# Patient Record
Sex: Male | Born: 1960 | Race: White | Hispanic: No | Marital: Single | Smoking: Former smoker
Health system: Southern US, Community
[De-identification: ages and names within clinical notes are randomized; demographics above are authoritative.]

## PROBLEM LIST (undated history)

## (undated) DIAGNOSIS — N320 Bladder-neck obstruction: Secondary | ICD-10-CM

## (undated) DIAGNOSIS — T7840XA Allergy, unspecified, initial encounter: Secondary | ICD-10-CM

## (undated) DIAGNOSIS — H269 Unspecified cataract: Secondary | ICD-10-CM

## (undated) DIAGNOSIS — I2510 Atherosclerotic heart disease of native coronary artery without angina pectoris: Secondary | ICD-10-CM

## (undated) DIAGNOSIS — H3552 Pigmentary retinal dystrophy: Secondary | ICD-10-CM

## (undated) DIAGNOSIS — I509 Heart failure, unspecified: Secondary | ICD-10-CM

## (undated) DIAGNOSIS — C801 Malignant (primary) neoplasm, unspecified: Secondary | ICD-10-CM

## (undated) DIAGNOSIS — E785 Hyperlipidemia, unspecified: Secondary | ICD-10-CM

## (undated) DIAGNOSIS — I219 Acute myocardial infarction, unspecified: Secondary | ICD-10-CM

## (undated) DIAGNOSIS — J439 Emphysema, unspecified: Secondary | ICD-10-CM

## (undated) DIAGNOSIS — Z5189 Encounter for other specified aftercare: Secondary | ICD-10-CM

## (undated) DIAGNOSIS — I10 Essential (primary) hypertension: Secondary | ICD-10-CM

## (undated) HISTORY — DX: Malignant (primary) neoplasm, unspecified: C80.1

## (undated) HISTORY — DX: Pigmentary retinal dystrophy: H35.52

## (undated) HISTORY — DX: Unspecified cataract: H26.9

## (undated) HISTORY — DX: Emphysema, unspecified: J43.9

## (undated) HISTORY — DX: Allergy, unspecified, initial encounter: T78.40XA

## (undated) HISTORY — DX: Bladder-neck obstruction: N32.0

## (undated) HISTORY — DX: Acute myocardial infarction, unspecified: I21.9

## (undated) HISTORY — PX: CORONARY ANGIOPLASTY: SHX604

## (undated) HISTORY — PX: EYE SURGERY: SHX253

## (undated) HISTORY — DX: Encounter for other specified aftercare: Z51.89

## (undated) HISTORY — DX: Hyperlipidemia, unspecified: E78.5

## (undated) HISTORY — DX: Essential (primary) hypertension: I10

## (undated) HISTORY — PX: SPINE SURGERY: SHX786

## (undated) HISTORY — PX: CARDIAC CATHETERIZATION: SHX172

---
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
## 1999-09-08 ENCOUNTER — Encounter: Admission: RE | Admit: 1999-09-08 | Discharge: 1999-09-08 | Payer: Self-pay | Admitting: Gastroenterology

## 1999-09-08 ENCOUNTER — Encounter: Payer: Self-pay | Admitting: Gastroenterology

## 2000-01-20 ENCOUNTER — Observation Stay (HOSPITAL_COMMUNITY): Admission: EM | Admit: 2000-01-20 | Discharge: 2000-01-20 | Payer: Self-pay | Admitting: Emergency Medicine

## 2000-04-22 ENCOUNTER — Ambulatory Visit (HOSPITAL_COMMUNITY): Admission: RE | Admit: 2000-04-22 | Discharge: 2000-04-22 | Payer: Self-pay | Admitting: Gastroenterology

## 2002-03-23 HISTORY — PX: CATARACT EXTRACTION: SUR2

## 2010-06-30 ENCOUNTER — Emergency Department (HOSPITAL_COMMUNITY): Payer: BC Managed Care – HMO

## 2010-06-30 ENCOUNTER — Emergency Department (HOSPITAL_COMMUNITY)
Admission: EM | Admit: 2010-06-30 | Discharge: 2010-06-30 | Disposition: A | Discharge status: Home / Self Care | Payer: BC Managed Care – HMO | Attending: Emergency Medicine | Admitting: Emergency Medicine

## 2010-06-30 DIAGNOSIS — E669 Obesity, unspecified: Secondary | ICD-10-CM | POA: Insufficient documentation

## 2010-06-30 DIAGNOSIS — R079 Chest pain, unspecified: Secondary | ICD-10-CM

## 2010-06-30 DIAGNOSIS — R0602 Shortness of breath: Secondary | ICD-10-CM | POA: Insufficient documentation

## 2010-06-30 DIAGNOSIS — J3489 Other specified disorders of nose and nasal sinuses: Secondary | ICD-10-CM | POA: Insufficient documentation

## 2010-06-30 DIAGNOSIS — R05 Cough: Secondary | ICD-10-CM | POA: Insufficient documentation

## 2010-06-30 DIAGNOSIS — R209 Unspecified disturbances of skin sensation: Secondary | ICD-10-CM | POA: Insufficient documentation

## 2010-06-30 DIAGNOSIS — R059 Cough, unspecified: Secondary | ICD-10-CM | POA: Insufficient documentation

## 2010-06-30 LAB — PROTIME-INR
INR: 0.92 (ref 0.00–1.49)
Prothrombin Time: 12.6 seconds (ref 11.6–15.2)

## 2010-06-30 LAB — CK TOTAL AND CKMB (NOT AT ARMC)
CK, MB: 1.5 ng/mL (ref 0.3–4.0)
Relative Index: 0.5 (ref 0.0–2.5)
Total CK: 329 U/L — ABNORMAL HIGH (ref 7–232)

## 2010-06-30 LAB — COMPREHENSIVE METABOLIC PANEL
ALT: 40 U/L (ref 0–53)
Albumin: 3.7 g/dL (ref 3.5–5.2)
Alkaline Phosphatase: 102 U/L (ref 39–117)
GFR calc Af Amer: >60 mL/min (ref >60)
Potassium: 4.2 mEq/L (ref 3.5–5.1)
Sodium: 137 mEq/L (ref 135–145)
Total Protein: 6.9 g/dL (ref 6.0–8.3)

## 2010-06-30 LAB — POCT CARDIAC MARKERS
CKMB, poc: <1 ng/mL — ABNORMAL LOW (ref 1.0–8.0)
Myoglobin, poc: 77.6 ng/mL (ref 12–200)
Troponin i, poc: <0.05 ng/mL (ref 0.00–0.09)

## 2010-06-30 LAB — CBC
Platelets: 180 10*3/uL (ref 150–400)
RDW: 14.5 % (ref 11.5–15.5)
WBC: 12.2 10*3/uL — ABNORMAL HIGH (ref 4.0–10.5)

## 2010-06-30 LAB — TROPONIN I: Troponin I: 0.02 ng/mL (ref 0.00–0.06)

## 2010-06-30 LAB — APTT: aPTT: 26 seconds (ref 24–37)

## 2010-07-01 NOTE — Consult Note (Addendum)
NAMEMELQUIADES, KOVAR                  ACCOUNT NO.:  [id number]  MEDICAL RECORD NO.:  [id number]           PATIENT TYPE:  E  LOCATION:  [facility]                         FACILITY:  [facility]  PHYSICIAN:  Veverly Fells. Excell Seltzer, MD  DATE OF BIRTH:  1960-11-02  DATE OF CONSULTATION: DATE OF DISCHARGE:  06/30/2010                                CONSULTATION   PRIMARY CARDIOLOGIST:  [facility].  PRIMARY MEDICAL DOCTOR:  [facility].  CHIEF COMPLAINT:  Chest pain.  HISTORY OF PRESENT ILLNESS:  Mr. Novosel is a 50 year old gentleman with no prior known coronary artery disease as well as obesity and high cholesterol who reportedly had a stress test recently in 2008 in [place], [place].  He was told he needed a cath, but never went.  He had a nuclear study in 2001 here at [facility] that was negative with the normal EF.  He presents to [facility] with complaints of somewhat atypical chest pain.  He has had a tightness in the chest for several days, which he initially attributed to his usual bronchitis. Feels the same as his usual allergy symptom, associated with sneezing and coughing, but today he had some numbness and tingling in his left arm.  He has a chest tightness 24 hours a day.  It does not come on with exertion and is not made worst by exertion.  It is not particularly associated with any shortness of breath.  Now, he has a right-sided sharp tight chest discomfort.  EKG is without acute changes.  Cardiac enzymes are negative x1.  PAST MEDICAL HISTORY: 1. 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 2. Obesity. 3. Seasonal allergies. 4. Hyperlipidemia. 5. Normal EGD with hiatal hernia in 2002.  OUTPATIENT MEDICATIONS:  Only p.r.n. Tylenol.  ALLERGIES:  No known drug allergies.  SOCIAL HISTORY:  Mr. Cahoon lives in [place].  He is not married and does not have children.  He works as a Building control surveyor.  He has smoked for approximately 25 years in total and  smoked consistently last 10 years.  He drinks rare alcohol.  He is leaving to go on a trip in approximately 10 days and just want to make sure that this chest pain would not keep him from going.  FAMILY HISTORY:  His father had some sort of heart trouble in his late 36s who died of suicide at age 49.  He has one brother in good health. He has grandparents who did have some sort of heart problems.  REVIEW OF SYSTEMS:  No fevers.  Positive for occasional chills, occasional cough, nausea, vomiting, diarrhea, bright red blood per rectum, melena, or hematemesis.  He worked on losing weight over last year having lost 60 pounds, but recently gained about 20 of it back.  RADIOLOGY:  WBC 12.2, hemoglobin 15.7, hematocrit 43.8, platelet count 180.  Sodium 137, potassium 4.8, chloride 105, CO2 25, glucose 86, BUN 8, creatinine 0.82.  LFTs were within normal limits.  PT/INR normal. Cardiac enzymes negative x1.  STUDIES:  Chest x-ray showed no acute disease.  PHYSICAL EXAMINATION:  VITAL SIGNS:  Temperature  98.3, pulse 47-52, respirations 15, blood pressure 93/60, pulse ox 95% on room air. GENERAL:  This is a pleasant white male, in no acute distress. HEENT:  Normocephalic, atraumatic with extraocular movements intact and clear sclera.  Nares are without discharge. NECK:  Supple without carotid bruit. HEART:  Auscultation of heart reveals regular rate and rhythm without murmurs, rubs, or gallops. LUNGS:  Clear to auscultation bilaterally without wheezes, rales, or rhonchi. ABDOMEN:  Soft, nontender, nondistended with positive bowel sounds. EXTREMITIES:  Warm and dry without edema. NEUROLOGIC:  He is alert and oriented x3, responds to questions appropriately with a normal affect.  ASSESSMENT/PLAN:  The patient was seen and examined by Dr. Excell Seltzer.  This is a 50 year old gentleman with a history of hyperlipidemia, tobacco abuse, and obesity who presents with atypical chest pain, which  she relates as a constant left-sided chest pressure initially, which is unchanged with exertion.  It is now right-sided sharp tight discomfort, which he states that the pain he typically feels with the seasonal allergies.  Despite 24 hours of constant pain, his initial enzymes were negative.  EKG is unremarkable.  Cardiac risk factors include tobacco use, obesity, family history of coronary artery disease, and hyperlipidemia.  At this time, we recommended aspirin 81 mg daily as well as repeat his enzymes at 5 p.m. and given the atypical nature of the chest pain, his enzymes were negative, we feel that he can be discharged home with outpatient followup with the stress test in 1 week. This will be prior to he has trip that he is leaving for.  It is tentatively scheduled for July 07, 2010, at 8 a.m. and then, he will have his follow up on Jul 29, 2010, at 9:15 a.m.  If his enzymes should return positive, he will need to be admitted for further evaluation.     Ronie Spies, P.A.C.   ______________________________ Veverly Fells. Excell Seltzer, MD    DD/MEDQ  D:  06/30/2010  T:  07/01/2010  Job:  [id number]  Electronically Signed by Ronie Spies  on 07/01/2010 12:27:45 PM Electronically Signed by Tonny Bollman MD on 07/24/2010 10:36:35 AM

## 2010-07-07 ENCOUNTER — Encounter (HOSPITAL_COMMUNITY): Payer: BC Managed Care – HMO | Admitting: Radiology

## 2010-07-21 ENCOUNTER — Ambulatory Visit (HOSPITAL_COMMUNITY): Payer: BC Managed Care – HMO | Attending: Cardiovascular Disease | Admitting: Radiology

## 2010-07-21 DIAGNOSIS — R0602 Shortness of breath: Secondary | ICD-10-CM

## 2010-07-21 DIAGNOSIS — R079 Chest pain, unspecified: Secondary | ICD-10-CM | POA: Insufficient documentation

## 2010-07-21 MED ORDER — TECHNETIUM TC 99M TETROFOSMIN IV KIT
33.0000 | PACK | Freq: Once | INTRAVENOUS | Status: AC | PRN
  Administered 2010-07-21: 33 via INTRAVENOUS

## 2010-07-21 MED ORDER — REGADENOSON 0.4 MG/5ML IV SOLN
0.4000 mg | Freq: Once | INTRAVENOUS | Status: AC
  Administered 2010-07-21: 0.4 mg via INTRAVENOUS

## 2010-07-21 MED ORDER — TECHNETIUM TC 99M TETROFOSMIN IV KIT
11.0000 | PACK | Freq: Once | INTRAVENOUS | Status: AC | PRN
  Administered 2010-07-21: 11 via INTRAVENOUS

## 2010-07-21 NOTE — Progress Notes (Signed)
[facility] SITE 3 NUCLEAR MED [address] [place] [address] [phone]  Cardiology Nuclear Med Study  Jonathon Cain is a 50 y.o. male [id number] 1960-08-09   Nuclear Med Background Indication for Stress Test:  Evaluation for Ischemia and 06/30/10 Post Hospital: CP, (-) enzymes History:  Myocardial Perfusion Study Cardiac Risk Factors: Family History - CAD, Lipids and Obesity  Symptoms:  Chest Pain, Chest Tightness, DOE, and Palpitations   Nuclear Pre-Procedure Caffeine/Decaff Intake:  None NPO After: 7:00pm   Lungs:  clear IV 0.9% NS with Angio Cath:  20g  IV Site: L Hand  IV Started by:  Stanton Kidney, EMT-P  Chest Size (in):  56 Cup Size: n/a  Height: 6' (1.829 m)  Weight:  294 lb (133.358 kg)  BMI:  Body mass index is 39.87 kg/(m^2). Tech Comments:  NA    Nuclear Med Study 1 or 2 day study: 1 day  Stress Test Type:  Treadmill/Lexiscan  Reading MD: Kristeen Miss, MD  Order Authorizing Provider:  M.Cooper  Resting Radionuclide: Technetium 27m Tetrofosmin  Resting Radionuclide Dose: 11.0 mCi   Stress Radionuclide:  Technetium 21m Tetrofosmin  Stress Radionuclide Dose: 33.0 mCi           Stress Protocol Rest HR:49 Stress HR: 110  Rest BP: 105/54 Stress BP: 153/63  Exercise Time (min): 6:20 METS: 6.10   Predicted Max HR: 169 bpm % Max HR: 65.09 bpm Rate Pressure Product: 46962   Dose of Adenosine (mg):  n/a Dose of Lexiscan: 0.4 mg  Dose of Atropine (mg): n/a Dose of Dobutamine: n/a mcg/kg/min (at max HR)  Stress Test Technologist: Milana Na, EMT-P  Nuclear Technologist:  Domenic Polite, CNMT     Rest Procedure:  Myocardial perfusion imaging was performed at rest 45 minutes following the intravenous administration of Technetium 16m Tetrofosmin. Rest ECG: Sinus Bradycardia  Stress Procedure:  The patient received IV Lexiscan 0.4 mg over 15-seconds with concurrent low level exercise and then Technetium 22m Tetrofosmin was injected  at 30-seconds while the patient continued walking one more minute.  There were non specific changes with Lexiscan.  Quantitative spect images were obtained after a 45-minute delay. Stress ECG: No significant change from baseline ECG  QPS Raw Data Images:  There is a chest  shadow that accounts for the anterior attenuation. Stress Images:  There is a small apical defect with normal uptake in the other regions. Rest Images:  There is a small apical defect with normal uptake in the other regions. Subtraction (SDS):  No evidence of ischemia. Transient Ischemic Dilatation (Normal <1.22):  1.02 Lung/Heart Ratio (Normal <0.45):  0.29  Quantitative Gated Spect Images QGS EDV:  NA   QGS ESV:  NA QGS cine images:  Study not gated QGS EF: Study not gated  Impression Exercise Capacity:  Fair exercise capacity.  Lexiscan was started in stage 2. BP Response:  Normal blood pressure response. Clinical Symptoms:  Mild chest pain/dyspnea. ECG Impression:  No significant ST segment change suggestive of ischemia. Comparison with Prior Nuclear Study: This study is similar to his previous study Jan. 18, 2005.  Overall Impression:  Low risk stress nuclear study.  There is apical thinning that is likely due to chest wall attenuation.  The LV function is normal.  The apex contracts especially well.   Elyn Aquas.

## 2010-07-22 NOTE — Progress Notes (Signed)
ROUTED TO DR. COOPER.Falecha Clark ° °

## 2010-07-23 NOTE — Progress Notes (Signed)
The patient's chest pain has resolved. I reviewed the fact that his Myoview was low risk. He will followup at [facility] family practice as he has no ongoing cardiac issues. He was advised to call at any time if problems arise.

## 2010-07-29 ENCOUNTER — Encounter: Payer: BC Managed Care – HMO | Admitting: Cardiovascular Disease

## 2015-08-08 DIAGNOSIS — Z961 Presence of intraocular lens: Secondary | ICD-10-CM | POA: Insufficient documentation

## 2015-08-08 DIAGNOSIS — H35373 Puckering of macula, bilateral: Secondary | ICD-10-CM | POA: Insufficient documentation

## 2015-08-08 DIAGNOSIS — H469 Unspecified optic neuritis: Secondary | ICD-10-CM | POA: Insufficient documentation

## 2015-08-08 DIAGNOSIS — H53453 Other localized visual field defect, bilateral: Secondary | ICD-10-CM | POA: Insufficient documentation

## 2015-08-08 DIAGNOSIS — H30123 Disseminated chorioretinal inflammation, peripheral, bilateral: Secondary | ICD-10-CM | POA: Insufficient documentation

## 2015-08-08 DIAGNOSIS — H5362 Acquired night blindness: Secondary | ICD-10-CM | POA: Insufficient documentation

## 2015-08-08 DIAGNOSIS — H3552 Pigmentary retinal dystrophy: Secondary | ICD-10-CM | POA: Insufficient documentation

## 2015-08-08 DIAGNOSIS — H43813 Vitreous degeneration, bilateral: Secondary | ICD-10-CM | POA: Insufficient documentation

## 2015-12-11 DIAGNOSIS — H3552 Pigmentary retinal dystrophy: Secondary | ICD-10-CM | POA: Insufficient documentation

## 2016-03-05 DIAGNOSIS — H53453 Other localized visual field defect, bilateral: Secondary | ICD-10-CM | POA: Insufficient documentation

## 2016-10-15 ENCOUNTER — Encounter: Payer: Self-pay | Admitting: Hematology and Oncology

## 2016-10-15 ENCOUNTER — Telehealth: Payer: Self-pay | Admitting: Hematology and Oncology

## 2016-10-15 NOTE — Telephone Encounter (Signed)
Appt has been scheduled for the pt to see Dr. Lebron Conners on 8/7 at 11am. Address and insurance verified. Letter mailed to the pt and faxed to the referring.

## 2016-10-27 ENCOUNTER — Ambulatory Visit (HOSPITAL_BASED_OUTPATIENT_CLINIC_OR_DEPARTMENT_OTHER): Payer: BLUE CROSS/BLUE SHIELD

## 2016-10-27 ENCOUNTER — Telehealth: Payer: Self-pay | Admitting: Hematology and Oncology

## 2016-10-27 ENCOUNTER — Encounter: Payer: Self-pay | Admitting: Hematology and Oncology

## 2016-10-27 ENCOUNTER — Ambulatory Visit (HOSPITAL_BASED_OUTPATIENT_CLINIC_OR_DEPARTMENT_OTHER): Payer: BLUE CROSS/BLUE SHIELD | Admitting: Hematology and Oncology

## 2016-10-27 ENCOUNTER — Other Ambulatory Visit (HOSPITAL_COMMUNITY)
Admission: RE | Admit: 2016-10-27 | Discharge: 2016-10-27 | Disposition: A | Discharge status: Home / Self Care | Payer: BLUE CROSS/BLUE SHIELD | Source: Ambulatory Visit | Attending: Hematology and Oncology | Admitting: Hematology and Oncology

## 2016-10-27 VITALS — BP 145/80 | HR 76 | Temp 99.3°F | Resp 18 | Ht 72.0 in | Wt 299.2 lb

## 2016-10-27 DIAGNOSIS — D72820 Lymphocytosis (symptomatic): Secondary | ICD-10-CM

## 2016-10-27 LAB — CBC & DIFF AND RETIC
BASO%: 0.2 % (ref 0.0–2.0)
Basophils Absolute: 0 10*3/uL (ref 0.0–0.1)
EOS ABS: 0.3 10*3/uL (ref 0.0–0.5)
EOS%: 2.3 % (ref 0.0–7.0)
HCT: 49.6 % (ref 38.4–49.9)
HEMOGLOBIN: 16.6 g/dL (ref 13.0–17.1)
IMMATURE RETIC FRACT: 5.8 % (ref 3.00–10.60)
LYMPH%: 22.8 % (ref 14.0–49.0)
MCH: 30.3 pg (ref 27.2–33.4)
MCHC: 33.5 g/dL (ref 32.0–36.0)
MCV: 90.7 fL (ref 79.3–98.0)
MONO#: 1.5 10*3/uL — AB (ref 0.1–0.9)
MONO%: 10.3 % (ref 0.0–14.0)
NEUT%: 64.4 % (ref 39.0–75.0)
NEUTROS ABS: 9.6 10*3/uL — AB (ref 1.5–6.5)
Platelets: 220 10*3/uL (ref 140–400)
RBC: 5.47 10*6/uL (ref 4.20–5.82)
RDW: 14.5 % (ref 11.0–14.6)
Retic %: 2.22 % — ABNORMAL HIGH (ref 0.80–1.80)
Retic Ct Abs: 121.43 10*3/uL — ABNORMAL HIGH (ref 34.80–93.90)
WBC: 14.9 10*3/uL — AB (ref 4.0–10.3)
lymph#: 3.4 10*3/uL — ABNORMAL HIGH (ref 0.9–3.3)

## 2016-10-27 LAB — COMPREHENSIVE METABOLIC PANEL
ALK PHOS: 149 U/L (ref 40–150)
ALT: 39 U/L (ref 0–55)
ANION GAP: 9 meq/L (ref 3–11)
AST: 30 U/L (ref 5–34)
Albumin: 3.1 g/dL — ABNORMAL LOW (ref 3.5–5.0)
BUN: 6.7 mg/dL — ABNORMAL LOW (ref 7.0–26.0)
CALCIUM: 9.5 mg/dL (ref 8.4–10.4)
CO2: 24 meq/L (ref 22–29)
Chloride: 104 mEq/L (ref 98–109)
Creatinine: 0.8 mg/dL (ref 0.7–1.3)
Glucose: 81 mg/dl (ref 70–140)
Potassium: 3.8 mEq/L (ref 3.5–5.1)
Sodium: 137 mEq/L (ref 136–145)
Total Bilirubin: 0.45 mg/dL (ref 0.20–1.20)
Total Protein: 7.6 g/dL (ref 6.4–8.3)

## 2016-10-27 LAB — LACTATE DEHYDROGENASE: LDH: 161 U/L (ref 125–245)

## 2016-10-27 LAB — URIC ACID: URIC ACID, SERUM: 5.5 mg/dL (ref 2.6–7.4)

## 2016-10-27 NOTE — Telephone Encounter (Signed)
Gave patient avs and calendar for August.   Scheduled lab add-on.

## 2016-10-27 NOTE — Patient Instructions (Signed)
Thank you for choosing [facility] to provide your oncology and hematology care.  To afford each patient quality time with our providers, please arrive 30 minutes before your scheduled appointment time.  If you arrive late for your appointment, you may be asked to reschedule.  We strive to give you quality time with our providers, and arriving late affects you and other patients whose appointments are after yours.   If you are a no show for multiple scheduled visits, you may be dismissed from the clinic at the providers discretion.    Again, thank you for choosing [facility], our hope is that these requests will decrease the amount of time that you wait before being seen by our physicians.  ______________________________________________________________________  Should you have questions after your visit to the [facility], please contact our office at [phone] between the hours of 8:30 and 4:30 p.m.    Voicemails left after 4:30p.m will not be returned until the following business day.    For prescription refill requests, please have your pharmacy contact us directly.  Please also try to allow 48 hours for prescription requests.    Please contact the scheduling department for questions regarding scheduling.  For scheduling of procedures such as PET scans, CT scans, MRI, Ultrasound, etc please contact central scheduling at [phone].    Resources For Cancer Patients and Caregivers:   Oncolink.org:  A wonderful resource for patients and healthcare providers for information regarding your disease, ways to tract your treatment, what to expect, etc.     American Cancer Society:  800-227-2345  Can help patients locate various types of support and financial assistance  Cancer Care: 1-800-813-HOPE (4673) Provides financial assistance, online support groups, medication/co-pay assistance.    [place] DSS:  336-641-3447 Where to apply for food  stamps, Medicaid, and utility assistance  Medicare Rights Center: 800-333-4114 Helps people with Medicare understand their rights and benefits, navigate the Medicare system, and secure the quality healthcare they deserve  SCAT: 336-333-6589  [employer]'s shared-ride transportation service for eligible riders who have a disability that prevents them from riding the fixed route bus.    For additional information on assistance programs please contact our social worker:   Grier Hock/Abigail Elmore:  [phone]            

## 2016-10-30 ENCOUNTER — Encounter: Payer: Self-pay | Admitting: Hematology and Oncology

## 2016-10-30 DIAGNOSIS — D72829 Elevated white blood cell count, unspecified: Secondary | ICD-10-CM | POA: Insufficient documentation

## 2016-10-30 LAB — FLOW CYTOMETRY

## 2016-10-30 NOTE — Progress Notes (Signed)
[facility] New Visit:  Assessment: Lymphocytosis 56 year old male presenting with mild lymphocytic leukocytosis detected in May 2018. Based on limited available lab work, there has been slight increase in the total white blood cell count over the preceding month. Differential is broad, we'll start evaluation by repeating lab work to update the white blood cell count. If lymphocytosis still present, peripheral blood flow cytometry will be helpful in evaluating whether this represents a monoclonal or a polyclonal process.  Plan: --Labs today as below --RTC 1 wk to review  Voice recognition software was used and creation of this note. Despite my best effort at editing the text, some misspelling/errors may have occurred.   Orders Placed This Encounter  Procedures  . CBC & Diff and Retic    Standing Status:   Future    Number of Occurrences:   1    Standing Expiration Date:   10/27/2017  . Lactate dehydrogenase (LDH)    Standing Status:   Future    Number of Occurrences:   1    Standing Expiration Date:   10/27/2017  . Uric acid    Standing Status:   Future    Number of Occurrences:   1    Standing Expiration Date:   10/27/2017  . Comprehensive metabolic panel    Standing Status:   Future    Number of Occurrences:   1    Standing Expiration Date:   10/27/2017  . Flow Cytometry    Standing Status:   Future    Number of Occurrences:   1    Standing Expiration Date:   10/27/2017    All questions were answered.  . The patient knows to call the clinic with any problems, questions or concerns.  This note was electronically signed.    History of Presenting Illness Jonathon Cain 56 y.o. presenting to the [place] for Evaluation of abnormal white blood cell count. Patient was at his baseline of health when he was undergoing evaluation by his primary care provider and normal CBC was discovered as outlined below. Patient acknowledges having some night sweats and early  satiety, but otherwise has been essentially asymptomatic without any change in the tolerance of her activities, appetite, respiratory function, he denies diarrhea, constipation, abdominal pain, nausea, or vomiting.  Oncological/hematological History: --Labs, 06/29/16: WBC 12.2, Hgb 15.7, Plt 180;  --Labs, 08/18/16: WBC 19.7, ALC 3.55, Hgb 16.4, Plt 228; tProt 6.8, Alb 3.7, Ca 9.5, Cr 1.0, AP 139, AST 26, ALT 36; TSH 3.15;    Medical History: Past Medical History:  Diagnosis Date  . Bladder outlet obstruction   . Retinitis pigmentosa     Surgical History: History reviewed. No pertinent surgical history.  Family History: History reviewed. No pertinent family history.  Social History: Social History   Social History  . Marital status: Unknown    Spouse name: N/A  . Number of children: N/A  . Years of education: N/A   Occupational History  . Not on file.   Social History Main Topics  . Smoking status: Current Every Day Smoker    Packs/day: 1.00    Years: 10.00    Types: Cigarettes  . Smokeless tobacco: Never Used  . Alcohol use Yes     Comment: occasional  . Drug use: No  . Sexual activity: Not Currently   Other Topics Concern  . Not on file   Social History Narrative  . No narrative on file    Allergies: No Known  Allergies  Medications:  Current Outpatient Prescriptions  Medication Sig Dispense Refill  . tamsulosin (FLOMAX) 0.4 MG CAPS capsule      No current facility-administered medications for this visit.     Review of Systems: Review of Systems  Constitutional: Positive for appetite change, diaphoresis and fatigue. Negative for fever and unexpected weight change.  HENT:  Negative.   Eyes: Negative.   Respiratory: Negative.   Cardiovascular: Negative.   Gastrointestinal: Negative.   Endocrine: Negative.   Genitourinary: Negative.    Musculoskeletal: Negative.   Skin: Negative.   Neurological: Negative.   Hematological: Negative.    Psychiatric/Behavioral: Negative.      PHYSICAL EXAMINATION Blood pressure (!) 145/80, pulse 76, temperature 99.3 F (37.4 C), temperature source Oral, resp. rate 18, height 6' (1.829 m), weight 299 lb 3.2 oz (135.7 kg), SpO2 99 %.  ECOG PERFORMANCE STATUS: 1 - Symptomatic but completely ambulatory  Physical Exam  Constitutional: He is oriented to person, place, and time and well-developed, well-nourished, and in no distress. No distress.  HENT:  Head: Normocephalic and atraumatic.  Mouth/Throat: Oropharynx is clear and moist. No oropharyngeal exudate.  Eyes: Pupils are equal, round, and reactive to light. EOM are normal. Right eye exhibits no discharge. Left eye exhibits no discharge. No scleral icterus.  Neck: Normal range of motion. Neck supple. No thyromegaly present.  Cardiovascular: Normal rate, regular rhythm and normal heart sounds.   No murmur heard. Pulmonary/Chest: Effort normal and breath sounds normal.  Abdominal: Soft. Bowel sounds are normal. He exhibits no distension and no mass. There is no tenderness. There is no rebound and no guarding.  Musculoskeletal: Normal range of motion. He exhibits no edema.  Lymphadenopathy:    He has no cervical adenopathy.  Neurological: He is alert and oriented to person, place, and time. He has normal reflexes.  Skin: Skin is warm and dry. No rash noted. He is not diaphoretic. No erythema. No pallor.     LABORATORY DATA: I have personally reviewed the data as listed: Appointment on 10/27/2016  Component Date Value Ref Range Status  . WBC 10/27/2016 14.9* 4.0 - 10.3 10e3/uL Final  . NEUT# 10/27/2016 9.6* 1.5 - 6.5 10e3/uL Final  . HGB 10/27/2016 16.6  13.0 - 17.1 g/dL Final  . HCT 10/27/2016 49.6  38.4 - 49.9 % Final  . Platelets 10/27/2016 220  140 - 400 10e3/uL Final  . MCV 10/27/2016 90.7  79.3 - 98.0 fL Final  . MCH 10/27/2016 30.3  27.2 - 33.4 pg Final  . MCHC 10/27/2016 33.5  32.0 - 36.0 g/dL Final  . RBC 10/27/2016 5.47   4.20 - 5.82 10e6/uL Final  . RDW 10/27/2016 14.5  11.0 - 14.6 % Final  . lymph# 10/27/2016 3.4* 0.9 - 3.3 10e3/uL Final  . MONO# 10/27/2016 1.5* 0.1 - 0.9 10e3/uL Final  . Eosinophils Absolute 10/27/2016 0.3  0.0 - 0.5 10e3/uL Final  . Basophils Absolute 10/27/2016 0.0  0.0 - 0.1 10e3/uL Final  . NEUT% 10/27/2016 64.4  39.0 - 75.0 % Final  . LYMPH% 10/27/2016 22.8  14.0 - 49.0 % Final  . MONO% 10/27/2016 10.3  0.0 - 14.0 % Final  . EOS% 10/27/2016 2.3  0.0 - 7.0 % Final  . BASO% 10/27/2016 0.2  0.0 - 2.0 % Final  . Retic % 10/27/2016 2.22* 0.80 - 1.80 % Final  . Retic Ct Abs 10/27/2016 121.43* 34.80 - 93.90 10e3/uL Final  . Immature Retic Fract 10/27/2016 5.80  3.00 - 10.60 %  Final  . LDH 10/27/2016 161  125 - 245 U/L Final  . Uric Acid, Serum 10/27/2016 5.5  2.6 - 7.4 mg/dl Final  . Sodium 10/27/2016 137  136 - 145 mEq/L Final  . Potassium 10/27/2016 3.8  3.5 - 5.1 mEq/L Final  . Chloride 10/27/2016 104  98 - 109 mEq/L Final  . CO2 10/27/2016 24  22 - 29 mEq/L Final  . Glucose 10/27/2016 81  70 - 140 mg/dl Final   Glucose reference range is for nonfasting patients. Fasting glucose reference range is 70- 100.  [name] Kitchen BUN 10/27/2016 6.7* 7.0 - 26.0 mg/dL Final  . Creatinine 10/27/2016 0.8  0.7 - 1.3 mg/dL Final  . Total Bilirubin 10/27/2016 0.45  0.20 - 1.20 mg/dL Final  . Alkaline Phosphatase 10/27/2016 149  40 - 150 U/L Final  . AST 10/27/2016 30  5 - 34 U/L Final  . ALT 10/27/2016 39  0 - 55 U/L Final  . Total Protein 10/27/2016 7.6  6.4 - 8.3 g/dL Final  . Albumin 10/27/2016 3.1* 3.5 - 5.0 g/dL Final  . Calcium 10/27/2016 9.5  8.4 - 10.4 mg/dL Final  . Anion Gap 10/27/2016 9  3 - 11 mEq/L Final  . EGFR 10/27/2016 >90  >90 ml/min/1.73 m2 Final   eGFR is calculated using the CKD-EPI Creatinine Equation (2009)        Ardath Sax, MD

## 2016-10-30 NOTE — Assessment & Plan Note (Signed)
56 year old male presenting with mild lymphocytic leukocytosis detected in May 2018. Based on limited available lab work, there has been slight increase in the total white blood cell count over the preceding month. Differential is broad, we'll start evaluation by repeating lab work to update the white blood cell count. If lymphocytosis still present, peripheral blood flow cytometry will be helpful in evaluating whether this represents a monoclonal or a polyclonal process.  Plan: --Labs today as below --RTC 1 wk to review  Voice recognition software was used and creation of this note. Despite my best effort at editing the text, some misspelling/errors may have occurred.

## 2016-11-05 ENCOUNTER — Ambulatory Visit (HOSPITAL_BASED_OUTPATIENT_CLINIC_OR_DEPARTMENT_OTHER): Payer: BLUE CROSS/BLUE SHIELD

## 2016-11-05 ENCOUNTER — Telehealth: Payer: Self-pay | Admitting: Hematology and Oncology

## 2016-11-05 ENCOUNTER — Ambulatory Visit (HOSPITAL_BASED_OUTPATIENT_CLINIC_OR_DEPARTMENT_OTHER): Payer: BLUE CROSS/BLUE SHIELD | Admitting: Hematology and Oncology

## 2016-11-05 ENCOUNTER — Other Ambulatory Visit: Payer: Self-pay | Admitting: Hematology and Oncology

## 2016-11-05 ENCOUNTER — Encounter: Payer: Self-pay | Admitting: Hematology and Oncology

## 2016-11-05 VITALS — BP 138/65 | HR 64 | Temp 98.6°F | Resp 19 | Ht 72.0 in | Wt 299.1 lb

## 2016-11-05 DIAGNOSIS — D72829 Elevated white blood cell count, unspecified: Secondary | ICD-10-CM

## 2016-11-05 NOTE — Telephone Encounter (Signed)
Scheduled appt per 8/16 los - Gave patient AVS and calender per los.  

## 2016-11-06 NOTE — Assessment & Plan Note (Signed)
56 year old male presenting initially with mild lymphocytic leukocytosis detected in May 2018. Repeat blood work obtained last visit to the clinic demonstrated persistent leukocytosis with neutrophilia, lymphocytosis, and monocytosis. No cytometry revealed no evidence of a monoclonal lymphoid process at this time.  It appears that leukocytosis persistent and now involves several lines of white blood cells. In this setting, there is a stronger possibility of a primary bone marrow process which needs to be further evaluated.  Plan: --BCR/ABL FISH, JAK2 + reflex testing for additional mutations if negative --Consult interventional radiology for a bone marrow biopsy --RTC 4 wk to review

## 2016-11-06 NOTE — Progress Notes (Signed)
[facility] Follow-up Visit:  Assessment: Leukocytosis 56 year old male presenting initially with mild lymphocytic leukocytosis detected in May 2018. Repeat blood work obtained last visit to the clinic demonstrated persistent leukocytosis with neutrophilia, lymphocytosis, and monocytosis. No cytometry revealed no evidence of a monoclonal lymphoid process at this time.  It appears that leukocytosis persistent and now involves several lines of white blood cells. In this setting, there is a stronger possibility of a primary bone marrow process which needs to be further evaluated.  Plan: --BCR/ABL FISH, JAK2 + reflex testing for additional mutations if negative --Consult interventional radiology for a bone marrow biopsy --RTC 4 wk to review   Voice recognition software was used and creation of this note. Despite my best effort at editing the text, some misspelling/errors may have occurred.   Orders Placed This Encounter  Procedures  . CT Biopsy    Standing Status:   Future    Standing Expiration Date:   11/05/2017    Order Specific Question:   Lab orders requested (DO NOT place separate lab orders, these will be automatically ordered during procedure specimen collection):    Answer:   Cytology - Non Pap    Comments:   Cytogenetics and molecular studies as indicated    Order Specific Question:   Lab orders requested (DO NOT place separate lab orders, these will be automatically ordered during procedure specimen collection):    Answer:   Surgical Pathology    Order Specific Question:   Lab orders requested (DO NOT place separate lab orders, these will be automatically ordered during procedure specimen collection):    Answer:   Other    Order Specific Question:   Reason for Exam (SYMPTOM  OR DIAGNOSIS REQUIRED)    Answer:   Leukocytosis, suspecting MDS/MPN    Order Specific Question:   Preferred imaging location?    Answer:   [facility]    Order Specific  Question:   Radiology Contrast Protocol - do NOT remove file path    Answer:   \\[email or web address].pdf  . CT BONE MARROW BIOPSY & ASPIRATION    Standing Status:   Future    Standing Expiration Date:   02/05/2018    Order Specific Question:   Reason for Exam (SYMPTOM  OR DIAGNOSIS REQUIRED)    Answer:   Evaluation for possible myeloproliferative neoplasm    Order Specific Question:   Preferred imaging location?    Answer:   [facility]    Order Specific Question:   Radiology Contrast Protocol - do NOT remove file path    Answer:   \\[email or web address]  . JAK2 (INCLUDING V617F AND EXON 12), MPL,& CALR W/RFL MPN PANEL (NGS)    Standing Status:   Future    Number of Occurrences:   1    Standing Expiration Date:   11/05/2017  . BCR-ABL1 FISH    Standing Status:   Future    Number of Occurrences:   1    Standing Expiration Date:   11/05/2017  . CBC with Differential    Standing Status:   Future    Standing Expiration Date:   11/05/2017  . Comprehensive metabolic panel    Standing Status:   Future    Standing Expiration Date:   11/05/2017  . Lactate dehydrogenase (LDH)    Standing Status:   Future    Standing Expiration Date:   11/05/2017    Cancer Staging No matching staging information was found for the patient.  All  questions were answered.  . The patient knows to call the clinic with any problems, questions or concerns.  This note was electronically signed.    History of Presenting Illness Jonathon Cain is a 56 y.o. male followed in the [facility] for abnormal white blood cell count. Patient was at his baseline of health when he was undergoing evaluation by his primary care provider when an abnormal CBC was discovered as outlined below. Patient acknowledges having some night sweats and early satiety, but otherwise has been essentially asymptomatic without any change in the tolerance of her activities, appetite, respiratory  function, he denies diarrhea, constipation, abdominal pain, nausea, or vomiting.  Patient returns for review of labwork obtained following the last visit to the clinic. Please see the labs are outlined below.  Oncological/hematological History: --Labs, 06/29/16: WBC 12.2, Hgb 15.7, Plt 180;  --Labs, 08/18/16: WBC 19.7, ALC 3.55, Hgb 16.4, Plt 228; tProt 6.8, Alb 3.7, Ca 9.5, Cr 1.0, AP 139, AST 26, ALT 36; TSH 3.15;   --Labs, 10/27/16: WBC 14.9, ANC 9.6, ALC 3.4, Mono 1.5, Hgb 16.6, Plt 220; FlowCyto -- negative for a monoclonal B-cell process   Oncological/hematological History:  No history exists.    Medical History: Past Medical History:  Diagnosis Date  . Bladder outlet obstruction   . Retinitis pigmentosa     Surgical History: History reviewed. No pertinent surgical history.  Family History: History reviewed. No pertinent family history.  Social History: Social History   Social History  . Marital status: Unknown    Spouse name: N/A  . Number of children: N/A  . Years of education: N/A   Occupational History  . Not on file.   Social History Main Topics  . Smoking status: Current Every Day Smoker    Packs/day: 1.00    Years: 10.00    Types: Cigarettes  . Smokeless tobacco: Never Used  . Alcohol use Yes     Comment: occasional  . Drug use: No  . Sexual activity: Not Currently   Other Topics Concern  . Not on file   Social History Narrative  . No narrative on file    Allergies: No Known Allergies  Medications:  No current outpatient prescriptions on file.   No current facility-administered medications for this visit.     Review of Systems: Review of Systems  Constitutional: Positive for appetite change, diaphoresis and fatigue. Negative for fever and unexpected weight change.  HENT:  Negative.   Eyes: Negative.   Respiratory: Negative.   Cardiovascular: Negative.   Gastrointestinal: Negative.   Endocrine: Negative.   Genitourinary: Negative.     Musculoskeletal: Negative.   Skin: Negative.   Neurological: Negative.   Hematological: Negative.   Psychiatric/Behavioral: Negative.      PHYSICAL EXAMINATION Blood pressure 138/65, pulse 64, temperature 98.6 F (37 C), temperature source Oral, resp. rate 19, height 6' (1.829 m), weight 299 lb 1.6 oz (135.7 kg), SpO2 99 %.  ECOG PERFORMANCE STATUS: 1 - Symptomatic but completely ambulatory  Physical Exam  Constitutional: He is oriented to person, place, and time and well-developed, well-nourished, and in no distress. No distress.  HENT:  Head: Normocephalic and atraumatic.  Mouth/Throat: Oropharynx is clear and moist. No oropharyngeal exudate.  Eyes: Pupils are equal, round, and reactive to light. EOM are normal. Right eye exhibits no discharge. Left eye exhibits no discharge. No scleral icterus.  Neck: Normal range of motion. Neck supple. No thyromegaly present.  Cardiovascular: Normal rate, regular rhythm and normal heart sounds.  No murmur heard. Pulmonary/Chest: Effort normal and breath sounds normal.  Abdominal: Soft. Bowel sounds are normal. He exhibits no distension and no mass. There is no tenderness. There is no rebound and no guarding.  Musculoskeletal: Normal range of motion. He exhibits no edema.  Lymphadenopathy:    He has no cervical adenopathy.  Neurological: He is alert and oriented to person, place, and time. He has normal reflexes.  Skin: Skin is warm and dry. No rash noted. He is not diaphoretic. No erythema. No pallor.     LABORATORY DATA: I have personally reviewed the data as listed: No visits with results within 1 Week(s) from this visit.  Latest known visit with results is:  Appointment on 10/27/2016  Component Date Value Ref Range Status  . WBC 10/27/2016 14.9* 4.0 - 10.3 10e3/uL Final  . NEUT# 10/27/2016 9.6* 1.5 - 6.5 10e3/uL Final  . HGB 10/27/2016 16.6  13.0 - 17.1 g/dL Final  . HCT 10/27/2016 49.6  38.4 - 49.9 % Final  . Platelets  10/27/2016 220  140 - 400 10e3/uL Final  . MCV 10/27/2016 90.7  79.3 - 98.0 fL Final  . MCH 10/27/2016 30.3  27.2 - 33.4 pg Final  . MCHC 10/27/2016 33.5  32.0 - 36.0 g/dL Final  . RBC 10/27/2016 5.47  4.20 - 5.82 10e6/uL Final  . RDW 10/27/2016 14.5  11.0 - 14.6 % Final  . lymph# 10/27/2016 3.4* 0.9 - 3.3 10e3/uL Final  . MONO# 10/27/2016 1.5* 0.1 - 0.9 10e3/uL Final  . Eosinophils Absolute 10/27/2016 0.3  0.0 - 0.5 10e3/uL Final  . Basophils Absolute 10/27/2016 0.0  0.0 - 0.1 10e3/uL Final  . NEUT% 10/27/2016 64.4  39.0 - 75.0 % Final  . LYMPH% 10/27/2016 22.8  14.0 - 49.0 % Final  . MONO% 10/27/2016 10.3  0.0 - 14.0 % Final  . EOS% 10/27/2016 2.3  0.0 - 7.0 % Final  . BASO% 10/27/2016 0.2  0.0 - 2.0 % Final  . Retic % 10/27/2016 2.22* 0.80 - 1.80 % Final  . Retic Ct Abs 10/27/2016 121.43* 34.80 - 93.90 10e3/uL Final  . Immature Retic Fract 10/27/2016 5.80  3.00 - 10.60 % Final  . LDH 10/27/2016 161  125 - 245 U/L Final  . Uric Acid, Serum 10/27/2016 5.5  2.6 - 7.4 mg/dl Final  . Sodium 10/27/2016 137  136 - 145 mEq/L Final  . Potassium 10/27/2016 3.8  3.5 - 5.1 mEq/L Final  . Chloride 10/27/2016 104  98 - 109 mEq/L Final  . CO2 10/27/2016 24  22 - 29 mEq/L Final  . Glucose 10/27/2016 81  70 - 140 mg/dl Final   Glucose reference range is for nonfasting patients. Fasting glucose reference range is 70- 100.  [name] Kitchen BUN 10/27/2016 6.7* 7.0 - 26.0 mg/dL Final  . Creatinine 10/27/2016 0.8  0.7 - 1.3 mg/dL Final  . Total Bilirubin 10/27/2016 0.45  0.20 - 1.20 mg/dL Final  . Alkaline Phosphatase 10/27/2016 149  40 - 150 U/L Final  . AST 10/27/2016 30  5 - 34 U/L Final  . ALT 10/27/2016 39  0 - 55 U/L Final  . Total Protein 10/27/2016 7.6  6.4 - 8.3 g/dL Final  . Albumin 10/27/2016 3.1* 3.5 - 5.0 g/dL Final  . Calcium 10/27/2016 9.5  8.4 - 10.4 mg/dL Final  . Anion Gap 10/27/2016 9  3 - 11 mEq/L Final  . EGFR 10/27/2016 >90  >90 ml/min/1.73 m2 Final   eGFR is calculated using the CKD-EPI  Creatinine Equation (2009)  . Flow Cytometry 10/27/2016 See Separate Report   Final       Ardath Sax, MD

## 2016-11-18 ENCOUNTER — Other Ambulatory Visit: Payer: Self-pay | Admitting: Radiology

## 2016-11-19 ENCOUNTER — Other Ambulatory Visit: Payer: Self-pay | Admitting: Radiology

## 2016-11-20 ENCOUNTER — Ambulatory Visit (HOSPITAL_COMMUNITY)
Admission: RE | Admit: 2016-11-20 | Discharge: 2016-11-20 | Disposition: A | Discharge status: Home / Self Care | Payer: BLUE CROSS/BLUE SHIELD | Source: Ambulatory Visit | Attending: Hematology and Oncology | Admitting: Hematology and Oncology

## 2016-11-20 ENCOUNTER — Encounter (HOSPITAL_COMMUNITY): Payer: Self-pay

## 2016-11-20 DIAGNOSIS — H3552 Pigmentary retinal dystrophy: Secondary | ICD-10-CM | POA: Insufficient documentation

## 2016-11-20 DIAGNOSIS — D72829 Elevated white blood cell count, unspecified: Secondary | ICD-10-CM | POA: Insufficient documentation

## 2016-11-20 DIAGNOSIS — D72820 Lymphocytosis (symptomatic): Secondary | ICD-10-CM | POA: Insufficient documentation

## 2016-11-20 DIAGNOSIS — D72821 Monocytosis (symptomatic): Secondary | ICD-10-CM | POA: Diagnosis not present

## 2016-11-20 DIAGNOSIS — F17210 Nicotine dependence, cigarettes, uncomplicated: Secondary | ICD-10-CM | POA: Diagnosis not present

## 2016-11-20 LAB — CBC WITH DIFFERENTIAL/PLATELET
Basophils Absolute: 0 10*3/uL (ref 0.0–0.1)
Basophils Relative: 0 %
EOS ABS: 0.3 10*3/uL (ref 0.0–0.7)
EOS PCT: 2 %
HCT: 46.5 % (ref 39.0–52.0)
Hemoglobin: 16 g/dL (ref 13.0–17.0)
LYMPHS PCT: 21 %
Lymphs Abs: 3.9 10*3/uL (ref 0.7–4.0)
MCH: 30.4 pg (ref 26.0–34.0)
MCHC: 34.4 g/dL (ref 30.0–36.0)
MCV: 88.2 fL (ref 78.0–100.0)
MONO ABS: 1.3 10*3/uL — AB (ref 0.1–1.0)
Monocytes Relative: 7 %
Neutro Abs: 12.8 10*3/uL — ABNORMAL HIGH (ref 1.7–7.7)
Neutrophils Relative %: 70 %
PLATELETS: 248 10*3/uL (ref 150–400)
RBC: 5.27 MIL/uL (ref 4.22–5.81)
RDW: 14.6 % (ref 11.5–15.5)
WBC: 18.4 10*3/uL — AB (ref 4.0–10.5)

## 2016-11-20 LAB — PROTIME-INR
INR: 1
Prothrombin Time: 13.1 seconds (ref 11.4–15.2)

## 2016-11-20 LAB — APTT: aPTT: 29 seconds (ref 24–36)

## 2016-11-20 MED ORDER — MIDAZOLAM HCL 2 MG/2ML IJ SOLN
INTRAMUSCULAR | Status: AC | PRN
  Administered 2016-11-20 (×2): 1 mg via INTRAVENOUS

## 2016-11-20 MED ORDER — MIDAZOLAM HCL 2 MG/2ML IJ SOLN
INTRAMUSCULAR | Status: AC
  Filled 2016-11-20: qty 4

## 2016-11-20 MED ORDER — FENTANYL CITRATE (PF) 100 MCG/2ML IJ SOLN
INTRAMUSCULAR | Status: AC
  Filled 2016-11-20: qty 4

## 2016-11-20 MED ORDER — FENTANYL CITRATE (PF) 100 MCG/2ML IJ SOLN
INTRAMUSCULAR | Status: AC | PRN
  Administered 2016-11-20 (×2): 50 ug via INTRAVENOUS

## 2016-11-20 MED ORDER — SODIUM CHLORIDE 0.9 % IV SOLN
INTRAVENOUS | Status: DC
  Administered 2016-11-20: 10:00:00 via INTRAVENOUS

## 2016-11-20 NOTE — Consult Note (Signed)
Chief Complaint: Patient was seen in consultation today for CT-guided bone marrow biopsy  Referring Physician(s): [place] G  Supervising Physician: Aletta Edouard  Patient Status: [facility] - Out-pt  History of Present Illness: Key Cen is a 56 y.o. male smoker with history of persistent leukocytosis of unknown etiology since May of this year along with neutrophilia, lymphocytosis, and monocytosis .He presents today for CT-guided bone marrow biopsy rule out MDS/MPN.  Past Medical History:  Diagnosis Date  . Bladder outlet obstruction   . Retinitis pigmentosa     No past surgical history on file.  Allergies: Patient has no known allergies.  Medications: Prior to Admission medications   Not on File     No family history on file.  Social History   Social History  . Marital status: Unknown    Spouse name: N/A  . Number of children: N/A  . Years of education: N/A   Social History Main Topics  . Smoking status: Current Every Day Smoker    Packs/day: 1.00    Years: 10.00    Types: Cigarettes  . Smokeless tobacco: Never Used  . Alcohol use Yes     Comment: occasional  . Drug use: No  . Sexual activity: Not Currently   Other Topics Concern  . Not on file   Social History Narrative  . No narrative on file     Review of Systems :currently denies fever, headache, abdominal pain, back pain, nausea, vomiting or bleeding. He does have occasional chest tightness, dyspnea, cough, and night sweats. He is anxious.  Vital Signs: Vitals:   11/20/16 0906  BP: 137/74  Pulse: 63  Resp: 18  Temp: 98 F (36.7 C)  SpO2: 99%      Physical Exam awake, alert. Chest clear to auscultation bilaterally. Heart with regular rate and rhythm. Abdomen soft, obese, positive bowel sounds, nontender. No lower extremity edema.  Mallampati Score:     Imaging: No results found.  Labs:  CBC:  Recent Labs  10/27/16 1158  WBC 14.9*  HGB 16.6  HCT 49.6  PLT  220    COAGS: No results for input(s): INR, APTT in the last 8760 hours.  BMP:  Recent Labs  10/27/16 1158  NA 137  K 3.8  CO2 24  GLUCOSE 81  BUN 6.7*  CALCIUM 9.5  CREATININE 0.8    LIVER FUNCTION TESTS:  Recent Labs  10/27/16 1158  BILITOT 0.45  AST 30  ALT 39  ALKPHOS 149  PROT 7.6  ALBUMIN 3.1*    TUMOR MARKERS: No results for input(s): AFPTM, CEA, CA199, CHROMGRNA in the last 8760 hours.  Assessment and Plan: 56 y.o. male smoker with history of persistent leukocytosis of unknown etiology since May of this year along with neutrophilia, lymphocytosis, and monocytosis .He presents today for CT-guided bone marrow biopsy rule out MDS/MPN.Risks and benefits discussed with the patient including, but not limited to bleeding, infection, damage to adjacent structures or low yield requiring additional tests.All of the patient's questions were answered, patient is agreeable to proceed.Consent signed and in chart. Labs pending.      Thank you for this interesting consult.  I greatly enjoyed meeting Keyvin Rison and look forward to participating in their care.  A copy of this report was sent to the requesting provider on this date.  Electronically Signed: D. Rowe Robert, PA-C 11/20/2016, 9:02 AM   I spent a total of  20 minutes   in face to face in  clinical consultation, greater than 50% of which was counseling/coordinating care for CT-guided bone marrow biopsy

## 2016-11-20 NOTE — Discharge Instructions (Signed)

## 2016-11-20 NOTE — Procedures (Signed)
Interventional Radiology Procedure Note  Procedure: CT guided aspirate and core biopsy of right iliac bone Complications: None Recommendations: - Bedrest supine x 1 hrs - Follow biopsy results  Jenia Klepper T. Andrea Ferrer, M.D Pager:  [phone]   

## 2016-12-03 ENCOUNTER — Encounter: Payer: Self-pay | Admitting: Hematology and Oncology

## 2016-12-03 ENCOUNTER — Telehealth: Payer: Self-pay | Admitting: Hematology and Oncology

## 2016-12-03 ENCOUNTER — Ambulatory Visit (HOSPITAL_BASED_OUTPATIENT_CLINIC_OR_DEPARTMENT_OTHER): Payer: BLUE CROSS/BLUE SHIELD | Admitting: Hematology and Oncology

## 2016-12-03 ENCOUNTER — Other Ambulatory Visit (HOSPITAL_BASED_OUTPATIENT_CLINIC_OR_DEPARTMENT_OTHER): Payer: BLUE CROSS/BLUE SHIELD

## 2016-12-03 VITALS — BP 129/74 | HR 64 | Temp 99.1°F | Resp 19 | Ht 72.0 in | Wt 299.1 lb

## 2016-12-03 DIAGNOSIS — D72829 Elevated white blood cell count, unspecified: Secondary | ICD-10-CM

## 2016-12-03 LAB — CBC WITH DIFFERENTIAL/PLATELET
BASO%: 0.1 % (ref 0.0–2.0)
Basophils Absolute: 0 10*3/uL (ref 0.0–0.1)
EOS ABS: 0.3 10*3/uL (ref 0.0–0.5)
EOS%: 1.9 % (ref 0.0–7.0)
HEMATOCRIT: 47.2 % (ref 38.4–49.9)
HEMOGLOBIN: 15.8 g/dL (ref 13.0–17.1)
LYMPH#: 3 10*3/uL (ref 0.9–3.3)
LYMPH%: 17.6 % (ref 14.0–49.0)
MCH: 30.3 pg (ref 27.2–33.4)
MCHC: 33.5 g/dL (ref 32.0–36.0)
MCV: 90.6 fL (ref 79.3–98.0)
MONO#: 1 10*3/uL — AB (ref 0.1–0.9)
MONO%: 6.1 % (ref 0.0–14.0)
NEUT#: 12.7 10*3/uL — ABNORMAL HIGH (ref 1.5–6.5)
NEUT%: 74.3 % (ref 39.0–75.0)
PLATELETS: 231 10*3/uL (ref 140–400)
RBC: 5.21 10*6/uL (ref 4.20–5.82)
RDW: 15 % — ABNORMAL HIGH (ref 11.0–14.6)
WBC: 17.1 10*3/uL — AB (ref 4.0–10.3)
nRBC: 0 % (ref 0–0)

## 2016-12-03 LAB — COMPREHENSIVE METABOLIC PANEL
ALBUMIN: 2.9 g/dL — AB (ref 3.5–5.0)
ALK PHOS: 149 U/L (ref 40–150)
ALT: 32 U/L (ref 0–55)
ANION GAP: 8 meq/L (ref 3–11)
AST: 24 U/L (ref 5–34)
BUN: 5.8 mg/dL — AB (ref 7.0–26.0)
CALCIUM: 9.2 mg/dL (ref 8.4–10.4)
CHLORIDE: 103 meq/L (ref 98–109)
CO2: 24 mEq/L (ref 22–29)
CREATININE: 0.8 mg/dL (ref 0.7–1.3)
EGFR: >90 mL/min/{1.73_m2} (ref >90)
Glucose: 170 mg/dl — ABNORMAL HIGH (ref 70–140)
POTASSIUM: 3.9 meq/L (ref 3.5–5.1)
Sodium: 136 mEq/L (ref 136–145)
Total Bilirubin: 0.44 mg/dL (ref 0.20–1.20)
Total Protein: 7.1 g/dL (ref 6.4–8.3)

## 2016-12-03 LAB — LACTATE DEHYDROGENASE: LDH: 199 U/L (ref 125–245)

## 2016-12-03 NOTE — Telephone Encounter (Signed)
Gave avs and calendar for September 2019 °

## 2016-12-08 NOTE — Progress Notes (Signed)
Jonathon Cain Follow-up Visit:  Assessment: Leukocytosis 56 year old male followed in the clinic for leukocytosis detected in May 2018. He initially was referred for lymphocytic leukocytosis, but repeat blood work obtained during our initial visit to the clinic demonstrated persistent leukocytosis with neutrophilia, lymphocytosis, and monocytosis. Flow cytometry revealed no evidence of a monoclonal lymphoid process at that time. Due to concern for possible myelodysplasia or myeloproliferative process, she'll testing was obtained. Patient tested negative for presence of BCR/ABL, JAK2, CALR or MPL mutations. Bone marrow biopsy demonstrated mild hypercellularity with neutrophilic and monocytic predominance and no significant left shift. No morphological evidence for myelodysplasia or leukemia presence.  At the present time, my assessment is that we're dealing with reactive neutrophilic/monocytic leukocytosis, most likely due to ongoing tobacco abuse. Due to presence of night sweats and symptoms, I would like to continue follow-up of the patient infrequently.   Plan: --Recommend PCP to obtain CBC/diff at least twice/year and refer patient back to [place] if significant hematological changes are observed --RTC with our clinic in 12 months with labs  Voice recognition software was used and creation of this note. Despite my best effort at editing the text, some misspelling/errors may have occurred.   Orders Placed This Encounter  Procedures  . CBC with Differential    Standing Status:   Future    Standing Expiration Date:   12/03/2017    All questions were answered.  . The patient knows to call the clinic with any problems, questions or concerns.  This note was electronically signed.    History of Presenting Illness Jonathon Cain is a 56 y.o. male followed in the [place] for abnormal white blood cell count. Patient was at his baseline of health when he was undergoing  evaluation by his primary care provider when an abnormal CBC was discovered as outlined below. Patient acknowledges having some night sweats and early satiety, but otherwise has been essentially asymptomatic without any change in the tolerance of her activities, appetite, respiratory function, he denies diarrhea, constipation, abdominal pain, nausea, or vomiting.  Patient returns for review of labwork obtained following the last visit to the clinic. Please see the labs are outlined below.  Oncological/hematological History: --Labs, 06/29/16: WBC 12.2, Hgb 15.7, Plt 180;  --Labs, 08/18/16: WBC 19.7, ALC 3.55, Hgb 16.4, Plt 228; tProt 6.8, Alb 3.7, Ca 9.5, Cr 1.0, AP 139, AST 26, ALT 36; TSH 3.15;   --Labs, 10/27/16: WBC 14.9, ANC 9.6, ALC 3.4, Mono 1.5, Hgb 16.6, Plt 220; FlowCyto -- negative for a monoclonal B-cell process  --Labs, 11/20/16: WBC 18.4, ANC 12.8, ALC 3.9, Mono 1.3, Eos 0.3, Baso 0.0, Hgb 16.0, Plt 248; BCR/ABL FISH -- negative, JAK2, CALR, MPL etc -- negative --BM Bx, 11/20/16: Hypercellular bone marrow with no definitive evidence of myelodysplasia, lymphoproliferative, or myeloproliferative process. Neutrophilia with toxic granulations, occasional myelocyte, and increased monocytes. --Labs, 12/03/16: WBC 17.1, ANC 12.7, ALC 3.0, Mono 1.0, Eos 0.3, Baso 0.0, Hgb 15.8, Plt 231;    Medical History: Past Medical History:  Diagnosis Date  . Bladder outlet obstruction   . Retinitis pigmentosa     Surgical History: Past Surgical History:  Procedure Laterality Date  . CATARACT EXTRACTION Bilateral 2004  . EYE SURGERY    . 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 No pertinent family history.  Social History: Social History   Social History  . Marital status: Unknown    Spouse name: N/A  .  Number of children: N/A  . Years of education: N/A   Occupational History  . Not on file.   Social History Main Topics  .  Smoking status: Current Every Day Smoker    Packs/day: 1.00    Years: 10.00    Types: Cigarettes  . Smokeless tobacco: Never Used  . Alcohol use Yes     Comment: occasional  . Drug use: No  . Sexual activity: Not Currently   Other Topics Concern  . Not on file   Social History Narrative  . No narrative on file    Allergies: No Known Allergies  Medications:  Current Outpatient Prescriptions  Medication Sig Dispense Refill  . cetirizine (ZYRTEC) 5 MG tablet Take 5 mg by mouth daily.     No current facility-administered medications for this visit.     Review of Systems: Review of Systems  Constitutional: Positive for appetite change, diaphoresis and fatigue. Negative for fever and unexpected weight change.  HENT:  Negative.   Eyes: Negative.   Respiratory: Negative.   Cardiovascular: Negative.   Gastrointestinal: Negative.   Endocrine: Negative.   Genitourinary: Negative.    Musculoskeletal: Negative.   Skin: Negative.   Neurological: Negative.   Hematological: Negative.   Psychiatric/Behavioral: Negative.      PHYSICAL EXAMINATION Blood pressure 129/74, pulse 64, temperature 99.1 F (37.3 C), temperature source Oral, resp. rate 19, height 6' (1.829 m), weight 299 lb 1.6 oz (135.7 kg), SpO2 100 %.  ECOG PERFORMANCE STATUS: 1 - Symptomatic but completely ambulatory  Physical Exam  Constitutional: He is oriented to person, place, and time and well-developed, well-nourished, and in no distress. No distress.  HENT:  Head: Normocephalic and atraumatic.  Mouth/Throat: Oropharynx is clear and moist. No oropharyngeal exudate.  Eyes: Pupils are equal, round, and reactive to light. EOM are normal. Right eye exhibits no discharge. Left eye exhibits no discharge. No scleral icterus.  Neck: Normal range of motion. Neck supple. No thyromegaly present.  Cardiovascular: Normal rate, regular rhythm and normal heart sounds.   No murmur heard. Pulmonary/Chest: Effort normal and  breath sounds normal.  Abdominal: Soft. Bowel sounds are normal. He exhibits no distension and no mass. There is no tenderness. There is no rebound and no guarding.  Musculoskeletal: Normal range of motion. He exhibits no edema.  Lymphadenopathy:    He has no cervical adenopathy.  Neurological: He is alert and oriented to person, place, and time. He has normal reflexes.  Skin: Skin is warm and dry. No rash noted. He is not diaphoretic. No erythema. No pallor.     LABORATORY DATA: I have personally reviewed the data as listed: Appointment on 12/03/2016  Component Date Value Ref Range Status  . WBC 12/03/2016 17.1* 4.0 - 10.3 10e3/uL Final  . NEUT# 12/03/2016 12.7* 1.5 - 6.5 10e3/uL Final  . HGB 12/03/2016 15.8  13.0 - 17.1 g/dL Final  . HCT 12/03/2016 47.2  38.4 - 49.9 % Final  . Platelets 12/03/2016 231  140 - 400 10e3/uL Final  . MCV 12/03/2016 90.6  79.3 - 98.0 fL Final  . MCH 12/03/2016 30.3  27.2 - 33.4 pg Final  . MCHC 12/03/2016 33.5  32.0 - 36.0 g/dL Final  . RBC 12/03/2016 5.21  4.20 - 5.82 10e6/uL Final  . RDW 12/03/2016 15.0* 11.0 - 14.6 % Final  . lymph# 12/03/2016 3.0  0.9 - 3.3 10e3/uL Final  . MONO# 12/03/2016 1.0* 0.1 - 0.9 10e3/uL Final  . Eosinophils Absolute 12/03/2016 0.3  0.0 -  0.5 10e3/uL Final  . Basophils Absolute 12/03/2016 0.0  0.0 - 0.1 10e3/uL Final  . NEUT% 12/03/2016 74.3  39.0 - 75.0 % Final  . LYMPH% 12/03/2016 17.6  14.0 - 49.0 % Final  . MONO% 12/03/2016 6.1  0.0 - 14.0 % Final  . EOS% 12/03/2016 1.9  0.0 - 7.0 % Final  . BASO% 12/03/2016 0.1  0.0 - 2.0 % Final  . nRBC 12/03/2016 0  0 - 0 % Final  . Sodium 12/03/2016 136  136 - 145 mEq/L Final  . Potassium 12/03/2016 3.9  3.5 - 5.1 mEq/L Final  . Chloride 12/03/2016 103  98 - 109 mEq/L Final  . CO2 12/03/2016 24  22 - 29 mEq/L Final  . Glucose 12/03/2016 170* 70 - 140 mg/dl Final   Glucose reference range is for nonfasting patients. Fasting glucose reference range is 70- 100.  [name] Kitchen BUN 12/03/2016  5.8* 7.0 - 26.0 mg/dL Final  . Creatinine 12/03/2016 0.8  0.7 - 1.3 mg/dL Final  . Total Bilirubin 12/03/2016 0.44  0.20 - 1.20 mg/dL Final  . Alkaline Phosphatase 12/03/2016 149  40 - 150 U/L Final  . AST 12/03/2016 24  5 - 34 U/L Final  . ALT 12/03/2016 32  0 - 55 U/L Final  . Total Protein 12/03/2016 7.1  6.4 - 8.3 g/dL Final  . Albumin 12/03/2016 2.9* 3.5 - 5.0 g/dL Final  . Calcium 12/03/2016 9.2  8.4 - 10.4 mg/dL Final  . Anion Gap 12/03/2016 8  3 - 11 mEq/L Final  . EGFR 12/03/2016 >90  >90 ml/min/1.73 m2 Final   eGFR is calculated using the CKD-EPI Creatinine Equation (2009)  . LDH 12/03/2016 199  125 - 245 U/L Final       Ardath Sax, MD

## 2016-12-08 NOTE — Assessment & Plan Note (Signed)
56 year old male followed in the clinic for leukocytosis detected in May 2018. He initially was referred for lymphocytic leukocytosis, but repeat blood work obtained during our initial visit to the clinic demonstrated persistent leukocytosis with neutrophilia, lymphocytosis, and monocytosis. Flow cytometry revealed no evidence of a monoclonal lymphoid process at that time. Due to concern for possible myelodysplasia or myeloproliferative process, she'll testing was obtained. Patient tested negative for presence of BCR/ABL, JAK2, CALR or MPL mutations. Bone marrow biopsy demonstrated mild hypercellularity with neutrophilic and monocytic predominance and no significant left shift. No morphological evidence for myelodysplasia or leukemia presence.  At the present time, my assessment is that we're dealing with reactive neutrophilic/monocytic leukocytosis, most likely due to ongoing tobacco abuse. Due to presence of night sweats and symptoms, I would like to continue follow-up of the patient infrequently.   Plan: --Recommend PCP to obtain CBC/diff at least twice/year and refer patient back to [place] if significant hematological changes are observed --RTC with our clinic in 12 months with labs

## 2016-12-15 ENCOUNTER — Encounter (HOSPITAL_COMMUNITY): Payer: Self-pay

## 2016-12-18 LAB — CHROMOSOME ANALYSIS, BONE MARROW

## 2017-08-27 ENCOUNTER — Telehealth: Payer: Self-pay | Admitting: Hematology and Oncology

## 2017-08-27 NOTE — Telephone Encounter (Signed)
Called pt re appts being moved to [facility] - spoke w/ pt - pt did not want appt

## 2017-10-20 DIAGNOSIS — H40043 Steroid responder, bilateral: Secondary | ICD-10-CM | POA: Insufficient documentation

## 2017-11-03 ENCOUNTER — Other Ambulatory Visit: Payer: Self-pay | Admitting: Surgery

## 2017-11-30 ENCOUNTER — Other Ambulatory Visit (HOSPITAL_COMMUNITY): Payer: BLUE CROSS/BLUE SHIELD

## 2017-12-02 ENCOUNTER — Ambulatory Visit: Payer: BLUE CROSS/BLUE SHIELD | Admitting: Adult Health

## 2017-12-02 ENCOUNTER — Other Ambulatory Visit: Payer: BLUE CROSS/BLUE SHIELD

## 2017-12-03 ENCOUNTER — Ambulatory Visit: Payer: BLUE CROSS/BLUE SHIELD | Admitting: Hematology and Oncology

## 2017-12-03 ENCOUNTER — Other Ambulatory Visit: Payer: BLUE CROSS/BLUE SHIELD

## 2017-12-08 ENCOUNTER — Ambulatory Visit: Admit: 2017-12-08 | Payer: BLUE CROSS/BLUE SHIELD | Admitting: Surgery

## 2017-12-08 SURGERY — EXCISION, KELOID
Anesthesia: Choice

## 2018-11-21 LAB — HM COLONOSCOPY

## 2019-07-26 ENCOUNTER — Inpatient Hospital Stay (HOSPITAL_COMMUNITY)
Admission: AD | Admit: 2019-07-26 | Discharge: 2019-07-28 | DRG: 247 | Disposition: A | Discharge status: Home / Self Care | Payer: Medicare HMO | Attending: Cardiology | Admitting: Cardiology

## 2019-07-26 ENCOUNTER — Inpatient Hospital Stay (HOSPITAL_COMMUNITY)
Admission: AD | Disposition: A | Discharge status: Home / Self Care | Payer: Self-pay | Source: Home / Self Care | Attending: Cardiology

## 2019-07-26 DIAGNOSIS — Z955 Presence of coronary angioplasty implant and graft: Secondary | ICD-10-CM

## 2019-07-26 DIAGNOSIS — E7800 Pure hypercholesterolemia, unspecified: Secondary | ICD-10-CM | POA: Diagnosis present

## 2019-07-26 DIAGNOSIS — H548 Legal blindness, as defined in USA: Secondary | ICD-10-CM | POA: Diagnosis present

## 2019-07-26 DIAGNOSIS — I2109 ST elevation (STEMI) myocardial infarction involving other coronary artery of anterior wall: Secondary | ICD-10-CM | POA: Diagnosis present

## 2019-07-26 DIAGNOSIS — H3552 Pigmentary retinal dystrophy: Secondary | ICD-10-CM

## 2019-07-26 DIAGNOSIS — F17210 Nicotine dependence, cigarettes, uncomplicated: Secondary | ICD-10-CM | POA: Diagnosis present

## 2019-07-26 DIAGNOSIS — Z6839 Body mass index (BMI) 39.0-39.9, adult: Secondary | ICD-10-CM | POA: Diagnosis not present

## 2019-07-26 DIAGNOSIS — I255 Ischemic cardiomyopathy: Secondary | ICD-10-CM | POA: Diagnosis present

## 2019-07-26 DIAGNOSIS — Z20822 Contact with and (suspected) exposure to covid-19: Secondary | ICD-10-CM | POA: Diagnosis present

## 2019-07-26 DIAGNOSIS — E785 Hyperlipidemia, unspecified: Secondary | ICD-10-CM | POA: Diagnosis present

## 2019-07-26 HISTORY — PX: CORONARY STENT INTERVENTION: CATH118234

## 2019-07-26 HISTORY — PX: CORONARY/GRAFT ACUTE MI REVASCULARIZATION: CATH118305

## 2019-07-26 HISTORY — PX: LEFT HEART CATH AND CORONARY ANGIOGRAPHY: CATH118249

## 2019-07-26 LAB — COMPREHENSIVE METABOLIC PANEL
ALT: 53 U/L — ABNORMAL HIGH (ref 0–44)
AST: 52 U/L — ABNORMAL HIGH (ref 15–41)
Albumin: 3 g/dL — ABNORMAL LOW (ref 3.5–5.0)
Alkaline Phosphatase: 150 U/L — ABNORMAL HIGH (ref 38–126)
Anion gap: 11 (ref 5–15)
BUN: 8 mg/dL (ref 6–20)
CO2: 22 mmol/L (ref 22–32)
Calcium: 8.7 mg/dL — ABNORMAL LOW (ref 8.9–10.3)
Chloride: 104 mmol/L (ref 98–111)
Creatinine, Ser: 0.9 mg/dL (ref 0.61–1.24)
GFR calc Af Amer: >60 mL/min (ref >60)
GFR calc non Af Amer: >60 mL/min (ref >60)
Glucose, Bld: 122 mg/dL — ABNORMAL HIGH (ref 70–99)
Potassium: 4.3 mmol/L (ref 3.5–5.1)
Sodium: 137 mmol/L (ref 135–145)
Total Bilirubin: 0.8 mg/dL (ref 0.3–1.2)
Total Protein: 6.9 g/dL (ref 6.5–8.1)

## 2019-07-26 LAB — CBC
HCT: 48.1 % (ref 39.0–52.0)
Hemoglobin: 16.3 g/dL (ref 13.0–17.0)
MCH: 30.4 pg (ref 26.0–34.0)
MCHC: 33.9 g/dL (ref 30.0–36.0)
MCV: 89.7 fL (ref 80.0–100.0)
Platelets: 244 10*3/uL (ref 150–400)
RBC: 5.36 MIL/uL (ref 4.22–5.81)
RDW: 14.5 % (ref 11.5–15.5)
WBC: 16.1 10*3/uL — ABNORMAL HIGH (ref 4.0–10.5)
nRBC: 0 % (ref 0.0–0.2)

## 2019-07-26 LAB — RESPIRATORY PANEL BY RT PCR (FLU A&B, COVID)
Influenza A by PCR: NEGATIVE
Influenza B by PCR: NEGATIVE
SARS Coronavirus 2 by RT PCR: NEGATIVE

## 2019-07-26 LAB — LIPID PANEL
Cholesterol: 230 mg/dL — ABNORMAL HIGH (ref 0–200)
HDL: 36 mg/dL — ABNORMAL LOW (ref >40)
LDL Cholesterol: 175 mg/dL — ABNORMAL HIGH (ref 0–99)
Total CHOL/HDL Ratio: 6.4 RATIO
Triglycerides: 95 mg/dL (ref <150)
VLDL: 19 mg/dL (ref 0–40)

## 2019-07-26 LAB — TROPONIN I (HIGH SENSITIVITY): Troponin I (High Sensitivity): 623 ng/L (ref <18)

## 2019-07-26 LAB — PROTIME-INR
INR: 1 (ref 0.8–1.2)
Prothrombin Time: 12.7 seconds (ref 11.4–15.2)

## 2019-07-26 LAB — APTT: aPTT: 28 seconds (ref 24–36)

## 2019-07-26 LAB — HEMOGLOBIN A1C
Hgb A1c MFr Bld: 5.5 % (ref 4.8–5.6)
Mean Plasma Glucose: 111.15 mg/dL

## 2019-07-26 SURGERY — CORONARY/GRAFT ACUTE MI REVASCULARIZATION
Anesthesia: LOCAL

## 2019-07-26 MED ORDER — CHLORHEXIDINE GLUCONATE CLOTH 2 % EX PADS
6.0000 | MEDICATED_PAD | Freq: Every day | CUTANEOUS | Status: DC
  Administered 2019-07-26 – 2019-07-27 (×2): 6 via TOPICAL

## 2019-07-26 MED ORDER — FENTANYL CITRATE (PF) 100 MCG/2ML IJ SOLN
INTRAMUSCULAR | Status: DC | PRN
  Administered 2019-07-26 (×2): 50 ug via INTRAVENOUS
  Administered 2019-07-26: 25 ug via INTRAVENOUS

## 2019-07-26 MED ORDER — TIROFIBAN HCL IN NACL 5-0.9 MG/100ML-% IV SOLN
INTRAVENOUS | Status: AC
  Filled 2019-07-26: qty 100

## 2019-07-26 MED ORDER — MIDAZOLAM HCL 2 MG/2ML IJ SOLN
INTRAMUSCULAR | Status: DC | PRN
  Administered 2019-07-26: 2 mg via INTRAVENOUS

## 2019-07-26 MED ORDER — MIDAZOLAM HCL 2 MG/2ML IJ SOLN
INTRAMUSCULAR | Status: AC
  Filled 2019-07-26: qty 2

## 2019-07-26 MED ORDER — HEPARIN (PORCINE) IN NACL 1000-0.9 UT/500ML-% IV SOLN
INTRAVENOUS | Status: DC | PRN
  Administered 2019-07-26: 500 mL

## 2019-07-26 MED ORDER — IOHEXOL 350 MG/ML SOLN
INTRAVENOUS | Status: DC | PRN
  Administered 2019-07-26: 320 mL

## 2019-07-26 MED ORDER — SODIUM CHLORIDE 0.9% FLUSH
3.0000 mL | Freq: Two times a day (BID) | INTRAVENOUS | Status: DC
  Administered 2019-07-27: 3 mL via INTRAVENOUS

## 2019-07-26 MED ORDER — HEPARIN SODIUM (PORCINE) 1000 UNIT/ML IJ SOLN
INTRAMUSCULAR | Status: AC
  Filled 2019-07-26: qty 1

## 2019-07-26 MED ORDER — TICAGRELOR 90 MG PO TABS
90.0000 mg | ORAL_TABLET | Freq: Two times a day (BID) | ORAL | Status: DC
  Administered 2019-07-27 – 2019-07-28 (×3): 90 mg via ORAL
  Filled 2019-07-26 (×4): qty 1

## 2019-07-26 MED ORDER — TIROFIBAN HCL IN NACL 5-0.9 MG/100ML-% IV SOLN
INTRAVENOUS | Status: AC | PRN
  Administered 2019-07-26 (×2): 0.15 ug/kg/min via INTRAVENOUS

## 2019-07-26 MED ORDER — VERAPAMIL HCL 2.5 MG/ML IV SOLN
INTRAVENOUS | Status: AC
  Filled 2019-07-26: qty 2

## 2019-07-26 MED ORDER — NITROGLYCERIN 0.4 MG SL SUBL
0.4000 mg | SUBLINGUAL_TABLET | SUBLINGUAL | Status: DC | PRN
  Administered 2019-07-26 (×3): 0.4 mg via SUBLINGUAL
  Filled 2019-07-26: qty 1

## 2019-07-26 MED ORDER — IOHEXOL 350 MG/ML SOLN
INTRAVENOUS | Status: AC
  Filled 2019-07-26: qty 1

## 2019-07-26 MED ORDER — ONDANSETRON HCL 4 MG/2ML IJ SOLN: 4.0000 mg | Freq: Four times a day (QID) | INTRAMUSCULAR | Status: DC | PRN

## 2019-07-26 MED ORDER — HYDRALAZINE HCL 20 MG/ML IJ SOLN: 5.0000 mg | INTRAMUSCULAR | Status: AC | PRN

## 2019-07-26 MED ORDER — AMLODIPINE BESYLATE 5 MG PO TABS
2.5000 mg | ORAL_TABLET | Freq: Every day | ORAL | Status: DC
  Administered 2019-07-26 – 2019-07-28 (×3): 2.5 mg via ORAL
  Filled 2019-07-26 (×3): qty 1

## 2019-07-26 MED ORDER — SODIUM CHLORIDE 0.9% FLUSH: 3.0000 mL | INTRAVENOUS | Status: DC | PRN

## 2019-07-26 MED ORDER — ASPIRIN 81 MG PO CHEW
81.0000 mg | CHEWABLE_TABLET | Freq: Every day | ORAL | Status: DC
  Administered 2019-07-26 – 2019-07-28 (×3): 81 mg via ORAL
  Filled 2019-07-26 (×3): qty 1

## 2019-07-26 MED ORDER — HEPARIN SODIUM (PORCINE) 1000 UNIT/ML IJ SOLN
INTRAMUSCULAR | Status: DC | PRN
  Administered 2019-07-26 (×2): 3000 [IU] via INTRAVENOUS
  Administered 2019-07-26: 10000 [IU] via INTRAVENOUS

## 2019-07-26 MED ORDER — ACETAMINOPHEN 325 MG PO TABS
650.0000 mg | ORAL_TABLET | ORAL | Status: DC | PRN
  Administered 2019-07-26 – 2019-07-27 (×2): 650 mg via ORAL
  Filled 2019-07-26 (×3): qty 2

## 2019-07-26 MED ORDER — TICAGRELOR 90 MG PO TABS
ORAL_TABLET | ORAL | Status: DC | PRN
  Administered 2019-07-26: 180 mg via ORAL

## 2019-07-26 MED ORDER — TIROFIBAN (AGGRASTAT) BOLUS VIA INFUSION
INTRAVENOUS | Status: DC | PRN
  Administered 2019-07-26: 3250 ug via INTRAVENOUS

## 2019-07-26 MED ORDER — SODIUM CHLORIDE 0.9 % IV SOLN: 250.0000 mL | INTRAVENOUS | Status: DC | PRN

## 2019-07-26 MED ORDER — LIDOCAINE HCL (PF) 1 % IJ SOLN
INTRAMUSCULAR | Status: AC
  Filled 2019-07-26: qty 30

## 2019-07-26 MED ORDER — SODIUM CHLORIDE 0.9 % WEIGHT BASED INFUSION
1.0000 mL/kg/h | INTRAVENOUS | Status: AC
  Administered 2019-07-26: 1 mL/kg/h via INTRAVENOUS

## 2019-07-26 MED ORDER — FENTANYL CITRATE (PF) 100 MCG/2ML IJ SOLN
INTRAMUSCULAR | Status: AC
  Filled 2019-07-26: qty 2

## 2019-07-26 MED ORDER — HEPARIN (PORCINE) IN NACL 1000-0.9 UT/500ML-% IV SOLN
INTRAVENOUS | Status: AC
  Filled 2019-07-26: qty 1000

## 2019-07-26 MED ORDER — ATORVASTATIN CALCIUM 80 MG PO TABS
80.0000 mg | ORAL_TABLET | Freq: Every day | ORAL | Status: DC
  Administered 2019-07-26 – 2019-07-28 (×3): 80 mg via ORAL
  Filled 2019-07-26 (×4): qty 1

## 2019-07-26 MED ORDER — VERAPAMIL HCL 2.5 MG/ML IV SOLN
INTRAVENOUS | Status: DC | PRN
  Administered 2019-07-26: 10 mL via INTRA_ARTERIAL

## 2019-07-26 MED ORDER — NITROGLYCERIN 1 MG/10 ML FOR IR/CATH LAB
INTRA_ARTERIAL | Status: AC
  Filled 2019-07-26: qty 10

## 2019-07-26 MED ORDER — METOPROLOL SUCCINATE ER 25 MG PO TB24
25.0000 mg | ORAL_TABLET | Freq: Every day | ORAL | Status: DC
  Administered 2019-07-26: 25 mg via ORAL
  Filled 2019-07-26 (×2): qty 1

## 2019-07-26 MED ORDER — NITROGLYCERIN 1 MG/10 ML FOR IR/CATH LAB
INTRA_ARTERIAL | Status: DC | PRN
  Administered 2019-07-26 (×3): 200 ug via INTRACORONARY

## 2019-07-26 SURGICAL SUPPLY — 21 items
BALLN SAPPHIRE 1.5X15 (BALLOONS) ×2
BALLN SAPPHIRE 3.0X15 (BALLOONS) ×4
BALLOON SAPPHIRE 1.5X15 (BALLOONS) IMPLANT
BALLOON SAPPHIRE 3.0X15 (BALLOONS) IMPLANT
CATH OPTITORQUE TIG 4.0 5F (CATHETERS) ×1 IMPLANT
CATH VISTA GUIDE 6FR XB3.5 (CATHETERS) ×1 IMPLANT
DEVICE RAD COMP TR BAND LRG (VASCULAR PRODUCTS) ×1 IMPLANT
ELECT DEFIB PAD ADLT CADENCE (PAD) ×1 IMPLANT
GLIDESHEATH SLEND A-KIT 6F 22G (SHEATH) ×1 IMPLANT
GUIDELINER 6F (CATHETERS) ×1 IMPLANT
GUIDEWIRE INQWIRE 1.5J.035X260 (WIRE) IMPLANT
INQWIRE 1.5J .035X260CM (WIRE) ×2
KIT ENCORE 26 ADVANTAGE (KITS) ×1 IMPLANT
KIT HEART LEFT (KITS) ×2 IMPLANT
PACK CARDIAC CATHETERIZATION (CUSTOM PROCEDURE TRAY) ×2 IMPLANT
STENT SYNERGY XD 3.50X24 (Permanent Stent) IMPLANT
SYNERGY XD 3.50X24 (Permanent Stent) ×2 IMPLANT
TRANSDUCER W/STOPCOCK (MISCELLANEOUS) ×2 IMPLANT
TUBING CIL FLEX 10 FLL-RA (TUBING) ×2 IMPLANT
WIRE ASAHI PROWATER 180CM (WIRE) ×1 IMPLANT
WIRE COUGAR XT STRL 190CM (WIRE) ×1 IMPLANT

## 2019-07-26 NOTE — H&P (Signed)
CARDIOLOGY ADMIT NOTE   Patient ID: Jonathon Cain MRN: [id number] DOB/AGE: January 12, 1961 59 y.o.  Admit date: 07/26/2019 Primary Physician:  Lindie Spruce, MD  Patient ID: Jonathon Cain, male    DOB: February 11, 1961, 59 y.o.   MRN: [id number]  Chest pain  HPI:    Jonathon Cain  is a 59 y.o. 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  In the field he was found to have ST elevations in the anterolateral leads, STEMI was activated and he was emergently brought to [facility] for further evaluation.  Patient still continues to have chest discomfort.   Past Medical History:  Diagnosis Date  . Bladder outlet obstruction   . Retinitis pigmentosa    Past Surgical History:  Procedure Laterality Date  . CATARACT EXTRACTION Bilateral 2004  . CORONARY STENT INTERVENTION N/A 07/26/2019   Procedure: CORONARY STENT INTERVENTION;  Surgeon: Adrian Prows, MD;  Location: [place] [facility];  Service: Cardiovascular;  Laterality: N/A;  . CORONARY/GRAFT ACUTE MI REVASCULARIZATION N/A 07/26/2019   Procedure: CORONARY/GRAFT ACUTE MI REVASCULARIZATION;  Surgeon: Adrian Prows, MD;  Location: [place] [facility];  Service: Cardiovascular;  Laterality: N/A;  . EYE SURGERY    . LEFT HEART CATH AND CORONARY ANGIOGRAPHY N/A 07/26/2019   Procedure: LEFT HEART CATH AND CORONARY ANGIOGRAPHY;  Surgeon: Adrian Prows, MD;  Location: [place] [facility];  Service: Cardiovascular;  Laterality: N/A;  . TUMOR REMOVAL  1961   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   Social History   Socioeconomic History  . Marital status: Unknown    Spouse name: Not on file  . Number of children: Not on file  . Years of education: Not on file  . Highest  education level: Not on file  Occupational History  . Not on file  Tobacco Use  . Smoking status: Current Every Day Smoker    Packs/day: 1.00    Years: 10.00    Pack years: 10.00    Types: Cigarettes  . Smokeless tobacco: Never Used  Substance and Sexual Activity  . Alcohol use: Yes    Comment: occasional  . Drug use: No  . Sexual activity: Not Currently  Other Topics Concern  . Not on file  Social History Narrative  . Not on file   Social Determinants of Health   Financial Resource Strain:   . Difficulty of Paying Living Expenses:   Food Insecurity:   . Worried About Charity fundraiser in the Last Year:   . Arboriculturist in the Last Year:   Transportation Needs:   . Film/video editor (Medical):   [name] Kitchen Lack of Transportation (Non-Medical):   Physical Activity:   . Days of Exercise per Week:   . Minutes of Exercise per Session:   Stress:   . Feeling of Stress :   Social Connections:   . Frequency of Communication with Friends and Family:   . Frequency of Social Gatherings with Friends and Family:   . Attends Religious Services:   . Active Member of Clubs or Organizations:   . Attends Archivist Meetings:   [name] Kitchen Marital Status:   Intimate Partner Violence:   . Fear of Current or Ex-Partner:   .  Emotionally Abused:   [name] Kitchen Physically Abused:   . Sexually Abused:    ROS  Review of Systems  Eyes: Positive for vision loss in left eye and vision loss in right eye.  Cardiovascular: Positive for chest pain and dyspnea on exertion. Negative for leg swelling.  Gastrointestinal: Negative for melena.  All other systems reviewed and are negative.  Objective   Vitals with BMI 07/27/2019 07/27/2019 07/27/2019  Height - - -  Weight - - -  BMI - - -  Systolic [id number] 97  Diastolic 66 72 70  Pulse 54 53 54      Physical Exam  Constitutional: He is oriented to person, place, and time.  Well-built and morbidly obese, no acute distress but appears to be in pain.   Eyes: Conjunctivae are normal.  Cardiovascular: Normal rate, regular rhythm and intact distal pulses. Exam reveals distant heart sounds. Exam reveals no gallop.  No murmur heard. No leg edema, no JVD.  Pulmonary/Chest: Effort normal and breath sounds normal.  Abdominal: Soft. Bowel sounds are normal.  Musculoskeletal:     Cervical back: Neck supple.  Neurological: He is alert and oriented to person, place, and time.  Skin: Skin is warm and dry.  Psychiatric: He has a normal mood and affect.   Laboratory examination:   Recent Labs    07/26/19 1854 07/27/19 0037  NA 137 135  K 4.3 4.5  CL 104 103  CO2 22 23  GLUCOSE 122* 129*  BUN 8 10  CREATININE 0.90 0.84  CALCIUM 8.7* 8.4*  GFRNONAA >60 >60  GFRAA >60 >60   estimated creatinine clearance is 131 mL/min (by C-G formula based on SCr of 0.84 mg/dL).  CMP Latest Ref Rng & Units 07/27/2019 07/26/2019 12/03/2016  Glucose 70 - 99 mg/dL 129(H) 122(H) 170(H)  BUN 6 - 20 mg/dL 10 8 5.8(L)  Creatinine 0.61 - 1.24 mg/dL 0.84 0.90 0.8  Sodium 135 - 145 mmol/L 135 137 136  Potassium 3.5 - 5.1 mmol/L 4.5 4.3 3.9  Chloride 98 - 111 mmol/L 103 104 -  CO2 22 - 32 mmol/L 23 22 24   Calcium 8.9 - 10.3 mg/dL 8.4(L) 8.7(L) 9.2  Total Protein 6.5 - 8.1 g/dL - 6.9 7.1  Total Bilirubin 0.3 - 1.2 mg/dL - 0.8 0.44  Alkaline Phos 38 - 126 U/L - 150(H) 149  AST 15 - 41 U/L - 52(H) 24  ALT 0 - 44 U/L - 53(H) 32   CBC Latest Ref Rng & Units 07/27/2019 07/26/2019 12/03/2016  WBC 4.0 - 10.5 K/uL 23.4(H) 16.1(H) 17.1(H)  Hemoglobin 13.0 - 17.0 g/dL 16.1 16.3 15.8  Hematocrit 39.0 - 52.0 % 49.0 48.1 47.2  Platelets 150 - 400 K/uL 240 244 231   Lipid Panel     Component Value Date/Time   CHOL 230 (H) 07/26/2019 1854   TRIG 95 07/26/2019 1854   HDL 36 (L) 07/26/2019 1854   CHOLHDL 6.4 07/26/2019 1854   VLDL 19 07/26/2019 1854   LDLCALC 175 (H) 07/26/2019 1854   HEMOGLOBIN A1C Lab Results  Component Value Date   HGBA1C 5.5 07/26/2019   MPG 111.15  07/26/2019   TSH No results for input(s): TSH in the last 8760 hours. BNP (last 3 results) No results for input(s): BNP in the last 8760 hours.  Medications and allergies  No Known Allergies   I/O last 3 completed shifts: In: -  Out: 200 [Urine:200] No intake/output data recorded.    Radiology:  CARDIAC CATHETERIZATION  Result Date: 07/26/2019 Left Heart Catheterization 07/26/19: RCA: Mild diffuse disease, distal RCA 15 to 20% stenosis, PDA 50% stenosis. Left main: Mild diffuse disease, 10 to 15%. Circumflex: Large vessel, giving origin to a very large OM1 which is secondary branches.  Ostium of OM1 has 80% stenosis. LAD: Very large caliber vessel, occluded at the proximal end.  Gives origin to high D1 which is large.  Successful PTCA and stenting of the proximal LAD overlapping D1, 3.5 x 24 mm Synergy DES deployed at 12 atmospheric pressure for 60 S. 100% to 0% with TIMI 0 to TIMI-3 flow. Recommendation: Patient will need aggressive risk factor modification, dual antiplatelet therapy with aspirin and Brilinta for a year, will be watched carefully in the intensive care unit for preshock, suspect his EDP is extremely high at the end of the procedure with excessive contrast use.  Will monitor for any contrast nephropathy as well. Fluoro time: 35.7 (min) DAP: [id number] (mGycm2) Cumulative Air Kerma: 2834 (mGy) 320 mL contrast. Extremely difficult procedure with difficult anatomy and large body habitus.   Cardiac Studies:   EKG 07/26/2019 in the field, normal sinus rhythm, ST elevation in V1 to V6 suggestive of acute injury pattern.  Assessment   1.  Acute anterolateral STEMI 2.  Tobacco use disorder 3.  Hyperlipidemia 4.  Morbid obesity  Recommendations:  Patient with ongoing chest pain, abnormal EKG, will proceed with emergent cardiac catheterization.  Will discuss regarding risk factor modification including tobacco use and hyperlipidemia and weight loss.  Further recommendations to  follow following cardiac catheterization. Adrian Prows, MD, [facility] 07/27/2019, 9:22 AM [facility] [place] Pager: [phone] Office: [phone]

## 2019-07-26 NOTE — Progress Notes (Signed)
Patient complaining of aching chest pain which has increased to a 6/10 from 3/10. Patient states pain is located in the mid chest but not radiating and feels the same as what brought him in to the hospital.   Dr. Terri Skains (Cardiology) paged and notified. Orders for STAT EKG and PRN nitroglycerin sublingual tab ordered. Will continue to monitor.

## 2019-07-27 ENCOUNTER — Other Ambulatory Visit: Payer: Self-pay

## 2019-07-27 ENCOUNTER — Encounter (HOSPITAL_COMMUNITY): Payer: Self-pay | Admitting: Cardiology

## 2019-07-27 ENCOUNTER — Inpatient Hospital Stay (HOSPITAL_COMMUNITY): Payer: Medicare HMO

## 2019-07-27 LAB — POCT I-STAT, CHEM 8
BUN: 9 mg/dL (ref 6–20)
Calcium, Ion: 1.19 mmol/L (ref 1.15–1.40)
Chloride: 104 mmol/L (ref 98–111)
Creatinine, Ser: 0.7 mg/dL (ref 0.61–1.24)
Glucose, Bld: 120 mg/dL — ABNORMAL HIGH (ref 70–99)
HCT: 50 % (ref 39.0–52.0)
Hemoglobin: 17 g/dL (ref 13.0–17.0)
Potassium: 4.2 mmol/L (ref 3.5–5.1)
Sodium: 138 mmol/L (ref 135–145)
TCO2: 28 mmol/L (ref 22–32)

## 2019-07-27 LAB — BASIC METABOLIC PANEL
Anion gap: 9 (ref 5–15)
BUN: 10 mg/dL (ref 6–20)
CO2: 23 mmol/L (ref 22–32)
Calcium: 8.4 mg/dL — ABNORMAL LOW (ref 8.9–10.3)
Chloride: 103 mmol/L (ref 98–111)
Creatinine, Ser: 0.84 mg/dL (ref 0.61–1.24)
GFR calc Af Amer: >60 mL/min (ref >60)
GFR calc non Af Amer: >60 mL/min (ref >60)
Glucose, Bld: 129 mg/dL — ABNORMAL HIGH (ref 70–99)
Potassium: 4.5 mmol/L (ref 3.5–5.1)
Sodium: 135 mmol/L (ref 135–145)

## 2019-07-27 LAB — POCT I-STAT 7, (LYTES, BLD GAS, ICA,H+H)
Acid-Base Excess: 1 mmol/L (ref 0.0–2.0)
Bicarbonate: 25.6 mmol/L (ref 20.0–28.0)
Calcium, Ion: 1.17 mmol/L (ref 1.15–1.40)
HCT: 49 % (ref 39.0–52.0)
Hemoglobin: 16.7 g/dL (ref 13.0–17.0)
O2 Saturation: 99 %
Potassium: 4.2 mmol/L (ref 3.5–5.1)
Sodium: 139 mmol/L (ref 135–145)
TCO2: 27 mmol/L (ref 22–32)
pCO2 arterial: 38 mmHg (ref 32.0–48.0)
pH, Arterial: 7.436 (ref 7.350–7.450)
pO2, Arterial: 136 mmHg — ABNORMAL HIGH (ref 83.0–108.0)

## 2019-07-27 LAB — CBC
HCT: 49 % (ref 39.0–52.0)
Hemoglobin: 16.1 g/dL (ref 13.0–17.0)
MCH: 29.7 pg (ref 26.0–34.0)
MCHC: 32.9 g/dL (ref 30.0–36.0)
MCV: 90.2 fL (ref 80.0–100.0)
Platelets: 240 10*3/uL (ref 150–400)
RBC: 5.43 MIL/uL (ref 4.22–5.81)
RDW: 14.6 % (ref 11.5–15.5)
WBC: 23.4 10*3/uL — ABNORMAL HIGH (ref 4.0–10.5)
nRBC: 0 % (ref 0.0–0.2)

## 2019-07-27 LAB — TROPONIN I (HIGH SENSITIVITY)
Troponin I (High Sensitivity): >27000 ng/L (ref <18)
Troponin I (High Sensitivity): >27000 ng/L (ref <18)

## 2019-07-27 LAB — ECHOCARDIOGRAM COMPLETE
Height: 72 in
Weight: 4627.9 oz

## 2019-07-27 LAB — POCT ACTIVATED CLOTTING TIME
Activated Clotting Time: 290 seconds
Activated Clotting Time: 252 seconds

## 2019-07-27 LAB — MRSA PCR SCREENING: MRSA by PCR: NEGATIVE

## 2019-07-27 MED ORDER — PERFLUTREN LIPID MICROSPHERE
INTRAVENOUS | Status: AC
  Filled 2019-07-27: qty 10

## 2019-07-27 MED ORDER — PERFLUTREN LIPID MICROSPHERE
1.0000 mL | INTRAVENOUS | Status: AC | PRN
  Administered 2019-07-27: 2 mL via INTRAVENOUS
  Filled 2019-07-27: qty 10

## 2019-07-27 MED ORDER — IRBESARTAN 150 MG PO TABS
75.0000 mg | ORAL_TABLET | Freq: Every day | ORAL | Status: DC
  Administered 2019-07-27 – 2019-07-28 (×2): 75 mg via ORAL
  Filled 2019-07-27 (×2): qty 1

## 2019-07-27 MED ORDER — FUROSEMIDE 20 MG PO TABS
20.0000 mg | ORAL_TABLET | Freq: Every day | ORAL | Status: DC
  Administered 2019-07-27 – 2019-07-28 (×2): 20 mg via ORAL
  Filled 2019-07-27 (×2): qty 1

## 2019-07-27 MED ORDER — METOPROLOL SUCCINATE ER 50 MG PO TB24: 50.0000 mg | ORAL_TABLET | Freq: Every day | ORAL | Status: DC

## 2019-07-27 MED ORDER — POTASSIUM CHLORIDE CRYS ER 20 MEQ PO TBCR
20.0000 meq | EXTENDED_RELEASE_TABLET | Freq: Every day | ORAL | Status: DC
  Administered 2019-07-27 – 2019-07-28 (×2): 20 meq via ORAL
  Filled 2019-07-27 (×2): qty 2
  Filled 2019-07-27: qty 1
  Filled 2019-07-27: qty 2

## 2019-07-27 NOTE — Plan of Care (Signed)
  Problem: Cardiovascular: Goal: Ability to achieve and maintain adequate cardiovascular perfusion will improve Outcome: Progressing Goal: Vascular access site(s) Level 0-1 will be maintained Outcome: Progressing   Problem: Clinical Measurements: Goal: Will remain free from infection Outcome: Progressing

## 2019-07-27 NOTE — Progress Notes (Signed)
Subjective:  Fells well.  Hot flashes with Brilinta  Intake/Output from previous day:  I/O last 3 completed shifts: In: -  Out: 200 [Urine:200] No intake/output data recorded.  Blood pressure 103/66, pulse (!) 54, temperature 98.9 F (37.2 C), temperature source Oral, resp. rate 19, height 6' (1.829 m), weight 131.2 kg, SpO2 97 %. Physical Exam  Constitutional: He is oriented to person, place, and time.  Well-built and morbidly obese, no acute distress  Eyes: Conjunctivae are normal.  Cardiovascular: Normal rate, regular rhythm and intact distal pulses. Exam reveals distant heart sounds. Exam reveals no gallop.  No murmur heard. No leg edema, no JVD.  Pulmonary/Chest: Effort normal and breath sounds normal.  Abdominal: Soft. Bowel sounds are normal.  Musculoskeletal:     Cervical back: Neck supple.  Neurological: He is alert and oriented to person, place, and time.  Skin: Skin is warm and dry.  Psychiatric: He has a normal mood and affect.    Lab Results: BMP BNP (last 3 results) No results for input(s): BNP in the last 8760 hours.  ProBNP (last 3 results) No results for input(s): PROBNP in the last 8760 hours. BMP Latest Ref Rng & Units 07/27/2019 07/26/2019 12/03/2016  Glucose 70 - 99 mg/dL 129(H) 122(H) 170(H)  BUN 6 - 20 mg/dL 10 8 5.8(L)  Creatinine 0.61 - 1.24 mg/dL 0.84 0.90 0.8  Sodium 135 - 145 mmol/L 135 137 136  Potassium 3.5 - 5.1 mmol/L 4.5 4.3 3.9  Chloride 98 - 111 mmol/L 103 104 -  CO2 22 - 32 mmol/L _0 Calcium 8.9 - 10.3 mg/dL 8.4(L) 8.7(L) 9.2   Hepatic Function Latest Ref Rng & Units 07/26/2019 12/03/2016 10/27/2016  Total Protein 6.5 - 8.1 g/dL 6.9 7.1 7.6  Albumin 3.5 - 5.0 g/dL 3.0(L) 2.9(L) 3.1(L)  AST 15 - 41 U/L 52(H) 24 30  ALT 0 - 44 U/L 53(H) 32 39  Alk Phosphatase 38 - 126 U/L 150(H) 149 149  Total Bilirubin 0.3 - 1.2 mg/dL 0.8 0.44 0.45   CBC Latest Ref Rng & Units 07/27/2019 07/26/2019 12/03/2016  WBC 4.0 - 10.5 K/uL 23.4(H) 16.1(H) 17.1(H)   Hemoglobin 13.0 - 17.0 g/dL 16.1 16.3 15.8  Hematocrit 39.0 - 52.0 % 49.0 48.1 47.2  Platelets 150 - 400 K/uL 240 244 231   Lipid Panel     Component Value Date/Time   CHOL 230 (H) 07/26/2019 1854   TRIG 95 07/26/2019 1854   HDL 36 (L) 07/26/2019 1854   CHOLHDL 6.4 07/26/2019 1854   VLDL 19 07/26/2019 1854   LDLCALC 175 (H) 07/26/2019 1854   Cardiac Panel (last 3 results) No results for input(s): CKTOTAL, CKMB, TROPONINI, RELINDX in the last 72 hours.  HEMOGLOBIN A1C Lab Results  Component Value Date   HGBA1C 5.5 07/26/2019   MPG 111.15 07/26/2019   TSH No results for input(s): TSH in the last 8760 hours. Imaging: CARDIAC CATHETERIZATION  Result Date: 07/26/2019 Left Heart Catheterization 07/26/19: RCA: Mild diffuse disease, distal RCA 15 to 20% stenosis, PDA 50% stenosis. Left main: Mild diffuse disease, 10 to 15%. Circumflex: Large vessel, giving origin to a very large OM1 which is secondary branches.  Ostium of OM1 has 80% stenosis. LAD: Very large caliber vessel, occluded at the proximal end.  Gives origin to high D1 which is large.  Successful PTCA and stenting of the proximal LAD overlapping D1, 3.5 x 24 mm Synergy DES deployed at 12 atmospheric pressure for 60 S. 100% to 0% with  TIMI 0 to TIMI-3 flow. Recommendation: Patient will need aggressive risk factor modification, dual antiplatelet therapy with aspirin and Brilinta for a year, will be watched carefully in the intensive care unit for preshock, suspect his EDP is extremely high at the end of the procedure with excessive contrast use.  Will monitor for any contrast nephropathy as well. Fluoro time: 35.7 (min) DAP: [id number] (mGycm2) Cumulative Air Kerma: 2834 (mGy) 320 mL contrast. Extremely difficult procedure with difficult anatomy and large body habitus.   Cardiac Studies:  EKG 07/27/2019: Sinus bradycardia at rate of 58 bpm, left axis deviation, left anterior fascicular block.  Poor R wave progression, cannot exclude  anterolateral infarct old.  PVCs (couplet)..  No results found for this or any previous visit (from the past 43800 hour(s)).  Scheduled Meds: . amLODipine  2.5 mg Oral Daily  . aspirin  81 mg Oral Daily  . atorvastatin  80 mg Oral Daily  . Chlorhexidine Gluconate Cloth  6 each Topical Daily  . metoprolol succinate  25 mg Oral Daily  . sodium chloride flush  3 mL Intravenous Q12H  . ticagrelor  90 mg Oral BID   Continuous Infusions: . sodium chloride     PRN Meds:.sodium chloride, acetaminophen, nitroGLYCERIN, ondansetron (ZOFRAN) IV, sodium chloride flush  Assessment/Plan:  1.  Acute anterolateral STEMI SP successful but complex PCI to the proximal LAD 2.  Hypercholesterolemia 3.  Tobacco use disorder 4.  Morbid obesity  Recommendation: Will increase metoprolol succinate to 50 mg daily although he has bradycardia, suspect severe LV systolic dysfunction.  Echocardiogram pending.  Serum creatinine has remained stable, I will add valsartan 40 mg daily.  Continue amlodipine today that was started for possible microvascular spasm.  Transfer to telemetry, potentially discharge home tomorrow.   Adrian Prows, M.D. 07/27/2019, 9:25 AM [facility] Pager: [phone] Office: [phone] If no answer: [phone]

## 2019-07-27 NOTE — Progress Notes (Signed)
CARDIAC REHAB PHASE I   PRE:  Rate/Rhythm: 54 SB  BP:  Supine: 89/53  Sitting: 98/71  Standing:    SaO2: 97%RA  MODE:  Ambulation: 150 ft   POST:  Rate/Rhythm: 72 SR  BP:  Supine:   Sitting: 109/69  Standing:    SaO2: 98%RA 1305-1427 Pt walked 150 ft with fairly steady gait. I tried to stand in front of pt walking so he could see me. No CP. To sitting on side of bed after walk. Tolerated well. Sats good on RA. No dizziness. MI education completed with pt who voiced understanding. Also discussed low sodium and daily weights with low EF. Discussed importance of brilinta with stent, NTG use, MI restrictions, heart healthy and low sodium food choices, smoking cessation and CRP 2. Pt stated he quit smoking in past with Wellbutrin and keeping busy. With limited vision pt stated he has good days and bad days. Some days he can do more than others depending on vision. Referred to [place] CRP 2. Pt would need transportation assistance as he cannot drive. He will think about program. He stated he used to love to go to gym but increased HR affected vision.    Graylon Good, RN BSN  07/27/2019 2:14 PM

## 2019-07-27 NOTE — Plan of Care (Signed)

## 2019-07-27 NOTE — Progress Notes (Signed)
   07/27/19 1000  Clinical Encounter Type  Visited With Patient  Visit Type Initial  Referral From [facility] responded to consult for AD. Chaplain delivered paperwork. Notary not available until Monday May 10th 2021. Chaplains remain available for support as needs arise.   Chaplain Resident, Evelene Croon, M Div [phone] on-call pager

## 2019-07-27 NOTE — Progress Notes (Signed)
  Echocardiogram 2D Echocardiogram has been performed.  Jonathon Cain 07/27/2019, 12:54 PM

## 2019-07-27 NOTE — TOC Benefit Eligibility Note (Signed)
Transition of Care [facility]) Benefit Eligibility Note    Patient Details  Name: Jonathon Cain MRN: [id number] Date of Birth: 01-26-1961   Medication/Dose: [name]  90 MFG BID  Covered?: Yes  Tier: 3 Drug  Prescription Coverage Preferred Pharmacy: [facility] with Person/Company/Phone Number:: [id number]  @ [employer] # [phone]  Co-Pay: $ 45.00  Prior Approval: No  Deductible: Met  Additional Notes: Q/L TWO PILL PER DAY    Memory Argue Phone Number: 07/27/2019, 10:49 AM

## 2019-07-27 NOTE — Progress Notes (Signed)
Pt arrived from [facility] after dx of STEMI, cardiac cath w/stent placement to proximal LAD.  Telemetry monitor applied and CCMD notified.  CHG bath and skin assessment completed.  Pt reports being pain free currently.  Pt oriented to unit and room to include call light and phone.  Will continue to monitor.

## 2019-07-28 LAB — BASIC METABOLIC PANEL
Anion gap: 8 (ref 5–15)
BUN: 9 mg/dL (ref 6–20)
CO2: 24 mmol/L (ref 22–32)
Calcium: 8.3 mg/dL — ABNORMAL LOW (ref 8.9–10.3)
Chloride: 101 mmol/L (ref 98–111)
Creatinine, Ser: 0.93 mg/dL (ref 0.61–1.24)
GFR calc Af Amer: >60 mL/min (ref >60)
GFR calc non Af Amer: >60 mL/min (ref >60)
Glucose, Bld: 122 mg/dL — ABNORMAL HIGH (ref 70–99)
Potassium: 4.7 mmol/L (ref 3.5–5.1)
Sodium: 133 mmol/L — ABNORMAL LOW (ref 135–145)

## 2019-07-28 MED ORDER — ASPIRIN 81 MG PO CHEW: 81.0000 mg | CHEWABLE_TABLET | Freq: Every day | ORAL | 11 refills | Status: DC

## 2019-07-28 MED ORDER — FUROSEMIDE 20 MG PO TABS: 20.0000 mg | ORAL_TABLET | Freq: Every day | ORAL | 1 refills | Status: DC

## 2019-07-28 MED ORDER — NITROGLYCERIN 0.4 MG SL SUBL: 0.4000 mg | SUBLINGUAL_TABLET | SUBLINGUAL | 2 refills | Status: DC | PRN

## 2019-07-28 MED ORDER — TICAGRELOR 90 MG PO TABS: 90.0000 mg | ORAL_TABLET | Freq: Two times a day (BID) | ORAL | 11 refills | Status: DC

## 2019-07-28 MED ORDER — ACETAMINOPHEN 325 MG PO TABS: 650.0000 mg | ORAL_TABLET | ORAL | Status: DC | PRN

## 2019-07-28 MED ORDER — ATORVASTATIN CALCIUM 80 MG PO TABS: 80.0000 mg | ORAL_TABLET | Freq: Every day | ORAL | 3 refills | Status: DC

## 2019-07-28 MED ORDER — ENTRESTO 24-26 MG PO TABS: 1.0000 | ORAL_TABLET | Freq: Two times a day (BID) | ORAL | 1 refills | Status: DC

## 2019-07-28 MED ORDER — METOPROLOL SUCCINATE ER 50 MG PO TB24: 50.0000 mg | ORAL_TABLET | Freq: Every day | ORAL | 1 refills | Status: DC

## 2019-07-28 MED FILL — METOPROLOL SUCCINATE ER 50: 50 | 30 days supply | Qty: 30 | Fill #0

## 2019-07-28 MED FILL — ENTRESTO 24 MG-26 MG TABLET: 24-26 | 30 days supply | Qty: 60 | Fill #0

## 2019-07-28 MED FILL — FUROSEMIDE 20 MG TAB: 20 | 30 days supply | Qty: 30 | Fill #0

## 2019-07-28 MED FILL — ATORVASTATIN CALCIUM 80 MG: 80 | 30 days supply | Qty: 30 | Fill #0

## 2019-07-28 MED FILL — ASPIRIN LOW DOSE 81 MG CHEW: 81 | 30 days supply | Qty: 30 | Fill #0

## 2019-07-28 MED FILL — NITROGLYCERIN 0.4 MG TAB SL: 0.4 | 8 days supply | Qty: 25 | Fill #0

## 2019-07-28 MED FILL — BRILINTA 90 MG TABLET: 90 | 30 days supply | Qty: 60 | Fill #0

## 2019-07-28 NOTE — Discharge Summary (Signed)
Physician Discharge Summary  Patient ID: Jonathon Cain MRN: [id number] DOB/AGE: 08-11-1960 59 y.o.  Admit date: 07/26/2019 Discharge date: 07/29/2019  Primary Discharge Diagnosis Acute extensive anterior STEMI Ischemic cardiomyopathy with severe LV systolic dysfunction Hypercholesterolemia Tobacco use disorder Morbid obesity Legally blind  Significant Diagnostic Studies: EKG 07/27/2019: Sinus bradycardia at rate of 58 bpm, left axis deviation, left anterior fascicular block.  Poor R wave progression, cannot exclude anterolateral infarct old.  PVCs (couplet).  Left Heart Catheterization 07/26/19:  RCA: Mild diffuse disease, distal RCA 15 to 20% stenosis, PDA 50% stenosis. Left main: Mild diffuse disease, 10 to 15%. Circumflex: Large vessel, giving origin to a very large OM1 which is secondary branches.  Ostium of OM1 has 80% stenosis. LAD: Very large caliber vessel, occluded at the proximal end.  Gives origin to high D1 which is large.  Successful PTCA and stenting of the proximal LAD overlapping D1, 3.5 x 24 mm Synergy DES deployed at 12 atmospheric pressure for 60 S. 100% to 0% with TIMI 0 to TIMI-3 flow.  Recommendation: Patient will need aggressive risk factor modification, dual antiplatelet therapy with aspirin and Brilinta for a year, will be watched carefully in the intensive care unit for preshock, suspect his EDP is extremely high at the end of the procedure with excessive contrast use.  Will monitor for any contrast nephropathy as well.  Echocardiogram 07/27/2019: 1. Left ventricular ejection fraction, by estimation, is 20 to 25%. The  left ventricle has severely decreased function. The left ventricle demonstrates regional wall motion abnormalities (see scoring  diagram/findings for description). Left ventricular  diastolic parameters are consistent with Grade II diastolic dysfunction  (pseudonormalization). Elevated left ventricular end-diastolic pressure.  There is akinesis of  the left ventricular, entire anteroseptal wall,  inferoseptal wall, anterior wall and  apical segment.  2. Right ventricular systolic function is normal. The right ventricular  size is normal.  3. The mitral valve is normal in structure. Mild mitral valve regurgitation.  4. The aortic valve is normal in structure. Aortic valve regurgitation is not visualized. No aortic stenosis is present.  5. The inferior vena cava is dilated in size with <50% respiratory  variability, suggesting right atrial pressure of 15 mmHg.  Hospital Course: Cillian Vallee  is a 59 y.o. 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  In the field he was found to have ST elevations in the anterolateral leads, STEMI was activated and he was emergently brought to [facility] for further evaluation.  He underwent emergent cardiac catheterization and difficult but successful angioplasty to the proximal LAD with implantation of DES.  He was observed for 2 more days in the hospital, echocardiogram revealing extensive anterolateral akinesis and severe LV systolic dysfunction.  Patient was not on any medications at home, he was started on a beta-blocker, low-dose Entresto, furosemide 20 mg daily for cardiomyopathy and CAD along with continued aspirin and Brilinta and high intensity high-dose atorvastatin.  He was felt stable for discharge after cardiac rehab and walked him without any significant limitations, there was no clinical evidence of heart failure, telemetry did not reveal any significant arrhythmias.  Recommendations on discharge: He will need up titration of Entresto in the outpatient basis, optimization of his  medications,  including beta-blocker and will be continued on aspirin 81 mg daily along with Brilinta 90 mg p.o. twice daily.  He will need repeat echocardiogram in 3 months.  Discharge Exam: Blood pressure (!) 100/56, pulse 75, temperature 98.6 F (37 C), temperature source Oral, resp. rate 18, height 6' (1.829 m), weight 131.2 kg, SpO2 95 %. Body mass index is 39.23 kg/m.  Physical Exam  Constitutional: He is oriented to person, place, and time.  Well-built and morbidly obese, no acute distress  Eyes: Conjunctivae are normal.  Cardiovascular: Normal rate, regular rhythm and intact distal pulses. Exam reveals distant heart sounds. Exam reveals no gallop.  No murmur heard. No leg edema, no JVD.  Pulmonary/Chest: Effort normal and breath sounds normal.  Abdominal: Soft. Bowel sounds are normal.  Musculoskeletal:     Cervical back: Neck supple.  Neurological: He is alert and oriented to person, place, and time.  Skin: Skin is warm and dry.  Psychiatric: He has a normal mood and affect.    Labs:   Lab Results  Component Value Date   WBC 23.4 (H) 07/27/2019   HGB 16.1 07/27/2019   HCT 49.0 07/27/2019   MCV 90.2 07/27/2019   PLT 240 07/27/2019    Recent Labs  Lab 07/26/19 1854 07/26/19 1858 07/28/19 0332  NA 137   < > 133*  K 4.3   < > 4.7  CL 104   < > 101  CO2 22   < > 24  BUN 8   < > 9  CREATININE 0.90   < > 0.93  CALCIUM 8.7*   < > 8.3*  PROT 6.9  --   --   BILITOT 0.8  --   --   ALKPHOS 150*  --   --   ALT 53*  --   --   AST 52*  --   --   GLUCOSE 122*   < > 122*   < > = values in this interval not displayed.   Lipid Panel     Component Value Date/Time   CHOL 230 (H) 07/26/2019 1854   TRIG 95 07/26/2019 1854   HDL 36 (L) 07/26/2019 1854   CHOLHDL 6.4 07/26/2019 1854   VLDL 19 07/26/2019 1854   LDLCALC 175 (H) 07/26/2019 1854   BNP (last 3 results) No results for input(s): BNP in the last 8760 hours.  HEMOGLOBIN A1C Lab Results  Component Value Date   HGBA1C 5.5  07/26/2019   MPG 111.15 07/26/2019    Cardiac Panel (last 3 results)  Ref Range & Units 2 d ago  (07/27/19) 3 d ago  (07/26/19) 3 d ago  (07/26/19)  Troponin I (High Sensitivity) <18 ng/L >27,000High Panic   >27,000High Panic  CM  623High Panic  CM        TSH No results for input(s): TSH in the last 8760 hours.   FOLLOW UP PLANS AND APPOINTMENTS Discharge Instructions    Amb Referral to Cardiac Rehabilitation   Complete by: As directed    Diagnosis:  STEMI Coronary Stents     After initial evaluation and assessments completed: Virtual Based Care may be provided alone or in conjunction with Phase 2 Cardiac Rehab based on patient barriers.: Yes     Allergies as of 07/28/2019   No Known Allergies     Medication List    TAKE these medications   acetaminophen 325 MG tablet Commonly known as: TYLENOL Take 2 tablets (650 mg total) by mouth  every 4 (four) hours as needed for headache or mild pain. Notes to patient: **NEW** For pain. Do not take more than 3000 mg per day    aspirin 81 MG chewable tablet Chew 1 tablet (81 mg total) by mouth daily. Notes to patient: **NEW** To prevent clot and for heart health   atorvastatin 80 MG tablet Commonly known as: LIPITOR Take 1 tablet (80 mg total) by mouth daily. Notes to patient: **NEW** To lower cholesterol   cetirizine 10 MG tablet Commonly known as: ZYRTEC Take 10 mg by mouth daily as needed for allergies. Notes to patient: For allergies    Entresto 24-26 MG Generic drug: sacubitril-valsartan Take 1 tablet by mouth 2 (two) times daily. Notes to patient: **NEW** To treat heart failure   fluticasone 50 MCG/ACT nasal spray Commonly known as: FLONASE Place 2 sprays into both nostrils daily as needed for allergies or rhinitis. Notes to patient: For allergies   furosemide 20 MG tablet Commonly known as: LASIX Take 1 tablet (20 mg total) by mouth daily. Notes to patient: **NEW** To prevent fluid accumulation   latanoprost  0.005 % ophthalmic solution Commonly known as: XALATAN Place 1 drop into both eyes See admin instructions. Place 1 drop into each eye at bedtime for 2 months on/2 months off- alternate with Timolol Notes to patient: To decrease eye pressure   metoprolol succinate 50 MG 24 hr tablet Commonly known as: TOPROL-XL Take 1 tablet (50 mg total) by mouth at bedtime. Take with or immediately following a meal. Notes to patient: **NEW** To lower heart rate   nitroGLYCERIN 0.4 MG SL tablet Commonly known as: NITROSTAT Place 1 tablet (0.4 mg total) under the tongue every 5 (five) minutes as needed for chest pain. Notes to patient: **NEW** For chest pain. Call 911 if not relieved after one dose   ticagrelor 90 MG Tabs tablet Commonly known as: BRILINTA Take 1 tablet (90 mg total) by mouth 2 (two) times daily. Notes to patient: **NEW** To prevent clot and keep heart stents open. VERY IMPORTANT to take twice daily every day    timolol 0.5 % ophthalmic solution Commonly known as: TIMOPTIC Place 1 drop into both eyes See admin instructions. Place 1 drop into both eyes two times a day for 2 months on/2 months off- alternate with Latanoprost Notes to patient: To decrease eye pressure      Follow-up Information    Nigel Mormon, MD Follow up on 08/02/2019.   Specialties: Cardiology, Radiology Why: 11 AM appointment and bring all medications Contact information: [place] [address] [phone]         Adrian Prows, MD 07/29/2019, 8:32 AM  Pager: [phone] Office: [phone] If no answer: [phone]

## 2019-07-28 NOTE — Progress Notes (Signed)
[id number] Went to see pt to see if any questions re ed done yesterday. Gave stent card. Reinforced that pt needs to take brilinta twice daily, smoking cessation, and watching sodium and daily weights. Set up breakfast for pt. Offered to walk after breakfast if pt interested since for discharge. Will follow up if time permits. Graylon Good RN BSN 07/28/2019 9:22 AM

## 2019-07-31 ENCOUNTER — Telehealth (HOSPITAL_COMMUNITY): Payer: Self-pay

## 2019-07-31 NOTE — Telephone Encounter (Signed)
Pt insurance is active and benefits verified through [facility]. Co-pay $10.00, DED $0.00/$0.00 met, out of pocket $3,900.00/$395.00 met, co-insurance 0%. No pre-authorization required. Passport, 07/31/19 @ 2:08PM, VWA#[id number]  Will contact patient to see if he is interested in the Cardiac Rehab Program. If interested, patient will need to complete follow up appt. Once completed, patient will be contacted for scheduling upon review by the RN Navigator.

## 2019-07-31 NOTE — Telephone Encounter (Signed)
Called patient to see if he is interested in the Cardiac Rehab Program. Patient expressed interest. Explained scheduling process and went over insurance, patient verbalized understanding. Will contact patient for scheduling once f/u has been completed.  °

## 2019-08-01 MED FILL — Lidocaine HCl Local Preservative Free (PF) Inj 1%: INTRAMUSCULAR | Qty: 30 | Status: AC

## 2019-08-01 NOTE — Progress Notes (Signed)
Patient referred by Lindie Spruce, MD for CAD  Subjective:   Jonathon Cain, male    DOB: 12/01/1960, 59 y.o.   MRN: [id number]   Chief Complaint  Patient presents with  . Coronary Artery Disease  . Hospitalization Follow-up  . New Patient (Initial Visit)     HPI  59 y.o. 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  He was observed for 2 more days in the hospital, echocardiogram revealing extensive anterolateral akinesis and severe LV systolic dysfunction.  Patient was not on any medications at home, he was started on a beta-blocker, low-dose Entresto, furosemide 20 mg daily for cardiomyopathy and CAD along with continued aspirin and Brilinta and high intensity high-dose atorvastatin.  He was felt stable for discharge after cardiac rehab and walked him without any significant limitations, there was no clinical evidence of heart failure, telemetry did not reveal any significant arrhythmias.  Patient is here for hospital follow up.  Since his hospital discharge, he has not had any episodes of chest pain.  He continues to have exertional dyspnea and orthopnea symptoms.  He is compliant with his medical therapy.  On Sunday, he had several episodes of black-colored loose stools, which have since ceased with the use of Imodium.  He denies any abdominal pain, vomiting.  Family History  Problem Relation Age of Onset  . Heart disease Mother   . Hyperlipidemia Mother   . Asthma Mother   . Heart disease Father   . Hypertension Father   . Heart disease Maternal Grandmother   . Heart disease Maternal Grandfather   . Heart disease Paternal Grandmother   . Heart disease Paternal Grandfather      Current Outpatient Medications on File Prior to Visit  Medication Sig Dispense Refill  . acetaminophen (TYLENOL) 325  MG tablet Take 2 tablets (650 mg total) by mouth every 4 (four) hours as needed for headache or mild pain.    [name] Kitchen aspirin 81 MG chewable tablet Chew 1 tablet (81 mg total) by mouth daily. 30 tablet 11  . atorvastatin (LIPITOR) 80 MG tablet Take 1 tablet (80 mg total) by mouth daily. 30 tablet 3  . cetirizine (ZYRTEC) 10 MG tablet Take 10 mg by mouth daily as needed for allergies.    . fluticasone (FLONASE) 50 MCG/ACT nasal spray Place 2 sprays into both nostrils daily as needed for allergies or rhinitis.     . furosemide (LASIX) 20 MG tablet Take 1 tablet (20 mg total) by mouth daily. 30 tablet 1  . latanoprost (XALATAN) 0.005 % ophthalmic solution Place 1 drop into both eyes See admin instructions. Place 1 drop into each eye at bedtime for 2 months on/2 months off- alternate with Timolol    . metoprolol succinate (TOPROL-XL) 50 MG 24 hr tablet Take 1 tablet (50 mg total) by mouth at bedtime. Take with or immediately following a meal. 30 tablet 1  . nitroGLYCERIN (NITROSTAT) 0.4 MG SL tablet Place 1 tablet (0.4 mg total) under the tongue every 5 (five) minutes as needed for chest pain. 25 tablet 2  . sacubitril-valsartan (ENTRESTO) 24-26 MG Take 1 tablet by mouth 2 (two) times daily. 60 tablet 1  . ticagrelor (BRILINTA) 90 MG TABS tablet Take 1 tablet (90 mg total) by mouth 2 (two) times daily. 60 tablet 11  . timolol (TIMOPTIC) 0.5 % ophthalmic solution  Place 1 drop into both eyes See admin instructions. Place 1 drop into both eyes two times a day for 2 months on/2 months off- alternate with Latanoprost     No current facility-administered medications on file prior to visit.    Cardiovascular and other pertinent studies:  EKG 08/02/2019: Sinus rhythm 52 bpm. Extensive anterolateral infarct, IVCD.   Echocardiogram 07/27/2019: 1. Left ventricular ejection fraction, by estimation, is 20 to 25%. The  left ventricle has severely decreased function. The left ventricle  demonstrates regional wall  motion abnormalities (see scoring  diagram/findings for description). Left ventricular  diastolic parameters are consistent with Grade II diastolic dysfunction  (pseudonormalization). Elevated left ventricular end-diastolic pressure.  There is akinesis of the left ventricular, entire anteroseptal wall,  inferoseptal wall, anterior wall and  apical segment.  2. Right ventricular systolic function is normal. The right ventricular  size is normal.  3. The mitral valve is normal in structure. Mild mitral valve  regurgitation.  4. The aortic valve is normal in structure. Aortic valve regurgitation is  not visualized. No aortic stenosis is present.  5. The inferior vena cava is dilated in size with <50% respiratory  variability, suggesting right atrial pressure of 15 mmHg.   Left Heart Catheterization 07/26/19 (Dr. Einar Gip):  LM: Mild diffuse disease LAD: Very large caliber vessel, occluded at the proximal end.  Gives origin to high D1 which is large.  Successful PTCA and stenting of the proximal LAD overlapping D1, 3.5 x 24 mm Synergy DES deployed at 12 atmospheric pressure for 60 S. 100% to 0% with TIMI 0 to TIMI-3 flow. LCx: Large vessel, giving origin to a very large OM1 which is secondary branches.  Ostium of OM1 has 80% stenosis. RCA: Mild diffuse disease, distal RCA 15 to 20% stenosis, PDA 50% stenosis.  Recommendation: Patient will need aggressive risk factor modification, dual antiplatelet therapy with aspirin and Brilinta for a year, will be watched carefully in the intensive care unit for preshock, suspect his EDP is extremely high at the end of the procedure with excessive contrast use.  Will monitor for any contrast nephropathy as well.  Recent labs: 07/26/2019-07/28/2019: Glucose 122, BUN/Cr 9/0.93. EGFR >60. Na/K 133/4.7. Rest of the CMP normal H/H 16.1/49.0. MCV 90.2. Platelets 240 HbA1C 5.5 % Chol 330, TG 95, HDL 36, LDL 175   Review of Systems  Cardiovascular: Positive  for dyspnea on exertion and orthopnea. Negative for chest pain, leg swelling, palpitations and syncope.  Gastrointestinal: Positive for diarrhea and melena.         Vitals:   08/02/19 1114  BP: (!) 94/50  Pulse: (!) 51  Resp: 17  Temp: (!) 97.2 F (36.2 C)  SpO2: 97%     Body mass index is 39.28 kg/m. Filed Weights   08/02/19 1114  Weight: 289 lb 9.6 oz (131.4 kg)     Objective:   Physical Exam  Constitutional: No distress.  Neck: No JVD present.  Cardiovascular: Normal rate, regular rhythm, normal heart sounds and intact distal pulses.  No murmur heard. Pulmonary/Chest: Effort normal and breath sounds normal. He has no wheezes. He has no rales.  Musculoskeletal:        General: Edema (Trace B/L) present.  Nursing note and vitals reviewed.        Assessment & Recommendations:   59 y.o. Caucasian male with hyperlipidemia, tobacco dependence, morbid obesity, legal blindness (due to retinitis pigmentosa), with CAD, STEMI treated with primary PCI to prox LAD (07/27/2019), ischemic cardiomyopathy with  EF 20-25%.  CAD: S/p primary PCI to prox LAD. 80% stenosis in large OM1. Continue Aspirin/Brilinta till 07/2020. Continue atorvastatin 80 mg daily. Given concern for melena, I do not recommend nonculprit artery revascularization to large OM1 at this time.  Will check CBC.  Added Protonix for gastric protection.  If possible, would like to avoid interruption of dual antiplatelet therapy in the setting of primary PCI to the proximal LAD.  Ischemic cardiomyopathy: EF 20-25%. Currently on metoprolol succinate 50 mg daily, Entesto 24-26 mg bid.  Due to borderline low blood pressure, I have reduced his metoprolol succinate to 25 mg daily.  Hopefully, this will allow of addition of spironolactone 12.5 mg daily. Recommend wearable defibrillator with plans to repeat echocardiogram in 3 months prior to consideration for ICD. With new onset of post MI heart failure, screened for  EMPACT-MI trial EMPACT-MI: A Study to Test Whether Empagliflozin Can Lower the Risk of Heart Failure and Death in People Who Had a Heart Attack (Myocardial Infarction) Recommend daily weight check.  Repeat blood work in 1 week, including BNP, BMP.  Follow-up in 10-14 days.    Nigel Mormon, MD [facility] Pager: [phone] Office: [phone]

## 2019-08-02 ENCOUNTER — Ambulatory Visit: Payer: Medicare HMO | Admitting: Cardiology

## 2019-08-02 ENCOUNTER — Encounter: Payer: Self-pay | Admitting: Cardiology

## 2019-08-02 ENCOUNTER — Other Ambulatory Visit: Payer: Self-pay

## 2019-08-02 VITALS — BP 94/50 | HR 51 | Temp 97.2°F | Resp 17 | Ht 72.0 in | Wt 289.6 lb

## 2019-08-02 DIAGNOSIS — K921 Melena: Secondary | ICD-10-CM

## 2019-08-02 DIAGNOSIS — I25118 Atherosclerotic heart disease of native coronary artery with other forms of angina pectoris: Secondary | ICD-10-CM | POA: Insufficient documentation

## 2019-08-02 DIAGNOSIS — I5020 Unspecified systolic (congestive) heart failure: Secondary | ICD-10-CM

## 2019-08-02 DIAGNOSIS — I2510 Atherosclerotic heart disease of native coronary artery without angina pectoris: Secondary | ICD-10-CM | POA: Insufficient documentation

## 2019-08-02 MED ORDER — PANTOPRAZOLE SODIUM 40 MG PO TBEC: 40.0000 mg | DELAYED_RELEASE_TABLET | Freq: Every day | ORAL | 2 refills | Status: DC

## 2019-08-02 MED ORDER — SPIRONOLACTONE 25 MG PO TABS: 12.5000 mg | ORAL_TABLET | Freq: Every day | ORAL | 2 refills | Status: DC

## 2019-08-02 MED ORDER — METOPROLOL SUCCINATE ER 25 MG PO TB24: 12.5000 mg | ORAL_TABLET | Freq: Every day | ORAL | 2 refills | Status: DC

## 2019-08-09 NOTE — Progress Notes (Signed)
Patient referred by Lindie Spruce, MD for CAD  Subjective:   Jonathon Cain, male    DOB: 1960/05/05, 59 y.o.   MRN: [id number]   Chief Complaint  Patient presents with  . Coronary Artery Disease  . Follow-up    10-14 DAYS     HPI  59 y.o. 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   He has not had any further black stools. Energy levels remain low, but improving. He has had some episodes of orthopnea.   He is being enrolled in EMPACT-MI clinical trial.Here on 2 week follow up.     Current Outpatient Medications on File Prior to Visit  Medication Sig Dispense Refill  . acetaminophen (TYLENOL) 325 MG tablet Take 2 tablets (650 mg total) by mouth every 4 (four) hours as needed for headache or mild pain.    [name] Kitchen aspirin 81 MG chewable tablet Chew 1 tablet (81 mg total) by mouth daily. 30 tablet 11  . atorvastatin (LIPITOR) 80 MG tablet Take 1 tablet (80 mg total) by mouth daily. 30 tablet 3  . cetirizine (ZYRTEC) 10 MG tablet Take 10 mg by mouth daily as needed for allergies.    . fluticasone (FLONASE) 50 MCG/ACT nasal spray Place 2 sprays into both nostrils daily as needed for allergies or rhinitis.     . furosemide (LASIX) 20 MG tablet Take 1 tablet (20 mg total) by mouth daily. 30 tablet 1  . latanoprost (XALATAN) 0.005 % ophthalmic solution Place 1 drop into both eyes See admin instructions. Place 1 drop into each eye at bedtime for 2 months on/2 months off- alternate with Timolol    . metoprolol succinate (TOPROL XL) 25 MG 24 hr tablet Take 0.5 tablets (12.5 mg total) by mouth daily. 30 tablet 2  . nitroGLYCERIN (NITROSTAT) 0.4 MG SL tablet Place 1 tablet (0.4 mg total) under the tongue every 5 (five) minutes as needed for chest pain. 25 tablet 2  . pantoprazole (PROTONIX) 40 MG tablet Take 1 tablet  (40 mg total) by mouth daily. 30 tablet 2  . sacubitril-valsartan (ENTRESTO) 24-26 MG Take 1 tablet by mouth 2 (two) times daily. 60 tablet 1  . spironolactone (ALDACTONE) 25 MG tablet Take 0.5 tablets (12.5 mg total) by mouth daily. 30 tablet 2  . ticagrelor (BRILINTA) 90 MG TABS tablet Take 1 tablet (90 mg total) by mouth 2 (two) times daily. 60 tablet 11  . timolol (TIMOPTIC) 0.5 % ophthalmic solution Place 1 drop into both eyes See admin instructions. Place 1 drop into both eyes two times a day for 2 months on/2 months off- alternate with Latanoprost     No current facility-administered medications on file prior to visit.    Cardiovascular and other pertinent studies:  EKG 08/02/2019: Sinus rhythm 52 bpm. Extensive anterolateral infarct, IVCD.  Echocardiogram 07/27/2019: 1. Left ventricular ejection fraction, by estimation, is 20 to 25%. The left ventricle  has severely decreased function. The left ventricle demonstrates regional wall  motion abnormalities (see scoring diagram/findings for description).  Left ventricular diastolic parameters are consistent with Grade II diastolic dysfunction  (pseudonormalization). Elevated left ventricular end-diastolic pressure.  There is akinesis of the left ventricular, entire anteroseptal wall,  inferoseptal wall, anterior wall and apical segment.  2. Right ventricular systolic function is normal. The right ventricular size is normal.  3. The mitral valve  is normal in structure. Mild mitral valve regurgitation.  4. The aortic valve is normal in structure. Aortic valve regurgitation is not visualized.  No aortic stenosis is present.  5. The inferior vena cava is dilated in size with <50% respiratory variability,  suggesting right atrial pressure of 15 mmHg.   Left Heart Catheterization 07/26/19 (Dr. Einar Gip):  LM: Mild diffuse disease LAD: Very large caliber vessel, occluded at the proximal end.  Gives origin to high D1 which is large.   Successful PTCA and stenting of the proximal LAD overlapping D1, 3.5 x 24 mm Synergy DES deployed at 12 atmospheric pressure for 60 S. 100% to 0% with TIMI 0 to TIMI-3 flow. LCx: Large vessel, giving origin to a very large OM1 which is secondary branches.  Ostium of OM1 has 80% stenosis. RCA: Mild diffuse disease, distal RCA 15 to 20% stenosis, PDA 50% stenosis.  Recommendation: Patient will need aggressive risk factor modification, dual antiplatelet therapy with aspirin and Brilinta for a year, will be watched carefully in the intensive care unit for preshock, suspect his EDP is extremely high at the end of the procedure with excessive contrast use.  Will monitor for any contrast nephropathy as well.  Recent labs: 07/26/2019: Glucose 122, BUN/Cr 9/0.9. EGFR 60. H/H 16.1/49.0. MCV 90.2. Platelets 240 HbA1C 5.5% Chol 230, TG 95, HDL 36, LDL 175   Review of Systems  Cardiovascular: Positive for dyspnea on exertion and orthopnea. Negative for chest pain, leg swelling, palpitations and syncope.  Gastrointestinal: Positive for diarrhea and melena.         Vitals:   08/11/19 1408  BP: 124/67  Pulse: (!) 50  Resp: 17  SpO2: 97%     Body mass index is 31.1 kg/m. Filed Weights   08/11/19 1408  Weight: 223 lb (101.2 kg)     Objective:   Physical Exam  Constitutional: No distress.  Neck: No JVD present.  Cardiovascular: Normal rate, regular rhythm, normal heart sounds and intact distal pulses.  No murmur heard. Pulmonary/Chest: Effort normal and breath sounds normal. He has no wheezes. He has no rales.  Musculoskeletal:        General: Edema (Trace B/L) present.  Nursing note and vitals reviewed.        Assessment & Recommendations:   59 y.o. 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  CAD: S/p primary PCI to prox LAD. 80% stenosis in large OM1. Continue Aspirin/Brilinta till 07/2020. No melena. Continue atorvastatin 80 mg daily.  Ischemic cardiomyopathy: EF 20-25%. Currently on metoprolol succinate 12.5 mg daily, Entesto 24-26 mg bid. Increased spironolactone to 25 mg daily. Lasix as needed. Conitnue wearable defibrillator with plans to repeat echocardiogram in 3 months prior to consideration for ICD. Enrolled in EMPACT-MI trial EMPACT-MI: A Study to Test Whether Empagliflozin Can Lower the Risk of Heart Failure and Death in People Who Had a Heart Attack (Myocardial Infarction) Needs BMP in 1-2 days.  F?u in 4 weeks   Mac Dowdell Esther Hardy, MD [facility]. [place] Pager: [phone] Office: [phone]

## 2019-08-11 ENCOUNTER — Ambulatory Visit: Payer: Medicare HMO | Admitting: Cardiology

## 2019-08-11 ENCOUNTER — Other Ambulatory Visit: Payer: Self-pay

## 2019-08-11 ENCOUNTER — Encounter: Payer: Self-pay | Admitting: Cardiology

## 2019-08-11 VITALS — BP 124/67 | HR 50 | Resp 17 | Ht 71.0 in | Wt 286.0 lb

## 2019-08-11 DIAGNOSIS — I5020 Unspecified systolic (congestive) heart failure: Secondary | ICD-10-CM

## 2019-08-11 DIAGNOSIS — I25118 Atherosclerotic heart disease of native coronary artery with other forms of angina pectoris: Secondary | ICD-10-CM

## 2019-08-11 LAB — CBC
Hematocrit: 44.4 % (ref 37.5–51.0)
Hemoglobin: 14.8 g/dL (ref 13.0–17.7)
MCH: 30 pg (ref 26.6–33.0)
MCHC: 33.3 g/dL (ref 31.5–35.7)
MCV: 90 fL (ref 79–97)
Platelets: 289 10*3/uL (ref 150–450)
RBC: 4.93 x10E6/uL (ref 4.14–5.80)
RDW: 12.9 % (ref 11.6–15.4)
WBC: 15 10*3/uL — ABNORMAL HIGH (ref 3.4–10.8)

## 2019-08-11 MED ORDER — SPIRONOLACTONE 25 MG PO TABS: 25.0000 mg | ORAL_TABLET | Freq: Every day | ORAL | 2 refills | Status: DC

## 2019-08-12 ENCOUNTER — Encounter: Payer: Self-pay | Admitting: Cardiology

## 2019-08-14 ENCOUNTER — Other Ambulatory Visit: Payer: Self-pay | Admitting: Cardiology

## 2019-08-14 DIAGNOSIS — I5020 Unspecified systolic (congestive) heart failure: Secondary | ICD-10-CM

## 2019-08-16 ENCOUNTER — Telehealth (HOSPITAL_COMMUNITY): Payer: Self-pay

## 2019-08-16 ENCOUNTER — Encounter (HOSPITAL_COMMUNITY): Payer: Self-pay

## 2019-08-16 NOTE — Telephone Encounter (Signed)
Pt returned CR, patient will come in for orientation on 09/19/19 @ 130PM and will attend the 130PM exercise class.  Mailed letter

## 2019-08-16 NOTE — Telephone Encounter (Signed)
Attempted to call patient in regards to Cardiac Rehab - LM on VM Mailed letter 

## 2019-08-17 ENCOUNTER — Other Ambulatory Visit (HOSPITAL_COMMUNITY): Payer: Self-pay | Admitting: Cardiology

## 2019-08-18 LAB — BASIC METABOLIC PANEL
BUN/Creatinine Ratio: 14 (ref 10–24)
BUN: 17 mg/dL (ref 8–27)
CO2: 17 mmol/L — ABNORMAL LOW (ref 20–29)
Calcium: 8.8 mg/dL (ref 8.6–10.2)
Chloride: 105 mmol/L (ref 96–106)
Creatinine, Ser: 1.21 mg/dL (ref 0.76–1.27)
GFR calc Af Amer: 75 mL/min/{1.73_m2} (ref >59)
GFR calc non Af Amer: 65 mL/min/{1.73_m2} (ref >59)
Glucose: 90 mg/dL (ref 65–99)
Potassium: 4.9 mmol/L (ref 3.5–5.2)
Sodium: 136 mmol/L (ref 134–144)

## 2019-08-22 ENCOUNTER — Telehealth: Payer: Self-pay

## 2019-08-23 ENCOUNTER — Ambulatory Visit: Payer: Medicare HMO | Admitting: Cardiology

## 2019-08-23 ENCOUNTER — Encounter: Payer: Self-pay | Admitting: Cardiology

## 2019-08-23 ENCOUNTER — Other Ambulatory Visit: Payer: Self-pay

## 2019-08-23 VITALS — BP 102/50 | HR 46 | Ht 71.0 in | Wt 281.0 lb

## 2019-08-23 DIAGNOSIS — I25118 Atherosclerotic heart disease of native coronary artery with other forms of angina pectoris: Secondary | ICD-10-CM

## 2019-08-23 DIAGNOSIS — I5020 Unspecified systolic (congestive) heart failure: Secondary | ICD-10-CM

## 2019-08-23 NOTE — Progress Notes (Signed)
Patient referred by Lindie Spruce, MD for CAD  Subjective:   Jonathon Cain, male    DOB: September 04, 1960, 59 y.o.   MRN: [id number]   Chief Complaint  Patient presents with  . Coronary Artery Disease    labs results      HPI  59 y.o. 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   He has not had any further black stools. Energy levels are improving. Leg edema has resolved. Denies chest pain, shortness of breath.   He is enrolled in EMPACT-MI clinical trial. He realized today that he missed study drug for 3 days.    Current Outpatient Medications on File Prior to Visit  Medication Sig Dispense Refill  . acetaminophen (TYLENOL) 325 MG tablet Take 2 tablets (650 mg total) by mouth every 4 (four) hours as needed for headache or mild pain.    [name] Kitchen aspirin 81 MG chewable tablet Chew 1 tablet (81 mg total) by mouth daily. 30 tablet 11  . atorvastatin (LIPITOR) 80 MG tablet Take 1 tablet (80 mg total) by mouth daily. 30 tablet 3  . cetirizine (ZYRTEC) 10 MG tablet Take 10 mg by mouth daily as needed for allergies.    . fluticasone (FLONASE) 50 MCG/ACT nasal spray Place 2 sprays into both nostrils daily as needed for allergies or rhinitis.     . furosemide (LASIX) 20 MG tablet Take 1 tablet (20 mg total) by mouth daily. 30 tablet 1  . latanoprost (XALATAN) 0.005 % ophthalmic solution Place 1 drop into both eyes See admin instructions. Place 1 drop into each eye at bedtime for 2 months on/2 months off- alternate with Timolol    . metoprolol succinate (TOPROL XL) 25 MG 24 hr tablet Take 0.5 tablets (12.5 mg total) by mouth daily. 30 tablet 2  . nitroGLYCERIN (NITROSTAT) 0.4 MG SL tablet Place 1 tablet (0.4 mg total) under the tongue every 5 (five) minutes as needed for chest pain. 25 tablet 2  . pantoprazole (PROTONIX)  40 MG tablet Take 1 tablet (40 mg total) by mouth daily. 30 tablet 2  . sacubitril-valsartan (ENTRESTO) 24-26 MG Take 1 tablet by mouth 2 (two) times daily. 60 tablet 1  . spironolactone (ALDACTONE) 25 MG tablet Take 1 tablet (25 mg total) by mouth daily. 30 tablet 2  . ticagrelor (BRILINTA) 90 MG TABS tablet Take 1 tablet (90 mg total) by mouth 2 (two) times daily. 60 tablet 11  . timolol (TIMOPTIC) 0.5 % ophthalmic solution Place 1 drop into both eyes See admin instructions. Place 1 drop into both eyes two times a day for 2 months on/2 months off- alternate with Latanoprost     No current facility-administered medications on file prior to visit.    Cardiovascular and other pertinent studies:  EKG 08/02/2019: Sinus rhythm 52 bpm. Extensive anterolateral infarct, IVCD.  Echocardiogram 07/27/2019: 1. Left ventricular ejection fraction, by estimation, is 20 to 25%. The left ventricle  has severely decreased function. The left ventricle demonstrates regional wall  motion abnormalities (see scoring diagram/findings for description).  Left ventricular diastolic parameters are consistent with Grade II diastolic dysfunction  (pseudonormalization). Elevated left ventricular end-diastolic pressure.  There is akinesis of the left ventricular, entire anteroseptal wall,  inferoseptal wall, anterior wall and apical segment.  2. Right ventricular systolic function is normal. The right ventricular size is normal.  3.  The mitral valve is normal in structure. Mild mitral valve regurgitation.  4. The aortic valve is normal in structure. Aortic valve regurgitation is not visualized.  No aortic stenosis is present.  5. The inferior vena cava is dilated in size with <50% respiratory variability,  suggesting right atrial pressure of 15 mmHg.   Left Heart Catheterization 07/26/19 (Dr. Einar Gip):  LM: Mild diffuse disease LAD: Very large caliber vessel, occluded at the proximal end.  Gives origin to high D1  which is large.  Successful PTCA and stenting of the proximal LAD overlapping D1, 3.5 x 24 mm Synergy DES deployed at 12 atmospheric pressure for 60 S. 100% to 0% with TIMI 0 to TIMI-3 flow. LCx: Large vessel, giving origin to a very large OM1 which is secondary branches.  Ostium of OM1 has 80% stenosis. RCA: Mild diffuse disease, distal RCA 15 to 20% stenosis, PDA 50% stenosis.  Recommendation: Patient will need aggressive risk factor modification, dual antiplatelet therapy with aspirin and Brilinta for a year, will be watched carefully in the intensive care unit for preshock, suspect his EDP is extremely high at the end of the procedure with excessive contrast use.  Will monitor for any contrast nephropathy as well.  Recent labs: 08/17/2019: Glucose 90, BUN/Cr 17/1.21. EGFR 65. Na/K 136/4.9.  H/H 14/44. MCV 90. Platelets 289  07/26/2019: Glucose 122, BUN/Cr 9/0.9. EGFR 60. H/H 16.1/49.0. MCV 90.2. Platelets 240 HbA1C 5.5% Chol 230, TG 95, HDL 36, LDL 175   Review of Systems  Cardiovascular: Positive for dyspnea on exertion and orthopnea. Negative for chest pain, leg swelling, palpitations and syncope.  Gastrointestinal: Positive for diarrhea and melena.         Vitals:   08/23/19 1348  BP: (!) 102/50  Pulse: (!) 46  SpO2: 97%     Body mass index is 39.19 kg/m. Filed Weights   08/23/19 1348  Weight: 281 lb (127.5 kg)     Objective:   Physical Exam  Constitutional: No distress.  Neck: No JVD present.  Cardiovascular: Normal rate, regular rhythm, normal heart sounds and intact distal pulses.  No murmur heard. Pulmonary/Chest: Effort normal and breath sounds normal. He has no wheezes. He has no rales.  Musculoskeletal:        General: No edema.  Nursing note and vitals reviewed.        Assessment & Recommendations:   59 y.o. 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  CAD: S/p primary PCI to prox LAD. 80% stenosis in large OM1. Continue Aspirin/Brilinta till 07/2020. No melena. Continue atorvastatin 80 mg daily.  Ischemic cardiomyopathy: EF 20-25%. Currently on metoprolol succinate 12.5 mg daily, Entesto 24-26 mg bid, spironolactone 25 mg daily. Lasix as needed. Conitnue wearable defibrillator, Repeat echocardiogram in 10/2019. Enrolled in EMPACT-MI trial EMPACT-MI: A Study to Test Whether Empagliflozin Can Lower the Risk of Heart Failure and Death in People Who Had a Heart Attack (Myocardial Infarction)  Fu in 4 weeks   Aileana Hodder Esther Hardy, MD [facility]. [place] Pager: [phone] Office: [phone]

## 2019-09-01 ENCOUNTER — Telehealth: Payer: Self-pay

## 2019-09-01 ENCOUNTER — Other Ambulatory Visit: Payer: Self-pay

## 2019-09-01 DIAGNOSIS — I5020 Unspecified systolic (congestive) heart failure: Secondary | ICD-10-CM

## 2019-09-01 MED ORDER — SPIRONOLACTONE 25 MG PO TABS: 25.0000 mg | ORAL_TABLET | Freq: Every day | ORAL | 3 refills | Status: DC

## 2019-09-01 NOTE — Telephone Encounter (Signed)
Take 1 tab daily. Please refill 90 pillsX3 refills  Thanks MJP

## 2019-09-01 NOTE — Telephone Encounter (Signed)
Patient called and stated his rx for Spironolactone bottle states to take .5 of 25 mg tablet once daily. However, his chart states to take one 25mg  tablet daily. Patient has been taking 1 tablet daily and is now running out of current rx. Is the correct dosage .5mg  or 1mg ? Please advise. Thanks!

## 2019-09-09 ENCOUNTER — Telehealth (HOSPITAL_COMMUNITY): Payer: Self-pay

## 2019-09-12 ENCOUNTER — Telehealth: Payer: Self-pay

## 2019-09-12 NOTE — Telephone Encounter (Signed)
Received call from from Dentistry office in regards to an initial visit and cleaning. Patient has hx of heart attack and stent placed in May 2021. Per Dr. Einar Gip, patient can proceed with treatment without any additional medications or treatment from Cardiology.

## 2019-09-12 NOTE — Telephone Encounter (Signed)
Cardiac Rehab Medication Review by a Pharmacist  Does the patient  feel that his/her medications are working for him/her?  yes  Has the patient been experiencing any side effects to the medications prescribed?  no  Does the patient measure his/her own blood pressure or blood glucose at home?  no Pt has a wrist blood pressure cuff but doesn't use it because he doesn't trust the readings.  Does the patient have any problems obtaining medications due to transportation or finances?   no  Understanding of regimen: excellent Understanding of indications: good Potential of compliance: excellent    Pharmacist comments: none   Berenice Bouton, PharmD PGY1 Pharmacy Resident  Please check AMION for all [place] phone numbers After 10:00 PM, call [place] [phone]  09/12/2019 3:02 PM

## 2019-09-15 ENCOUNTER — Telehealth (HOSPITAL_COMMUNITY): Payer: Self-pay

## 2019-09-15 NOTE — Telephone Encounter (Signed)
Called pt to make sure he still needed transportation for his cardiac rehab orientation and sessions. Pt replied yes he still needs transportation. Emailed an intake form to the transportation department about pt.

## 2019-09-18 ENCOUNTER — Other Ambulatory Visit: Payer: Self-pay

## 2019-09-18 ENCOUNTER — Encounter (HOSPITAL_COMMUNITY)
Admission: RE | Admit: 2019-09-18 | Discharge: 2019-09-18 | Disposition: A | Discharge status: Home / Self Care | Payer: Medicare HMO | Source: Ambulatory Visit | Attending: Cardiology | Admitting: Cardiology

## 2019-09-18 DIAGNOSIS — Z955 Presence of coronary angioplasty implant and graft: Secondary | ICD-10-CM | POA: Insufficient documentation

## 2019-09-18 DIAGNOSIS — I2109 ST elevation (STEMI) myocardial infarction involving other coronary artery of anterior wall: Secondary | ICD-10-CM | POA: Insufficient documentation

## 2019-09-18 NOTE — Progress Notes (Signed)
Cardiac Rehab Telephone Note:  Successful telephone encounter to Jonathon Cain to confirm Cardiac Rehab orientation appointment for 09/19/19 at 1:30. Nursing assessment completed. Patient questions answered. Instructions for appointment provided. Patient screening for Covid-19 negative.  Lakeisa Heninger E. Rollene Rotunda RN, BSN [place]. [facility]  Cardiac and Pulmonary Rehabilitation Phone: [phone] Fax: [phone]

## 2019-09-19 ENCOUNTER — Encounter (HOSPITAL_COMMUNITY)
Admission: RE | Admit: 2019-09-19 | Discharge: 2019-09-19 | Disposition: A | Discharge status: Home / Self Care | Payer: Medicare HMO | Source: Ambulatory Visit | Attending: Cardiology | Admitting: Cardiology

## 2019-09-19 ENCOUNTER — Encounter (HOSPITAL_COMMUNITY): Payer: Self-pay

## 2019-09-19 VITALS — BP 94/60 | Ht 71.0 in | Wt 277.3 lb

## 2019-09-19 DIAGNOSIS — Z955 Presence of coronary angioplasty implant and graft: Secondary | ICD-10-CM

## 2019-09-19 DIAGNOSIS — I2109 ST elevation (STEMI) myocardial infarction involving other coronary artery of anterior wall: Secondary | ICD-10-CM

## 2019-09-19 NOTE — Progress Notes (Signed)
Cardiac Individual Treatment Plan  Patient Details  Name: Jonathon Cain MRN: [id number] Date of Birth: 07/16/1960 Referring Provider:     [facility] from 09/19/2019 in [place]  Referring Provider [place], [place]      Initial Encounter Date:    CARDIAC REHAB PHASE II ORIENTATION from 09/19/2019 in [place]  Date 09/19/19      Visit Diagnosis: Acute ST elevation myocardial infarction (STEMI) of anterolateral wall (HCC)  Status post coronary artery stent placement  Patient's Home Medications on Admission:  Current Outpatient Medications:    acetaminophen (TYLENOL) 325 MG tablet, Take 2 tablets (650 mg total) by mouth every 4 (four) hours as needed for headache or mild pain., Disp:  , Rfl:    aspirin 81 MG chewable tablet, Chew 1 tablet (81 mg total) by mouth daily., Disp: 30 tablet, Rfl: 11   atorvastatin (LIPITOR) 80 MG tablet, Take 1 tablet (80 mg total) by mouth daily., Disp: 30 tablet, Rfl: 3   cetirizine (ZYRTEC) 10 MG tablet, Take 10 mg by mouth daily as needed for allergies., Disp: , Rfl:    fluticasone (FLONASE) 50 MCG/ACT nasal spray, Place 2 sprays into both nostrils daily as needed for allergies or rhinitis. , Disp: , Rfl:    furosemide (LASIX) 20 MG tablet, Take 1 tablet (20 mg total) by mouth daily., Disp: 30 tablet, Rfl: 1   latanoprost (XALATAN) 0.005 % ophthalmic solution, Place 1 drop into both eyes See admin instructions. Place 1 drop into each eye at bedtime for 2 months on/2 months off- alternate with Timolol, Disp: , Rfl:    metoprolol succinate (TOPROL XL) 25 MG 24 hr tablet, Take 0.5 tablets (12.5 mg total) by mouth daily., Disp: 30 tablet, Rfl: 2   nitroGLYCERIN (NITROSTAT) 0.4 MG SL tablet, Place 1 tablet (0.4 mg total) under the tongue every 5 (five) minutes as needed for chest pain., Disp: 25 tablet, Rfl: 2   pantoprazole (PROTONIX) 40 MG tablet, Take 1  tablet (40 mg total) by mouth daily., Disp: 30 tablet, Rfl: 2   sacubitril-valsartan (ENTRESTO) 24-26 MG, Take 1 tablet by mouth 2 (two) times daily., Disp: 60 tablet, Rfl: 1   spironolactone (ALDACTONE) 25 MG tablet, Take 1 tablet (25 mg total) by mouth daily., Disp: 90 tablet, Rfl: 3   ticagrelor (BRILINTA) 90 MG TABS tablet, Take 1 tablet (90 mg total) by mouth 2 (two) times daily., Disp: 60 tablet, Rfl: 11   timolol (TIMOPTIC) 0.5 % ophthalmic solution, Place 1 drop into both eyes See admin instructions. Place 1 drop into both eyes two times a day for 2 months on/2 months off- alternate with Latanoprost, Disp: , Rfl:   Past Medical History: Past Medical History:  Diagnosis Date   Bladder outlet obstruction    Hyperlipidemia    Hypertension    Retinitis pigmentosa     Tobacco Use: Social History   Tobacco Use  Smoking Status Former Smoker   Packs/day: 1.00   Years: 10.00   Pack years: 10.00   Types: Cigarettes   Quit date: 07/26/2019   Years since quitting: 0.1  Smokeless Tobacco Never Used    Labs: Recent Review Scientist, physiological    Labs for ITP Cardiac and Pulmonary Rehab Latest Ref Rng & Units 07/26/2019 07/26/2019 07/26/2019   Cholestrol 0 - 200 mg/dL 230(H) - -   LDLCALC 0 - 99 mg/dL 175(H) - -   HDL >40 mg/dL 36(L) - -  Trlycerides <150 mg/dL 95 - -   Hemoglobin A1c 4.8 - 5.6 % 5.5 - -   PHART 7.35 - 7.45 - - 7.436   PCO2ART 32 - 48 mmHg - - 38.0   HCO3 20.0 - 28.0 mmol/L - - 25.6   TCO2 22 - 32 mmol/L - 28 27   O2SAT % - - 99.0      Capillary Blood Glucose: No results found for: GLUCAP   Exercise Target Goals: Exercise Program Goal: Individual exercise prescription set using results from initial 6 min walk test and THRR while considering  patients activity barriers and safety.   Exercise Prescription Goal: Initial exercise prescription builds to 30-45 minutes a day of aerobic activity, 2-3 days per week.  Home exercise guidelines will be given to  patient during program as part of exercise prescription that the participant will acknowledge.  Activity Barriers & Risk Stratification:  Activity Barriers & Cardiac Risk Stratification - 09/19/19 1532      Activity Barriers & Cardiac Risk Stratification   Activity Barriers Deconditioning;Decreased Ventricular Function;Balance Concerns;Assistive Device;Other (comment)   Legally blind   Comments Legally blind, has loss of perpherial vision. Uses cane    Cardiac Risk Stratification High           6 Minute Walk:  6 Minute Walk    Row Name 09/19/19 1438         6 Minute Walk   Phase Initial     Distance 900 feet     Walk Time 6 minutes     # of Rest Breaks 1     MPH 1.7     METS 1.79     RPE 9     Perceived Dyspnea  1     VO2 Peak 6.29     Symptoms Yes (comment)     Comments Bilateral leg pain (4 on 0-10 scale). SOB, RPD = 1     Resting HR 56 bpm     Resting BP 94/60     Resting Oxygen Saturation  97 %     Exercise Oxygen Saturation  during 6 min walk 97 %     Max Ex. HR 92 bpm     Max Ex. BP 108/63     2 Minute Post BP 115/73            Oxygen Initial Assessment:   Oxygen Re-Evaluation:   Oxygen Discharge (Final Oxygen Re-Evaluation):   Initial Exercise Prescription:  Initial Exercise Prescription - 09/19/19 1500      Date of Initial Exercise RX and Referring Provider   Date 09/19/19    Referring Provider Vernell Leep    Expected Discharge Date 11/17/19      NuStep   Level 1    SPM 60    Minutes 30    METs 1.7      Prescription Details   Frequency (times per week) 3    Duration Progress to 30 minutes of continuous aerobic without signs/symptoms of physical distress      Intensity   THRR 40-80% of Max Heartrate 57-114    Ratings of Perceived Exertion 11-13    Perceived Dyspnea 0-4      Progression   Progression Continue progressive overload as per policy without signs/symptoms or physical distress.      Resistance Training   Training  Prescription Yes    Weight 3    Reps 10-15           Perform Capillary Blood  Glucose checks as needed.  Exercise Prescription Changes:   Exercise Comments:   Exercise Goals and Review:   Exercise Goals    Row Name 09/19/19 1537             Exercise Goals   Increase Physical Activity Yes       Expected Outcomes Short Term: Attend rehab on a regular basis to increase amount of physical activity.;Long Term: Add in home exercise to make exercise part of routine and to increase amount of physical activity.;Long Term: Exercising regularly at least 3-5 days a week.       Increase Strength and Stamina Yes       Intervention Provide advice, education, support and counseling about physical activity/exercise needs.;Develop an individualized exercise prescription for aerobic and resistive training based on initial evaluation findings, risk stratification, comorbidities and participant's personal goals.       Expected Outcomes Short Term: Increase workloads from initial exercise prescription for resistance, speed, and METs.;Short Term: Perform resistance training exercises routinely during rehab and add in resistance training at home;Long Term: Improve cardiorespiratory fitness, muscular endurance and strength as measured by increased METs and functional capacity (6MWT)       Able to understand and use rate of perceived exertion (RPE) scale Yes       Intervention Provide education and explanation on how to use RPE scale       Expected Outcomes Short Term: Able to use RPE daily in rehab to express subjective intensity level;Long Term:  Able to use RPE to guide intensity level when exercising independently       Knowledge and understanding of Target Heart Rate Range (THRR) Yes       Expected Outcomes Short Term: Able to use daily as guideline for intensity in rehab;Long Term: Able to use THRR to govern intensity when exercising independently;Short Term: Able to state/look up THRR       Able to  check pulse independently Yes       Intervention Provide education and demonstration on how to check pulse in carotid and radial arteries.;Review the importance of being able to check your own pulse for safety during independent exercise       Expected Outcomes Short Term: Able to explain why pulse checking is important during independent exercise;Long Term: Able to check pulse independently and accurately       Understanding of Exercise Prescription Yes       Intervention Provide education, explanation, and written materials on patient's individual exercise prescription       Expected Outcomes Short Term: Able to explain program exercise prescription;Long Term: Able to explain home exercise prescription to exercise independently              Exercise Goals Re-Evaluation :   Discharge Exercise Prescription (Final Exercise Prescription Changes):   Nutrition:  Target Goals: Understanding of nutrition guidelines, daily intake of sodium 1500mg , cholesterol 200mg , calories 30% from fat and 7% or less from saturated fats, daily to have 5 or more servings of fruits and vegetables.  Biometrics:  Pre Biometrics - 09/19/19 1415      Pre Biometrics   Waist Circumference 52.5 inches    Hip Circumference 47.5 inches    Waist to Hip Ratio 1.11 %    Triceps Skinfold 33 mm    % Body Fat 40.2 %    Grip Strength 47 kg    Flexibility --   Not done to h/o low back surgery for Spinabif[facility]   Single Leg  Stand --   Due to pt's status of being legally blind, limited perephial vision. Test not done           Nutrition Therapy Plan and Nutrition Goals:   Nutrition Assessments:   Nutrition Goals Re-Evaluation:   Nutrition Goals Re-Evaluation:   Nutrition Goals Discharge (Final Nutrition Goals Re-Evaluation):   Psychosocial: Target Goals: Acknowledge presence or absence of significant depression and/or stress, maximize coping skills, provide positive support system. Participant is able to  verbalize types and ability to use techniques and skills needed for reducing stress and depression.  Initial Review & Psychosocial Screening:  Initial Psych Review & Screening - 09/19/19 1523      Initial Review   Current issues with Current Depression;History of Depression      Family Dynamics   Good Support System? Yes    Comments Mr. 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 States Covid has not helped his feeling of isolation. He has a very supportive mother who lives in [place]. He has a brother, nephews, aunts and uncles as support. He does not drive and relies on public/private transportation. Now that he has CAD, he feels he is limited in his ability to travel out of state. He is financially sound and hopes that once his cardiac function improves he will be able to take his nephew to [place]. He has no hobbies. He declines referral to [employer] [facility] [employer]. He declines referral for counseling. States he has declined antidepressants from PCP. PHQ9 score 9      Barriers   Psychosocial barriers to participate in program Psychosocial barriers identified (see note)      Screening Interventions   Interventions Encouraged to exercise;To provide support and resources with identified psychosocial needs;Provide feedback about the scores to participant    Expected Outcomes Short Term goal: Utilizing psychosocial counselor, staff and physician to assist with identification of specific Stressors or current issues interfering with healing process. Setting desired goal for each stressor or current issue identified.;Long Term Goal: Stressors or current issues are controlled or eliminated.;Short Term goal: Identification and review with participant of any Quality of Life or Depression concerns found by scoring the questionnaire.;Long Term goal: The participant improves quality of Life and PHQ9  Scores as seen by post scores and/or verbalization of changes           Quality of Life Scores:  Quality of Life - 09/19/19 1558      Quality of Life   Select Quality of Life      Quality of Life Scores   Health/Function Pre 14.23 %    Socioeconomic Pre 16.94 %    Psych/Spiritual Pre 16.5 %    Family Pre 15.1 %    GLOBAL Pre 15.4 %          Scores of 19 and below usually indicate a poorer quality of life in these areas.  A difference of  2-3 points is a clinically meaningful difference.  A difference of 2-3 points in the total score of the Quality of Life Index has been associated with significant improvement in overall quality of life, self-image, physical symptoms, and general health in studies assessing change in quality of life.  PHQ-9: Recent Review Flowsheet Data    Depression screen [facility] 2/9 09/19/2019   Decreased Interest 3   Down, Depressed, Hopeless 0   PHQ - 2 Score 3  Altered sleeping 0   Tired, decreased energy 3   Change in appetite 3   Feeling bad or failure about yourself  0   Trouble concentrating 0   Moving slowly or fidgety/restless 0   Suicidal thoughts 0   PHQ-9 Score 9   Difficult doing work/chores Not difficult at all     Interpretation of Total Score  Total Score Depression Severity:  1-4 = Minimal depression, 5-9 = Mild depression, 10-14 = Moderate depression, 15-19 = Moderately severe depression, 20-27 = Severe depression   Psychosocial Evaluation and Intervention:   Psychosocial Re-Evaluation:   Psychosocial Discharge (Final Psychosocial Re-Evaluation):   Vocational Rehabilitation: Provide vocational rehab assistance to qualifying candidates.   Vocational Rehab Evaluation & Intervention:   Education: Education Goals: Education classes will be provided on a weekly basis, covering required topics. Participant will state understanding/return demonstration of topics presented.  Learning Barriers/Preferences:  Learning  Barriers/Preferences - 09/19/19 1545      Learning Barriers/Preferences   Learning Barriers Sight           Education Topics: Count Your Pulse:  -Group instruction provided by verbal instruction, demonstration, patient participation and written materials to support subject.  Instructors address importance of being able to find your pulse and how to count your pulse when at home without a heart monitor.  Patients get hands on experience counting their pulse with staff help and individually.   Heart Attack, Angina, and Risk Factor Modification:  -Group instruction provided by verbal instruction, video, and written materials to support subject.  Instructors address signs and symptoms of angina and heart attacks.    Also discuss risk factors for heart disease and how to make changes to improve heart health risk factors.   Functional Fitness:  -Group instruction provided by verbal instruction, demonstration, patient participation, and written materials to support subject.  Instructors address safety measures for doing things around the house.  Discuss how to get up and down off the floor, how to pick things up properly, how to safely get out of a chair without assistance, and balance training.   Meditation and Mindfulness:  -Group instruction provided by verbal instruction, patient participation, and written materials to support subject.  Instructor addresses importance of mindfulness and meditation practice to help reduce stress and improve awareness.  Instructor also leads participants through a meditation exercise.    Stretching for Flexibility and Mobility:  -Group instruction provided by verbal instruction, patient participation, and written materials to support subject.  Instructors lead participants through series of stretches that are designed to increase flexibility thus improving mobility.  These stretches are additional exercise for major muscle groups that are typically performed  during regular warm up and cool down.   Hands Only CPR:  -Group verbal, video, and participation provides a basic overview of AHA guidelines for community CPR. Role-play of emergencies allow participants the opportunity to practice calling for help and chest compression technique with discussion of AED use.   Hypertension: -Group verbal and written instruction that provides a basic overview of hypertension including the most recent diagnostic guidelines, risk factor reduction with self-care instructions and medication management.    Nutrition I class: Heart Healthy Eating:  -Group instruction provided by PowerPoint slides, verbal discussion, and written materials to support subject matter. The instructor gives an explanation and review of the Therapeutic Lifestyle Changes diet recommendations, which includes a discussion on lipid goals, dietary fat, sodium, fiber, plant stanol/sterol esters, sugar, and the components of a well-balanced, healthy diet.  Nutrition II class: Lifestyle Skills:  -Group instruction provided by PowerPoint slides, verbal discussion, and written materials to support subject matter. The instructor gives an explanation and review of label reading, grocery shopping for heart health, heart healthy recipe modifications, and ways to make healthier choices when eating out.   Diabetes Question & Answer:  -Group instruction provided by PowerPoint slides, verbal discussion, and written materials to support subject matter. The instructor gives an explanation and review of diabetes co-morbidities, pre- and post-prandial blood glucose goals, pre-exercise blood glucose goals, signs, symptoms, and treatment of hypoglycemia and hyperglycemia, and foot care basics.   Diabetes Blitz:  -Group instruction provided by PowerPoint slides, verbal discussion, and written materials to support subject matter. The instructor gives an explanation and review of the physiology behind type 1 and  type 2 diabetes, diabetes medications and rational behind using different medications, pre- and post-prandial blood glucose recommendations and Hemoglobin A1c goals, diabetes diet, and exercise including blood glucose guidelines for exercising safely.    Portion Distortion:  -Group instruction provided by PowerPoint slides, verbal discussion, written materials, and food models to support subject matter. The instructor gives an explanation of serving size versus portion size, changes in portions sizes over the last 20 years, and what consists of a serving from each food group.   Stress Management:  -Group instruction provided by verbal instruction, video, and written materials to support subject matter.  Instructors review role of stress in heart disease and how to cope with stress positively.     Exercising on Your Own:  -Group instruction provided by verbal instruction, power point, and written materials to support subject.  Instructors discuss benefits of exercise, components of exercise, frequency and intensity of exercise, and end points for exercise.  Also discuss use of nitroglycerin and activating EMS.  Review options of places to exercise outside of rehab.  Review guidelines for sex with heart disease.   Cardiac Drugs I:  -Group instruction provided by verbal instruction and written materials to support subject.  Instructor reviews cardiac drug classes: antiplatelets, anticoagulants, beta blockers, and statins.  Instructor discusses reasons, side effects, and lifestyle considerations for each drug class.   Cardiac Drugs II:  -Group instruction provided by verbal instruction and written materials to support subject.  Instructor reviews cardiac drug classes: angiotensin converting enzyme inhibitors (ACE-I), angiotensin II receptor blockers (ARBs), nitrates, and calcium channel blockers.  Instructor discusses reasons, side effects, and lifestyle considerations for each drug  class.   Anatomy and Physiology of the Circulatory System:  Group verbal and written instruction and models provide basic cardiac anatomy and physiology, with the coronary electrical and arterial systems. Review of: AMI, Angina, Valve disease, Heart Failure, Peripheral Artery Disease, Cardiac Arrhythmia, Pacemakers, and the ICD.   Other Education:  -Group or individual verbal, written, or video instructions that support the educational goals of the cardiac rehab program.   Holiday Eating Survival Tips:  -Group instruction provided by PowerPoint slides, verbal discussion, and written materials to support subject matter. The instructor gives patients tips, tricks, and techniques to help them not only survive but enjoy the holidays despite the onslaught of food that accompanies the holidays.   Knowledge Questionnaire Score:  Knowledge Questionnaire Score - 09/19/19 1558      Knowledge Questionnaire Score   Pre Score 22/24           Core Components/Risk Factors/Patient Goals at Admission:  Personal Goals and Risk Factors at Admission - 09/19/19 1545      Core  Components/Risk Factors/Patient Goals on Admission    Weight Management Yes;Obesity;Weight Loss    Intervention Weight Management: Develop a combined nutrition and exercise program designed to reach desired caloric intake, while maintaining appropriate intake of nutrient and fiber, sodium and fats, and appropriate energy expenditure required for the weight goal.;Weight Management: Provide education and appropriate resources to help participant work on and attain dietary goals.;Weight Management/Obesity: Establish reasonable short term and long term weight goals.;Obesity: Provide education and appropriate resources to help participant work on and attain dietary goals.    Admit Weight 277 lb 5.4 oz (125.8 kg)    Expected Outcomes Short Term: Continue to assess and modify interventions until short term weight is achieved;Long Term:  Adherence to nutrition and physical activity/exercise program aimed toward attainment of established weight goal;Weight Loss: Understanding of general recommendations for a balanced deficit meal plan, which promotes 1-2 lb weight loss per week and includes a negative energy balance of [phone] kcal/d;Understanding recommendations for meals to include 15-35% energy as protein, 25-35% energy from fat, 35-60% energy from carbohydrates, less than 200mg  of dietary cholesterol, 20-35 gm of total fiber daily;Understanding of distribution of calorie intake throughout the day with the consumption of 4-5 meals/snacks    Lipids Yes    Intervention Provide education and support for participant on nutrition & aerobic/resistive exercise along with prescribed medications to achieve LDL 70mg , HDL >40mg .    Expected Outcomes Short Term: Participant states understanding of desired cholesterol values and is compliant with medications prescribed. Participant is following exercise prescription and nutrition guidelines.;Long Term: Cholesterol controlled with medications as prescribed, with individualized exercise RX and with personalized nutrition plan. Value goals: LDL < 70mg , HDL > 40 mg.           Core Components/Risk Factors/Patient Goals Review:    Core Components/Risk Factors/Patient Goals at Discharge (Final Review):    ITP Comments:  ITP Comments    Row Name 09/19/19 1341           ITP Comments Dr. Fransico Him Medical Director Card[facility]              Comments: Patient attended orientation on 09/19/2019 to review rules and guidelines for program.  Completed 6 minute walk test, Intitial ITP, and exercise prescription.  VSS. Telemetry-SB, NSR, Occ PVCs.  Asymptomatic. Safety measures and social distancing in place per CDC guidelines.

## 2019-09-20 ENCOUNTER — Other Ambulatory Visit: Payer: Self-pay

## 2019-09-20 ENCOUNTER — Ambulatory Visit: Payer: Medicare HMO | Admitting: Cardiology

## 2019-09-20 ENCOUNTER — Encounter: Payer: Self-pay | Admitting: Cardiology

## 2019-09-20 VITALS — BP 93/50 | HR 53 | Resp 17 | Ht 71.0 in | Wt 275.0 lb

## 2019-09-20 DIAGNOSIS — I5020 Unspecified systolic (congestive) heart failure: Secondary | ICD-10-CM

## 2019-09-20 DIAGNOSIS — I2510 Atherosclerotic heart disease of native coronary artery without angina pectoris: Secondary | ICD-10-CM

## 2019-09-20 NOTE — Progress Notes (Signed)
Patient referred by Lindie Spruce, MD for CAD  Subjective:   Jonathon Cain, male    DOB: 10-06-1960, 59 y.o.   MRN: [id number]   Chief Complaint  Patient presents with  . HFrEF  . Follow-up    4 week     HPI  59 y.o. 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   He has not had any further black stools. Energy levels are improving. Leg edema has resolved. Denies chest pain, shortness of breath.  Blood pressure is low normal.  He reports only occasional lightheadedness when he does not drink enough water.  He is enrolled in EMPACT-MI clinical trial.    Current Outpatient Medications on File Prior to Visit  Medication Sig Dispense Refill  . acetaminophen (TYLENOL) 325 MG tablet Take 2 tablets (650 mg total) by mouth every 4 (four) hours as needed for headache or mild pain.    [name] Kitchen aspirin 81 MG chewable tablet Chew 1 tablet (81 mg total) by mouth daily. 30 tablet 11  . atorvastatin (LIPITOR) 80 MG tablet Take 1 tablet (80 mg total) by mouth daily. 30 tablet 3  . cetirizine (ZYRTEC) 10 MG tablet Take 10 mg by mouth daily as needed for allergies.    . fluticasone (FLONASE) 50 MCG/ACT nasal spray Place 2 sprays into both nostrils daily as needed for allergies or rhinitis.     . furosemide (LASIX) 20 MG tablet Take 1 tablet (20 mg total) by mouth daily. 30 tablet 1  . metoprolol succinate (TOPROL XL) 25 MG 24 hr tablet Take 0.5 tablets (12.5 mg total) by mouth daily. 30 tablet 2  . nitroGLYCERIN (NITROSTAT) 0.4 MG SL tablet Place 1 tablet (0.4 mg total) under the tongue every 5 (five) minutes as needed for chest pain. 25 tablet 2  . pantoprazole (PROTONIX) 40 MG tablet Take 1 tablet (40 mg total) by mouth daily. 30 tablet 2  . sacubitril-valsartan (ENTRESTO) 24-26 MG Take 1 tablet by mouth 2 (two) times  daily. 60 tablet 1  . spironolactone (ALDACTONE) 25 MG tablet Take 1 tablet (25 mg total) by mouth daily. 90 tablet 3  . ticagrelor (BRILINTA) 90 MG TABS tablet Take 1 tablet (90 mg total) by mouth 2 (two) times daily. 60 tablet 11  . latanoprost (XALATAN) 0.005 % ophthalmic solution Place 1 drop into both eyes See admin instructions. Place 1 drop into each eye at bedtime for 2 months on/2 months off- alternate with Timolol (Patient not taking: Reported on 09/20/2019)    . timolol (TIMOPTIC) 0.5 % ophthalmic solution Place 1 drop into both eyes See admin instructions. Place 1 drop into both eyes two times a day for 2 months on/2 months off- alternate with Latanoprost (Patient not taking: Reported on 09/20/2019)     No current facility-administered medications on file prior to visit.    Cardiovascular and other pertinent studies:  EKG 08/02/2019: Sinus rhythm 52 bpm. Extensive anterolateral infarct, IVCD.  Echocardiogram 07/27/2019: 1. Left ventricular ejection fraction, by estimation, is 20 to 25%. The left ventricle  has severely decreased function. The left ventricle demonstrates regional wall  motion abnormalities (see scoring diagram/findings for description).  Left ventricular diastolic parameters are consistent with Grade II diastolic dysfunction  (pseudonormalization). Elevated left ventricular end-diastolic pressure.  There is akinesis of the left ventricular, entire anteroseptal wall,  inferoseptal wall, anterior wall  and apical segment.  2. Right ventricular systolic function is normal. The right ventricular size is normal.  3. The mitral valve is normal in structure. Mild mitral valve regurgitation.  4. The aortic valve is normal in structure. Aortic valve regurgitation is not visualized.  No aortic stenosis is present.  5. The inferior vena cava is dilated in size with <50% respiratory variability,  suggesting right atrial pressure of 15 mmHg.   Left Heart Catheterization  07/26/19 (Dr. Einar Gip):  LM: Mild diffuse disease LAD: Very large caliber vessel, occluded at the proximal end.  Gives origin to high D1 which is large.  Successful PTCA and stenting of the proximal LAD overlapping D1, 3.5 x 24 mm Synergy DES deployed at 12 atmospheric pressure for 60 S. 100% to 0% with TIMI 0 to TIMI-3 flow. LCx: Large vessel, giving origin to a very large OM1 which is secondary branches.  Ostium of OM1 has 80% stenosis. RCA: Mild diffuse disease, distal RCA 15 to 20% stenosis, PDA 50% stenosis.  Recommendation: Patient will need aggressive risk factor modification, dual antiplatelet therapy with aspirin and Brilinta for a year, will be watched carefully in the intensive care unit for preshock, suspect his EDP is extremely high at the end of the procedure with excessive contrast use.  Will monitor for any contrast nephropathy as well.  Recent labs: 08/17/2019: Glucose 90, BUN/Cr 17/1.21. EGFR 65. Na/K 136/4.9.  H/H 14/44. MCV 90. Platelets 289  07/26/2019: Glucose 122, BUN/Cr 9/0.9. EGFR 60. H/H 16.1/49.0. MCV 90.2. Platelets 240 HbA1C 5.5% Chol 230, TG 95, HDL 36, LDL 175   Review of Systems  Cardiovascular: Positive for dyspnea on exertion and orthopnea. Negative for chest pain, leg swelling, palpitations and syncope.  Gastrointestinal: Positive for diarrhea and melena.         Vitals:   09/20/19 1332  BP: (!) 93/50  Pulse: (!) 53  Resp: 17  SpO2: 97%     Body mass index is 38.35 kg/m. Filed Weights   09/20/19 1332  Weight: 275 lb (124.7 kg)     Objective:   Physical Exam Vitals and nursing note reviewed.  Constitutional:      General: He is not in acute distress. Neck:     Vascular: No JVD.  Cardiovascular:     Rate and Rhythm: Normal rate and regular rhythm.     Pulses: Intact distal pulses.     Heart sounds: Normal heart sounds. No murmur heard.   Pulmonary:     Effort: Pulmonary effort is normal.     Breath sounds: Normal breath sounds.  No wheezing or rales.          Assessment & Recommendations:   59 y.o. 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  CAD: S/p primary PCI to prox LAD. 80% stenosis in large OM1 with TIMI III flow.  No angina symptoms at this time. We will continue medical management for now.  If his EF does not improve on follow-up echocardiogram, could then consider revascularization to the OM/LCx Continue Aspirin/Brilinta till 07/2020. No melena. Continue atorvastatin 80 mg daily.  Ischemic cardiomyopathy: EF 20-25%. Currently on metoprolol succinate 12.5 mg daily, Entesto 24-26 mg bid, spironolactone 25 mg daily. Lasix as needed.  Recommend liberal hydration. Conitnue wearable defibrillator, Repeat echocardiogram in 10/2019. Enrolled in EMPACT-MI trial EMPACT-MI: A Study to Test Whether Empagliflozin Can Lower the  Risk of Heart Failure and Death in People Who Had a Heart Attack (Myocardial Infarction)  Follow-up in August after repeat echocardiogram. Okay to start cardiac rehab at this time.  Nigel Mormon, MD [facility] Pager: [phone] Office: [phone]

## 2019-09-27 ENCOUNTER — Encounter (HOSPITAL_COMMUNITY)
Admission: RE | Admit: 2019-09-27 | Discharge: 2019-09-27 | Disposition: A | Discharge status: Home / Self Care | Payer: Medicare HMO | Source: Ambulatory Visit | Attending: Cardiology | Admitting: Cardiology

## 2019-09-27 ENCOUNTER — Other Ambulatory Visit: Payer: Self-pay

## 2019-09-27 DIAGNOSIS — I2109 ST elevation (STEMI) myocardial infarction involving other coronary artery of anterior wall: Secondary | ICD-10-CM | POA: Insufficient documentation

## 2019-09-27 DIAGNOSIS — Z955 Presence of coronary angioplasty implant and graft: Secondary | ICD-10-CM

## 2019-09-27 NOTE — Progress Notes (Signed)
Daily Session Note  Patient Details  Name: Jonathon Cain MRN: [id number] Date of Birth: 12/24/1960 Referring Provider:     [place] from 09/19/2019 in [place]  Referring Provider Vernell Leep      Encounter Date: 09/27/2019  Check In:  Session Check In - 09/27/19 1338      Check-In   Supervising physician immediately available to respond to emergencies [facility] immediately available    Physician(s) Dr. Avon Gully    Location [facility]    Staff Present Dorma Russell, MS,ACSM CEP, Exercise Physiologist;David Lilyan Punt, MS, EP-C, CCRP;Henryk Ursin Rollene Rotunda, RN, Roque Cash, RN    Virtual Visit No    Medication changes reported     No    Fall or balance concerns reported    No    Tobacco Cessation No Change    Warm-up and Cool-down Performed on first and last piece of equipment    Resistance Training Performed No    VAD Patient? No    PAD/SET Patient? No      Pain Assessment   Currently in Pain? No/denies    Multiple Pain Sites No           Capillary Blood Glucose: No results found for this or any previous visit (from the past 24 hour(s)).    Social History   Tobacco Use  Smoking Status Former Smoker  . Packs/day: 1.00  . Years: 10.00  . Pack years: 10.00  . Types: Cigarettes  . Quit date: 07/26/2019  . Years since quitting: 0.1  Smokeless Tobacco Never Used    Goals Met:  Exercise tolerated well No report of cardiac concerns or symptoms  Goals Unmet:  Not Applicable  Comments: Pt started cardiac rehab today.  Pt tolerated light exercise without difficulty. VSS, telemetry-NSR, asymptomatic.  Medication list reconciled. Pt denies barriers to medicaiton compliance.  PSYCHOSOCIAL ASSESSMENT:  PHQ-9 (see QOL review below). Pt oriented to exercise equipment and routine. Understanding verbalized.  QUALITY OF LIFE SCORE REVIEW  Pt completed Quality of Life survey as a participant  in Cardiac Rehab.  Scores 21.0 or below are considered low.  Pt score very low in several areas Overall 15.40, Health and Function 14.23, socioeconomic 16.94, physiological and spiritual 16.50, family 15.10. Patient quality of life significantly altered by physical constraints including legal blindness secondary to retinitis pigmentosa. Patient is no longer able to drive and has no children or significant other to assist with transportation. He relies on public transportation which limits his freedom and ability to perform as prior to diagnosis.[name] Kitchen His recent cardiac illness has significantly impacted his quality of life secondary to low EF, lifevest, and shortness of breath which limits his travel as well.  Offered emotional support and reassurance. Offered counseling which he declined. Patient encouraged to discuss his depression symptoms with his PCP. Will continue to monitor and intervene as necessary.     Dr. Fransico Him is Medical Director for Cardiac Rehab at [facility].

## 2019-09-29 ENCOUNTER — Encounter (HOSPITAL_COMMUNITY): Payer: Medicare HMO

## 2019-09-29 ENCOUNTER — Other Ambulatory Visit: Payer: Self-pay

## 2019-09-29 ENCOUNTER — Encounter (HOSPITAL_COMMUNITY)
Admission: RE | Admit: 2019-09-29 | Discharge: 2019-09-29 | Disposition: A | Discharge status: Home / Self Care | Payer: Medicare HMO | Source: Ambulatory Visit | Attending: Cardiology | Admitting: Cardiology

## 2019-09-29 DIAGNOSIS — Z955 Presence of coronary angioplasty implant and graft: Secondary | ICD-10-CM

## 2019-09-29 DIAGNOSIS — I2109 ST elevation (STEMI) myocardial infarction involving other coronary artery of anterior wall: Secondary | ICD-10-CM | POA: Diagnosis not present

## 2019-10-02 ENCOUNTER — Encounter (HOSPITAL_COMMUNITY)
Admission: RE | Admit: 2019-10-02 | Discharge: 2019-10-02 | Disposition: A | Discharge status: Home / Self Care | Payer: Medicare HMO | Source: Ambulatory Visit | Attending: Cardiology | Admitting: Cardiology

## 2019-10-02 ENCOUNTER — Other Ambulatory Visit: Payer: Self-pay

## 2019-10-02 VITALS — Wt 275.0 lb

## 2019-10-02 DIAGNOSIS — I2109 ST elevation (STEMI) myocardial infarction involving other coronary artery of anterior wall: Secondary | ICD-10-CM | POA: Diagnosis not present

## 2019-10-02 DIAGNOSIS — Z955 Presence of coronary angioplasty implant and graft: Secondary | ICD-10-CM

## 2019-10-02 NOTE — Progress Notes (Addendum)
Jonathon Cain 59 y.o. male Nutrition Note  Visit Diagnosis: Acute ST elevation myocardial infarction (STEMI) of anterolateral wall (HCC)  Status post coronary artery stent placement   Past Medical History:  Diagnosis Date  . Bladder outlet obstruction   . Hyperlipidemia   . Hypertension   . Retinitis pigmentosa      Medications reviewed.   Current Outpatient Medications:  .  acetaminophen (TYLENOL) 325 MG tablet, Take 2 tablets (650 mg total) by mouth every 4 (four) hours as needed for headache or mild pain., Disp:  , Rfl:  .  aspirin 81 MG chewable tablet, Chew 1 tablet (81 mg total) by mouth daily., Disp: 30 tablet, Rfl: 11 .  atorvastatin (LIPITOR) 80 MG tablet, Take 1 tablet (80 mg total) by mouth daily., Disp: 30 tablet, Rfl: 3 .  cetirizine (ZYRTEC) 10 MG tablet, Take 10 mg by mouth daily as needed for allergies., Disp: , Rfl:  .  fluticasone (FLONASE) 50 MCG/ACT nasal spray, Place 2 sprays into both nostrils daily as needed for allergies or rhinitis. , Disp: , Rfl:  .  furosemide (LASIX) 20 MG tablet, Take 1 tablet (20 mg total) by mouth daily., Disp: 30 tablet, Rfl: 1 .  latanoprost (XALATAN) 0.005 % ophthalmic solution, Place 1 drop into both eyes See admin instructions. Place 1 drop into each eye at bedtime for 2 months on/2 months off- alternate with Timolol (Patient not taking: Reported on 09/20/2019), Disp: , Rfl:  .  metoprolol succinate (TOPROL XL) 25 MG 24 hr tablet, Take 0.5 tablets (12.5 mg total) by mouth daily., Disp: 30 tablet, Rfl: 2 .  nitroGLYCERIN (NITROSTAT) 0.4 MG SL tablet, Place 1 tablet (0.4 mg total) under the tongue every 5 (five) minutes as needed for chest pain., Disp: 25 tablet, Rfl: 2 .  pantoprazole (PROTONIX) 40 MG tablet, Take 1 tablet (40 mg total) by mouth daily., Disp: 30 tablet, Rfl: 2 .  sacubitril-valsartan (ENTRESTO) 24-26 MG, Take 1 tablet by mouth 2 (two) times daily., Disp: 60 tablet, Rfl: 1 .  spironolactone (ALDACTONE) 25 MG  tablet, Take 1 tablet (25 mg total) by mouth daily., Disp: 90 tablet, Rfl: 3 .  ticagrelor (BRILINTA) 90 MG TABS tablet, Take 1 tablet (90 mg total) by mouth 2 (two) times daily., Disp: 60 tablet, Rfl: 11 .  timolol (TIMOPTIC) 0.5 % ophthalmic solution, Place 1 drop into both eyes See admin instructions. Place 1 drop into both eyes two times a day for 2 months on/2 months off- alternate with Latanoprost (Patient not taking: Reported on 09/20/2019), Disp: , Rfl:    Ht Readings from Last 1 Encounters:  09/20/19 5\' 11"  (1.803 m)     Wt Readings from Last 3 Encounters:  09/20/19 275 lb (124.7 kg)  09/19/19 277 lb 5.4 oz (125.8 kg)  08/23/19 281 lb (127.5 kg)     There is no height or weight on file to calculate BMI.   Social History   Tobacco Use  Smoking Status Former Smoker  . Packs/day: 1.00  . Years: 10.00  . Pack years: 10.00  . Types: Cigarettes  . Quit date: 07/26/2019  . Years since quitting: 0.1  Smokeless Tobacco Never Used     Lab Results  Component Value Date   CHOL 230 (H) 07/26/2019   Lab Results  Component Value Date   HDL 36 (L) 07/26/2019   Lab Results  Component Value Date   LDLCALC 175 (H) 07/26/2019   Lab Results  Component Value Date  TRIG 95 07/26/2019     Lab Results  Component Value Date   HGBA1C 5.5 07/26/2019     CBG (last 3)  No results for input(s): GLUCAP in the last 72 hours.   Nutrition Note  Spoke with pt. Nutrition Plan and Nutrition Survey goals reviewed with pt. Pt is following a Heart Healthy diet. He reads labels to reduce sodium, saturated fats, trans fats, and sugars. He avoids SSB. He does not cook much for himself which will be a barrier to healthy eating when his mom is not cooking for him. Right now, he is back to living alone but his mom cooks and provides meals. When eating alone, he chooses frozen meals with higher sodium content. Will provided alternative options. Reviewed lipid panel - LDL 175 mg/dl. Will address  saturated fat and fiber recommendations. Pt states he has lost 55 lbs by avoiding fast food, decreasing portions, and eating less sweets. He wants to continue working on this.  Pt with dx of CHF. Per discussion, pt does use canned/convenience foods often. Pt does not add salt to food. Pt does not eat out frequently.  Difficulty exercising at home due to arthritic joint/tissue pain. Pt expressed understanding of the information reviewed.   Nutrition Diagnosis ? Obese  II = 35-39.9 related to excessive energy intake as evidenced by a 38.35 kg/m2 ? Excessive saturated fat intake related to high intake of convenience foods as evidenced by LDL 175 mg/dl  Nutrition Intervention ? Pt's individual nutrition plan reviewed with pt. Benefits of adopting Heart Healthy diet discussed when Medficts reviewed. ? Continue client-centered nutrition education by RD, as part of interdisciplinary care.  Goal(s)  ? Pt to build a healthy plate including vegetables, fruits, whole grains, and low-fat dairy products in a heart healthy meal plan. ? Pt to identify food quantities necessary to achieve weight loss of 6-24 lb at graduation from cardiac rehab. Pt to weigh and measure serving sizes  Plan:   Will provide client-centered nutrition education as part of interdisciplinary care  Monitor and evaluate progress toward nutrition goal with team.   Michaele Offer, MS, RDN, LDN

## 2019-10-04 ENCOUNTER — Encounter (HOSPITAL_COMMUNITY)
Admission: RE | Admit: 2019-10-04 | Discharge: 2019-10-04 | Disposition: A | Discharge status: Home / Self Care | Payer: Medicare HMO | Source: Ambulatory Visit | Attending: Cardiology | Admitting: Cardiology

## 2019-10-04 ENCOUNTER — Other Ambulatory Visit: Payer: Self-pay

## 2019-10-04 DIAGNOSIS — Z955 Presence of coronary angioplasty implant and graft: Secondary | ICD-10-CM

## 2019-10-04 DIAGNOSIS — I2109 ST elevation (STEMI) myocardial infarction involving other coronary artery of anterior wall: Secondary | ICD-10-CM

## 2019-10-06 ENCOUNTER — Other Ambulatory Visit: Payer: Self-pay

## 2019-10-06 ENCOUNTER — Encounter (HOSPITAL_COMMUNITY)
Admission: RE | Admit: 2019-10-06 | Discharge: 2019-10-06 | Disposition: A | Discharge status: Home / Self Care | Payer: Medicare HMO | Source: Ambulatory Visit | Attending: Cardiology | Admitting: Cardiology

## 2019-10-06 DIAGNOSIS — Z955 Presence of coronary angioplasty implant and graft: Secondary | ICD-10-CM

## 2019-10-06 DIAGNOSIS — I2109 ST elevation (STEMI) myocardial infarction involving other coronary artery of anterior wall: Secondary | ICD-10-CM | POA: Diagnosis not present

## 2019-10-09 ENCOUNTER — Encounter (HOSPITAL_COMMUNITY)
Admission: RE | Admit: 2019-10-09 | Discharge: 2019-10-09 | Disposition: A | Discharge status: Home / Self Care | Payer: Medicare HMO | Source: Ambulatory Visit | Attending: Cardiology | Admitting: Cardiology

## 2019-10-09 ENCOUNTER — Other Ambulatory Visit: Payer: Self-pay

## 2019-10-09 DIAGNOSIS — I2109 ST elevation (STEMI) myocardial infarction involving other coronary artery of anterior wall: Secondary | ICD-10-CM | POA: Diagnosis not present

## 2019-10-09 DIAGNOSIS — Z955 Presence of coronary angioplasty implant and graft: Secondary | ICD-10-CM

## 2019-10-10 NOTE — Progress Notes (Signed)
Cardiac Individual Treatment Plan  Patient Details  Name: Jonathon Cain MRN: [id number] Date of Birth: 1961-01-07 Referring Provider:     [place] from 09/19/2019 in [place]  Referring Provider [place], [place]      Initial Encounter Date:    CARDIAC REHAB PHASE II ORIENTATION from 09/19/2019 in [place]  Date 09/19/19      Visit Diagnosis: Status post coronary artery stent placement  Acute ST elevation myocardial infarction (STEMI) of anterolateral wall (HCC)  Patient's Home Medications on Admission:  Current Outpatient Medications:  .  acetaminophen (TYLENOL) 325 MG tablet, Take 2 tablets (650 mg total) by mouth every 4 (four) hours as needed for headache or mild pain., Disp:  , Rfl:  .  aspirin 81 MG chewable tablet, Chew 1 tablet (81 mg total) by mouth daily., Disp: 30 tablet, Rfl: 11 .  atorvastatin (LIPITOR) 80 MG tablet, Take 1 tablet (80 mg total) by mouth daily., Disp: 30 tablet, Rfl: 3 .  cetirizine (ZYRTEC) 10 MG tablet, Take 10 mg by mouth daily as needed for allergies., Disp: , Rfl:  .  fluticasone (FLONASE) 50 MCG/ACT nasal spray, Place 2 sprays into both nostrils daily as needed for allergies or rhinitis. , Disp: , Rfl:  .  furosemide (LASIX) 20 MG tablet, Take 1 tablet (20 mg total) by mouth daily., Disp: 30 tablet, Rfl: 1 .  latanoprost (XALATAN) 0.005 % ophthalmic solution, Place 1 drop into both eyes See admin instructions. Place 1 drop into each eye at bedtime for 2 months on/2 months off- alternate with Timolol (Patient not taking: Reported on 09/20/2019), Disp: , Rfl:  .  metoprolol succinate (TOPROL XL) 25 MG 24 hr tablet, Take 0.5 tablets (12.5 mg total) by mouth daily., Disp: 30 tablet, Rfl: 2 .  nitroGLYCERIN (NITROSTAT) 0.4 MG SL tablet, Place 1 tablet (0.4 mg total) under the tongue every 5 (five) minutes as needed for chest pain., Disp: 25 tablet, Rfl: 2 .   pantoprazole (PROTONIX) 40 MG tablet, Take 1 tablet (40 mg total) by mouth daily., Disp: 30 tablet, Rfl: 2 .  sacubitril-valsartan (ENTRESTO) 24-26 MG, Take 1 tablet by mouth 2 (two) times daily., Disp: 60 tablet, Rfl: 1 .  spironolactone (ALDACTONE) 25 MG tablet, Take 1 tablet (25 mg total) by mouth daily., Disp: 90 tablet, Rfl: 3 .  ticagrelor (BRILINTA) 90 MG TABS tablet, Take 1 tablet (90 mg total) by mouth 2 (two) times daily., Disp: 60 tablet, Rfl: 11 .  timolol (TIMOPTIC) 0.5 % ophthalmic solution, Place 1 drop into both eyes See admin instructions. Place 1 drop into both eyes two times a day for 2 months on/2 months off- alternate with Latanoprost (Patient not taking: Reported on 09/20/2019), Disp: , Rfl:   Past Medical History: Past Medical History:  Diagnosis Date  . Bladder outlet obstruction   . Hyperlipidemia   . Hypertension   . Retinitis pigmentosa     Tobacco Use: Social History   Tobacco Use  Smoking Status Former Smoker  . Packs/day: 1.00  . Years: 10.00  . Pack years: 10.00  . Types: Cigarettes  . Quit date: 07/26/2019  . Years since quitting: 0.2  Smokeless Tobacco Never Used    Labs: Recent Review Scientist, physiological    Labs for ITP Cardiac and Pulmonary Rehab Latest Ref Rng & Units 07/26/2019 07/26/2019 07/26/2019   Cholestrol 0 - 200 mg/dL 230(H) - -   LDLCALC 0 -  99 mg/dL 175(H) - -   HDL >40 mg/dL 36(L) - -   Trlycerides <150 mg/dL 95 - -   Hemoglobin A1c 4.8 - 5.6 % 5.5 - -   PHART 7.35 - 7.45 - - 7.436   PCO2ART 32 - 48 mmHg - - 38.0   HCO3 20.0 - 28.0 mmol/L - - 25.6   TCO2 22 - 32 mmol/L - 28 27   O2SAT % - - 99.0      Capillary Blood Glucose: No results found for: GLUCAP   Exercise Target Goals: Exercise Program Goal: Individual exercise prescription set using results from initial 6 min walk test and THRR while considering  patient's activity barriers and safety.   Exercise Prescription Goal: Initial exercise prescription builds to 30-45 minutes a  day of aerobic activity, 2-3 days per week.  Home exercise guidelines will be given to patient during program as part of exercise prescription that the participant will acknowledge.  Activity Barriers & Risk Stratification:  Activity Barriers & Cardiac Risk Stratification - 09/19/19 1532      Activity Barriers & Cardiac Risk Stratification   Activity Barriers Deconditioning;Decreased Ventricular Function;Balance Concerns;Assistive Device;Other (comment)   Legally blind   Comments Legally blind, has loss of perpherial vision. Uses cane    Cardiac Risk Stratification High           6 Minute Walk:  6 Minute Walk    Row Name 09/19/19 1438         6 Minute Walk   Phase Initial     Distance 900 feet     Walk Time 6 minutes     # of Rest Breaks 1     MPH 1.7     METS 1.79     RPE 9     Perceived Dyspnea  1     VO2 Peak 6.29     Symptoms Yes (comment)     Comments Bilateral leg pain (4 on 0-10 scale). SOB, RPD = 1     Resting HR 56 bpm     Resting BP 94/60     Resting Oxygen Saturation  97 %     Exercise Oxygen Saturation  during 6 min walk 97 %     Max Ex. HR 92 bpm     Max Ex. BP 108/63     2 Minute Post BP 115/73            Oxygen Initial Assessment:   Oxygen Re-Evaluation:   Oxygen Discharge (Final Oxygen Re-Evaluation):   Initial Exercise Prescription:  Initial Exercise Prescription - 09/19/19 1500      Date of Initial Exercise RX and Referring Provider   Date 09/19/19    Referring Provider Vernell Leep    Expected Discharge Date 11/17/19      NuStep   Level 1    SPM 60    Minutes 30    METs 1.7      Prescription Details   Frequency (times per week) 3    Duration Progress to 30 minutes of continuous aerobic without signs/symptoms of physical distress      Intensity   THRR 40-80% of Max Heartrate 57-114    Ratings of Perceived Exertion 11-13    Perceived Dyspnea 0-4      Progression   Progression Continue progressive overload as per  policy without signs/symptoms or physical distress.      Resistance Training   Training Prescription Yes    Weight 3  Reps 10-15           Perform Capillary Blood Glucose checks as needed.  Exercise Prescription Changes:   Exercise Prescription Changes    Row Name 09/27/19 1450 10/11/19 1600           Response to Exercise   Blood Pressure (Admit) 90/50 102/62      Blood Pressure (Exercise) 128/73 106/68      Blood Pressure (Exit) 107/70 102/60      Heart Rate (Admit) 72 bpm 66 bpm      Heart Rate (Exercise) 83 bpm 91 bpm      Heart Rate (Exit) 57 bpm 57 bpm      Rating of Perceived Exertion (Exercise) 9 11      Symptoms None None      Comments Pt's first day of exercise. Tolerated well Reviwed METs      Duration Progress to 30 minutes of  aerobic without signs/symptoms of physical distress Progress to 30 minutes of  aerobic without signs/symptoms of physical distress      Intensity THRR unchanged THRR unchanged        Progression   Progression Continue to progress workloads to maintain intensity without signs/symptoms of physical distress. Continue to progress workloads to maintain intensity without signs/symptoms of physical distress.      Average METs 1.7 1.7        Resistance Training   Training Prescription No No      Weight --  No wegits done on Wednesday. Pt will begin 3 lb wts 09/29/19 --  Weights not done on Wednesday. Uses 3 lb wts M-F        Interval Training   Interval Training No No        NuStep   Level 1 1      SPM 75 75      Minutes 30 30      METs 1.7 1.7             Exercise Comments:   Exercise Comments    Row Name 09/27/19 1503 10/11/19 1618         Exercise Comments Pt's first day of exercise. Pt tolerated session well with no complaints. Reviewed METs today.             Exercise Goals and Review:   Exercise Goals    Row Name 09/19/19 1537             Exercise Goals   Increase Physical Activity Yes       Expected  Outcomes Short Term: Attend rehab on a regular basis to increase amount of physical activity.;Long Term: Add in home exercise to make exercise part of routine and to increase amount of physical activity.;Long Term: Exercising regularly at least 3-5 days a week.       Increase Strength and Stamina Yes       Intervention Provide advice, education, support and counseling about physical activity/exercise needs.;Develop an individualized exercise prescription for aerobic and resistive training based on initial evaluation findings, risk stratification, comorbidities and participant's personal goals.       Expected Outcomes Short Term: Increase workloads from initial exercise prescription for resistance, speed, and METs.;Short Term: Perform resistance training exercises routinely during rehab and add in resistance training at home;Long Term: Improve cardiorespiratory fitness, muscular endurance and strength as measured by increased METs and functional capacity (6MWT)       Able to understand and use rate of perceived exertion (RPE) scale Yes  Intervention Provide education and explanation on how to use RPE scale       Expected Outcomes Short Term: Able to use RPE daily in rehab to express subjective intensity level;Long Term:  Able to use RPE to guide intensity level when exercising independently       Knowledge and understanding of Target Heart Rate Range (THRR) Yes       Expected Outcomes Short Term: Able to use daily as guideline for intensity in rehab;Long Term: Able to use THRR to govern intensity when exercising independently;Short Term: Able to state/look up THRR       Able to check pulse independently Yes       Intervention Provide education and demonstration on how to check pulse in carotid and radial arteries.;Review the importance of being able to check your own pulse for safety during independent exercise       Expected Outcomes Short Term: Able to explain why pulse checking is important during  independent exercise;Long Term: Able to check pulse independently and accurately       Understanding of Exercise Prescription Yes       Intervention Provide education, explanation, and written materials on patient's individual exercise prescription       Expected Outcomes Short Term: Able to explain program exercise prescription;Long Term: Able to explain home exercise prescription to exercise independently              Exercise Goals Re-Evaluation :  Exercise Goals Re-Evaluation    Row Name 09/27/19 1500             Exercise Goal Re-Evaluation   Exercise Goals Review Increase Physical Activity;Increase Strength and Stamina;Able to understand and use rate of perceived exertion (RPE) scale;Knowledge and understanding of Target Heart Rate Range (THRR);Able to check pulse independently;Understanding of Exercise Prescription       Comments Pt's first day of CRP2 program. PT tolerated the session well with no complaints. Pt understands Ex Rx, THRR, and RPE scale       Expected Outcomes Will continue to monitor and progress patient as tolerated.              Discharge Exercise Prescription (Final Exercise Prescription Changes):  Exercise Prescription Changes - 10/11/19 1600      Response to Exercise   Blood Pressure (Admit) 102/62    Blood Pressure (Exercise) 106/68    Blood Pressure (Exit) 102/60    Heart Rate (Admit) 66 bpm    Heart Rate (Exercise) 91 bpm    Heart Rate (Exit) 57 bpm    Rating of Perceived Exertion (Exercise) 11    Symptoms None    Comments Reviwed METs    Duration Progress to 30 minutes of  aerobic without signs/symptoms of physical distress    Intensity THRR unchanged      Progression   Progression Continue to progress workloads to maintain intensity without signs/symptoms of physical distress.    Average METs 1.7      Resistance Training   Training Prescription No    Weight --   Weights not done on Wednesday. Uses 3 lb wts M-F     Interval Training    Interval Training No      NuStep   Level 1    SPM 75    Minutes 30    METs 1.7           Nutrition:  Target Goals: Understanding of nutrition guidelines, daily intake of sodium 1500mg , cholesterol 200mg , calories 30% from fat  and 7% or less from saturated fats, daily to have 5 or more servings of fruits and vegetables.  Biometrics:  Pre Biometrics - 09/19/19 1415      Pre Biometrics   Waist Circumference 52.5 inches    Hip Circumference 47.5 inches    Waist to Hip Ratio 1.11 %    Triceps Skinfold 33 mm    % Body Fat 40.2 %    Grip Strength 47 kg    Flexibility --   Not done to h/o low back surgery for [facility]   Single Leg Stand --   Due to pt's status of being legally blind, limited perephial vision. Test not done           Nutrition Therapy Plan and Nutrition Goals:  Nutrition Therapy & Goals - 10/03/19 1459      Nutrition Therapy   Diet Heart healthy      Personal Nutrition Goals   Nutrition Goal Pt to build a healthy plate including vegetables, fruits, whole grains, and low-fat dairy products in a heart healthy meal plan.    Personal Goal #2 Pt to identify food quantities necessary to achieve weight loss of 6-24 lb at graduation from cardiac rehab. Pt to weigh and measure serving sizes      Intervention Plan   Intervention Prescribe, educate and counsel regarding individualized specific dietary modifications aiming towards targeted core components such as weight, hypertension, lipid management, diabetes, heart failure and other comorbidities.    Expected Outcomes Short Term Goal: Understand basic principles of dietary content, such as calories, fat, sodium, cholesterol and nutrients.           Nutrition Assessments:   Nutrition Goals Re-Evaluation:  Nutrition Goals Re-Evaluation    [place] Name 10/03/19 1459             Goals   Current Weight 275 lb (124.7 kg)              Nutrition Goals Re-Evaluation:  Nutrition Goals Re-Evaluation    [place]  Name 10/03/19 1459             Goals   Current Weight 275 lb (124.7 kg)              Nutrition Goals Discharge (Final Nutrition Goals Re-Evaluation):  Nutrition Goals Re-Evaluation - 10/03/19 1459      Goals   Current Weight 275 lb (124.7 kg)           Psychosocial: Target Goals: Acknowledge presence or absence of significant depression and/or stress, maximize coping skills, provide positive support system. Participant is able to verbalize types and ability to use techniques and skills needed for reducing stress and depression.  Initial Review & Psychosocial Screening:  Initial Psych Review & Screening - 09/19/19 1523      Initial Review   Current issues with Current Depression;History of Depression      Family Dynamics   Good Support System? 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 States Covid has not helped his feeling of isolation. He has a very supportive mother who lives in [place]. He has a brother, nephews, aunts and uncles as support. He does not drive and relies on public/private transportation. Now that he has CAD, he feels he is limited in his ability to travel out of state. He is financially sound and hopes that once his cardiac function  improves he will be able to take his nephew to [place]. He has no hobbies. He declines referral to [employer]. He declines referral for counseling. States he has declined antidepressants from PCP. PHQ9 score 9      Barriers   Psychosocial barriers to participate in program Psychosocial barriers identified (see note)      Screening Interventions   Interventions Encouraged to exercise;To provide support and resources with identified psychosocial needs;Provide feedback about the scores to participant    Expected Outcomes Short Term goal: Utilizing psychosocial counselor, staff and  physician to assist with identification of specific Stressors or current issues interfering with healing process. Setting desired goal for each stressor or current issue identified.;Long Term Goal: Stressors or current issues are controlled or eliminated.;Short Term goal: Identification and review with participant of any Quality of Life or Depression concerns found by scoring the questionnaire.;Long Term goal: The participant improves quality of Life and PHQ9 Scores as seen by post scores and/or verbalization of changes           Quality of Life Scores:  Quality of Life - 09/19/19 1558      Quality of Life   Select Quality of Life      Quality of Life Scores   Health/Function Pre 14.23 %    Socioeconomic Pre 16.94 %    Psych/Spiritual Pre 16.5 %    Family Pre 15.1 %    GLOBAL Pre 15.4 %          Scores of 19 and below usually indicate a poorer quality of life in these areas.  A difference of  2-3 points is a clinically meaningful difference.  A difference of 2-3 points in the total score of the Quality of Life Index has been associated with significant improvement in overall quality of life, self-image, physical symptoms, and general health in studies assessing change in quality of life.  PHQ-9: Recent Review Flowsheet Data    Depression screen [facility] 2/9 09/19/2019   Decreased Interest 3   Down, Depressed, Hopeless 0   PHQ - 2 Score 3   Altered sleeping 0   Tired, decreased energy 3   Change in appetite 3   Feeling bad or failure about yourself  0   Trouble concentrating 0   Moving slowly or fidgety/restless 0   Suicidal thoughts 0   PHQ-9 Score 9   Difficult doing work/chores Not difficult at all     Interpretation of Total Score  Total Score Depression Severity:  1-4 = Minimal depression, 5-9 = Mild depression, 10-14 = Moderate depression, 15-19 = Moderately severe depression, 20-27 = Severe depression   Psychosocial Evaluation and Intervention:  Psychosocial Evaluation -  09/27/19 1442      Psychosocial Evaluation & Interventions   Comments Jonathon Cain admits to 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 States Covid has not helped his feeling of isolation. He has a very supportive mother who lives in [place]. He has a brother, nephews, aunts and uncles as support. He does not drive and relies on public/private transportation. Now that he has CAD, he feels he is limited in his ability to travel out of state. He is financially sound and hopes that once his cardiac function improves he will be able to take his nephew to [place]. He has no hobbies. He declines referral to industries of the blind. He declines referral for counseling. States he  has declined antidepressants from PCP. PHQ9 score 9    Expected Outcomes Jonathon Cain will continue to manage his depression symptoms independently as long as they do not worsen. He will consider discussing with PCP and request referral for counseling and consideration for medications. He will maintain good communication with [facility] staff related to depression symptoms.           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 States Covid has not helped his feeling of isolation. He has a very supportive mother who lives in [place]. He has a brother, nephews, aunts and uncles as support. He does not drive and relies on public/private transportation. Now that he has CAD, he feels he is limited in his ability to travel out of state. He is financially sound and hopes that once his cardiac function improves he will be able to take his nephew to  [place]. He has no hobbies. He declines referral to industries of the blind. He declines referral for counseling. States he has declined antidepressants from PCP. PHQ9 score 9 upon admission. Jonathon Cain admits to enjoying CR for socialization 3 x week.       Interventions Encouraged to attend Cardiac Rehabilitation for the exercise       Continue Psychosocial Services  Follow up required by staff              Psychosocial Discharge (Final Psychosocial Re-Evaluation):  Psychosocial Re-Evaluation - 10/10/19 1343      Psychosocial Re-Evaluation   Current issues with Current Depression;History of Depression    Comments Jonathon Cain admits to 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 States Covid has not helped his feeling of isolation. He has a very supportive mother who lives in [place]. He has a brother, nephews, aunts and uncles as support. He does not drive and relies on public/private transportation. Now that he has CAD, he feels he is limited in his ability to travel out of state. He is financially sound and hopes that once his cardiac function improves he will be able to take his nephew to [place]. He has no hobbies. He declines referral to industries of the blind. He declines referral for counseling. States he has declined antidepressants from PCP. PHQ9 score 9 upon admission. Jonathon Cain admits to enjoying CR for socialization 3 x week.    Interventions Encouraged to attend Cardiac Rehabilitation for the exercise    Continue Psychosocial Services  Follow up required by staff           Vocational Rehabilitation: Provide vocational rehab assistance to qualifying candidates.   Vocational Rehab Evaluation & Intervention:   Education: Education Goals: Education classes will be provided on a weekly basis, covering required topics. Participant will state understanding/return demonstration of topics  presented.  Learning Barriers/Preferences:  Learning Barriers/Preferences - 09/19/19 1545      Learning Barriers/Preferences   Learning Barriers Sight           Education Topics: Count Your Pulse:  -Group instruction provided by verbal instruction, demonstration, patient participation and written materials to support subject.  Instructors  address importance of being able to find your pulse and how to count your pulse when at home without a heart monitor.  Patients get hands on experience counting their pulse with staff help and individually.   Heart Attack, Angina, and Risk Factor Modification:  -Group instruction provided by verbal instruction, video, and written materials to support subject.  Instructors address signs and symptoms of angina and heart attacks.    Also discuss risk factors for heart disease and how to make changes to improve heart health risk factors.   Functional Fitness:  -Group instruction provided by verbal instruction, demonstration, patient participation, and written materials to support subject.  Instructors address safety measures for doing things around the house.  Discuss how to get up and down off the floor, how to pick things up properly, how to safely get out of a chair without assistance, and balance training.   Meditation and Mindfulness:  -Group instruction provided by verbal instruction, patient participation, and written materials to support subject.  Instructor addresses importance of mindfulness and meditation practice to help reduce stress and improve awareness.  Instructor also leads participants through a meditation exercise.    Stretching for Flexibility and Mobility:  -Group instruction provided by verbal instruction, patient participation, and written materials to support subject.  Instructors lead participants through series of stretches that are designed to increase flexibility thus improving mobility.  These stretches are additional exercise  for major muscle groups that are typically performed during regular warm up and cool down.   Hands Only CPR:  -Group verbal, video, and participation provides a basic overview of AHA guidelines for community CPR. Role-play of emergencies allow participants the opportunity to practice calling for help and chest compression technique with discussion of AED use.   Hypertension: -Group verbal and written instruction that provides a basic overview of hypertension including the most recent diagnostic guidelines, risk factor reduction with self-care instructions and medication management.    Nutrition I class: Heart Healthy Eating:  -Group instruction provided by PowerPoint slides, verbal discussion, and written materials to support subject matter. The instructor gives an explanation and review of the Therapeutic Lifestyle Changes diet recommendations, which includes a discussion on lipid goals, dietary fat, sodium, fiber, plant stanol/sterol esters, sugar, and the components of a well-balanced, healthy diet.   Nutrition II class: Lifestyle Skills:  -Group instruction provided by PowerPoint slides, verbal discussion, and written materials to support subject matter. The instructor gives an explanation and review of label reading, grocery shopping for heart health, heart healthy recipe modifications, and ways to make healthier choices when eating out.   Diabetes Question & Answer:  -Group instruction provided by PowerPoint slides, verbal discussion, and written materials to support subject matter. The instructor gives an explanation and review of diabetes co-morbidities, pre- and post-prandial blood glucose goals, pre-exercise blood glucose goals, signs, symptoms, and treatment of hypoglycemia and hyperglycemia, and foot care basics.   Diabetes Blitz:  -Group instruction provided by PowerPoint slides, verbal discussion, and written materials to support subject matter. The instructor gives an  explanation and review of the physiology behind type 1 and type 2 diabetes, diabetes medications and rational behind using different medications, pre- and post-prandial blood glucose recommendations and Hemoglobin A1c goals, diabetes diet, and exercise including blood glucose guidelines for exercising safely.    Portion Distortion:  -Group instruction provided by PowerPoint slides, verbal discussion, written materials, and food models to support subject matter. The instructor gives an explanation of serving size versus portion size, changes  in portions sizes over the last 20 years, and what consists of a serving from each food group.   Stress Management:  -Group instruction provided by verbal instruction, video, and written materials to support subject matter.  Instructors review role of stress in heart disease and how to cope with stress positively.     Exercising on Your Own:  -Group instruction provided by verbal instruction, power point, and written materials to support subject.  Instructors discuss benefits of exercise, components of exercise, frequency and intensity of exercise, and end points for exercise.  Also discuss use of nitroglycerin and activating EMS.  Review options of places to exercise outside of rehab.  Review guidelines for sex with heart disease.   Cardiac Drugs I:  -Group instruction provided by verbal instruction and written materials to support subject.  Instructor reviews cardiac drug classes: antiplatelets, anticoagulants, beta blockers, and statins.  Instructor discusses reasons, side effects, and lifestyle considerations for each drug class.   Cardiac Drugs II:  -Group instruction provided by verbal instruction and written materials to support subject.  Instructor reviews cardiac drug classes: angiotensin converting enzyme inhibitors (ACE-I), angiotensin II receptor blockers (ARBs), nitrates, and calcium channel blockers.  Instructor discusses reasons, side effects,  and lifestyle considerations for each drug class.   Anatomy and Physiology of the Circulatory System:  Group verbal and written instruction and models provide basic cardiac anatomy and physiology, with the coronary electrical and arterial systems. Review of: AMI, Angina, Valve disease, Heart Failure, Peripheral Artery Disease, Cardiac Arrhythmia, Pacemakers, and the ICD.   Other Education:  -Group or individual verbal, written, or video instructions that support the educational goals of the cardiac rehab program.   Holiday Eating Survival Tips:  -Group instruction provided by PowerPoint slides, verbal discussion, and written materials to support subject matter. The instructor gives patients tips, tricks, and techniques to help them not only survive but enjoy the holidays despite the onslaught of food that accompanies the holidays.   Knowledge Questionnaire Score:  Knowledge Questionnaire Score - 09/19/19 1558      Knowledge Questionnaire Score   Pre Score 22/24           Core Components/Risk Factors/Patient Goals at Admission:  Personal Goals and Risk Factors at Admission - 09/19/19 1545      Core Components/Risk Factors/Patient Goals on Admission    Weight Management Yes;Obesity;Weight Loss    Intervention Weight Management: Develop a combined nutrition and exercise program designed to reach desired caloric intake, while maintaining appropriate intake of nutrient and fiber, sodium and fats, and appropriate energy expenditure required for the weight goal.;Weight Management: Provide education and appropriate resources to help participant work on and attain dietary goals.;Weight Management/Obesity: Establish reasonable short term and long term weight goals.;Obesity: Provide education and appropriate resources to help participant work on and attain dietary goals.    Admit Weight 277 lb 5.4 oz (125.8 kg)    Expected Outcomes Short Term: Continue to assess and modify interventions until  short term weight is achieved;Long Term: Adherence to nutrition and physical activity/exercise program aimed toward attainment of established weight goal;Weight Loss: Understanding of general recommendations for a balanced deficit meal plan, which promotes 1-2 lb weight loss per week and includes a negative energy balance of [phone] kcal/d;Understanding recommendations for meals to include 15-35% energy as protein, 25-35% energy from fat, 35-60% energy from carbohydrates, less than 200mg  of dietary cholesterol, 20-35 gm of total fiber daily;Understanding of distribution of calorie intake throughout the day with the consumption of  4-5 meals/snacks    Lipids Yes    Intervention Provide education and support for participant on nutrition & aerobic/resistive exercise along with prescribed medications to achieve LDL 70mg , HDL >40mg .    Expected Outcomes Short Term: Participant states understanding of desired cholesterol values and is compliant with medications prescribed. Participant is following exercise prescription and nutrition guidelines.;Long Term: Cholesterol controlled with medications as prescribed, with individualized exercise RX and with personalized nutrition plan. Value goals: LDL < 70mg , HDL > 40 mg.           Core Components/Risk Factors/Patient Goals Review:   Goals and Risk Factor Review    Row Name 09/27/19 1444 10/10/19 1345           Core Components/Risk Factors/Patient Goals Review   Personal Goals Review Weight Management/Obesity;Heart Failure;Lipids Weight Management/Obesity;Heart Failure;Lipids      Review Jonathon Cain has several CAD risk factors. He is eager to participate in CR for modification. His goals are to be able to walk to the mailbox and back, walk to neighbors home and take out trash with more stamina, strength, and less shortness of breath. He would like to be able to take nephew to [place] once his strength and endurance have improved. Jonathon Cain has several CAD risk  factors. He is eager to participate in CR for modification. His goals are to be able to walk to the mailbox and back, walk to neighbors home and take out trash with more stamina, strength, and less shortness of breath. He would like to be able to take nephew to [place] once his strength and endurance have improved.      Expected Outcomes Jonathon Cain will continue to participate in CR for risk factor modification and for progression towards personal goals. Jonathon Cain will continue to participate in CR for risk factor modification and for progression towards personal goals.             Core Components/Risk Factors/Patient Goals at Discharge (Final Review):   Goals and Risk Factor Review - 10/10/19 1345      Core Components/Risk Factors/Patient Goals Review   Personal Goals Review Weight Management/Obesity;Heart Failure;Lipids    Review Jonathon Cain has several CAD risk factors. He is eager to participate in CR for modification. His goals are to be able to walk to the mailbox and back, walk to neighbors home and take out trash with more stamina, strength, and less shortness of breath. He would like to be able to take nephew to [place] once his strength and endurance have improved.    Expected Outcomes Jonathon Cain will continue to participate in CR for risk factor modification and for progression towards personal goals.           ITP Comments:  ITP Comments    Row Name 09/19/19 1341 09/27/19 1437 10/10/19 1338       ITP Comments Dr. Fransico Him Medical Director Cardiac [facility] Mr. Knebel completed his first cardiac rehab exercise today and tolerated well. Worked 30 min on NuStep at an RPE of 11. VSS. Patient did complain of left thigh and calf "burning" 4/10 which subsided post exercise during stretches and cool down. 30 day ITP review: Jonathon Cain has completed 6 cardiac rehab exercise sessions since admission. He has had perfect attendance and continues to be eager to participate in CR. He  consistantly works to an RPE of 11-13 with average mets on the NuStep 1.9. He does complain of intermittant calf pain. He is restricted to  seated exercise secondary to very poor vision and requires an assistive device for ambulation safety. VSS and within parameters for exercise. Too soon to evaluate progression towards goals of increasing stamina and strength. Will re-evaluate in 30 days.            Comments: see ITP comments

## 2019-10-11 ENCOUNTER — Other Ambulatory Visit: Payer: Self-pay

## 2019-10-11 ENCOUNTER — Encounter (HOSPITAL_COMMUNITY)
Admission: RE | Admit: 2019-10-11 | Discharge: 2019-10-11 | Disposition: A | Discharge status: Home / Self Care | Payer: Medicare HMO | Source: Ambulatory Visit | Attending: Cardiology | Admitting: Cardiology

## 2019-10-11 DIAGNOSIS — I2109 ST elevation (STEMI) myocardial infarction involving other coronary artery of anterior wall: Secondary | ICD-10-CM | POA: Diagnosis not present

## 2019-10-11 DIAGNOSIS — Z955 Presence of coronary angioplasty implant and graft: Secondary | ICD-10-CM

## 2019-10-13 ENCOUNTER — Encounter (HOSPITAL_COMMUNITY): Payer: Medicare HMO

## 2019-10-13 ENCOUNTER — Telehealth (HOSPITAL_COMMUNITY): Payer: Self-pay | Admitting: Internal Medicine

## 2019-10-16 ENCOUNTER — Encounter (HOSPITAL_COMMUNITY)
Admission: RE | Admit: 2019-10-16 | Discharge: 2019-10-16 | Disposition: A | Discharge status: Home / Self Care | Payer: Medicare HMO | Source: Ambulatory Visit | Attending: Cardiology | Admitting: Cardiology

## 2019-10-16 ENCOUNTER — Other Ambulatory Visit: Payer: Self-pay

## 2019-10-16 DIAGNOSIS — I2109 ST elevation (STEMI) myocardial infarction involving other coronary artery of anterior wall: Secondary | ICD-10-CM | POA: Diagnosis not present

## 2019-10-16 DIAGNOSIS — Z955 Presence of coronary angioplasty implant and graft: Secondary | ICD-10-CM

## 2019-10-17 NOTE — Progress Notes (Signed)
Reviewed home exercise prescription with patient. Due to patient's visual deficits, he walks the ramp that leads to to his deck. He holds the handrail for safety. He usually can only tolerate 10 minutes of this. We discussed that he could do it a couple of times a day in these short 10 minute intervals 2x/wk. We also discussed him using his Wii sking program and boxing program which he has used in the past. We dicussed the benefits of adding some extra days of exercise and he is willing to try it. He was encouraged to take his prescribed medications before engaging in exercise. He was encouraged to have his cell phone with him if outdoors for safety. Weather parameters for temperature and humidity reviewed.  He was encouraged to hydrate before, during and after exercise. Reviewed safe and proper use of NTG and encouraged to carry at all times. Reviewed S/S that would require him to terminate exercise and when to call 911 vs MD. Pt verbalized understanding of exercise prescription and was provided a copy.   Lesly Rubenstein MS, ACSM-EP-C, CCRP

## 2019-10-18 ENCOUNTER — Other Ambulatory Visit: Payer: Self-pay

## 2019-10-18 ENCOUNTER — Encounter (HOSPITAL_COMMUNITY)
Admission: RE | Admit: 2019-10-18 | Discharge: 2019-10-18 | Disposition: A | Discharge status: Home / Self Care | Payer: Medicare HMO | Source: Ambulatory Visit | Attending: Cardiology | Admitting: Cardiology

## 2019-10-18 DIAGNOSIS — I2109 ST elevation (STEMI) myocardial infarction involving other coronary artery of anterior wall: Secondary | ICD-10-CM

## 2019-10-18 DIAGNOSIS — Z955 Presence of coronary angioplasty implant and graft: Secondary | ICD-10-CM

## 2019-10-18 NOTE — Progress Notes (Signed)
Incomplete Session Note  Patient Details  Name: Jonathon Cain MRN: [id number] Date of Birth: 01/11/1961 Referring Provider:     [place] from 09/19/2019 in [place]  Referring Provider [place], [place] did not complete his rehab session.  Jonathon Cain reported having diarrhea this morning at 0800 and took some imodium. Patient advised not to exercise per protocol. Jonathon Cain plans to return to exercise on Friday afternoon if he remains symptom free.Barnet Pall, RN,BSN 10/18/2019 1:40 PM

## 2019-10-20 ENCOUNTER — Encounter (HOSPITAL_COMMUNITY): Payer: Medicare HMO

## 2019-10-23 ENCOUNTER — Encounter (HOSPITAL_COMMUNITY): Payer: Medicare HMO

## 2019-10-24 ENCOUNTER — Ambulatory Visit: Payer: Medicare HMO

## 2019-10-24 ENCOUNTER — Other Ambulatory Visit: Payer: Self-pay

## 2019-10-24 ENCOUNTER — Other Ambulatory Visit: Payer: Medicare HMO

## 2019-10-24 DIAGNOSIS — I5020 Unspecified systolic (congestive) heart failure: Secondary | ICD-10-CM

## 2019-10-25 ENCOUNTER — Other Ambulatory Visit: Payer: Self-pay

## 2019-10-25 ENCOUNTER — Emergency Department (HOSPITAL_COMMUNITY)
Admission: EM | Admit: 2019-10-25 | Discharge: 2019-10-25 | Disposition: A | Discharge status: Home / Self Care | Payer: Medicare HMO | Attending: Emergency Medicine | Admitting: Emergency Medicine

## 2019-10-25 ENCOUNTER — Encounter (HOSPITAL_COMMUNITY): Payer: Self-pay | Admitting: Emergency Medicine

## 2019-10-25 ENCOUNTER — Encounter (HOSPITAL_COMMUNITY)
Admission: RE | Admit: 2019-10-25 | Discharge: 2019-10-25 | Disposition: A | Discharge status: Home / Self Care | Payer: Medicare HMO | Source: Ambulatory Visit | Attending: Cardiology | Admitting: Cardiology

## 2019-10-25 DIAGNOSIS — Z955 Presence of coronary angioplasty implant and graft: Secondary | ICD-10-CM | POA: Insufficient documentation

## 2019-10-25 DIAGNOSIS — I959 Hypotension, unspecified: Secondary | ICD-10-CM | POA: Diagnosis present

## 2019-10-25 DIAGNOSIS — I10 Essential (primary) hypertension: Secondary | ICD-10-CM | POA: Diagnosis not present

## 2019-10-25 DIAGNOSIS — R001 Bradycardia, unspecified: Secondary | ICD-10-CM

## 2019-10-25 DIAGNOSIS — Z7982 Long term (current) use of aspirin: Secondary | ICD-10-CM | POA: Diagnosis not present

## 2019-10-25 DIAGNOSIS — Z79899 Other long term (current) drug therapy: Secondary | ICD-10-CM | POA: Diagnosis not present

## 2019-10-25 DIAGNOSIS — Z87891 Personal history of nicotine dependence: Secondary | ICD-10-CM | POA: Diagnosis not present

## 2019-10-25 DIAGNOSIS — I2109 ST elevation (STEMI) myocardial infarction involving other coronary artery of anterior wall: Secondary | ICD-10-CM

## 2019-10-25 LAB — BASIC METABOLIC PANEL
Anion gap: 9 (ref 5–15)
BUN: 12 mg/dL (ref 8–23)
CO2: 20 mmol/L — ABNORMAL LOW (ref 22–32)
Calcium: 8.5 mg/dL — ABNORMAL LOW (ref 8.9–10.3)
Chloride: 102 mmol/L (ref 98–111)
Creatinine, Ser: 1.21 mg/dL (ref 0.61–1.24)
GFR calc Af Amer: >60 mL/min (ref >60)
GFR calc non Af Amer: >60 mL/min (ref >60)
Glucose, Bld: 101 mg/dL — ABNORMAL HIGH (ref 70–99)
Potassium: 4.3 mmol/L (ref 3.5–5.1)
Sodium: 131 mmol/L — ABNORMAL LOW (ref 135–145)

## 2019-10-25 LAB — URINALYSIS, ROUTINE W REFLEX MICROSCOPIC
Bilirubin Urine: NEGATIVE
Glucose, UA: >=500 mg/dL — AB
Hgb urine dipstick: NEGATIVE
Ketones, ur: NEGATIVE mg/dL
Leukocytes,Ua: NEGATIVE
Nitrite: NEGATIVE
Protein, ur: NEGATIVE mg/dL
Specific Gravity, Urine: 1.01 (ref 1.005–1.030)
pH: 6 (ref 5.0–8.0)

## 2019-10-25 LAB — CBC
HCT: 46 % (ref 39.0–52.0)
Hemoglobin: 14.7 g/dL (ref 13.0–17.0)
MCH: 29.2 pg (ref 26.0–34.0)
MCHC: 32 g/dL (ref 30.0–36.0)
MCV: 91.3 fL (ref 80.0–100.0)
Platelets: 222 10*3/uL (ref 150–400)
RBC: 5.04 MIL/uL (ref 4.22–5.81)
RDW: 14.4 % (ref 11.5–15.5)
WBC: 13.3 10*3/uL — ABNORMAL HIGH (ref 4.0–10.5)
nRBC: 0 % (ref 0.0–0.2)

## 2019-10-25 LAB — CBG MONITORING, ED: Glucose-Capillary: 91 mg/dL (ref 70–99)

## 2019-10-25 MED ORDER — SODIUM CHLORIDE 0.9% FLUSH: 3.0000 mL | Freq: Once | INTRAVENOUS | Status: DC

## 2019-10-25 NOTE — ED Provider Notes (Signed)
[facility] EMERGENCY DEPARTMENT Provider Note   CSN: [id number] Arrival date & time: 10/25/19  1410     History Chief Complaint  Patient presents with  . Hypotension    Jonathon Cain is a 59 y.o. male.  Jonathon Cain is a 59 y.o. male with a history of hypertension, hyperlipidemia, STEMI, CHF with reduced EF with LifeVest, who presents to the emergency from cardiac rehab after he developed bradycardia and hypotension.  The nurse in cardiac rehab noted that patient's heart rate dropped into the 40s, and when she tried to get a blood pressure on him, patient found to have a systolic blood pressure in the 80s.  During this.  Patient states he did not feel unwell, denies any chest pain, shortness of breath, lightheadedness or syncope.  He states that this morning he felt a bit lightheaded and had a Gatorade and when he arrived at cardiac rehab he had a heart rate in the 96V and systolic blood pressure in the 90s.  He is followed by Dr. Virgina Jock with cardiology, had a stent placed after a STEMI in May, has an EF of 20% wears a LifeVest.  States he has been taking all of his medications regularly, has been doing cardiac rehab since July 1.  Has not had significant worsening in lower extremity edema or any chest pain or shortness of breath at home recently.  Denies any palpitations.        Past Medical History:  Diagnosis Date  . Bladder outlet obstruction   . Hyperlipidemia   . Hypertension   . Retinitis pigmentosa     Patient Active Problem List   Diagnosis Date Noted  . HFrEF (heart failure with reduced ejection fraction) ([place]) 08/02/2019  . Coronary artery disease of native artery of native heart with stable angina pectoris ([place]) 08/02/2019  . Acute ST elevation myocardial infarction (STEMI) of anterolateral wall ([place]) 07/26/2019  . Leukocytosis 10/30/2016  . Peripheral vision loss, bilateral 03/05/2016  . Retinitis pigmentosa 12/11/2015  . Acquired night  blindness 08/08/2015  . 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 Epiretinal membrane (ERM) of both eyes 08/08/2015  . 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 Peripheral visual field defect of both eyes 08/08/2015  . 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 Posterior vitreous detachment of both eyes 08/08/2015  . Pseudophakia of both eyes 08/08/2015    Past Surgical History:  Procedure Laterality Date  . CATARACT EXTRACTION Bilateral 2004  . CORONARY STENT INTERVENTION N/A 07/26/2019   Procedure: CORONARY STENT INTERVENTION;  Surgeon: Adrian Prows, MD;  Location: [facility];  Service: Cardiovascular;  Laterality: N/A;  . CORONARY/GRAFT ACUTE MI REVASCULARIZATION N/A 07/26/2019   Procedure: CORONARY/GRAFT ACUTE MI REVASCULARIZATION;  Surgeon: Adrian Prows, MD;  Location: [facility];  Service: Cardiovascular;  Laterality: N/A;  . EYE SURGERY    . LEFT HEART CATH AND CORONARY ANGIOGRAPHY N/A 07/26/2019   Procedure: LEFT HEART CATH AND CORONARY ANGIOGRAPHY;  Surgeon: Adrian Prows, MD;  Location: [facility];  Service: Cardiovascular;  Laterality: N/A;  . 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 Heart disease Mother   . Hyperlipidemia Mother   . Asthma Mother   . Heart disease Father   . Hypertension Father   . Heart disease Maternal Grandmother   . Heart disease Maternal Grandfather   . Heart  disease Paternal Grandmother   . Heart disease Paternal Grandfather     Social History   Tobacco Use  . Smoking status: Former Smoker    Packs/day: 1.00    Years: 10.00    Pack years: 10.00    Types: Cigarettes    Quit date: 07/26/2019    Years since quitting: 0.2  . Smokeless tobacco: Never Used  Vaping Use  . Vaping Use: Never used  Substance Use Topics  . Alcohol use: Yes    Comment: occasional  . Drug use: No    Home Medications Prior to Admission  medications   Medication Sig Start Date End Date Taking? Authorizing Provider  acetaminophen (TYLENOL) 325 MG tablet Take 2 tablets (650 mg total) by mouth every 4 (four) hours as needed for headache or mild pain. 07/28/19  Yes Adrian Prows, MD  aspirin 81 MG chewable tablet Chew 1 tablet (81 mg total) by mouth daily. 07/28/19  Yes Adrian Prows, MD  atorvastatin (LIPITOR) 80 MG tablet Take 1 tablet (80 mg total) by mouth daily. 07/28/19  Yes Adrian Prows, MD  cetirizine (ZYRTEC) 10 MG tablet Take 10 mg by mouth daily as needed for allergies.   Yes [provider]  fluticasone (FLONASE) 50 MCG/ACT nasal spray Place 2 sprays into both nostrils daily as needed for allergies or rhinitis.    Yes [provider]  furosemide (LASIX) 20 MG tablet Take 1 tablet (20 mg total) by mouth daily. 07/28/19  Yes Adrian Prows, MD  metoprolol succinate (TOPROL XL) 25 MG 24 hr tablet Take 0.5 tablets (12.5 mg total) by mouth daily. 08/02/19  Yes Patwardhan, Manish J, MD  nitroGLYCERIN (NITROSTAT) 0.4 MG SL tablet Place 1 tablet (0.4 mg total) under the tongue every 5 (five) minutes as needed for chest pain. 07/28/19  Yes Adrian Prows, MD  pantoprazole (PROTONIX) 40 MG tablet Take 1 tablet (40 mg total) by mouth daily. 08/02/19  Yes Patwardhan, Manish J, MD  sacubitril-valsartan (ENTRESTO) 24-26 MG Take 1 tablet by mouth 2 (two) times daily. 07/28/19  Yes Adrian Prows, MD  spironolactone (ALDACTONE) 25 MG tablet Take 1 tablet (25 mg total) by mouth daily. 09/01/19 11/30/19 Yes Patwardhan, Manish J, MD  ticagrelor (BRILINTA) 90 MG TABS tablet Take 1 tablet (90 mg total) by mouth 2 (two) times daily. 07/28/19  Yes Adrian Prows, MD  latanoprost (XALATAN) 0.005 % ophthalmic solution Place 1 drop into both eyes See admin instructions. Place 1 drop into each eye at bedtime for 2 months on/2 months off- alternate with Timolol Patient not taking: Reported on 09/20/2019 11/24/18 11/24/19  [provider]  timolol (TIMOPTIC) 0.5 % ophthalmic  solution Place 1 drop into both eyes See admin instructions. Place 1 drop into both eyes two times a day for 2 months on/2 months off- alternate with Latanoprost Patient not taking: Reported on 09/20/2019 11/24/18   [provider]    Allergies    Patient has no known allergies.  Review of Systems   Review of Systems  Constitutional: Negative for chills, diaphoresis and fever.  HENT: Negative.   Respiratory: Negative for cough and shortness of breath.   Cardiovascular: Negative for chest pain.  Gastrointestinal: Negative for abdominal pain, nausea and vomiting.  Neurological: Negative for dizziness, syncope, weakness, light-headedness, numbness and headaches.  All other systems reviewed and are negative.   Physical Exam Updated Vital Signs BP 126/67 (BP Location: Right Arm)   Pulse 61   Temp 98.1 F (36.7 C) (Oral)  Resp 17   Ht 5\' 11"  (1.803 m)   Wt 124 kg   SpO2 99%   BMI 38.13 kg/m   Physical Exam Vitals and nursing note reviewed.  Constitutional:      General: He is not in acute distress.    Appearance: Normal appearance. He is well-developed. He is obese. He is not diaphoretic.     Comments: Well-appearing and in no distress  HENT:     Head: Normocephalic and atraumatic.  Eyes:     General:        Right eye: No discharge.        Left eye: No discharge.     Pupils: Pupils are equal, round, and reactive to light.  Cardiovascular:     Rate and Rhythm: Regular rhythm. Bradycardia present.     Pulses: Normal pulses.     Heart sounds: Normal heart sounds. No murmur heard.  No friction rub. No gallop.      Comments: Mildly bradycardic with regular rhythm Pulmonary:     Effort: Pulmonary effort is normal. No respiratory distress.     Breath sounds: Normal breath sounds. No wheezing or rales.     Comments: Respirations equal and unlabored, patient able to speak in full sentences, lungs clear to auscultation bilaterally Abdominal:     General: Bowel sounds are  normal. There is no distension.     Palpations: Abdomen is soft. There is no mass.     Tenderness: There is no abdominal tenderness. There is no guarding.     Comments: Abdomen soft, nondistended, nontender to palpation in all quadrants without guarding or peritoneal signs  Musculoskeletal:        General: No deformity.     Cervical back: Neck supple.     Right lower leg: No edema.     Left lower leg: No edema.  Skin:    General: Skin is warm and dry.     Capillary Refill: Capillary refill takes less than 2 seconds.  Neurological:     Mental Status: He is alert.     Coordination: Coordination normal.     Comments: Speech is clear, able to follow commands Moves extremities without ataxia, coordination intact  Psychiatric:        Mood and Affect: Mood normal.        Behavior: Behavior normal.     ED Results / Procedures / Treatments   Labs (all labs ordered are listed, but only abnormal results are displayed) Labs Reviewed  BASIC METABOLIC PANEL - Abnormal; Notable for the following components:      Result Value   Sodium 131 (*)    CO2 20 (*)    Glucose, Bld 101 (*)    Calcium 8.5 (*)    All other components within normal limits  CBC - Abnormal; Notable for the following components:   WBC 13.3 (*)    All other components within normal limits  URINALYSIS, ROUTINE W REFLEX MICROSCOPIC - Abnormal; Notable for the following components:   Glucose, UA >=500 (*)    Bacteria, UA RARE (*)    All other components within normal limits  CBG MONITORING, ED    EKG None  Radiology No results found.  Procedures Procedures (including critical care time)  Medications Ordered in ED Medications  sodium chloride flush (NS) 0.9 % injection 3 mL (has no administration in time range)    ED Course  I have reviewed the triage vital signs and the nursing notes.  Pertinent labs &  imaging results that were available during my care of the patient were reviewed by me and considered in my  medical decision making (see chart for details).    MDM Rules/Calculators/A&P                         59 year old male arrives from cardiac rehab for bradycardia and hypotension, patient was exercising today when his heart rate dropped into the 40s and nurse had a difficult time getting a blood pressure, found to be in the 80s, during this time patient was not symptomatic and reported that he felt well, no syncope or near syncope.  No associated chest pain or shortness of breath.  In May patient had stent placed and has EF of 20%, with LifeVest, he has been doing cardiac rehab for about a month now.  Does report that this morning when he woke up he felt a bit lightheaded and when he arrived at cardiac rehab he had heart rate in the 50s and pressure in the 90s.  Patient remains alert, well-appearing, pressure is back at baseline here in the ED with heart rate in the 50s, EKG without concerning changes.  Patient denies any chest pain or shortness of breath.  Will get basic labs and discussed with patient's cardiologist.  Dr. Virgina Jock has seen and evaluated patient in the ED, feels that he looks well His lab work is reassuring patient can be discharged home with close follow-up with him in the office.  Lab work with very slight hyponatremia 131, CO2 of 20, glucose of 101, normal renal function, mild leukocytosis of 13.3, normal hemoglobin, urinalysis without evidence of infection, normal CBG.  Lab work is very reassuring, patient is feeling well, he has been ambulatory in the department with no syncopal symptoms.  Pulse in the 50s which appears to be his baseline, normotensive.  At this time patient is stable for discharge home with close follow-up outpatient with his cardiologist.  Return precautions discussed.  Patient expresses agreement.  Discharged home in good condition.  Final Clinical Impression(s) / ED Diagnoses Final diagnoses:  Bradycardia  Hypotensive episode    Rx / DC Orders ED  Discharge Orders    None       Janet Berlin 10/25/19 2054    Lucrezia Starch, MD 10/26/19 0003

## 2019-10-25 NOTE — Discharge Instructions (Signed)
Your lab work has returned looking good today and your heart rate and blood pressure returned to normal.  Make sure you are drinking fluids and eating a meal prior to exercising.  Follow-up with Dr. Virgina Jock next week in the clinic as planned.

## 2019-10-25 NOTE — ED Triage Notes (Signed)
Pt brought to ED from Cardiac Rehab, pt started exercising there and his BP dropped to SBP 80 and HR dropped to 44, pt arrive to ED on stretcher and with the zol on him. No LOC. 98% RA.

## 2019-10-25 NOTE — Progress Notes (Signed)
Incomplete Session Note  Patient Details  Name: Jonathon Cain MRN: [id number] Date of Birth: 10-30-1960 Referring Provider:     Central from 09/19/2019 in [place]  Referring Provider [place], [place] did not complete his rehab session.  Entry blood pressure 94/56 heart rate 50. Patient proceeded to exercise on the nustep. Heart rate initially noted in the lower 50's then in the mid 40's. Upon blood pressure check systolic BP noted in the 96'P. Heart rate noted at 44, sinus brady.Oxygen saturation 98% on room air. Attempted to get automatic blood pressure with no results. Patient generally asymptomatic but said that he felt  that he felt dizzy earlier at home. Exercise stopped. Dr Virgina Jock paged and notified. Patient taken to the ED for further evaluation via stretcher on the [place]. . Patient is wearing his life vest. The ED was called and notified about the pending transfer. Attempted to call to speak to charge nurse who was unavailable by phone. Report was given to ED RN, Denese Killings. Patient was then taken to room 34 after vital signs checked. Update Shawn PA and [employer]. Patient called his brother to notify about today's events.Barnet Pall, RN,BSN 10/25/2019 3:08 PM

## 2019-10-25 NOTE — Consult Note (Signed)
CARDIOLOGY CONSULT NOTE  Patient ID: Jonathon Cain MRN: [id number] DOB/AGE: 1960/06/18 59 y.o.  Admit date: 10/25/2019 Referring Physician: Cardiac rehab/[facility] Reason for Consultation: Bradycardia, hypertension  HPI:   59 y.o. 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  Patient felt lightheaded this morning.  Symptoms improved after drinking Gatorade.  He presented to cardiac rehab for his routine exercise.  While at cardiac rehab, he was found to be bradycardic in 45Y, with systolic blood pressure on 80 mmHg.  At that time, he did not have any other symptoms.  He was sent to ED for further evaluation.  While in the ED, his resting heart rate has been greater than 50 bpm.  Blood pressure is normal.  Patient states that he "does not feel terrible".  He denies any chest pain or shortness of breath.  Is compliant with his baseline medical therapy-including aspirin, Brilinta, statin, metoprolol succinate 12.5 mg daily, spironolactone 25 mg daily, Entresto 24-26 mg twice daily. He has not missed any doses.    Patient is enrolled in EMPACT MI clinical trial.   Past Medical History:  Diagnosis Date  . Bladder outlet obstruction   . Hyperlipidemia   . Hypertension   . Retinitis pigmentosa      Past Surgical History:  Procedure Laterality Date  . CATARACT EXTRACTION Bilateral 2004  . CORONARY STENT INTERVENTION N/A 07/26/2019   Procedure: CORONARY STENT INTERVENTION;  Surgeon: Adrian Prows, MD;  Location: [facility];  Service: Cardiovascular;  Laterality: N/A;  . CORONARY/GRAFT ACUTE MI REVASCULARIZATION N/A 07/26/2019   Procedure: CORONARY/GRAFT ACUTE MI REVASCULARIZATION;  Surgeon: Adrian Prows, MD;  Location: [facility];  Service: Cardiovascular;  Laterality: N/A;  . EYE SURGERY    . LEFT HEART  CATH AND CORONARY ANGIOGRAPHY N/A 07/26/2019   Procedure: LEFT HEART CATH AND CORONARY ANGIOGRAPHY;  Surgeon: Adrian Prows, MD;  Location: [facility];  Service: Cardiovascular;  Laterality: N/A;  . TUMOR REMOVAL  1961   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     Family History  Problem Relation Age of Onset  . Heart disease Mother   . Hyperlipidemia Mother   . Asthma Mother   . Heart disease Father   . Hypertension Father   . Heart disease Maternal Grandmother   . Heart disease Maternal Grandfather   . Heart disease Paternal Grandmother   . Heart disease Paternal Grandfather      Social History: Social History   Socioeconomic History  . Marital status: Single    Spouse name: Not on file  . Number of children: 0  . Years of education: Not on file  . Highest education level: Not on file  Occupational History  . Not on file  Tobacco Use  . Smoking status: Former Smoker    Packs/day: 1.00    Years: 10.00    Pack years: 10.00    Types: Cigarettes    Quit date: 07/26/2019    Years since quitting: 0.2  . Smokeless tobacco: Never Used  Vaping Use  . Vaping Use: Never used  Substance and Sexual Activity  . Alcohol use: Yes    Comment: occasional  . Drug use: No  . Sexual activity: Not Currently  Other Topics Concern  . Not on file  Social History Narrative  . Not on file   Social Determinants of  Health   Financial Resource Strain:   . Difficulty of Paying Living Expenses:   Food Insecurity:   . Worried About Charity fundraiser in the Last Year:   . Arboriculturist in the Last Year:   Transportation Needs:   . Film/video editor (Medical):   [name] Kitchen Lack of Transportation (Non-Medical):   Physical Activity:   . Days of Exercise per Week:   . Minutes of Exercise per Session:   Stress:   . Feeling of Stress :   Social Connections:   . Frequency of Communication with Friends and Family:   . Frequency of Social Gatherings with Friends and Family:   . Attends  Religious Services:   . Active Member of Clubs or Organizations:   . Attends Archivist Meetings:   [name] Kitchen Marital Status:   Intimate Partner Violence:   . Fear of Current or Ex-Partner:   . Emotionally Abused:   [name] Kitchen Physically Abused:   . Sexually Abused:      Review of Systems  Constitutional: Negative for decreased appetite, malaise/fatigue, weight gain and weight loss.  HENT: Negative for congestion.   Eyes: Negative for visual disturbance.  Cardiovascular: Negative for chest pain, dyspnea on exertion, leg swelling, palpitations and syncope.  Respiratory: Negative for cough.   Endocrine: Negative for cold intolerance.  Hematologic/Lymphatic: Does not bruise/bleed easily.  Skin: Negative for itching and rash.  Musculoskeletal: Negative for myalgias.  Gastrointestinal: Negative for abdominal pain, nausea and vomiting.  Genitourinary: Negative for dysuria.  Neurological: Positive for light-headedness (Now resolved). Negative for dizziness and weakness.  Psychiatric/Behavioral: The patient is not nervous/anxious.   All other systems reviewed and are negative.     Physical Exam: Physical Exam Vitals and nursing note reviewed.  Constitutional:      General: He is not in acute distress.    Appearance: He is well-developed.  HENT:     Head: Normocephalic and atraumatic.  Eyes:     Conjunctiva/sclera: Conjunctivae normal.     Pupils: Pupils are equal, round, and reactive to light.  Neck:     Vascular: No JVD.  Cardiovascular:     Rate and Rhythm: Normal rate and regular rhythm.     Pulses: Normal pulses and intact distal pulses.     Heart sounds: No murmur heard.   Pulmonary:     Effort: Pulmonary effort is normal.     Breath sounds: Normal breath sounds. No wheezing or rales.  Abdominal:     General: Bowel sounds are normal.     Palpations: Abdomen is soft.     Tenderness: There is no rebound.  Musculoskeletal:        General: No tenderness. Normal range of  motion.     Left lower leg: No edema.  Lymphadenopathy:     Cervical: No cervical adenopathy.  Skin:    General: Skin is warm and dry.  Neurological:     Mental Status: He is alert and oriented to person, place, and time.     Cranial Nerves: No cranial nerve deficit.      Labs:   Lab Results  Component Value Date   WBC 13.3 (H) 10/25/2019   HGB 14.7 10/25/2019   HCT 46.0 10/25/2019   MCV 91.3 10/25/2019   PLT 222 10/25/2019   No results for input(s): NA, K, CL, CO2, BUN, CREATININE, CALCIUM, PROT, BILITOT, ALKPHOS, ALT, AST, GLUCOSE in the last 168 hours.  Invalid input(s): LABALBU  Lipid Panel  Component Value Date/Time   CHOL 230 (H) 07/26/2019 1854   TRIG 95 07/26/2019 1854   HDL 36 (L) 07/26/2019 1854   CHOLHDL 6.4 07/26/2019 1854   VLDL 19 07/26/2019 1854   LDLCALC 175 (H) 07/26/2019 1854    HEMOGLOBIN A1C Lab Results  Component Value Date   HGBA1C 5.5 07/26/2019   MPG 111.15 07/26/2019     Lab Results  Component Value Date   CKTOTAL 329 (H) 06/30/2010   CKMB 1.5 06/30/2010     TSH No results for input(s): TSH in the last 8760 hours.    Radiology: No results found.  Scheduled Meds: . sodium chloride flush  3 mL Intravenous Once   Continuous Infusions: PRN Meds:.  CARDIAC STUDIES:  EKG 10/25/2019: Sinus rhythm 66 bpm. First-degree AV block. LAFB. No change compared to previous EKG.  EKG 08/02/2019: Sinus rhythm 52 bpm. Extensive anterolateral infarct, IVCD.  Echocardiogram 07/27/2019: 1. Left ventricular ejection fraction, by estimation, is 20 to 25%. The left ventricle  has severely decreased function. The left ventricle demonstrates regional wall  motion abnormalities (see scoring diagram/findings for description).  Left ventricular diastolic parameters are consistent with Grade II diastolic dysfunction  (pseudonormalization). Elevated left ventricular end-diastolic pressure.  There is akinesis of the left ventricular, entire  anteroseptal wall,  inferoseptal wall, anterior wall and apical segment.  2. Right ventricular systolic function is normal. The right ventricular size is normal.  3. The mitral valve is normal in structure. Mild mitral valve regurgitation.  4. The aortic valve is normal in structure. Aortic valve regurgitation is not visualized.  No aortic stenosis is present.  5. The inferior vena cava is dilated in size with <50% respiratory variability,  suggesting right atrial pressure of 15 mmHg.   Left Heart Catheterization 07/26/19 (Dr. Einar Gip): LM: Mild diffuse disease LAD: Very large caliber vessel, occluded at the proximal end. Gives origin to high D1 which is large. Successful PTCA and stenting of the proximal LAD overlapping D1, 3.5 x 24 mm Synergy DES deployed at 12 atmospheric pressure for 60 S. 100% to 0% with TIMI 0 to TIMI-3 flow. LCx: Large vessel, giving origin to a very large OM1 which is secondary branches. Ostium of OM1 has 80% stenosis. RCA: Mild diffuse disease, distal RCA 15 to 20% stenosis, PDA 50% stenosis.   Assessment & Recommendations:  59 y.o. 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  Transient bradycardia, hypotension, possible related to dehydration.  His baseline heart rate and blood pressure are always low normal.  Patient is currently asymptomatic and is hemodynamically stable.  I do not see any indication for hospitalization, unless labs show any significant abnormalities.  No changes in his baseline cardiac medications.  Patient advised to keep his follow-up visit with me next week.  Patient is enrolled in EMPACT MI clinical trial (Dapaglifozin vs placebo). No change made.  Nigel Mormon, MD 10/25/2019, 3:34 PM [facility]. [facility] Pager: [phone] Office:  [phone] If no answer Cell [phone]

## 2019-10-27 ENCOUNTER — Telehealth (HOSPITAL_COMMUNITY): Payer: Self-pay

## 2019-10-27 ENCOUNTER — Encounter (HOSPITAL_COMMUNITY): Payer: Medicare HMO

## 2019-10-27 NOTE — Telephone Encounter (Signed)
Cardiac Rehab Note:  Incoming call from Mr. Jonathon Cain as requested by [facility] RN. Informed Jonathon Cain that Dr. Virgina Jock has given medical clearance for patient to return to cardiac rehab exercise 10/30/19. Patient states he is feeling well and happy to return. Will continue to monitor patient closely during exercise and report any additional cardiac symptoms to cardiologist.  Clois Dupes. Rollene Rotunda RN, BSN [place]. [facility]  Cardiac and Pulmonary Rehabilitation Phone: [phone] Fax: [phone]

## 2019-10-27 NOTE — Telephone Encounter (Signed)
Cardiac Rehab Note:  Incoming call from Dr. Virgina Jock to discuss patients recent visit to ED after being bradycardic and hypotensive in cardiac rehab 10/25/19. Gave clearance for patient to return to cardiac rehab 10/30/19 prior to cardiology appointment 11/02/19.  Attempted to notify patient via telephone. [employer] compliant VM message left requesting call back to [phone].  Juanpablo Ciresi E. Rollene Rotunda RN, BSN [place]. [facility]  Cardiac and Pulmonary Rehabilitation Phone: [phone] Fax: [phone]

## 2019-10-29 ENCOUNTER — Other Ambulatory Visit: Payer: Self-pay | Admitting: Cardiology

## 2019-10-29 DIAGNOSIS — I25118 Atherosclerotic heart disease of native coronary artery with other forms of angina pectoris: Secondary | ICD-10-CM

## 2019-10-30 ENCOUNTER — Encounter (HOSPITAL_COMMUNITY)
Admission: RE | Admit: 2019-10-30 | Discharge: 2019-10-30 | Disposition: A | Discharge status: Home / Self Care | Payer: Medicare HMO | Source: Ambulatory Visit | Attending: Cardiology | Admitting: Cardiology

## 2019-10-30 ENCOUNTER — Other Ambulatory Visit: Payer: Self-pay

## 2019-10-30 DIAGNOSIS — I2109 ST elevation (STEMI) myocardial infarction involving other coronary artery of anterior wall: Secondary | ICD-10-CM | POA: Diagnosis present

## 2019-10-30 DIAGNOSIS — Z955 Presence of coronary angioplasty implant and graft: Secondary | ICD-10-CM

## 2019-10-31 NOTE — Progress Notes (Signed)
Cardiac Individual Treatment Plan  Patient Details  Name: Jonathon Cain MRN: [id number] Date of Birth: 03/05/1961 Referring Provider:     [place] from 09/19/2019 in [place]  Referring Provider [place], [place]      Initial Encounter Date:    CARDIAC REHAB PHASE II ORIENTATION from 09/19/2019 in [place]  Date 09/19/19      Visit Diagnosis: Status post coronary artery stent placement  Acute ST elevation myocardial infarction (STEMI) of anterolateral wall (HCC)  Patient's Home Medications on Admission:  Current Outpatient Medications:  .  acetaminophen (TYLENOL) 325 MG tablet, Take 2 tablets (650 mg total) by mouth every 4 (four) hours as needed for headache or mild pain., Disp:  , Rfl:  .  aspirin 81 MG chewable tablet, Chew 1 tablet (81 mg total) by mouth daily., Disp: 30 tablet, Rfl: 11 .  atorvastatin (LIPITOR) 80 MG tablet, Take 1 tablet (80 mg total) by mouth daily., Disp: 30 tablet, Rfl: 3 .  cetirizine (ZYRTEC) 10 MG tablet, Take 10 mg by mouth daily as needed for allergies., Disp: , Rfl:  .  fluticasone (FLONASE) 50 MCG/ACT nasal spray, Place 2 sprays into both nostrils daily as needed for allergies or rhinitis. , Disp: , Rfl:  .  furosemide (LASIX) 20 MG tablet, Take 1 tablet (20 mg total) by mouth daily., Disp: 30 tablet, Rfl: 1 .  latanoprost (XALATAN) 0.005 % ophthalmic solution, Place 1 drop into both eyes See admin instructions. Place 1 drop into each eye at bedtime for 2 months on/2 months off- alternate with Timolol (Patient not taking: Reported on 09/20/2019), Disp: , Rfl:  .  metoprolol succinate (TOPROL XL) 25 MG 24 hr tablet, Take 0.5 tablets (12.5 mg total) by mouth daily., Disp: 30 tablet, Rfl: 2 .  nitroGLYCERIN (NITROSTAT) 0.4 MG SL tablet, Place 1 tablet (0.4 mg total) under the tongue every 5 (five) minutes as needed for chest pain., Disp: 25 tablet, Rfl: 2 .   pantoprazole (PROTONIX) 40 MG tablet, TAKE 1 TABLET(40 MG) BY MOUTH DAILY, Disp: 30 tablet, Rfl: 2 .  sacubitril-valsartan (ENTRESTO) 24-26 MG, Take 1 tablet by mouth 2 (two) times daily., Disp: 60 tablet, Rfl: 1 .  spironolactone (ALDACTONE) 25 MG tablet, Take 1 tablet (25 mg total) by mouth daily., Disp: 90 tablet, Rfl: 3 .  ticagrelor (BRILINTA) 90 MG TABS tablet, Take 1 tablet (90 mg total) by mouth 2 (two) times daily., Disp: 60 tablet, Rfl: 11 .  timolol (TIMOPTIC) 0.5 % ophthalmic solution, Place 1 drop into both eyes See admin instructions. Place 1 drop into both eyes two times a day for 2 months on/2 months off- alternate with Latanoprost (Patient not taking: Reported on 09/20/2019), Disp: , Rfl:   Past Medical History: Past Medical History:  Diagnosis Date  . Bladder outlet obstruction   . Hyperlipidemia   . Hypertension   . Retinitis pigmentosa     Tobacco Use: Social History   Tobacco Use  Smoking Status Former Smoker  . Packs/day: 1.00  . Years: 10.00  . Pack years: 10.00  . Types: Cigarettes  . Quit date: 07/26/2019  . Years since quitting: 0.2  Smokeless Tobacco Never Used    Labs: Recent Review Scientist, physiological    Labs for ITP Cardiac and Pulmonary Rehab Latest Ref Rng & Units 07/26/2019 07/26/2019 07/26/2019   Cholestrol 0 - 200 mg/dL 230(H) - -   LDLCALC 0 - 99 mg/dL  175(H) - -   HDL >40 mg/dL 36(L) - -   Trlycerides <150 mg/dL 95 - -   Hemoglobin A1c 4.8 - 5.6 % 5.5 - -   PHART 7.35 - 7.45 - - 7.436   PCO2ART 32 - 48 mmHg - - 38.0   HCO3 20.0 - 28.0 mmol/L - - 25.6   TCO2 22 - 32 mmol/L - 28 27   O2SAT % - - 99.0      Capillary Blood Glucose: Lab Results  Component Value Date   GLUCAP 91 10/25/2019     Exercise Target Goals: Exercise Program Goal: Individual exercise prescription set using results from initial 6 min walk test and THRR while considering  patient's activity barriers and safety.   Exercise Prescription Goal: Initial exercise prescription  builds to 30-45 minutes a day of aerobic activity, 2-3 days per week.  Home exercise guidelines will be given to patient during program as part of exercise prescription that the participant will acknowledge.  Activity Barriers & Risk Stratification:  Activity Barriers & Cardiac Risk Stratification - 09/19/19 1532      Activity Barriers & Cardiac Risk Stratification   Activity Barriers Deconditioning;Decreased Ventricular Function;Balance Concerns;Assistive Device;Other (comment)   Legally blind   Comments Legally blind, has loss of perpherial vision. Uses cane    Cardiac Risk Stratification High           6 Minute Walk:  6 Minute Walk    Row Name 09/19/19 1438         6 Minute Walk   Phase Initial     Distance 900 feet     Walk Time 6 minutes     # of Rest Breaks 1     MPH 1.7     METS 1.79     RPE 9     Perceived Dyspnea  1     VO2 Peak 6.29     Symptoms Yes (comment)     Comments Bilateral leg pain (4 on 0-10 scale). SOB, RPD = 1     Resting HR 56 bpm     Resting BP 94/60     Resting Oxygen Saturation  97 %     Exercise Oxygen Saturation  during 6 min walk 97 %     Max Ex. HR 92 bpm     Max Ex. BP 108/63     2 Minute Post BP 115/73            Oxygen Initial Assessment:   Oxygen Re-Evaluation:   Oxygen Discharge (Final Oxygen Re-Evaluation):   Initial Exercise Prescription:  Initial Exercise Prescription - 09/19/19 1500      Date of Initial Exercise RX and Referring Provider   Date 09/19/19    Referring Provider Vernell Leep    Expected Discharge Date 11/17/19      NuStep   Level 1    SPM 60    Minutes 30    METs 1.7      Prescription Details   Frequency (times per week) 3    Duration Progress to 30 minutes of continuous aerobic without signs/symptoms of physical distress      Intensity   THRR 40-80% of Max Heartrate 57-114    Ratings of Perceived Exertion 11-13    Perceived Dyspnea 0-4      Progression   Progression Continue  progressive overload as per policy without signs/symptoms or physical distress.      Resistance Training   Training Prescription Yes  Weight 3    Reps 10-15           Perform Capillary Blood Glucose checks as needed.  Exercise Prescription Changes:   Exercise Prescription Changes    Row Name 09/27/19 1450 10/11/19 1600 11/01/19 1600         Response to Exercise   Blood Pressure (Admit) 90/50 102/62 120/70     Blood Pressure (Exercise) 128/73 106/68 108/70     Blood Pressure (Exit) 107/70 102/60 102/64     Heart Rate (Admit) 72 bpm 66 bpm 79 bpm     Heart Rate (Exercise) 83 bpm 91 bpm 83 bpm     Heart Rate (Exit) 57 bpm 57 bpm 63 bpm     Rating of Perceived Exertion (Exercise) 9 11 11      Symptoms None None None     Comments Pt's first day of exercise. Tolerated well Reviwed METs Reviewed METS and Goals     Duration Progress to 30 minutes of  aerobic without signs/symptoms of physical distress Progress to 30 minutes of  aerobic without signs/symptoms of physical distress Progress to 30 minutes of  aerobic without signs/symptoms of physical distress     Intensity THRR unchanged THRR unchanged THRR unchanged       Progression   Progression Continue to progress workloads to maintain intensity without signs/symptoms of physical distress. Continue to progress workloads to maintain intensity without signs/symptoms of physical distress. Continue to progress workloads to maintain intensity without signs/symptoms of physical distress.     Average METs 1.7 1.7 --       Resistance Training   Training Prescription No No No     Weight --  No wegits done on Wednesday. Pt will begin 3 lb wts 09/29/19 --  Weights not done on Wednesday. Uses 3 lb wts M-F --  Uses 3 lb wts on M, F       Interval Training   Interval Training No No No       NuStep   Level 1 1 2      SPM 75 75 75     Minutes 30 30 30      METs 1.7 1.7 2       Home Exercise Plan   Plans to continue exercise at -- -- Home  (comment)     Frequency -- -- Add 2 additional days to program exercise sessions.     Initial Home Exercises Provided -- -- 10/16/19            Exercise Comments:   Exercise Comments    Row Name 09/27/19 1503 10/11/19 1618 10/16/19 1415 11/01/19 1622     Exercise Comments Pt's first day of exercise. Pt tolerated session well with no complaints. Reviewed METs today. Reviewed home exercise program with patient and provied pt a copy. Pt verblaized understnading. See Notes. Reviewed METs and goals. Walking at home has been limited by the heat and humidity outdoors. However, a short term goal the patient had was to be able to walk to his mailbox. He can now do that but still finds coming back to the house diffiuclt.           Exercise Goals and Review:   Exercise Goals    Row Name 09/19/19 1537             Exercise Goals   Increase Physical Activity Yes       Expected Outcomes Short Term: Attend rehab on a regular basis to increase amount of  physical activity.;Long Term: Add in home exercise to make exercise part of routine and to increase amount of physical activity.;Long Term: Exercising regularly at least 3-5 days a week.       Increase Strength and Stamina Yes       Intervention Provide advice, education, support and counseling about physical activity/exercise needs.;Develop an individualized exercise prescription for aerobic and resistive training based on initial evaluation findings, risk stratification, comorbidities and participant's personal goals.       Expected Outcomes Short Term: Increase workloads from initial exercise prescription for resistance, speed, and METs.;Short Term: Perform resistance training exercises routinely during rehab and add in resistance training at home;Long Term: Improve cardiorespiratory fitness, muscular endurance and strength as measured by increased METs and functional capacity (6MWT)       Able to understand and use rate of perceived exertion  (RPE) scale Yes       Intervention Provide education and explanation on how to use RPE scale       Expected Outcomes Short Term: Able to use RPE daily in rehab to express subjective intensity level;Long Term:  Able to use RPE to guide intensity level when exercising independently       Knowledge and understanding of Target Heart Rate Range (THRR) Yes       Expected Outcomes Short Term: Able to use daily as guideline for intensity in rehab;Long Term: Able to use THRR to govern intensity when exercising independently;Short Term: Able to state/look up THRR       Able to check pulse independently Yes       Intervention Provide education and demonstration on how to check pulse in carotid and radial arteries.;Review the importance of being able to check your own pulse for safety during independent exercise       Expected Outcomes Short Term: Able to explain why pulse checking is important during independent exercise;Long Term: Able to check pulse independently and accurately       Understanding of Exercise Prescription Yes       Intervention Provide education, explanation, and written materials on patient's individual exercise prescription       Expected Outcomes Short Term: Able to explain program exercise prescription;Long Term: Able to explain home exercise prescription to exercise independently              Exercise Goals Re-Evaluation :  Exercise Goals Re-Evaluation    Row Name 09/27/19 1500 10/16/19 1415 11/01/19 1619         Exercise Goal Re-Evaluation   Exercise Goals Review Increase Physical Activity;Increase Strength and Stamina;Able to understand and use rate of perceived exertion (RPE) scale;Knowledge and understanding of Target Heart Rate Range (THRR);Able to check pulse independently;Understanding of Exercise Prescription Increase Physical Activity;Increase Strength and Stamina;Able to understand and use rate of perceived exertion (RPE) scale;Knowledge and understanding of Target Heart  Rate Range (THRR);Able to check pulse independently;Understanding of Exercise Prescription Increase Physical Activity;Increase Strength and Stamina;Able to understand and use rate of perceived exertion (RPE) scale;Knowledge and understanding of Target Heart Rate Range (THRR);Able to check pulse independently;Understanding of Exercise Prescription     Comments Pt's first day of CRP2 program. PT tolerated the session well with no complaints. Pt understands Ex Rx, THRR, and RPE scale Reviwed home exercise Rx with patient. Due to patients vision loss he walks on the ramp to his deck and holds the handle for safety. He usually does this for about 10 minutes.We discusssed doing it more than once a day and he may  try to do that.H e also has a Wii and will does the sking or boxing for exercise. Due to his visual limitations this could be an adjuct to the walking. Reviwed METs and goals. Pt continues to try and walk but is limited by the hot, humid weather. Voices walking in his driveway. Has not used Wii at home,. Will continue to encourage any amount of additional physical activity.     Expected Outcomes Will continue to monitor and progress patient as tolerated. Pt will exercise at home 2x/wk by walking the ramp on his deck or using his Wii for a total of 30 minutes of exercise. Will exercise where he a cumulative total of 30 minutes 2 x/wk.            Discharge Exercise Prescription (Final Exercise Prescription Changes):  Exercise Prescription Changes - 11/01/19 1600      Response to Exercise   Blood Pressure (Admit) 120/70    Blood Pressure (Exercise) 108/70    Blood Pressure (Exit) 102/64    Heart Rate (Admit) 79 bpm    Heart Rate (Exercise) 83 bpm    Heart Rate (Exit) 63 bpm    Rating of Perceived Exertion (Exercise) 11    Symptoms None    Comments Reviewed METS and Goals    Duration Progress to 30 minutes of  aerobic without signs/symptoms of physical distress    Intensity THRR unchanged       Progression   Progression Continue to progress workloads to maintain intensity without signs/symptoms of physical distress.      Resistance Training   Training Prescription No    Weight --   Uses 3 lb wts on M, F     Interval Training   Interval Training No      NuStep   Level 2    SPM 75    Minutes 30    METs 2      Home Exercise Plan   Plans to continue exercise at Home (comment)    Frequency Add 2 additional days to program exercise sessions.    Initial Home Exercises Provided 10/16/19           Nutrition:  Target Goals: Understanding of nutrition guidelines, daily intake of sodium 1500mg , cholesterol 200mg , calories 30% from fat and 7% or less from saturated fats, daily to have 5 or more servings of fruits and vegetables.  Biometrics:  Pre Biometrics - 09/19/19 1415      Pre Biometrics   Waist Circumference 52.5 inches    Hip Circumference 47.5 inches    Waist to Hip Ratio 1.11 %    Triceps Skinfold 33 mm    % Body Fat 40.2 %    Grip Strength 47 kg    Flexibility --   Not done to h/o low back surgery for [facility]   Single Leg Stand --   Due to pt's status of being legally blind, limited perephial vision. Test not done           Nutrition Therapy Plan and Nutrition Goals:  Nutrition Therapy & Goals - 10/03/19 1459      Nutrition Therapy   Diet Heart healthy      Personal Nutrition Goals   Nutrition Goal Pt to build a healthy plate including vegetables, fruits, whole grains, and low-fat dairy products in a heart healthy meal plan.    Personal Goal #2 Pt to identify food quantities necessary to achieve weight loss of 6-24 lb at graduation from cardiac  rehab. Pt to weigh and measure serving sizes      Intervention Plan   Intervention Prescribe, educate and counsel regarding individualized specific dietary modifications aiming towards targeted core components such as weight, hypertension, lipid management, diabetes, heart failure and other comorbidities.      Expected Outcomes Short Term Goal: Understand basic principles of dietary content, such as calories, fat, sodium, cholesterol and nutrients.           Nutrition Assessments:   Nutrition Goals Re-Evaluation:  Nutrition Goals Re-Evaluation    [place] Name 10/03/19 1459 10/31/19 1335           Goals   Current Weight 275 lb (124.7 kg) 273 lb (123.8 kg)      Nutrition Goal -- Pt to build a healthy plate including vegetables, fruits, whole grains, and low-fat dairy products in a heart healthy meal plan.        Personal Goal #2 Re-Evaluation   Personal Goal #2 -- Pt to identify food quantities necessary to achieve weight loss of 6-24 lb at graduation from cardiac rehab. Pt to weigh and measure serving sizes             Nutrition Goals Re-Evaluation:  Nutrition Goals Re-Evaluation    [place] Name 10/03/19 1459 10/31/19 1335           Goals   Current Weight 275 lb (124.7 kg) 273 lb (123.8 kg)      Nutrition Goal -- Pt to build a healthy plate including vegetables, fruits, whole grains, and low-fat dairy products in a heart healthy meal plan.        Personal Goal #2 Re-Evaluation   Personal Goal #2 -- Pt to identify food quantities necessary to achieve weight loss of 6-24 lb at graduation from cardiac rehab. Pt to weigh and measure serving sizes             Nutrition Goals Discharge (Final Nutrition Goals Re-Evaluation):  Nutrition Goals Re-Evaluation - 10/31/19 1335      Goals   Current Weight 273 lb (123.8 kg)    Nutrition Goal Pt to build a healthy plate including vegetables, fruits, whole grains, and low-fat dairy products in a heart healthy meal plan.      Personal Goal #2 Re-Evaluation   Personal Goal #2 Pt to identify food quantities necessary to achieve weight loss of 6-24 lb at graduation from cardiac rehab. Pt to weigh and measure serving sizes           Psychosocial: Target Goals: Acknowledge presence or absence of significant depression and/or stress, maximize  coping skills, provide positive support system. Participant is able to verbalize types and ability to use techniques and skills needed for reducing stress and depression.  Initial Review & Psychosocial Screening:  Initial Psych Review & Screening - 09/19/19 1523      Initial Review   Current issues with Current Depression;History of Depression      Family Dynamics   Good Support System? Yes    Comments Mr. 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 States Covid has not helped his feeling of isolation. He has a very supportive mother who lives in [place]. He has a brother, nephews, aunts and uncles as support. He does not drive and relies on public/private transportation. Now that he has CAD, he feels he is limited in his ability to travel out of state. He is financially  sound and hopes that once his cardiac function improves he will be able to take his nephew to [place]. He has no hobbies. He declines referral to [employer] [facility] [employer]. He declines referral for counseling. States he has declined antidepressants from PCP. PHQ9 score 9      Barriers   Psychosocial barriers to participate in program Psychosocial barriers identified (see note)      Screening Interventions   Interventions Encouraged to exercise;To provide support and resources with identified psychosocial needs;Provide feedback about the scores to participant    Expected Outcomes Short Term goal: Utilizing psychosocial counselor, staff and physician to assist with identification of specific Stressors or current issues interfering with healing process. Setting desired goal for each stressor or current issue identified.;Long Term Goal: Stressors or current issues are controlled or eliminated.;Short Term goal: Identification and review with participant of any Quality of Life or Depression concerns found by scoring the  questionnaire.;Long Term goal: The participant improves quality of Life and PHQ9 Scores as seen by post scores and/or verbalization of changes           Quality of Life Scores:  Quality of Life - 09/19/19 1558      Quality of Life   Select Quality of Life      Quality of Life Scores   Health/Function Pre 14.23 %    Socioeconomic Pre 16.94 %    Psych/Spiritual Pre 16.5 %    Family Pre 15.1 %    GLOBAL Pre 15.4 %          Scores of 19 and below usually indicate a poorer quality of life in these areas.  A difference of  2-3 points is a clinically meaningful difference.  A difference of 2-3 points in the total score of the Quality of Life Index has been associated with significant improvement in overall quality of life, self-image, physical symptoms, and general health in studies assessing change in quality of life.  PHQ-9: Recent Review Flowsheet Data    Depression screen [facility] 2/9 09/19/2019   Decreased Interest 3   Down, Depressed, Hopeless 0   PHQ - 2 Score 3   Altered sleeping 0   Tired, decreased energy 3   Change in appetite 3   Feeling bad or failure about yourself  0   Trouble concentrating 0   Moving slowly or fidgety/restless 0   Suicidal thoughts 0   PHQ-9 Score 9   Difficult doing work/chores Not difficult at all     Interpretation of Total Score  Total Score Depression Severity:  1-4 = Minimal depression, 5-9 = Mild depression, 10-14 = Moderate depression, 15-19 = Moderately severe depression, 20-27 = Severe depression   Psychosocial Evaluation and Intervention:  Psychosocial Evaluation - 09/27/19 1442      Psychosocial Evaluation & Interventions   Comments Mr. 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 States Covid has not helped his feeling of isolation. He has a very supportive mother who lives in [place]. He has a brother, nephews, aunts and  uncles as support. He does not drive and relies on public/private transportation. Now that he has CAD, he feels he is limited in his ability to travel out of state. He is financially sound and hopes that once his cardiac function improves he will be able to take his nephew to [place]. He has no hobbies. He declines referral to industries of the  blind. He declines referral for counseling. States he has declined antidepressants from PCP. PHQ9 score 9    Expected Outcomes Mr. Charlynn Court will continue to manage his depression symptoms independently as long as they do not worsen. He will consider discussing with PCP and request referral for counseling and consideration for medications. He will maintain good communication with [facility] staff related to depression symptoms.           Psychosocial Re-Evaluation:  Psychosocial Re-Evaluation    [place] Name 10/10/19 1343 10/31/19 1150           Psychosocial Re-Evaluation   Current issues with Current Depression;History of Depression Current Depression;History of Depression      Comments Mr. 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 States Covid has not helped his feeling of isolation. He has a very supportive mother who lives in [place]. He has a brother, nephews, aunts and uncles as support. He does not drive and relies on public/private transportation. Now that he has CAD, he feels he is limited in his ability to travel out of state. He is financially sound and hopes that once his cardiac function improves he will be able to take his nephew to [place]. He has no hobbies. He declines referral to [employer] the [employer]. He declines referral for counseling. States he has declined antidepressants from PCP. PHQ9 score 9 upon admission. Mr. Trapp admits to enjoying CR for socialization 3 x week. Mr. Hughlett admits to depression secondary to significant vision loss which  has impared his social life significantly since July 2017 when he became disabled. States Covid has not helped his feeling of isolation. He has a very supportive mother who lives in [place]. He has a brother, nephews, aunts and uncles as support. He does not drive and relies on public/private transportation. Now that he has CAD, he feels he is limited in his ability to travel out of state. He is financially sound and hopes that once his cardiac function improves he will be able to take his nephew to [place]. He has no hobbies. He declines referral to [employer] the [employer]. He declines referral for counseling. States he has declined antidepressants from PCP. PHQ9 score 9 upon admission. Mr. More admits to enjoying CR for socialization 3 x week.      Interventions Encouraged to attend Cardiac Rehabilitation for the exercise Encouraged to attend Cardiac Rehabilitation for the exercise      Continue Psychosocial Services  Follow up required by staff Follow up required by staff             Psychosocial Discharge (Final Psychosocial Re-Evaluation):  Psychosocial Re-Evaluation - 10/31/19 1150      Psychosocial Re-Evaluation   Current issues with Current Depression;History of Depression    Comments Mr. Brandenburg admits to 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 States Covid has not helped his feeling of isolation. He has a very supportive mother who lives in [place]. He has a brother, nephews, aunts and uncles as support. He does not drive and relies on public/private transportation. Now that he has CAD, he feels he is limited in his ability to travel out of state. He is financially sound and hopes that once his cardiac function improves he will be able to take his nephew to [place]. He has no hobbies. He declines  referral to [facility] [employer]. He declines referral for counseling. States he has declined  antidepressants from PCP. PHQ9 score 9 upon admission. Mr. Fidalgo admits to enjoying CR for socialization 3 x week.    Interventions Encouraged to attend Cardiac Rehabilitation for the exercise    Continue Psychosocial Services  Follow up required by staff           Vocational Rehabilitation: Provide vocational rehab assistance to qualifying candidates.   Vocational Rehab Evaluation & Intervention:   Education: Education Goals: Education classes will be provided on a weekly basis, covering required topics. Participant will state understanding/return demonstration of topics presented.  Learning Barriers/Preferences:  Learning Barriers/Preferences - 09/19/19 1545      Learning Barriers/Preferences   Learning Barriers Sight           Education Topics: Count Your Pulse:  -Group instruction provided by verbal instruction, demonstration, patient participation and written materials to support subject.  Instructors address importance of being able to find your pulse and how to count your pulse when at home without a heart monitor.  Patients get hands on experience counting their pulse with staff help and individually.   Heart Attack, Angina, and Risk Factor Modification:  -Group instruction provided by verbal instruction, video, and written materials to support subject.  Instructors address signs and symptoms of angina and heart attacks.    Also discuss risk factors for heart disease and how to make changes to improve heart health risk factors.   Functional Fitness:  -Group instruction provided by verbal instruction, demonstration, patient participation, and written materials to support subject.  Instructors address safety measures for doing things around the house.  Discuss how to get up and down off the floor, how to pick things up properly, how to safely get out of a chair without assistance, and balance training.   Meditation and Mindfulness:  -Group instruction provided by verbal  instruction, patient participation, and written materials to support subject.  Instructor addresses importance of mindfulness and meditation practice to help reduce stress and improve awareness.  Instructor also leads participants through a meditation exercise.    Stretching for Flexibility and Mobility:  -Group instruction provided by verbal instruction, patient participation, and written materials to support subject.  Instructors lead participants through series of stretches that are designed to increase flexibility thus improving mobility.  These stretches are additional exercise for major muscle groups that are typically performed during regular warm up and cool down.   Hands Only CPR:  -Group verbal, video, and participation provides a basic overview of AHA guidelines for community CPR. Role-play of emergencies allow participants the opportunity to practice calling for help and chest compression technique with discussion of AED use.   Hypertension: -Group verbal and written instruction that provides a basic overview of hypertension including the most recent diagnostic guidelines, risk factor reduction with self-care instructions and medication management.    Nutrition I class: Heart Healthy Eating:  -Group instruction provided by PowerPoint slides, verbal discussion, and written materials to support subject matter. The instructor gives an explanation and review of the Therapeutic Lifestyle Changes diet recommendations, which includes a discussion on lipid goals, dietary fat, sodium, fiber, plant stanol/sterol esters, sugar, and the components of a well-balanced, healthy diet.   Nutrition II class: Lifestyle Skills:  -Group instruction provided by PowerPoint slides, verbal discussion, and written materials to support subject matter. The instructor gives an explanation and review of label reading, grocery shopping for heart health, heart healthy recipe modifications, and  ways to make  healthier choices when eating out.   Diabetes Question & Answer:  -Group instruction provided by PowerPoint slides, verbal discussion, and written materials to support subject matter. The instructor gives an explanation and review of diabetes co-morbidities, pre- and post-prandial blood glucose goals, pre-exercise blood glucose goals, signs, symptoms, and treatment of hypoglycemia and hyperglycemia, and foot care basics.   Diabetes Blitz:  -Group instruction provided by PowerPoint slides, verbal discussion, and written materials to support subject matter. The instructor gives an explanation and review of the physiology behind type 1 and type 2 diabetes, diabetes medications and rational behind using different medications, pre- and post-prandial blood glucose recommendations and Hemoglobin A1c goals, diabetes diet, and exercise including blood glucose guidelines for exercising safely.    Portion Distortion:  -Group instruction provided by PowerPoint slides, verbal discussion, written materials, and food models to support subject matter. The instructor gives an explanation of serving size versus portion size, changes in portions sizes over the last 20 years, and what consists of a serving from each food group.   Stress Management:  -Group instruction provided by verbal instruction, video, and written materials to support subject matter.  Instructors review role of stress in heart disease and how to cope with stress positively.     Exercising on Your Own:  -Group instruction provided by verbal instruction, power point, and written materials to support subject.  Instructors discuss benefits of exercise, components of exercise, frequency and intensity of exercise, and end points for exercise.  Also discuss use of nitroglycerin and activating EMS.  Review options of places to exercise outside of rehab.  Review guidelines for sex with heart disease.   Cardiac Drugs I:  -Group instruction provided by  verbal instruction and written materials to support subject.  Instructor reviews cardiac drug classes: antiplatelets, anticoagulants, beta blockers, and statins.  Instructor discusses reasons, side effects, and lifestyle considerations for each drug class.   Cardiac Drugs II:  -Group instruction provided by verbal instruction and written materials to support subject.  Instructor reviews cardiac drug classes: angiotensin converting enzyme inhibitors (ACE-I), angiotensin II receptor blockers (ARBs), nitrates, and calcium channel blockers.  Instructor discusses reasons, side effects, and lifestyle considerations for each drug class.   Anatomy and Physiology of the Circulatory System:  Group verbal and written instruction and models provide basic cardiac anatomy and physiology, with the coronary electrical and arterial systems. Review of: AMI, Angina, Valve disease, Heart Failure, Peripheral Artery Disease, Cardiac Arrhythmia, Pacemakers, and the ICD.   Other Education:  -Group or individual verbal, written, or video instructions that support the educational goals of the cardiac rehab program.   Holiday Eating Survival Tips:  -Group instruction provided by PowerPoint slides, verbal discussion, and written materials to support subject matter. The instructor gives patients tips, tricks, and techniques to help them not only survive but enjoy the holidays despite the onslaught of food that accompanies the holidays.   Knowledge Questionnaire Score:  Knowledge Questionnaire Score - 09/19/19 1558      Knowledge Questionnaire Score   Pre Score 22/24           Core Components/Risk Factors/Patient Goals at Admission:  Personal Goals and Risk Factors at Admission - 09/19/19 1545      Core Components/Risk Factors/Patient Goals on Admission    Weight Management Yes;Obesity;Weight Loss    Intervention Weight Management: Develop a combined nutrition and exercise program designed to reach desired  caloric intake, while maintaining appropriate intake of nutrient and fiber, sodium  and fats, and appropriate energy expenditure required for the weight goal.;Weight Management: Provide education and appropriate resources to help participant work on and attain dietary goals.;Weight Management/Obesity: Establish reasonable short term and long term weight goals.;Obesity: Provide education and appropriate resources to help participant work on and attain dietary goals.    Admit Weight 277 lb 5.4 oz (125.8 kg)    Expected Outcomes Short Term: Continue to assess and modify interventions until short term weight is achieved;Long Term: Adherence to nutrition and physical activity/exercise program aimed toward attainment of established weight goal;Weight Loss: Understanding of general recommendations for a balanced deficit meal plan, which promotes 1-2 lb weight loss per week and includes a negative energy balance of [phone] kcal/d;Understanding recommendations for meals to include 15-35% energy as protein, 25-35% energy from fat, 35-60% energy from carbohydrates, less than 200mg  of dietary cholesterol, 20-35 gm of total fiber daily;Understanding of distribution of calorie intake throughout the day with the consumption of 4-5 meals/snacks    Lipids Yes    Intervention Provide education and support for participant on nutrition & aerobic/resistive exercise along with prescribed medications to achieve LDL 70mg , HDL >40mg .    Expected Outcomes Short Term: Participant states understanding of desired cholesterol values and is compliant with medications prescribed. Participant is following exercise prescription and nutrition guidelines.;Long Term: Cholesterol controlled with medications as prescribed, with individualized exercise RX and with personalized nutrition plan. Value goals: LDL < 70mg , HDL > 40 mg.           Core Components/Risk Factors/Patient Goals Review:   Goals and Risk Factor Review    Row Name 09/27/19  1444 10/10/19 1345 10/31/19 1150         Core Components/Risk Factors/Patient Goals Review   Personal Goals Review Weight Management/Obesity;Heart Failure;Lipids Weight Management/Obesity;Heart Failure;Lipids --     Review Mr. Sleeth has several CAD risk factors. He is eager to participate in CR for modification. His goals are to be able to walk to the mailbox and back, walk to neighbors home and take out trash with more stamina, strength, and less shortness of breath. He would like to be able to take nephew to [place] once his strength and endurance have improved. Mr. Hearne has several CAD risk factors. He is eager to participate in CR for modification. His goals are to be able to walk to the mailbox and back, walk to neighbors home and take out trash with more stamina, strength, and less shortness of breath. He would like to be able to take nephew to [place] once his strength and endurance have improved. Mr. Brozek has several CAD risk factors. He continues to be eager to participate in CR for modification. His goals are to be able to walk to the mailbox and back, walk to neighbors home and take out trash with more stamina, strength, and less shortness of breath. He would like to be able to take nephew to [place] once his strength and endurance have improved.     Expected Outcomes Mr. Gomillion will continue to participate in CR for risk factor modification and for progression towards personal goals. Mr. Goeller will continue to participate in CR for risk factor modification and for progression towards personal goals. Mr. Mathieu will continue to participate in CR for risk factor modification and for progression towards personal goals.            Core Components/Risk Factors/Patient Goals at Discharge (Final Review):   Goals and Risk Factor Review - 10/31/19 1150  Core Components/Risk Factors/Patient Goals Review   Review Mr. Mcglinn has several CAD risk factors. He continues to be eager to participate in CR  for modification. His goals are to be able to walk to the mailbox and back, walk to neighbors home and take out trash with more stamina, strength, and less shortness of breath. He would like to be able to take nephew to [place] once his strength and endurance have improved.    Expected Outcomes Mr. Pouliot will continue to participate in CR for risk factor modification and for progression towards personal goals.           ITP Comments:  ITP Comments    Row Name 09/19/19 1341 09/27/19 1437 10/10/19 1338 10/31/19 1144     ITP Comments Dr. Fransico Him Medical Director Cardiac [facility] Mr. Ishaq completed his first cardiac rehab exercise today and tolerated well. Worked 30 min on NuStep at an RPE of 11. VSS. Patient did complain of left thigh and calf "burning" 4/10 which subsided post exercise during stretches and cool down. 30 day ITP review: Mr. Novosel has completed 6 cardiac rehab exercise sessions since admission. He has had perfect attendance and continues to be eager to participate in CR. He consistantly works to an RPE of 11-13 with average mets on the NuStep 1.9. He does complain of intermittant calf pain. He is restricted to seated exercise secondary to very poor vision and requires an assistive device for ambulation safety. VSS and within parameters for exercise. Too soon to evaluate progression towards goals of increasing stamina and strength. Will re-evaluate in 30 days. 30 day ITP review: Mr. Lack has completed 10 cardiac rehab exercise sessions since admission. He has had a few absences from CR secondary to diarrhea and was sent to the ED on his 9th session for asymptomatic bradycardia and hypotension (Dx hypovolemia secondary to diarrhea). He returned 10/30/19 and tolerated exercise well with BP and HR WNL. Egan feels his stamina and strength are slowly improving. Will re-evaluate in 30 days.           Comments: see ITP comments

## 2019-11-01 ENCOUNTER — Other Ambulatory Visit: Payer: Self-pay

## 2019-11-01 ENCOUNTER — Encounter (HOSPITAL_COMMUNITY)
Admission: RE | Admit: 2019-11-01 | Discharge: 2019-11-01 | Disposition: A | Discharge status: Home / Self Care | Payer: Medicare HMO | Source: Ambulatory Visit | Attending: Cardiology | Admitting: Cardiology

## 2019-11-01 DIAGNOSIS — Z955 Presence of coronary angioplasty implant and graft: Secondary | ICD-10-CM

## 2019-11-01 DIAGNOSIS — I2109 ST elevation (STEMI) myocardial infarction involving other coronary artery of anterior wall: Secondary | ICD-10-CM | POA: Diagnosis not present

## 2019-11-02 ENCOUNTER — Ambulatory Visit: Payer: Medicare HMO | Admitting: Cardiology

## 2019-11-02 ENCOUNTER — Other Ambulatory Visit (HOSPITAL_COMMUNITY): Payer: Self-pay | Admitting: Cardiology

## 2019-11-02 ENCOUNTER — Encounter: Payer: Self-pay | Admitting: Cardiology

## 2019-11-02 VITALS — BP 120/72 | HR 57 | Resp 16 | Ht 71.0 in | Wt 274.0 lb

## 2019-11-02 DIAGNOSIS — I5020 Unspecified systolic (congestive) heart failure: Secondary | ICD-10-CM

## 2019-11-02 DIAGNOSIS — I2510 Atherosclerotic heart disease of native coronary artery without angina pectoris: Secondary | ICD-10-CM

## 2019-11-02 NOTE — Progress Notes (Signed)
Patient referred by Lindie Spruce, MD for CAD  Subjective:   Jonathon Cain, male    DOB: 06/26/1960, 59 y.o.   MRN: [id number]   Chief Complaint  Patient presents with  . Coronary Artery Disease  . HFrEF  . Follow-up  . Results    echo     HPI  59 y.o. 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  Patient is doing well. He has not had any recurrent lightheadedness episodes. Also denies chest pain with his level of activity. Recent echocardiogram reviewed with the patient.   He is enrolled in EMPACT-MI clinical trial.    Current Outpatient Medications on File Prior to Visit  Medication Sig Dispense Refill  . acetaminophen (TYLENOL) 325 MG tablet Take 2 tablets (650 mg total) by mouth every 4 (four) hours as needed for headache or mild pain.    [name] Kitchen aspirin 81 MG chewable tablet Chew 1 tablet (81 mg total) by mouth daily. 30 tablet 11  . atorvastatin (LIPITOR) 80 MG tablet Take 1 tablet (80 mg total) by mouth daily. 30 tablet 3  . cetirizine (ZYRTEC) 10 MG tablet Take 10 mg by mouth daily as needed for allergies.    . fluticasone (FLONASE) 50 MCG/ACT nasal spray Place 2 sprays into both nostrils daily as needed for allergies or rhinitis.     . furosemide (LASIX) 20 MG tablet Take 1 tablet (20 mg total) by mouth daily. 30 tablet 1  . latanoprost (XALATAN) 0.005 % ophthalmic solution Place 1 drop into both eyes See admin instructions. Place 1 drop into each eye at bedtime for 2 months on/2 months off- alternate with Timolol (Patient not taking: Reported on 09/20/2019)    . metoprolol succinate (TOPROL XL) 25 MG 24 hr tablet Take 0.5 tablets (12.5 mg total) by mouth daily. 30 tablet 2  . nitroGLYCERIN (NITROSTAT) 0.4 MG SL tablet Place 1 tablet (0.4 mg total) under the tongue every 5 (five) minutes as needed for chest  pain. 25 tablet 2  . pantoprazole (PROTONIX) 40 MG tablet TAKE 1 TABLET(40 MG) BY MOUTH DAILY 30 tablet 2  . sacubitril-valsartan (ENTRESTO) 24-26 MG Take 1 tablet by mouth 2 (two) times daily. 60 tablet 1  . spironolactone (ALDACTONE) 25 MG tablet Take 1 tablet (25 mg total) by mouth daily. 90 tablet 3  . ticagrelor (BRILINTA) 90 MG TABS tablet Take 1 tablet (90 mg total) by mouth 2 (two) times daily. 60 tablet 11  . timolol (TIMOPTIC) 0.5 % ophthalmic solution Place 1 drop into both eyes See admin instructions. Place 1 drop into both eyes two times a day for 2 months on/2 months off- alternate with Latanoprost (Patient not taking: Reported on 09/20/2019)     No current facility-administered medications on file prior to visit.    Cardiovascular and other pertinent studies:  EKG 08/02/2019: Sinus rhythm 52 bpm. Extensive anterolateral infarct, IVCD.  Echocardiogram 10/24/2019:  Left ventricle cavity is mildly dilated. Distal anteroseptal and apical  akinesis. LV endocardial border not well visualized to accurately asses  EF, probably around 30%. Recommend alternate imaging modality to  accurately assess EF.  Doppler evidence of grade III (restrictive)  diastolic dysfunction.  Left atrial cavity is severely dilated.  Moderate (Grade III) mitral regurgitation.  Inadequate TR jet to estimate PASP. Estimated RA pressure 8 mmHg.  No significant change compared to previous study on  07/27/2019.   Left Heart Catheterization 07/26/19 (Dr. Einar Gip):  LM: Mild diffuse disease LAD: Very large caliber vessel, occluded at the proximal end.  Gives origin to high D1 which is large.  Successful PTCA and stenting of the proximal LAD overlapping D1, 3.5 x 24 mm Synergy DES deployed at 12 atmospheric pressure for 60 S. 100% to 0% with TIMI 0 to TIMI-3 flow. LCx: Large vessel, giving origin to a very large OM1 which is secondary branches.  Ostium of OM1 has 80% stenosis. RCA: Mild diffuse disease, distal RCA 15  to 20% stenosis, PDA 50% stenosis.  Recommendation: Patient will need aggressive risk factor modification, dual antiplatelet therapy with aspirin and Brilinta for a year, will be watched carefully in the intensive care unit for preshock, suspect his EDP is extremely high at the end of the procedure with excessive contrast use.  Will monitor for any contrast nephropathy as well.  Recent labs: 08/17/2019: Glucose 90, BUN/Cr 17/1.21. EGFR 65. Na/K 136/4.9.  H/H 14/44. MCV 90. Platelets 289  07/26/2019: Glucose 122, BUN/Cr 9/0.9. EGFR 60. H/H 16.1/49.0. MCV 90.2. Platelets 240 HbA1C 5.5% Chol 230, TG 95, HDL 36, LDL 175   Review of Systems  Cardiovascular: Positive for dyspnea on exertion and orthopnea. Negative for chest pain, leg swelling, palpitations and syncope.  Gastrointestinal: Positive for diarrhea and melena.         Vitals:   11/02/19 1214  BP: 120/72  Pulse: (!) 57  Resp: 16  SpO2: 97%     Body mass index is 38.22 kg/m. Filed Weights   11/02/19 1214  Weight: 274 lb (124.3 kg)     Objective:   Physical Exam Vitals and nursing note reviewed.  Constitutional:      General: He is not in acute distress. Neck:     Vascular: No JVD.  Cardiovascular:     Rate and Rhythm: Normal rate and regular rhythm.     Pulses: Intact distal pulses.     Heart sounds: Normal heart sounds. No murmur heard.   Pulmonary:     Effort: Pulmonary effort is normal.     Breath sounds: Normal breath sounds. No wheezing or rales.          Assessment & Recommendations:   59 y.o. 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  CAD: S/p primary PCI to prox LAD. Residual non-culprit 80% stenosis in large OM1. Although he does not have any angina symptoms at this time, his EF remains low. In order to achieve complete revascularization, recommend  repeat coronary angiography, PCI to OM1 with IVUS, and look at any other residual severe lesions.  Continue Aspirin/Brilinta till 07/2020. No melena. Continue atorvastatin 80 mg daily.  Schedule for cardiac catheterization, and elective intervention. We discussed regarding risks, benefits, alternatives to this including stress testing, CTA and continued medical therapy. Patient wants to proceed. Understands <1-2% risk of death, stroke, MI, urgent CABG, bleeding, infection, renal failure but not limited to these.   Ischemic cardiomyopathy: EF probably around 30%, although accurate estimation limited due to poor visualization of endocardial borders.  Currently on metoprolol succinate 12.5 mg daily, Entesto 24-26 mg bid, spironolactone 25 mg daily. Lasix as needed.  Recommend liberal hydration. Up titration limited due to low normal blood pressures. Will obtain MRI 3-4 weeks after complete revascularization. If EF remains <35%, will refer to EP for consideration for ICD for primary prevention.  Continue LifeVest for now.   F/u after cath  Nigel Mormon, MD [facility] [place] Pager: [phone] Office: [phone]

## 2019-11-03 ENCOUNTER — Encounter (HOSPITAL_COMMUNITY): Payer: Medicare HMO

## 2019-11-03 LAB — BASIC METABOLIC PANEL
BUN/Creatinine Ratio: 13 (ref 10–24)
BUN: 15 mg/dL (ref 8–27)
CO2: 22 mmol/L (ref 20–29)
Calcium: 8.5 mg/dL — ABNORMAL LOW (ref 8.6–10.2)
Chloride: 102 mmol/L (ref 96–106)
Creatinine, Ser: 1.18 mg/dL (ref 0.76–1.27)
GFR calc Af Amer: 77 mL/min/{1.73_m2} (ref >59)
GFR calc non Af Amer: 66 mL/min/{1.73_m2} (ref >59)
Glucose: 85 mg/dL (ref 65–99)
Potassium: 4.7 mmol/L (ref 3.5–5.2)
Sodium: 138 mmol/L (ref 134–144)

## 2019-11-06 ENCOUNTER — Encounter (HOSPITAL_COMMUNITY): Payer: Medicare HMO

## 2019-11-08 ENCOUNTER — Telehealth: Payer: Self-pay

## 2019-11-08 ENCOUNTER — Encounter (HOSPITAL_COMMUNITY): Payer: Medicare HMO

## 2019-11-08 ENCOUNTER — Telehealth (HOSPITAL_COMMUNITY): Payer: Self-pay

## 2019-11-08 ENCOUNTER — Encounter (HOSPITAL_COMMUNITY): Payer: Self-pay

## 2019-11-08 ENCOUNTER — Other Ambulatory Visit: Payer: Self-pay

## 2019-11-08 MED ORDER — ENTRESTO 24-26 MG PO TABS: 1.0000 | ORAL_TABLET | Freq: Two times a day (BID) | ORAL | 1 refills | Status: DC

## 2019-11-08 NOTE — Progress Notes (Signed)
Discharge Progress Report  Patient Details  Name: Jonathon Cain MRN: [id number] Date of Birth: 1960/09/07 Referring Provider:     CARDIAC REHAB PHASE II ORIENTATION from 09/19/2019 in [place]  Referring Provider Vernell Leep       Number of Visits: 10  Reason for Discharge:  Early Exit:  Patient to have a scheduled PCI 11/14/19  Smoking History:  Social History   Tobacco Use  Smoking Status Former Smoker   Packs/day: 1.00   Years: 10.00   Pack years: 10.00   Types: Cigarettes   Quit date: 07/26/2019   Years since quitting: 0.3  Smokeless Tobacco Never Used    Diagnosis:  No diagnosis found.  ADL UCSD:   Initial Exercise Prescription:  Initial Exercise Prescription - 09/19/19 1500      Date of Initial Exercise RX and Referring Provider   Date 09/19/19    Referring Provider Vernell Leep    Expected Discharge Date 11/17/19      NuStep   Level 1    SPM 60    Minutes 30    METs 1.7      Prescription Details   Frequency (times per week) 3    Duration Progress to 30 minutes of continuous aerobic without signs/symptoms of physical distress      Intensity   THRR 40-80% of Max Heartrate 57-114    Ratings of Perceived Exertion 11-13    Perceived Dyspnea 0-4      Progression   Progression Continue progressive overload as per policy without signs/symptoms or physical distress.      Resistance Training   Training Prescription Yes    Weight 3    Reps 10-15           Discharge Exercise Prescription (Final Exercise Prescription Changes):  Exercise Prescription Changes - 11/01/19 1600      Response to Exercise   Blood Pressure (Admit) 120/70    Blood Pressure (Exercise) 108/70    Blood Pressure (Exit) 102/64    Heart Rate (Admit) 79 bpm    Heart Rate (Exercise) 83 bpm    Heart Rate (Exit) 63 bpm    Rating of Perceived Exertion (Exercise) 11    Symptoms None    Comments Reviewed METS and Goals     Duration Progress to 30 minutes of  aerobic without signs/symptoms of physical distress    Intensity THRR unchanged      Progression   Progression Continue to progress workloads to maintain intensity without signs/symptoms of physical distress.      Resistance Training   Training Prescription No    Weight --   Uses 3 lb wts on M, F     Interval Training   Interval Training No      NuStep   Level 2    SPM 75    Minutes 30    METs 2      Home Exercise Plan   Plans to continue exercise at Home (comment)    Frequency Add 2 additional days to program exercise sessions.    Initial Home Exercises Provided 10/16/19           Functional Capacity:  6 Minute Walk    Row Name 09/19/19 1438         6 Minute Walk   Phase Initial     Distance 900 feet     Walk Time 6 minutes     # of Rest Breaks 1  MPH 1.7     METS 1.79     RPE 9     Perceived Dyspnea  1     VO2 Peak 6.29     Symptoms Yes (comment)     Comments Bilateral leg pain (4 on 0-10 scale). SOB, RPD = 1     Resting HR 56 bpm     Resting BP 94/60     Resting Oxygen Saturation  97 %     Exercise Oxygen Saturation  during 6 min walk 97 %     Max Ex. HR 92 bpm     Max Ex. BP 108/63     2 Minute Post BP 115/73            Psychological, QOL, Others - Outcomes: PHQ 2/9: Depression screen PHQ 2/9 09/19/2019  Decreased Interest 3  Down, Depressed, Hopeless 0  PHQ - 2 Score 3  Altered sleeping 0  Tired, decreased energy 3  Change in appetite 3  Feeling bad or failure about yourself  0  Trouble concentrating 0  Moving slowly or fidgety/restless 0  Suicidal thoughts 0  PHQ-9 Score 9  Difficult doing work/chores Not difficult at all    Quality of Life:  Quality of Life - 09/19/19 1558      Quality of Life   Select Quality of Life      Quality of Life Scores   Health/Function Pre 14.23 %    Socioeconomic Pre 16.94 %    Psych/Spiritual Pre 16.5 %    Family Pre 15.1 %    GLOBAL Pre 15.4 %            Personal Goals: Goals established at orientation with interventions provided to work toward goal.  Personal Goals and Risk Factors at Admission - 09/19/19 1545      Core Components/Risk Factors/Patient Goals on Admission    Weight Management Yes;Obesity;Weight Loss    Intervention Weight Management: Develop a combined nutrition and exercise program designed to reach desired caloric intake, while maintaining appropriate intake of nutrient and fiber, sodium and fats, and appropriate energy expenditure required for the weight goal.;Weight Management: Provide education and appropriate resources to help participant work on and attain dietary goals.;Weight Management/Obesity: Establish reasonable short term and long term weight goals.;Obesity: Provide education and appropriate resources to help participant work on and attain dietary goals.    Admit Weight 277 lb 5.4 oz (125.8 kg)    Expected Outcomes Short Term: Continue to assess and modify interventions until short term weight is achieved;Long Term: Adherence to nutrition and physical activity/exercise program aimed toward attainment of established weight goal;Weight Loss: Understanding of general recommendations for a balanced deficit meal plan, which promotes 1-2 lb weight loss per week and includes a negative energy balance of [id number] kcal/d;Understanding recommendations for meals to include 15-35% energy as protein, 25-35% energy from fat, 35-60% energy from carbohydrates, less than 200mg  of dietary cholesterol, 20-35 gm of total fiber daily;Understanding of distribution of calorie intake throughout the day with the consumption of 4-5 meals/snacks    Lipids Yes    Intervention Provide education and support for participant on nutrition & aerobic/resistive exercise along with prescribed medications to achieve LDL 70mg , HDL >40mg .    Expected Outcomes Short Term: Participant states understanding of desired cholesterol values and is compliant with  medications prescribed. Participant is following exercise prescription and nutrition guidelines.;Long Term: Cholesterol controlled with medications as prescribed, with individualized exercise RX and with personalized nutrition plan. Value goals: LDL <  70mg , HDL > 40 mg.            Personal Goals Discharge:  Goals and Risk Factor Review    Row Name 09/27/19 1444 10/10/19 1345 10/31/19 1150 11/14/19 1354       Core Components/Risk Factors/Patient Goals Review   Personal Goals Review Weight Management/Obesity;Heart Failure;Lipids Weight Management/Obesity;Heart Failure;Lipids -- Weight Management/Obesity;Heart Failure;Lipids    Review Mr. Riling has several CAD risk factors. He is eager to participate in CR for modification. His goals are to be able to walk to the mailbox and back, walk to neighbors home and take out trash with more stamina, strength, and less shortness of breath. He would like to be able to take nephew to [place] once his strength and endurance have improved. Mr. Mcdanel has several CAD risk factors. He is eager to participate in CR for modification. His goals are to be able to walk to the mailbox and back, walk to neighbors home and take out trash with more stamina, strength, and less shortness of breath. He would like to be able to take nephew to [place] once his strength and endurance have improved. Mr. Geisen has several CAD risk factors. He continues to be eager to participate in CR for modification. His goals are to be able to walk to the mailbox and back, walk to neighbors home and take out trash with more stamina, strength, and less shortness of breath. He would like to be able to take nephew to [place] once his strength and endurance have improved. Benz continues to have multiple CAD risk factors with need for additional interventions including PCI 11/14/19. He will be readmitted to CR post PCI for risk factor modifications    Expected Outcomes Mr. Mcentee will continue to participate in CR  for risk factor modification and for progression towards personal goals. Mr. Purohit will continue to participate in CR for risk factor modification and for progression towards personal goals. Mr. Lowe will continue to participate in CR for risk factor modification and for progression towards personal goals. Mr. Kozma will continue to participate in CR for risk factor modification and for progression towards personal goals once readmitted post PCI 11/14/19           Exercise Goals and Review:  Exercise Goals    Row Name 09/19/19 1537             Exercise Goals   Increase Physical Activity Yes       Expected Outcomes Short Term: Attend rehab on a regular basis to increase amount of physical activity.;Long Term: Add in home exercise to make exercise part of routine and to increase amount of physical activity.;Long Term: Exercising regularly at least 3-5 days a week.       Increase Strength and Stamina Yes       Intervention Provide advice, education, support and counseling about physical activity/exercise needs.;Develop an individualized exercise prescription for aerobic and resistive training based on initial evaluation findings, risk stratification, comorbidities and participant's personal goals.       Expected Outcomes Short Term: Increase workloads from initial exercise prescription for resistance, speed, and METs.;Short Term: Perform resistance training exercises routinely during rehab and add in resistance training at home;Long Term: Improve cardiorespiratory fitness, muscular endurance and strength as measured by increased METs and functional capacity (6MWT)       Able to understand and use rate of perceived exertion (RPE) scale Yes       Intervention Provide education and explanation on how to  use RPE scale       Expected Outcomes Short Term: Able to use RPE daily in rehab to express subjective intensity level;Long Term:  Able to use RPE to guide intensity level when exercising  independently       Knowledge and understanding of Target Heart Rate Range (THRR) Yes       Expected Outcomes Short Term: Able to use daily as guideline for intensity in rehab;Long Term: Able to use THRR to govern intensity when exercising independently;Short Term: Able to state/look up THRR       Able to check pulse independently Yes       Intervention Provide education and demonstration on how to check pulse in carotid and radial arteries.;Review the importance of being able to check your own pulse for safety during independent exercise       Expected Outcomes Short Term: Able to explain why pulse checking is important during independent exercise;Long Term: Able to check pulse independently and accurately       Understanding of Exercise Prescription Yes       Intervention Provide education, explanation, and written materials on patient's individual exercise prescription       Expected Outcomes Short Term: Able to explain program exercise prescription;Long Term: Able to explain home exercise prescription to exercise independently              Exercise Goals Re-Evaluation:  Exercise Goals Re-Evaluation    Row Name 09/27/19 1500 10/16/19 1415 11/01/19 1619         Exercise Goal Re-Evaluation   Exercise Goals Review Increase Physical Activity;Increase Strength and Stamina;Able to understand and use rate of perceived exertion (RPE) scale;Knowledge and understanding of Target Heart Rate Range (THRR);Able to check pulse independently;Understanding of Exercise Prescription Increase Physical Activity;Increase Strength and Stamina;Able to understand and use rate of perceived exertion (RPE) scale;Knowledge and understanding of Target Heart Rate Range (THRR);Able to check pulse independently;Understanding of Exercise Prescription Increase Physical Activity;Increase Strength and Stamina;Able to understand and use rate of perceived exertion (RPE) scale;Knowledge and understanding of Target Heart Rate Range  (THRR);Able to check pulse independently;Understanding of Exercise Prescription     Comments Pt's first day of CRP2 program. PT tolerated the session well with no complaints. Pt understands Ex Rx, THRR, and RPE scale Reviwed home exercise Rx with patient. Due to patients vision loss he walks on the ramp to his deck and holds the handle for safety. He usually does this for about 10 minutes.We discusssed doing it more than once a day and he may try to do that.H e also has a Wii and will does the sking or boxing for exercise. Due to his visual limitations this could be an adjuct to the walking. Reviwed METs and goals. Pt continues to try and walk but is limited by the hot, humid weather. Voices walking in his driveway. Has not used Wii at home,. Will continue to encourage any amount of additional physical activity.     Expected Outcomes Will continue to monitor and progress patient as tolerated. Pt will exercise at home 2x/wk by walking the ramp on his deck or using his Wii for a total of 30 minutes of exercise. Will exercise where he a cumulative total of 30 minutes 2 x/wk.            Nutrition & Weight - Outcomes:  Pre Biometrics - 09/19/19 1415      Pre Biometrics   Waist Circumference 52.5 inches    Hip Circumference 47.5 inches  Waist to Hip Ratio 1.11 %    Triceps Skinfold 33 mm    % Body Fat 40.2 %    Grip Strength 47 kg    Flexibility --   Not done to h/o low back surgery for Spinabifita   Single Leg Stand --   Due to pt's status of being legally blind, limited perephial vision. Test not done           Nutrition:  Nutrition Therapy & Goals - 10/03/19 1459      Nutrition Therapy   Diet Heart healthy      Personal Nutrition Goals   Nutrition Goal Pt to build a healthy plate including vegetables, fruits, whole grains, and low-fat dairy products in a heart healthy meal plan.    Personal Goal #2 Pt to identify food quantities necessary to achieve weight loss of 6-24 lb at  graduation from cardiac rehab. Pt to weigh and measure serving sizes      Intervention Plan   Intervention Prescribe, educate and counsel regarding individualized specific dietary modifications aiming towards targeted core components such as weight, hypertension, lipid management, diabetes, heart failure and other comorbidities.    Expected Outcomes Short Term Goal: Understand basic principles of dietary content, such as calories, fat, sodium, cholesterol and nutrients.           Nutrition Discharge:   Education Questionnaire Score:  Knowledge Questionnaire Score - 09/19/19 1558      Knowledge Questionnaire Score   Pre Score 22/24

## 2019-11-08 NOTE — Telephone Encounter (Signed)
Cardiac Rehab Note:  Successful telephone call to follow up on recent absent from CR. Patient states he has had "allergy" symptoms but feeling better. Patient also completed follow up appointment with Dr. Virgina Jock 11/02/19. Per patient and EMR review patient is scheduled for PCI intervention 11/14/19. He is scheduled to graduate from cardiac rehab exercise sessions 11/17/19. It is agreed that patient will be discharged from cardiac rehab today secondary to need for cardiac PCI. He will be eligible to return to cardiac rehab post procedure once cleared by cardiology for an additional 8 weeks. Patient verbalizes understanding.  Shandy Vi E. Rollene Rotunda RN, BSN [place]. [facility]  Cardiac and Pulmonary Rehabilitation Phone: [phone] Fax: [phone]

## 2019-11-08 NOTE — Telephone Encounter (Signed)
Spoke with patient. Sent in refill for Entreso to pharmacy on file.

## 2019-11-10 ENCOUNTER — Other Ambulatory Visit (HOSPITAL_COMMUNITY)
Admission: RE | Admit: 2019-11-10 | Discharge: 2019-11-10 | Disposition: A | Discharge status: Home / Self Care | Payer: Medicare HMO | Source: Ambulatory Visit | Attending: Cardiology | Admitting: Cardiology

## 2019-11-10 ENCOUNTER — Encounter (HOSPITAL_COMMUNITY): Payer: Medicare HMO

## 2019-11-10 DIAGNOSIS — Z01812 Encounter for preprocedural laboratory examination: Secondary | ICD-10-CM | POA: Diagnosis present

## 2019-11-10 DIAGNOSIS — Z20822 Contact with and (suspected) exposure to covid-19: Secondary | ICD-10-CM | POA: Diagnosis not present

## 2019-11-10 LAB — SARS CORONAVIRUS 2 (TAT 6-24 HRS): SARS Coronavirus 2: NEGATIVE

## 2019-11-13 ENCOUNTER — Encounter (HOSPITAL_COMMUNITY): Payer: Medicare HMO

## 2019-11-14 ENCOUNTER — Other Ambulatory Visit: Payer: Self-pay

## 2019-11-14 ENCOUNTER — Encounter (HOSPITAL_COMMUNITY)
Admission: RE | Disposition: A | Discharge status: Home / Self Care | Payer: Self-pay | Source: Home / Self Care | Attending: Cardiology

## 2019-11-14 ENCOUNTER — Ambulatory Visit (HOSPITAL_COMMUNITY)
Admission: RE | Admit: 2019-11-14 | Discharge: 2019-11-14 | Disposition: A | Discharge status: Home / Self Care | Payer: Medicare HMO | Attending: Cardiology | Admitting: Cardiology

## 2019-11-14 DIAGNOSIS — Z6837 Body mass index (BMI) 37.0-37.9, adult: Secondary | ICD-10-CM | POA: Insufficient documentation

## 2019-11-14 DIAGNOSIS — I25118 Atherosclerotic heart disease of native coronary artery with other forms of angina pectoris: Secondary | ICD-10-CM | POA: Diagnosis present

## 2019-11-14 DIAGNOSIS — I255 Ischemic cardiomyopathy: Secondary | ICD-10-CM | POA: Diagnosis not present

## 2019-11-14 DIAGNOSIS — Z79899 Other long term (current) drug therapy: Secondary | ICD-10-CM | POA: Diagnosis not present

## 2019-11-14 DIAGNOSIS — F17200 Nicotine dependence, unspecified, uncomplicated: Secondary | ICD-10-CM | POA: Diagnosis not present

## 2019-11-14 DIAGNOSIS — H3552 Pigmentary retinal dystrophy: Secondary | ICD-10-CM | POA: Insufficient documentation

## 2019-11-14 DIAGNOSIS — H548 Legal blindness, as defined in USA: Secondary | ICD-10-CM | POA: Diagnosis not present

## 2019-11-14 DIAGNOSIS — I5020 Unspecified systolic (congestive) heart failure: Secondary | ICD-10-CM | POA: Diagnosis present

## 2019-11-14 DIAGNOSIS — Z7982 Long term (current) use of aspirin: Secondary | ICD-10-CM | POA: Diagnosis not present

## 2019-11-14 DIAGNOSIS — I252 Old myocardial infarction: Secondary | ICD-10-CM | POA: Insufficient documentation

## 2019-11-14 DIAGNOSIS — I2510 Atherosclerotic heart disease of native coronary artery without angina pectoris: Secondary | ICD-10-CM | POA: Diagnosis present

## 2019-11-14 DIAGNOSIS — Z9861 Coronary angioplasty status: Secondary | ICD-10-CM

## 2019-11-14 DIAGNOSIS — E785 Hyperlipidemia, unspecified: Secondary | ICD-10-CM | POA: Insufficient documentation

## 2019-11-14 HISTORY — PX: INTRAVASCULAR ULTRASOUND/IVUS: CATH118244

## 2019-11-14 HISTORY — PX: CORONARY BALLOON ANGIOPLASTY: CATH118233

## 2019-11-14 HISTORY — PX: CORONARY ULTRASOUND/IVUS: CATH118244

## 2019-11-14 HISTORY — PX: INTRAVASCULAR PRESSURE WIRE/FFR STUDY: CATH118243

## 2019-11-14 LAB — POCT ACTIVATED CLOTTING TIME
Activated Clotting Time: 709 seconds
Activated Clotting Time: 439 seconds

## 2019-11-14 SURGERY — INTRAVASCULAR ULTRASOUND/IVUS
Anesthesia: LOCAL

## 2019-11-14 MED ORDER — HEPARIN (PORCINE) IN NACL 1000-0.9 UT/500ML-% IV SOLN
INTRAVENOUS | Status: DC | PRN
  Administered 2019-11-14 (×2): 500 mL

## 2019-11-14 MED ORDER — NITROGLYCERIN 1 MG/10 ML FOR IR/CATH LAB
INTRA_ARTERIAL | Status: DC | PRN
  Administered 2019-11-14 (×2): 50 ug via INTRACORONARY
  Administered 2019-11-14: 100 ug via INTRACORONARY

## 2019-11-14 MED ORDER — SODIUM CHLORIDE 0.9% FLUSH: 3.0000 mL | Freq: Two times a day (BID) | INTRAVENOUS | Status: DC

## 2019-11-14 MED ORDER — HEPARIN SODIUM (PORCINE) 1000 UNIT/ML IJ SOLN
INTRAMUSCULAR | Status: AC
  Filled 2019-11-14: qty 1

## 2019-11-14 MED ORDER — SODIUM CHLORIDE 0.9 % IV SOLN
INTRAVENOUS | Status: DC
  Administered 2019-11-14: 250 mL via INTRAVENOUS

## 2019-11-14 MED ORDER — VERAPAMIL HCL 2.5 MG/ML IV SOLN
INTRAVENOUS | Status: DC | PRN
  Administered 2019-11-14: 5 mL via INTRA_ARTERIAL

## 2019-11-14 MED ORDER — LIDOCAINE HCL (PF) 1 % IJ SOLN
INTRAMUSCULAR | Status: DC | PRN
  Administered 2019-11-14: 5 mL

## 2019-11-14 MED ORDER — NITROGLYCERIN 1 MG/10 ML FOR IR/CATH LAB
INTRA_ARTERIAL | Status: AC
  Filled 2019-11-14: qty 10

## 2019-11-14 MED ORDER — SODIUM CHLORIDE 0.9 % IV SOLN
250.0000 mL | INTRAVENOUS | Status: DC | PRN
  Administered 2019-11-14: 250 mL via INTRAVENOUS

## 2019-11-14 MED ORDER — IOHEXOL 350 MG/ML SOLN
INTRAVENOUS | Status: DC | PRN
  Administered 2019-11-14: 70 mL

## 2019-11-14 MED ORDER — ONDANSETRON HCL 4 MG/2ML IJ SOLN: 4.0000 mg | Freq: Four times a day (QID) | INTRAMUSCULAR | Status: DC | PRN

## 2019-11-14 MED ORDER — LIDOCAINE HCL (PF) 1 % IJ SOLN
INTRAMUSCULAR | Status: AC
  Filled 2019-11-14: qty 30

## 2019-11-14 MED ORDER — SODIUM CHLORIDE 0.9 % IV SOLN: 250.0000 mL | INTRAVENOUS | Status: DC | PRN

## 2019-11-14 MED ORDER — FENTANYL CITRATE (PF) 100 MCG/2ML IJ SOLN
INTRAMUSCULAR | Status: AC
  Filled 2019-11-14: qty 2

## 2019-11-14 MED ORDER — ACETAMINOPHEN 325 MG PO TABS: 650.0000 mg | ORAL_TABLET | ORAL | Status: DC | PRN

## 2019-11-14 MED ORDER — FENTANYL CITRATE (PF) 100 MCG/2ML IJ SOLN
INTRAMUSCULAR | Status: DC | PRN
Start: 2019-11-14 — End: 2019-11-14
  Administered 2019-11-14: 12.5 ug via INTRAVENOUS

## 2019-11-14 MED ORDER — MIDAZOLAM HCL 2 MG/2ML IJ SOLN
INTRAMUSCULAR | Status: AC
  Filled 2019-11-14: qty 2

## 2019-11-14 MED ORDER — SODIUM CHLORIDE 0.9% FLUSH: 3.0000 mL | INTRAVENOUS | Status: DC | PRN

## 2019-11-14 MED ORDER — HEPARIN SODIUM (PORCINE) 1000 UNIT/ML IJ SOLN
INTRAMUSCULAR | Status: DC | PRN
  Administered 2019-11-14: 9000 [IU] via INTRAVENOUS

## 2019-11-14 MED ORDER — HEPARIN (PORCINE) IN NACL 1000-0.9 UT/500ML-% IV SOLN
INTRAVENOUS | Status: AC
  Filled 2019-11-14: qty 1000

## 2019-11-14 MED ORDER — LABETALOL HCL 5 MG/ML IV SOLN: 10.0000 mg | INTRAVENOUS | Status: DC | PRN

## 2019-11-14 MED ORDER — IOHEXOL 350 MG/ML SOLN
INTRAVENOUS | Status: AC
  Filled 2019-11-14: qty 1

## 2019-11-14 MED ORDER — SODIUM CHLORIDE 0.9 % IV SOLN: INTRAVENOUS | Status: AC

## 2019-11-14 MED ORDER — ASPIRIN 81 MG PO CHEW: 81.0000 mg | CHEWABLE_TABLET | ORAL | Status: DC

## 2019-11-14 MED ORDER — VERAPAMIL HCL 2.5 MG/ML IV SOLN
INTRAVENOUS | Status: AC
  Filled 2019-11-14: qty 2

## 2019-11-14 MED ORDER — HYDRALAZINE HCL 20 MG/ML IJ SOLN: 10.0000 mg | INTRAMUSCULAR | Status: DC | PRN

## 2019-11-14 SURGICAL SUPPLY — 17 items
BALLN SAPPHIRE ~~LOC~~ 3.5X15 (BALLOONS) ×1 IMPLANT
CATH LAUNCHER 6FR JL3.5 (CATHETERS) ×1 IMPLANT
CATH OPTICROSS HD (CATHETERS) ×1 IMPLANT
DEVICE RAD COMP TR BAND LRG (VASCULAR PRODUCTS) ×1 IMPLANT
ELECT DEFIB PAD ADLT CADENCE (PAD) ×1 IMPLANT
GLIDESHEATH SLEND A-KIT 6F 22G (SHEATH) ×1 IMPLANT
GUIDEWIRE INQWIRE 1.5J.035X260 (WIRE) IMPLANT
GUIDEWIRE PRESSURE COMET II (WIRE) ×1 IMPLANT
INQWIRE 1.5J .035X260CM (WIRE) ×2
KIT ENCORE 26 ADVANTAGE (KITS) ×1 IMPLANT
KIT HEART LEFT (KITS) ×2 IMPLANT
KIT HEMO VALVE WATCHDOG (MISCELLANEOUS) ×1 IMPLANT
PACK CARDIAC CATHETERIZATION (CUSTOM PROCEDURE TRAY) ×2 IMPLANT
SHEATH PROBE COVER 6X72 (BAG) ×1 IMPLANT
SLED PULL BACK IVUS (MISCELLANEOUS) ×1 IMPLANT
TRANSDUCER W/STOPCOCK (MISCELLANEOUS) ×2 IMPLANT
TUBING CIL FLEX 10 FLL-RA (TUBING) ×2 IMPLANT

## 2019-11-14 NOTE — H&P (Signed)
OV 8/12 copied for documentation     Patient referred by No ref. provider found for CAD  Subjective:   Jonathon Cain, male    DOB: 1960-04-05, 59 y.o.   MRN: [id number]    HPI  59 y.o. 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  Patient is doing well. He has not had any recurrent lightheadedness episodes. Also denies chest pain with his level of activity. Recent echocardiogram reviewed with the patient.   He is enrolled in EMPACT-MI clinical trial.    No current facility-administered medications on file prior to encounter.   Current Outpatient Medications on File Prior to Encounter  Medication Sig Dispense Refill  . acetaminophen (TYLENOL) 325 MG tablet Take 2 tablets (650 mg total) by mouth every 4 (four) hours as needed for headache or mild pain.    [name] Kitchen aspirin 81 MG chewable tablet Chew 1 tablet (81 mg total) by mouth daily. 30 tablet 11  . atorvastatin (LIPITOR) 80 MG tablet Take 1 tablet (80 mg total) by mouth daily. 30 tablet 3  . cetirizine (ZYRTEC) 10 MG tablet Take 10 mg by mouth daily as needed for allergies.    . fluticasone (FLONASE) 50 MCG/ACT nasal spray Place 2 sprays into both nostrils daily as needed for allergies or rhinitis.     . furosemide (LASIX) 20 MG tablet Take 1 tablet (20 mg total) by mouth daily. (Patient taking differently: Take 20 mg by mouth daily as needed for fluid. ) 30 tablet 1  . metoprolol succinate (TOPROL XL) 25 MG 24 hr tablet Take 0.5 tablets (12.5 mg total) by mouth daily. 30 tablet 2  . nitroGLYCERIN (NITROSTAT) 0.4 MG SL tablet Place 1 tablet (0.4 mg total) under the tongue every 5 (five) minutes as needed for chest pain. 25 tablet 2  . pantoprazole (PROTONIX) 40 MG tablet TAKE 1 TABLET(40 MG) BY MOUTH DAILY 30 tablet 2  . spironolactone (ALDACTONE) 25 MG tablet Take 1 tablet  (25 mg total) by mouth daily. 90 tablet 3  . ticagrelor (BRILINTA) 90 MG TABS tablet Take 1 tablet (90 mg total) by mouth 2 (two) times daily. 60 tablet 11  . latanoprost (XALATAN) 0.005 % ophthalmic solution Place 1 drop into both eyes See admin instructions. Place 1 drop into each eye at bedtime for 2 months on/2 months off- alternate with Timolol (Patient not taking: Reported on 11/07/2019)    . timolol (TIMOPTIC) 0.5 % ophthalmic solution Place 1 drop into both eyes See admin instructions. Place 1 drop into both eyes two times a day for 2 months on/2 months off- alternate with Latanoprost (Patient not taking: Reported on 11/07/2019)      Cardiovascular and other pertinent studies:  EKG 08/02/2019: Sinus rhythm 52 bpm. Extensive anterolateral infarct, IVCD.  Echocardiogram 10/24/2019:  Left ventricle cavity is mildly dilated. Distal anteroseptal and apical  akinesis. LV endocardial border not well visualized to accurately asses  EF, probably around 30%. Recommend alternate imaging modality to  accurately assess EF.  Doppler evidence of grade III (restrictive)  diastolic dysfunction.  Left atrial cavity is severely dilated.  Moderate (Grade III) mitral regurgitation.  Inadequate TR jet to estimate PASP. Estimated RA pressure 8 mmHg.  No significant change compared to previous study on 07/27/2019.   Left Heart Catheterization 07/26/19 (Dr. Einar Gip):  LM: Mild diffuse disease LAD: Very large caliber vessel, occluded at the proximal  end.  Gives origin to high D1 which is large.  Successful PTCA and stenting of the proximal LAD overlapping D1, 3.5 x 24 mm Synergy DES deployed at 12 atmospheric pressure for 60 S. 100% to 0% with TIMI 0 to TIMI-3 flow. LCx: Large vessel, giving origin to a very large OM1 which is secondary branches.  Ostium of OM1 has 80% stenosis. RCA: Mild diffuse disease, distal RCA 15 to 20% stenosis, PDA 50% stenosis.  Recommendation: Patient will need aggressive risk factor  modification, dual antiplatelet therapy with aspirin and Brilinta for a year, will be watched carefully in the intensive care unit for preshock, suspect his EDP is extremely high at the end of the procedure with excessive contrast use.  Will monitor for any contrast nephropathy as well.  Recent labs: 08/17/2019: Glucose 90, BUN/Cr 17/1.21. EGFR 65. Na/K 136/4.9.  H/H 14/44. MCV 90. Platelets 289  07/26/2019: Glucose 122, BUN/Cr 9/0.9. EGFR 60. H/H 16.1/49.0. MCV 90.2. Platelets 240 HbA1C 5.5% Chol 230, TG 95, HDL 36, LDL 175   Review of Systems  Cardiovascular: Positive for dyspnea on exertion and orthopnea. Negative for chest pain, leg swelling, palpitations and syncope.  Gastrointestinal: Positive for diarrhea and melena.         Vitals:   11/14/19 1015 11/14/19 1103  BP: (!) 109/54   Pulse: (!) 55   Resp: 17   Temp: 98.6 F (37 C)   SpO2: 98% 99%     Body mass index is 37.1 kg/m. Filed Weights   11/14/19 1015  Weight: 120.7 kg     Objective:   Physical Exam Vitals and nursing note reviewed.  Constitutional:      General: He is not in acute distress. Neck:     Vascular: No JVD.  Cardiovascular:     Rate and Rhythm: Normal rate and regular rhythm.     Pulses: Intact distal pulses.     Heart sounds: Normal heart sounds. No murmur heard.   Pulmonary:     Effort: Pulmonary effort is normal.     Breath sounds: Normal breath sounds. No wheezing or rales.          Assessment & Recommendations:   59 y.o. 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  CAD: S/p primary PCI to prox LAD. Residual non-culprit 80% stenosis in large OM1. Although he does not have any angina symptoms at this time, his EF remains low. In order to achieve complete revascularization, recommend repeat coronary angiography, PCI to OM1 with IVUS, and  look at any other residual severe lesions.  Continue Aspirin/Brilinta till 07/2020. No melena. Continue atorvastatin 80 mg daily.  Schedule for cardiac catheterization, and elective intervention. We discussed regarding risks, benefits, alternatives to this including stress testing, CTA and continued medical therapy. Patient wants to proceed. Understands <1-2% risk of death, stroke, MI, urgent CABG, bleeding, infection, renal failure but not limited to these.   Ischemic cardiomyopathy: EF probably around 30%, although accurate estimation limited due to poor visualization of endocardial borders.  Currently on metoprolol succinate 12.5 mg daily, Entesto 24-26 mg bid, spironolactone 25 mg daily. Lasix as needed.  Recommend liberal hydration. Up titration limited due to low normal blood pressures. Will obtain MRI 3-4 weeks after complete revascularization. If EF remains <35%, will refer to EP for consideration for ICD for primary prevention. Continue LifeVest for now.   F/u after cath  [facility]  Esther Hardy, MD [facility] [place] Pager: [phone] Office: [phone]

## 2019-11-14 NOTE — Interval H&P Note (Signed)
History and Physical Interval Note:  11/14/2019 11:05 AM  Jonathon Cain  has presented today for surgery, with the diagnosis of heart failure - cad.  The various methods of treatment have been discussed with the patient and family. After consideration of risks, benefits and other options for treatment, the patient has consented to  Procedure(s): CORONARY STENT INTERVENTION (N/A) as a surgical intervention.  The patient's history has been reviewed, patient examined, no change in status, stable for surgery.  I have reviewed the patient's chart and labs.  Questions were answered to the patient's satisfaction.     Indication: PCI or CABG of 1 or more non-culprit arteries during the same hospitalization following successful treatment of STEMI culprit artery by primary PCI or fibrinolytic therapy in a patient with  Spontaneous or easily provoked symptoms of myocardial ischemia: No Noninvasive testing for ischemia: Not performed One or more additional severe (>=70% DS) stenoses present: Yes M (6) Indication: 12; Score 6   EF 25%  Tabb Croghan J Zahli Vetsch

## 2019-11-14 NOTE — Progress Notes (Signed)
[id number] Brief ed done as pt seen in May and he has been in CRP 2. Pt stated he has seen dietician in CRP 2 so did not review. Reviewed brilinta , NTG use, and gave walking instructions. Congratulated pt on not smoking since MAY. Referral back to [place] CRP 2 so they can follow up. Graylon Good RN BSN 11/14/2019 3:10 PM '

## 2019-11-14 NOTE — Discharge Instructions (Signed)
DRINK PLENTY OF FLUIDS FOR THE NEXT 2-3 DAYS.  KEEP ARM ELEVATED THE REMAINDER OF THE DAY.   Coronary Angiogram With Stent, Care After This sheet gives you information about how to care for yourself after your procedure. Your health care provider may also give you more specific instructions. If you have problems or questions, contact your health care provider. What can I expect after the procedure? After the procedure, it is common to have:  Bruising and tenderness at the insertion site. This usually fades within 1-2 weeks.  A collection of blood under the skin (hematoma). This usually decreases within 1-2 weeks. Follow these instructions at home: Medicines  Take over-the-counter and prescription medicines only as told by your health care provider.  If you were prescribed an antibiotic medicine, take it as told by your health care provider. Do not stop using the antibiotic even if you start to feel better.  If you take medicines for diabetes, your health care provider may need to change how much you take. Ask your health care provider for specific directions about taking your diabetes medicines.  If you are taking blood thinners: ? Talk with your health care provider before you take any medicines that contain aspirin or NSAIDs, such as ibuprofen. These medicines increase your risk for dangerous bleeding. ? Take your medicine exactly as told, at the same time every day. ? Avoid activities that could cause injury or bruising, and follow instructions about how to prevent falls. ? Wear a medical alert bracelet or carry a card that lists what medicines you take. Eating and drinking   Follow instructions from your health care provider about eating or drinking restrictions.  Eat a heart-healthy diet that includes plenty of fresh fruits and vegetables.  Avoid foods that are high in salt, sugar, or saturated fat. Avoid fried foods or canned or highly processed food.  Drink enough fluid to  keep your urine pale yellow. Alcohol use  Do not drink alcohol if: ? Your health care provider tells you not to. ? You are pregnant, may be pregnant, or plan to become pregnant.  If you drink alcohol: ? Limit how much you use to:  0-1 drink a day for women.  0-2 drinks a day for men. ? Be aware of how much alcohol is in your drink. In the [place] one drink equals one 12 oz bottle of beer (355 mL), one 5 oz glass of wine (148 mL), or one 1 oz glass of hard liquor (44 mL). Bathing  Do not take baths, swim, or use a hot tub until your health care provider approves. Ask your health care provider if you may take showers. You may only be allowed to take sponge baths.  Gently wash the insertion site with plain soap and water.  Pat the area dry with a clean towel. Do not rub. This may cause bleeding. Incision care  Follow instructions from your health care provider about how to take care of your insertion area. Make sure you: ? Wash your hands with soap and water before and after you change your bandage (dressing). If soap and water are not available, use hand sanitizer. ? Change your dressing as told by your health care provider. ? Leave stitches (sutures) or adhesive strips in place. These skin closures may need to stay in place for 2 weeks or longer. If adhesive strip edges start to loosen and curl up, you may trim the loose edges. Do not remove adhesive strips completely unless your health  care provider tells you to do that.  Do not apply powder or lotion on the insertion area.  Check your insertion area every day for signs of infection. Check for: ? Redness, swelling, or pain. ? Fluid or blood. ? Warmth. ? Pus or a bad smell. Activity  Do not drive for 24 hours if you were given a sedative during your procedure.  Rest as told by your health care provider. ? Avoid sitting for a long time without moving. Get up to take short walks every 1-2 hours. This is important to improve blood  flow and breathing. Ask for help if you feel weak or unsteady.  Do not lift anything that is heavier than 10 lb (4.5 kg), or the limit that you are told, until your health care provider says that it is safe.  Return to your normal activities as told by your health care provider. Ask your health care provider what activities are safe for you. Lifestyle   Do not use any products that contain nicotine or tobacco, such as cigarettes, e-cigarettes, and chewing tobacco. If you need help quitting, ask your health care provider.  If needed, work with your health care provider to treat other problems, such as being overweight, or having high blood pressure or diabetes.  Get regular exercise. Do exercises as told by your health care provider. General instructions  Tell all your health care providers that you have a stent. This is especially important if you are going to get imaging studies, such as MRI.  Wear compression stockings as told by your health care provider. These stockings help to prevent blood clots and reduce swelling in your legs.  Do not strain during a bowel movement if the procedure was done through your leg. Straining may cause bleeding from the insertion site.  Keep all follow-up visits as directed by your health care provider. This is important. Contact a health care provider if you:  Have a fever.  Have chills.  Have redness, swelling, or pain around your insertion area.  Have fluid or blood (other than a little blood on the dressing) coming from your insertion area.  Notice that your insertion area feels warm to the touch.  Have pus or a bad smell coming from your insertion area.  Have more bleeding from the insertion area. Hold pressure on the area. Get help right away if:  You develop chest pain or shortness of breath.  You feel like fainting or you faint.  Your leg or arm becomes cool, numb, or tingly.  You have unusual pain.  Your insertion area is  bleeding, and bleeding continues after 30 minutes of steadily held pressure.  You develop bleeding anywhere else, including from your rectum. There may be bright red blood in your urine or stool, or you may have black, tarry stool. These symptoms may represent a serious problem that is an emergency. Do not wait to see if the symptoms will go away. Get medical help right away. Call your local emergency services (911 in the [place] Do not drive yourself to the hospital. Summary  After this procedure, it is common to have bruising and tenderness around the catheter insertion site. This will go away in a few weeks.  Follow your health care provider's instructions about caring for your insertion site. Change dressing and clean the area as instructed.  Eat a heart-healthy diet. Limit alcohol use. Do not use tobacco or nicotine.  Contact a health care provider if you have fever or chills,  or if you have pus or a bad smell coming from the site.  Get help right away if you develop chest pain, you faint, or have bleeding at the insertion site. This information is not intended to replace advice given to you by your health care provider. Make sure you discuss any questions you have with your health care provider. Document Revised: 09/28/2018 Document Reviewed: 09/28/2018 Elsevier Patient Education  [place].

## 2019-11-15 ENCOUNTER — Encounter (HOSPITAL_COMMUNITY): Payer: Self-pay | Admitting: Cardiology

## 2019-11-15 ENCOUNTER — Encounter (HOSPITAL_COMMUNITY): Payer: Medicare HMO

## 2019-11-15 MED FILL — Midazolam HCl Inj 2 MG/2ML (Base Equivalent): INTRAMUSCULAR | Qty: 2 | Status: AC

## 2019-11-17 ENCOUNTER — Encounter (HOSPITAL_COMMUNITY): Payer: Medicare HMO

## 2019-11-17 ENCOUNTER — Telehealth (HOSPITAL_COMMUNITY): Payer: Self-pay

## 2019-11-17 NOTE — Telephone Encounter (Signed)
Pt insurance is active and benefits verified through [employer]. Co-pay $10.00, DED $0.00/$0.00 met, out of pocket $3,900.00/$3,353.87 met, co-insurance 0%. No pre-authorization required. Passport, 11/17/19 @ 10:41AM, REF#[id number]  Will contact patient to see if he is interested in the Cardiac Rehab Program. If interested, patient will need to complete follow up appt. Once completed, patient will be contacted for scheduling upon review by the RN Navigator. 

## 2019-11-17 NOTE — Telephone Encounter (Signed)
Called patient to see if he is interested in the Cardiac Rehab Program. Patient expressed interest. Explained scheduling process and went over insurance, patient verbalized understanding. Will contact patient for scheduling once f/u has been completed.  °

## 2019-11-22 ENCOUNTER — Other Ambulatory Visit: Payer: Self-pay

## 2019-11-22 ENCOUNTER — Ambulatory Visit: Payer: Medicare HMO | Admitting: Cardiology

## 2019-11-22 NOTE — Progress Notes (Signed)
Rescheduled

## 2019-11-24 ENCOUNTER — Other Ambulatory Visit: Payer: Self-pay | Admitting: Cardiology

## 2019-11-24 DIAGNOSIS — I5020 Unspecified systolic (congestive) heart failure: Secondary | ICD-10-CM

## 2019-12-06 ENCOUNTER — Other Ambulatory Visit: Payer: Self-pay

## 2019-12-06 ENCOUNTER — Ambulatory Visit: Payer: Medicare HMO | Admitting: Cardiology

## 2019-12-06 ENCOUNTER — Encounter: Payer: Self-pay | Admitting: Cardiology

## 2019-12-06 VITALS — BP 109/59 | HR 49 | Resp 17 | Ht 71.0 in | Wt 275.0 lb

## 2019-12-06 DIAGNOSIS — I2510 Atherosclerotic heart disease of native coronary artery without angina pectoris: Secondary | ICD-10-CM

## 2019-12-06 DIAGNOSIS — I5020 Unspecified systolic (congestive) heart failure: Secondary | ICD-10-CM

## 2019-12-06 NOTE — Progress Notes (Signed)
Patient referred by Lindie Spruce, MD for CAD  Subjective:   Jonathon Cain, male    DOB: Feb 06, 1961, 59 y.o.   MRN: [id number]   Chief Complaint  Patient presents with  . Coronary Artery Disease  . HFrEF  . Follow-up     HPI  59 y.o. 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   He is enrolled in EMPACT-MI clinical trial.    Current Outpatient Medications on File Prior to Visit  Medication Sig Dispense Refill  . acetaminophen (TYLENOL) 325 MG tablet Take 2 tablets (650 mg total) by mouth every 4 (four) hours as needed for headache or mild pain.    [name] Kitchen aspirin 81 MG chewable tablet Chew 1 tablet (81 mg total) by mouth daily. 30 tablet 11  . atorvastatin (LIPITOR) 80 MG tablet Take 1 tablet (80 mg total) by mouth daily. 30 tablet 3  . cetirizine (ZYRTEC) 10 MG tablet Take 10 mg by mouth daily as needed for allergies.    . fluticasone (FLONASE) 50 MCG/ACT nasal spray Place 2 sprays into both nostrils daily as needed for allergies or rhinitis.     . furosemide (LASIX) 20 MG tablet Take 1 tablet (20 mg total) by mouth daily. (Patient taking differently: Take 20 mg by mouth daily as needed for fluid. ) 30 tablet 1  . metoprolol succinate (TOPROL XL) 25 MG 24 hr tablet Take 0.5 tablets (12.5 mg total) by mouth daily. 30 tablet 2  . nitroGLYCERIN (NITROSTAT) 0.4 MG SL tablet Place 1 tablet (0.4 mg total) under the tongue every 5 (five) minutes as needed for chest pain. 25 tablet 2  . pantoprazole (PROTONIX) 40 MG tablet TAKE 1 TABLET(40 MG) BY MOUTH DAILY 30 tablet 2  . sacubitril-valsartan (ENTRESTO) 24-26 MG Take 1 tablet by mouth 2 (two) times daily. 60 tablet 1  . spironolactone (ALDACTONE) 25 MG tablet Take 1  tablet (25 mg total) by mouth daily. 90 tablet 3  . ticagrelor (BRILINTA) 90 MG TABS tablet Take 1 tablet (90 mg total) by mouth 2 (two) times daily. 60 tablet 11  . timolol (TIMOPTIC) 0.5 % ophthalmic solution Place 1 drop into both eyes See admin instructions. Place 1 drop into both eyes two times a day for 2 months on/2 months off- alternate with Latanoprost (Patient not taking: Reported on 11/07/2019)     No current facility-administered medications on file prior to visit.    Cardiovascular and other pertinent studies:  EKG 12/06/2019: Sinus bradycardia 45 bpm  Left anterior fascicular block  Poor R-wave progression Nonspecific T-abnormality   Echocardiogram 10/24/2019:  Left ventricle cavity is mildly dilated. Distal anteroseptal and apical  akinesis. LV endocardial border not well visualized to accurately asses  EF, probably around 30%. Recommend alternate imaging modality to  accurately assess EF.  Doppler evidence of grade III (restrictive)  diastolic dysfunction.  Left atrial cavity is severely dilated.  Moderate (Grade III) mitral regurgitation.  Inadequate TR jet to estimate PASP. Estimated RA pressure 8 mmHg.  No significant change compared to previous study on 07/27/2019.   Left Heart Catheterization 07/26/19 (Dr. Einar Gip):  LM: Mild diffuse disease LAD: Very large caliber vessel, occluded at the proximal end.  Gives origin to high D1 which is large.  Successful PTCA  and stenting of the proximal LAD overlapping D1, 3.5 x 24 mm Synergy DES deployed at 12 atmospheric pressure for 60 S. 100% to 0% with TIMI 0 to TIMI-3 flow. LCx: Large vessel, giving origin to a very large OM1 which is secondary branches.  Ostium of OM1 has 80% stenosis. RCA: Mild diffuse disease, distal RCA 15 to 20% stenosis, PDA 50% stenosis.  Recommendation: Patient will need aggressive risk factor modification, dual antiplatelet therapy with aspirin and Brilinta for a year, will be watched carefully in the  intensive care unit for preshock, suspect his EDP is extremely high at the end of the procedure with excessive contrast use.  Will monitor for any contrast nephropathy as well.  Recent labs: 08/17/2019: Glucose 90, BUN/Cr 17/1.21. EGFR 65. Na/K 136/4.9.  H/H 14/44. MCV 90. Platelets 289  07/26/2019: Glucose 122, BUN/Cr 9/0.9. EGFR 60. H/H 16.1/49.0. MCV 90.2. Platelets 240 HbA1C 5.5% Chol 230, TG 95, HDL 36, LDL 175   Review of Systems  Cardiovascular: Positive for dyspnea on exertion and orthopnea. Negative for chest pain, leg swelling, palpitations and syncope.  Gastrointestinal: Positive for diarrhea and melena.         Vitals:   12/06/19 1348  BP: (!) 109/59  Pulse: (!) 49  Resp: 17  SpO2: 97%     Body mass index is 38.35 kg/m. Filed Weights   12/06/19 1348  Weight: 275 lb (124.7 kg)     Objective:   Physical Exam Vitals and nursing note reviewed.  Constitutional:      General: He is not in acute distress. Neck:     Vascular: No JVD.  Cardiovascular:     Rate and Rhythm: Normal rate and regular rhythm.     Pulses: Intact distal pulses.     Heart sounds: Normal heart sounds. No murmur heard.   Pulmonary:     Effort: Pulmonary effort is normal.     Breath sounds: Normal breath sounds. No wheezing or rales.          Assessment & Recommendations:   59 y.o. 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 dFR negative 80% stenosis in OM1 LAD stent optimized under IVUS (11/14/2019) Cardiac MRI pending to evaluate EF  Ischemic cardiomyopathy: Pending cardiac MRI for accurate EF assessment Currently on metoprolol succinate 12.5 mg daily, Entesto 24-26 mg bid, spironolactone 25 mg daily. Lasix as needed.  Recommend liberal  hydration. Up titration limited due to low normal blood pressures. Will obtain MRI. If EF remains <35%, will refer to EP for consideration for ICD for primary prevention. Continue LifeVest for now.   F/u 3 months  Jag Lenz Esther Hardy, MD [facility] Pager: [phone] Office: [phone]

## 2019-12-11 ENCOUNTER — Telehealth: Payer: Self-pay | Admitting: *Deleted

## 2019-12-11 ENCOUNTER — Encounter: Payer: Self-pay | Admitting: Cardiology

## 2019-12-11 NOTE — Telephone Encounter (Signed)
Spoke with patient regarding appointment for Cardiac MRI scheduled Thursday 12/28/19 at 12:00pm at [facility]---arrival time is 11:30 am--1st floor admissions office .  Will mail information to patient and he voiced his understanding.

## 2019-12-14 ENCOUNTER — Telehealth (HOSPITAL_COMMUNITY): Payer: Self-pay

## 2019-12-14 NOTE — Telephone Encounter (Signed)
Sent pt ppw in to the transportation services for his cardiac rehab vist that's on 12/21/2019.

## 2019-12-15 ENCOUNTER — Encounter (HOSPITAL_COMMUNITY): Payer: Self-pay

## 2019-12-15 NOTE — Telephone Encounter (Signed)
Cardiac Rehab Medication Review by a Pharmacist  Does the patient  feel that his/her medications are working for him/her?  Yes, feels stronger, more fluid is coming off with furosemide  Has the patient been experiencing any side effects to the medications prescribed?  Some diarrhea but otherwise no.   Does the patient measure his/her own blood pressure or blood glucose at home?  No - does not check  Does the patient have any problems obtaining medications due to transportation or finances?   Sometimes - has to coordinate with people that give him rides. Typically doesn't pick up prescriptions late.   Understanding of regimen: good Understanding of indications: good Potential of compliance: good  Mercy Riding, PharmD PGY1 Acute Care Pharmacy Resident Please refer to [facility] for unit-specific pharmacist

## 2019-12-19 ENCOUNTER — Encounter (HOSPITAL_COMMUNITY)
Admission: RE | Admit: 2019-12-19 | Discharge: 2019-12-19 | Disposition: A | Discharge status: Home / Self Care | Payer: Medicare HMO | Source: Ambulatory Visit | Attending: Cardiology | Admitting: Cardiology

## 2019-12-19 ENCOUNTER — Other Ambulatory Visit: Payer: Self-pay

## 2019-12-19 DIAGNOSIS — I2109 ST elevation (STEMI) myocardial infarction involving other coronary artery of anterior wall: Secondary | ICD-10-CM | POA: Insufficient documentation

## 2019-12-19 DIAGNOSIS — Z955 Presence of coronary angioplasty implant and graft: Secondary | ICD-10-CM | POA: Insufficient documentation

## 2019-12-19 NOTE — Progress Notes (Signed)
Cardiac Rehab Telephone Note:  Successful telephone encounter to Jonathon Cain to confirm Cardiac Rehab orientation appointment for 12/20/00 at 2:00. Nursing assessment completed. Patient questions answered. Instructions for appointment provided. Patient screening for Covid-19 negative.  Disa Riedlinger E. Rollene Rotunda RN, BSN [place]. [facility]  Cardiac and Pulmonary Rehabilitation Phone: [phone] Fax: [phone]

## 2019-12-21 ENCOUNTER — Encounter (HOSPITAL_COMMUNITY)
Admission: RE | Admit: 2019-12-21 | Discharge: 2019-12-21 | Disposition: A | Discharge status: Home / Self Care | Payer: Medicare HMO | Source: Ambulatory Visit | Attending: Cardiology | Admitting: Cardiology

## 2019-12-21 ENCOUNTER — Telehealth: Payer: Self-pay

## 2019-12-21 ENCOUNTER — Encounter (HOSPITAL_COMMUNITY): Payer: Self-pay

## 2019-12-21 ENCOUNTER — Other Ambulatory Visit: Payer: Self-pay

## 2019-12-21 VITALS — BP 101/67 | HR 62 | Ht 71.0 in | Wt 272.7 lb

## 2019-12-21 DIAGNOSIS — Z955 Presence of coronary angioplasty implant and graft: Secondary | ICD-10-CM

## 2019-12-21 DIAGNOSIS — I2109 ST elevation (STEMI) myocardial infarction involving other coronary artery of anterior wall: Secondary | ICD-10-CM

## 2019-12-21 HISTORY — DX: Heart failure, unspecified: I50.9

## 2019-12-21 HISTORY — DX: Atherosclerotic heart disease of native coronary artery without angina pectoris: I25.10

## 2019-12-21 NOTE — Telephone Encounter (Signed)
If no symptoms of lightheadedness, okay to continue cardiac rehab. Pulse rate is baseline for him.   Thanks MJP

## 2019-12-21 NOTE — Telephone Encounter (Signed)
A nurse for cardiac reha called in regard to pt pulse. Pt was here for walk test. Pt bp is 101/67 pulse: 48-53

## 2019-12-21 NOTE — Progress Notes (Signed)
Patient here for orientation. Initial BP 101/67. Resting heart rate 48-53. Oxygen saturation 95% on room air. Telemetry rhythm sinus brady Sinus Rhythm. Repeat blood pressure 93/65 sitting. Standing blood pressure 10/68 standing. Dr Bonney Roussel office called and notified spoke with Magda Paganini. Dr Virgina Jock said that he is aware of the patient's brady cardiac and as long as the patient is asymptomatic he may proceed with the walk test. After Jonathon Cain completed his walk test his blood pressure was noted at 88/50 heart rate of 89. Patient reported feeling a little light headed and was given water. Repeat blood pressure 90/60 heart rate 46. Dr Virgina Jock was called and notified about today's vital signs. Dr Virgina Jock said that as long as his systolic blood pressure is greater than 80 and his pulse is greater than 40 Jonathon Cain may proceed with exercise as long as he is asymptomatic. Will continue to monitor the patient throughout  the program.Patient is aware and agreeable to the plan.Barnet Pall, RN,BSN 12/21/2019 4:31 PM

## 2019-12-21 NOTE — Telephone Encounter (Signed)
Informed the nurse

## 2019-12-21 NOTE — Progress Notes (Signed)
Cardiac Individual Treatment Plan  Patient Details  Name: Jonathon Cain MRN: [id number] Date of Birth: 07-Sep-1960 Referring Provider:     [place] from 12/21/2019 in [place]  Referring Provider Vernell Leep, MD      Initial Encounter Date:    CARDIAC REHAB PHASE II ORIENTATION from 12/21/2019 in [place]  Date 12/21/19      Visit Diagnosis: Acute ST elevation myocardial infarction (STEMI) of anterolateral wall ([place]) 07/26/19  Status post coronary artery stent placement 07/26/19  Patient's Home Medications on Admission:  Current Outpatient Medications:  .  acetaminophen (TYLENOL) 325 MG tablet, Take 2 tablets (650 mg total) by mouth every 4 (four) hours as needed for headache or mild pain., Disp:  , Rfl:  .  aspirin 81 MG chewable tablet, Chew 1 tablet (81 mg total) by mouth daily., Disp: 30 tablet, Rfl: 11 .  atorvastatin (LIPITOR) 80 MG tablet, Take 1 tablet (80 mg total) by mouth daily., Disp: 30 tablet, Rfl: 3 .  cetirizine (ZYRTEC) 10 MG tablet, Take 10 mg by mouth daily as needed for allergies., Disp: , Rfl:  .  fluticasone (FLONASE) 50 MCG/ACT nasal spray, Place 2 sprays into both nostrils daily as needed for allergies or rhinitis. , Disp: , Rfl:  .  furosemide (LASIX) 20 MG tablet, Take 1 tablet (20 mg total) by mouth daily. (Patient taking differently: Take 20 mg by mouth daily as needed for fluid. ), Disp: 30 tablet, Rfl: 1 .  metoprolol succinate (TOPROL XL) 25 MG 24 hr tablet, Take 0.5 tablets (12.5 mg total) by mouth daily., Disp: 30 tablet, Rfl: 2 .  nitroGLYCERIN (NITROSTAT) 0.4 MG SL tablet, Place 1 tablet (0.4 mg total) under the tongue every 5 (five) minutes as needed for chest pain., Disp: 25 tablet, Rfl: 2 .  pantoprazole (PROTONIX) 40 MG tablet, TAKE 1 TABLET(40 MG) BY MOUTH DAILY, Disp: 30 tablet, Rfl: 2 .  sacubitril-valsartan (ENTRESTO) 24-26 MG, Take 1 tablet by  mouth 2 (two) times daily., Disp: 60 tablet, Rfl: 1 .  spironolactone (ALDACTONE) 25 MG tablet, Take 1 tablet (25 mg total) by mouth daily., Disp: 90 tablet, Rfl: 3 .  tamsulosin (FLOMAX) 0.4 MG CAPS capsule, Take 1 capsule by mouth daily as needed. (Patient not taking: Reported on 12/15/2019), Disp: , Rfl:  .  ticagrelor (BRILINTA) 90 MG TABS tablet, Take 1 tablet (90 mg total) by mouth 2 (two) times daily., Disp: 60 tablet, Rfl: 11 .  timolol (TIMOPTIC) 0.5 % ophthalmic solution, Place 1 drop into both eyes See admin instructions. Place 1 drop into both eyes two times a day for 2 months on/2 months off- alternate with Latanoprost (Patient not taking: Reported on 12/15/2019), Disp: , Rfl:   Past Medical History: Past Medical History:  Diagnosis Date  . Bladder outlet obstruction   . CHF (congestive heart failure) ([place])   . Coronary artery disease   . Hyperlipidemia   . Hypertension   . Retinitis pigmentosa     Tobacco Use: Social History   Tobacco Use  Smoking Status Former Smoker  . Packs/day: 1.00  . Years: 10.00  . Pack years: 10.00  . Types: Cigarettes  . Quit date: 07/26/2019  . Years since quitting: 0.4  Smokeless Tobacco Never Used    Labs: Recent Review Scientist, physiological    Labs for ITP Cardiac and Pulmonary Rehab Latest Ref Rng & Units 07/26/2019 07/26/2019 07/26/2019   Cholestrol 0 -  200 mg/dL 230(H) - -   LDLCALC 0 - 99 mg/dL 175(H) - -   HDL >40 mg/dL 36(L) - -   Trlycerides <150 mg/dL 95 - -   Hemoglobin A1c 4.8 - 5.6 % 5.5 - -   PHART 7.35 - 7.45 - - 7.436   PCO2ART 32 - 48 mmHg - - 38.0   HCO3 20.0 - 28.0 mmol/L - - 25.6   TCO2 22 - 32 mmol/L - 28 27   O2SAT % - - 99.0      Capillary Blood Glucose: Lab Results  Component Value Date   GLUCAP 91 10/25/2019     Exercise Target Goals: Exercise Program Goal: Individual exercise prescription set using results from initial 6 min walk test and THRR while considering  patient's activity barriers and safety.    Exercise Prescription Goal: Starting with aerobic activity 30 plus minutes a day, 3 days per week for initial exercise prescription. Provide home exercise prescription and guidelines that participant acknowledges understanding prior to discharge.  Activity Barriers & Risk Stratification:  Activity Barriers & Cardiac Risk Stratification - 12/21/19 1521      Activity Barriers & Cardiac Risk Stratification   Activity Barriers Deconditioning;Decreased Ventricular Function;Balance Concerns;Assistive Device;Other (comment)    Comments Legally blind, has loss of perpherial vision. Uses cane    Cardiac Risk Stratification High           6 Minute Walk:  6 Minute Walk    Row Name 09/19/19 1438 12/21/19 1518       6 Minute Walk   Phase Initial Initial    Distance 900 feet 800 feet    Walk Time 6 minutes 6 minutes    # of Rest Breaks 1 1    MPH 1.7 1.52    METS 1.79 1.51    RPE 9 14    Perceived Dyspnea  1 1    VO2 Peak 6.29 5.28    Symptoms Yes (comment) Yes (comment)    Comments Bilateral leg pain (4 on 0-10 scale). SOB, RPD = 1 SOB, Fatigue, RPD = 1    Resting HR 56 bpm 62 bpm    Resting BP 94/60 101/67    Resting Oxygen Saturation  97 % 95 %    Exercise Oxygen Saturation  during 6 min walk 97 % 97 %    Max Ex. HR 92 bpm 89 bpm    Max Ex. BP 108/63 88/60    2 Minute Post BP 115/73 90/60           Oxygen Initial Assessment:   Oxygen Re-Evaluation:   Oxygen Discharge (Final Oxygen Re-Evaluation):   Initial Exercise Prescription:  Initial Exercise Prescription - 12/21/19 1500      Date of Initial Exercise RX and Referring Provider   Date 12/21/19    Referring Provider Vernell Leep, MD    Expected Discharge Date 02/14/20      NuStep   Level 2    SPM 75    Minutes 30    METs 1.7      Prescription Details   Frequency (times per week) 3      Intensity   THRR 40-80% of Max Heartrate 64-127    Ratings of Perceived Exertion 11-13    Perceived Dyspnea 0-4       Resistance Training   Training Prescription Yes    Weight 3 lbs    Reps 10-15           Perform Capillary Blood  Glucose checks as needed.  Exercise Prescription Changes:   Exercise Prescription Changes    Row Name 09/27/19 1450 10/11/19 1600 11/01/19 1600         Response to Exercise   Blood Pressure (Admit) 90/50 102/62 120/70     Blood Pressure (Exercise) 128/73 106/68 108/70     Blood Pressure (Exit) 107/70 102/60 102/64     Heart Rate (Admit) 72 bpm 66 bpm 79 bpm     Heart Rate (Exercise) 83 bpm 91 bpm 83 bpm     Heart Rate (Exit) 57 bpm 57 bpm 63 bpm     Rating of Perceived Exertion (Exercise) 9 11 11      Symptoms None None None     Comments Pt's first day of exercise. Tolerated well Reviwed METs Reviewed METS and Goals     Duration Progress to 30 minutes of  aerobic without signs/symptoms of physical distress Progress to 30 minutes of  aerobic without signs/symptoms of physical distress Progress to 30 minutes of  aerobic without signs/symptoms of physical distress     Intensity THRR unchanged THRR unchanged THRR unchanged       Progression   Progression Continue to progress workloads to maintain intensity without signs/symptoms of physical distress. Continue to progress workloads to maintain intensity without signs/symptoms of physical distress. Continue to progress workloads to maintain intensity without signs/symptoms of physical distress.     Average METs 1.7 1.7 --       Resistance Training   Training Prescription No No No     Weight --  No wegits done on Wednesday. Pt will begin 3 lb wts 09/29/19 --  Weights not done on Wednesday. Uses 3 lb wts M-F --  Uses 3 lb wts on M, F       Interval Training   Interval Training No No No       NuStep   Level 1 1 2      SPM 75 75 75     Minutes 30 30 30      METs 1.7 1.7 2       Home Exercise Plan   Plans to continue exercise at -- -- Home (comment)     Frequency -- -- Add 2 additional days to program exercise  sessions.     Initial Home Exercises Provided -- -- 10/16/19            Exercise Comments:   Exercise Comments    Row Name 09/27/19 1503 10/11/19 1618 10/16/19 1415 11/01/19 1622     Exercise Comments Pt's first day of exercise. Pt tolerated session well with no complaints. Reviewed METs today. Reviewed home exercise program with patient and provied pt a copy. Pt verblaized understnading. See Notes. Reviewed METs and goals. Walking at home has been limited by the heat and humidity outdoors. However, a short term goal the patient had was to be able to walk to his mailbox. He can now do that but still finds coming back to the house diffiuclt.           Exercise Goals and Review:   Exercise Goals    Row Name 09/19/19 1537 12/21/19 1522           Exercise Goals   Increase Physical Activity Yes Yes      Intervention -- Provide advice, education, support and counseling about physical activity/exercise needs.;Develop an individualized exercise prescription for aerobic and resistive training based on initial evaluation findings, risk stratification, comorbidities and participant's personal goals.  Expected Outcomes Short Term: Attend rehab on a regular basis to increase amount of physical activity.;Long Term: Add in home exercise to make exercise part of routine and to increase amount of physical activity.;Long Term: Exercising regularly at least 3-5 days a week. Short Term: Attend rehab on a regular basis to increase amount of physical activity.;Long Term: Add in home exercise to make exercise part of routine and to increase amount of physical activity.;Long Term: Exercising regularly at least 3-5 days a week.      Increase Strength and Stamina Yes Yes      Intervention Provide advice, education, support and counseling about physical activity/exercise needs.;Develop an individualized exercise prescription for aerobic and resistive training based on initial evaluation findings, risk  stratification, comorbidities and participant's personal goals. Provide advice, education, support and counseling about physical activity/exercise needs.;Develop an individualized exercise prescription for aerobic and resistive training based on initial evaluation findings, risk stratification, comorbidities and participant's personal goals.      Expected Outcomes Short Term: Increase workloads from initial exercise prescription for resistance, speed, and METs.;Short Term: Perform resistance training exercises routinely during rehab and add in resistance training at home;Long Term: Improve cardiorespiratory fitness, muscular endurance and strength as measured by increased METs and functional capacity (6MWT) Short Term: Increase workloads from initial exercise prescription for resistance, speed, and METs.;Short Term: Perform resistance training exercises routinely during rehab and add in resistance training at home;Long Term: Improve cardiorespiratory fitness, muscular endurance and strength as measured by increased METs and functional capacity (6MWT)      Able to understand and use rate of perceived exertion (RPE) scale Yes Yes      Intervention Provide education and explanation on how to use RPE scale Provide education and explanation on how to use RPE scale      Expected Outcomes Short Term: Able to use RPE daily in rehab to express subjective intensity level;Long Term:  Able to use RPE to guide intensity level when exercising independently Short Term: Able to use RPE daily in rehab to express subjective intensity level;Long Term:  Able to use RPE to guide intensity level when exercising independently      Knowledge and understanding of Target Heart Rate Range (THRR) Yes Yes      Expected Outcomes Short Term: Able to use daily as guideline for intensity in rehab;Long Term: Able to use THRR to govern intensity when exercising independently;Short Term: Able to state/look up THRR Short Term: Able to use daily  as guideline for intensity in rehab;Long Term: Able to use THRR to govern intensity when exercising independently;Short Term: Able to state/look up THRR      Able to check pulse independently Yes Yes      Intervention Provide education and demonstration on how to check pulse in carotid and radial arteries.;Review the importance of being able to check your own pulse for safety during independent exercise Provide education and demonstration on how to check pulse in carotid and radial arteries.;Review the importance of being able to check your own pulse for safety during independent exercise      Expected Outcomes Short Term: Able to explain why pulse checking is important during independent exercise;Long Term: Able to check pulse independently and accurately Short Term: Able to explain why pulse checking is important during independent exercise;Long Term: Able to check pulse independently and accurately      Understanding of Exercise Prescription Yes Yes      Intervention Provide education, explanation, and written materials on patient's individual exercise  prescription Provide education, explanation, and written materials on patient's individual exercise prescription      Expected Outcomes Short Term: Able to explain program exercise prescription;Long Term: Able to explain home exercise prescription to exercise independently Short Term: Able to explain program exercise prescription;Long Term: Able to explain home exercise prescription to exercise independently             Exercise Goals Re-Evaluation :  Exercise Goals Re-Evaluation    Row Name 09/27/19 1500 10/16/19 1415 11/01/19 1619         Exercise Goal Re-Evaluation   Exercise Goals Review Increase Physical Activity;Increase Strength and Stamina;Able to understand and use rate of perceived exertion (RPE) scale;Knowledge and understanding of Target Heart Rate Range (THRR);Able to check pulse independently;Understanding of Exercise Prescription  Increase Physical Activity;Increase Strength and Stamina;Able to understand and use rate of perceived exertion (RPE) scale;Knowledge and understanding of Target Heart Rate Range (THRR);Able to check pulse independently;Understanding of Exercise Prescription Increase Physical Activity;Increase Strength and Stamina;Able to understand and use rate of perceived exertion (RPE) scale;Knowledge and understanding of Target Heart Rate Range (THRR);Able to check pulse independently;Understanding of Exercise Prescription     Comments Pt's first day of CRP2 program. PT tolerated the session well with no complaints. Pt understands Ex Rx, THRR, and RPE scale Reviwed home exercise Rx with patient. Due to patients vision loss he walks on the ramp to his deck and holds the handle for safety. He usually does this for about 10 minutes.We discusssed doing it more than once a day and he may try to do that.H e also has a Wii and will does the sking or boxing for exercise. Due to his visual limitations this could be an adjuct to the walking. Reviwed METs and goals. Pt continues to try and walk but is limited by the hot, humid weather. Voices walking in his driveway. Has not used Wii at home,. Will continue to encourage any amount of additional physical activity.     Expected Outcomes Will continue to monitor and progress patient as tolerated. Pt will exercise at home 2x/wk by walking the ramp on his deck or using his Wii for a total of 30 minutes of exercise. Will exercise where he a cumulative total of 30 minutes 2 x/wk.             Discharge Exercise Prescription (Final Exercise Prescription Changes):  Exercise Prescription Changes - 11/01/19 1600      Response to Exercise   Blood Pressure (Admit) 120/70    Blood Pressure (Exercise) 108/70    Blood Pressure (Exit) 102/64    Heart Rate (Admit) 79 bpm    Heart Rate (Exercise) 83 bpm    Heart Rate (Exit) 63 bpm    Rating of Perceived Exertion (Exercise) 11    Symptoms  None    Comments Reviewed METS and Goals    Duration Progress to 30 minutes of  aerobic without signs/symptoms of physical distress    Intensity THRR unchanged      Progression   Progression Continue to progress workloads to maintain intensity without signs/symptoms of physical distress.      Resistance Training   Training Prescription No    Weight --   Uses 3 lb wts on M, F     Interval Training   Interval Training No      NuStep   Level 2    SPM 75    Minutes 30    METs 2  Home Exercise Plan   Plans to continue exercise at Home (comment)    Frequency Add 2 additional days to program exercise sessions.    Initial Home Exercises Provided 10/16/19           Nutrition:  Target Goals: Understanding of nutrition guidelines, daily intake of sodium 1500mg , cholesterol 200mg , calories 30% from fat and 7% or less from saturated fats, daily to have 5 or more servings of fruits and vegetables.  Biometrics:  Pre Biometrics - 12/21/19 1400      Pre Biometrics   Waist Circumference 52 inches    Hip Circumference 49 inches    Waist to Hip Ratio 1.06 %    Triceps Skinfold 33 mm    % Body Fat 39.7 %    Grip Strength 39 kg    Flexibility --   Not done to h/o back surgery   Single Leg Stand --   Not dome: Fall risk, uses cane, visually impaired           Nutrition Therapy Plan and Nutrition Goals:  Nutrition Therapy & Goals - 10/03/19 1459      Nutrition Therapy   Diet Heart healthy      Personal Nutrition Goals   Nutrition Goal Pt to build a healthy plate including vegetables, fruits, whole grains, and low-fat dairy products in a heart healthy meal plan.    Personal Goal #2 Pt to identify food quantities necessary to achieve weight loss of 6-24 lb at graduation from cardiac rehab. Pt to weigh and measure serving sizes      Intervention Plan   Intervention Prescribe, educate and counsel regarding individualized specific dietary modifications aiming towards targeted  core components such as weight, hypertension, lipid management, diabetes, heart failure and other comorbidities.    Expected Outcomes Short Term Goal: Understand basic principles of dietary content, such as calories, fat, sodium, cholesterol and nutrients.           Nutrition Assessments:   Nutrition Goals Re-Evaluation:  Nutrition Goals Re-Evaluation    Jonathon Cain Name 10/03/19 1459 10/31/19 1335           Goals   Current Weight 275 lb (124.7 kg) 273 lb (123.8 kg)      Nutrition Goal -- Pt to build a healthy plate including vegetables, fruits, whole grains, and low-fat dairy products in a heart healthy meal plan.        Personal Goal #2 Re-Evaluation   Personal Goal #2 -- Pt to identify food quantities necessary to achieve weight loss of 6-24 lb at graduation from cardiac rehab. Pt to weigh and measure serving sizes             Nutrition Goals Discharge (Final Nutrition Goals Re-Evaluation):  Nutrition Goals Re-Evaluation - 10/31/19 1335      Goals   Current Weight 273 lb (123.8 kg)    Nutrition Goal Pt to build a healthy plate including vegetables, fruits, whole grains, and low-fat dairy products in a heart healthy meal plan.      Personal Goal #2 Re-Evaluation   Personal Goal #2 Pt to identify food quantities necessary to achieve weight loss of 6-24 lb at graduation from cardiac rehab. Pt to weigh and measure serving sizes           Psychosocial: Target Goals: Acknowledge presence or absence of significant depression and/or stress, maximize coping skills, provide positive support system. Participant is able to verbalize types and ability to use techniques and skills needed for reducing  stress and depression.  Initial Review & Psychosocial Screening:  Initial Psych Review & Screening - 12/21/19 1556      Initial Review   Current issues with Current Depression;History of Depression      Family Dynamics   Good Support System? Yes   Roma lives alone. Jonathon Cain has support of  his mother, brother and uncle   Comments Patient is visually impaired denies being depressed currently      Barriers   Psychosocial barriers to participate in program The patient should benefit from training in stress management and relaxation.      Screening Interventions   Interventions Encouraged to exercise;To provide support and resources with identified psychosocial needs;Provide feedback about the scores to participant    Expected Outcomes Short Term goal: Utilizing psychosocial counselor, staff and physician to assist with identification of specific Stressors or current issues interfering with healing process. Setting desired goal for each stressor or current issue identified.;Long Term Goal: Stressors or current issues are controlled or eliminated.;Short Term goal: Identification and review with participant of any Quality of Life or Depression concerns found by scoring the questionnaire.;Long Term goal: The participant improves quality of Life and PHQ9 Scores as seen by post scores and/or verbalization of changes           Quality of Life Scores:  Quality of Life - 12/21/19 1509      Quality of Life   Select Quality of Life      Quality of Life Scores   Health/Function Pre 17.14 %    Socioeconomic Pre 23 %    Psych/Spiritual Pre 17.14 %    Family Pre 18 %    GLOBAL Pre 18.4 %          Scores of 19 and below usually indicate a poorer quality of life in these areas.  A difference of  2-3 points is a clinically meaningful difference.  A difference of 2-3 points in the total score of the Quality of Life Index has been associated with significant improvement in overall quality of life, self-image, physical symptoms, and general health in studies assessing change in quality of life.  PHQ-9: Recent Review Flowsheet Data    Depression screen [facility] 2/9 12/21/2019 09/19/2019   Decreased Interest 0 3   Down, Depressed, Hopeless 0 0   PHQ - 2 Score 0 3   Altered sleeping - 0   Tired,  decreased energy - 3   Change in appetite - 3   Feeling bad or failure about yourself  - 0   Trouble concentrating - 0   Moving slowly or fidgety/restless - 0   Suicidal thoughts - 0   PHQ-9 Score - 9   Difficult doing work/chores - Not difficult at all     Interpretation of Total Score  Total Score Depression Severity:  1-4 = Minimal depression, 5-9 = Mild depression, 10-14 = Moderate depression, 15-19 = Moderately severe depression, 20-27 = Severe depression   Psychosocial Evaluation and Intervention:  Psychosocial Evaluation - 09/27/19 1442      Psychosocial Evaluation & Interventions   Comments Mr. 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 States Covid has not helped his feeling of isolation. He has a very supportive mother who lives in [place]. He has a brother, nephews, aunts and uncles as support. He does not drive and relies on public/private transportation. Now that he has CAD, he  feels he is limited in his ability to travel out of state. He is financially sound and hopes that once his cardiac function improves he will be able to take his nephew to [place]. He has no hobbies. He declines referral to [facility] [employer]. He declines referral for counseling. States he has declined antidepressants from PCP. PHQ9 score 9    Expected Outcomes Mr. Jonathon Cain will continue to manage his depression symptoms independently as long as they do not worsen. He will consider discussing with PCP and request referral for counseling and consideration for medications. He will maintain good communication with [facility] staff related to depression symptoms.           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 States Covid has not helped his feeling of isolation. He has a very supportive mother who lives in [place]. He has a brother, nephews, aunts and uncles as support. He does not drive and relies on public/private transportation. Now that he has CAD, he feels he is limited in his ability to travel out of state. He is financially sound and hopes that once his cardiac function improves he will be able to take his nephew to [place]. He has no hobbies. He declines referral to [employer]. He declines referral for counseling. States he has declined antidepressants from PCP. PHQ9 score 9 upon admission. Jonathon Cain admits to enjoying [facility] for socialization 3 x week. 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 States Covid has not helped his feeling of isolation. He has a very supportive mother who lives in [place]. He has a brother, nephews, aunts and uncles as support. He does not drive and relies on public/private transportation. Now that he has CAD, he feels he is limited in his ability to travel out of state. He is financially sound and hopes that once his cardiac function improves he will be able to take his nephew to [place]. He has no hobbies. He declines referral to [facility] [employer]. He declines referral for counseling. States he has declined antidepressants from PCP. PHQ9 score 9 upon admission. Jonathon Cain admits to enjoying CR for socialization 3 x week.      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 States Covid has not helped his feeling of isolation. He has a very supportive mother who lives in [place]. He has a brother, nephews, aunts and uncles as support. He does not drive and relies on public/private transportation.  Now that he has CAD, he feels he is limited in his ability to travel out of state. He is financially sound and hopes that once his cardiac function improves he will be able to take his nephew to [place]. He has no hobbies. He declines referral to [employer]. He declines referral for counseling. States he has declined antidepressants from PCP. PHQ9 score 9 upon admission. Jonathon Cain admits to enjoying CR for socialization 3 x week.    Interventions Encouraged to attend Cardiac Rehabilitation for the exercise    Continue Psychosocial Services  Follow up required by staff           Vocational Rehabilitation: Provide vocational rehab assistance to qualifying candidates.   Vocational Rehab Evaluation & Intervention:  Vocational Rehab - 12/21/19 1559      Initial Vocational Rehab Evaluation & Intervention   Assessment shows need for Vocational Rehabilitation No   Jonathon Cain is disabled and does not need vocational rehab at this time          Education: Education Goals: Education classes will be provided on a weekly basis, covering required topics. Participant will state understanding/return demonstration of topics presented.  Learning Barriers/Preferences:  Learning Barriers/Preferences - 12/21/19 1517      Learning Barriers/Preferences   Learning Barriers Sight    Learning Preferences Verbal  Instruction;Audio           Education Topics: Hypertension, Hypertension Reduction -Define heart disease and high blood pressure. Discus how high blood pressure affects the body and ways to reduce high blood pressure.   Exercise and Your Heart -Discuss why it is important to exercise, the FITT principles of exercise, normal and abnormal responses to exercise, and how to exercise safely.   Angina -Discuss definition of angina, causes of angina, treatment of angina, and how to decrease risk of having angina.   Cardiac Medications -Review what the following cardiac medications are used for, how they affect the body, and side effects that may occur when taking the medications.  Medications include Aspirin, Beta blockers, calcium channel blockers, ACE Inhibitors, angiotensin receptor blockers, diuretics, digoxin, and antihyperlipidemics.   Congestive Heart Failure -Discuss the definition of CHF, how to live with CHF, the signs and symptoms of CHF, and how keep track of weight and sodium intake.   Heart Disease and Intimacy -Discus the effect sexual activity has on the heart, how changes occur during intimacy as we age, and safety during sexual activity.   Smoking Cessation / COPD -Discuss different methods to quit smoking, the health benefits of quitting smoking, and the definition of COPD.   Nutrition I: Fats -Discuss the types of cholesterol, what cholesterol does to the heart, and how cholesterol levels can be controlled.   Nutrition II: Labels -Discuss the different components of food labels and how to read food label   Heart Parts/Heart Disease and PAD -Discuss the anatomy of the heart, the pathway of blood circulation through the heart, and these are affected by heart disease.   Stress I: Signs and Symptoms -Discuss the causes of stress, how stress may lead to anxiety and depression, and ways to limit stress.   Stress II: Relaxation -Discuss different types of  relaxation techniques to limit stress.   Warning Signs of Stroke / TIA -Discuss definition of a stroke, what the signs and symptoms are of a stroke, and how to identify when someone is having stroke.   Knowledge Questionnaire Score:  Knowledge Questionnaire Score - 12/21/19 1516  Knowledge Questionnaire Score   Pre Score 23/24           Core Components/Risk Factors/Patient Goals at Admission:  Personal Goals and Risk Factors at Admission - 12/21/19 1600      Core Components/Risk Factors/Patient Goals on Admission    Weight Management Yes;Weight Loss    Intervention Weight Management: Develop a combined nutrition and exercise program designed to reach desired caloric intake, while maintaining appropriate intake of nutrient and fiber, sodium and fats, and appropriate energy expenditure required for the weight goal.;Weight Management: Provide education and appropriate resources to help participant work on and attain dietary goals.;Weight Management/Obesity: Establish reasonable short term and long term weight goals.;Obesity: Provide education and appropriate resources to help participant work on and attain dietary goals.    Admit Weight 272 lb 11.3 oz (123.7 kg)    Expected Outcomes Short Term: Continue to assess and modify interventions until short term weight is achieved;Long Term: Adherence to nutrition and physical activity/exercise program aimed toward attainment of established weight goal;Weight Loss: Understanding of general recommendations for a balanced deficit meal plan, which promotes 1-2 lb weight loss per week and includes a negative energy balance of [phone] kcal/d;Understanding recommendations for meals to include 15-35% energy as protein, 25-35% energy from fat, 35-60% energy from carbohydrates, less than 200mg  of dietary cholesterol, 20-35 gm of total fiber daily;Understanding of distribution of calorie intake throughout the day with the consumption of 4-5 meals/snacks     Heart Failure Yes    Intervention Provide a combined exercise and nutrition program that is supplemented with education, support and counseling about heart failure. Directed toward relieving symptoms such as shortness of breath, decreased exercise tolerance, and extremity edema.    Expected Outcomes Short term: Attendance in program 2-3 days a week with increased exercise capacity. Reported lower sodium intake. Reported increased fruit and vegetable intake. Reports medication compliance.;Short term: Daily weights obtained and reported for increase. Utilizing diuretic protocols set by physician.;Long term: Adoption of self-care skills and reduction of barriers for early signs and symptoms recognition and intervention leading to self-care maintenance.    Lipids Yes    Intervention Provide education and support for participant on nutrition & aerobic/resistive exercise along with prescribed medications to achieve LDL 70mg , HDL >40mg .    Expected Outcomes Short Term: Participant states understanding of desired cholesterol values and is compliant with medications prescribed. Participant is following exercise prescription and nutrition guidelines.;Long Term: Cholesterol controlled with medications as prescribed, with individualized exercise RX and with personalized nutrition plan. Value goals: LDL < 70mg , HDL > 40 mg.    Stress Yes    Intervention Offer individual and/or small group education and counseling on adjustment to heart disease, stress management and health-related lifestyle change. Teach and support self-help strategies.;Refer participants experiencing significant psychosocial distress to appropriate mental health specialists for further evaluation and treatment. When possible, include family members and significant others in education/counseling sessions.    Expected Outcomes Short Term: Participant demonstrates changes in health-related behavior, relaxation and other stress management skills, ability  to obtain effective social support, and compliance with psychotropic medications if prescribed.;Long Term: Emotional wellbeing is indicated by absence of clinically significant psychosocial distress or social isolation.           Core Components/Risk Factors/Patient Goals Review:   Goals and Risk Factor Review    Row Name 09/27/19 1444 10/10/19 1345 10/31/19 1150 11/14/19 1354       Core Components/Risk Factors/Patient Goals Review   Personal Goals Review Weight Management/Obesity;Heart  Failure;Lipids Weight Management/Obesity;Heart Failure;Lipids -- Weight Management/Obesity;Heart Failure;Lipids    Review Jonathon Cain has several CAD risk factors. He is eager to participate in CR for modification. His goals are to be able to walk to the mailbox and back, walk to neighbors home and take out trash with more stamina, strength, and less shortness of breath. He would like to be able to take nephew to [place] once his strength and endurance have improved. Jonathon Cain has several CAD risk factors. He is eager to participate in CR for modification. His goals are to be able to walk to the mailbox and back, walk to neighbors home and take out trash with more stamina, strength, and less shortness of breath. He would like to be able to take nephew to [place] once his strength and endurance have improved. Jonathon Cain has several CAD risk factors. He continues to be eager to participate in CR for modification. His goals are to be able to walk to the mailbox and back, walk to neighbors home and take out trash with more stamina, strength, and less shortness of breath. He would like to be able to take nephew to [place] once his strength and endurance have improved. Jonathon Cain continues to have multiple CAD risk factors with need for additional interventions including PCI 11/14/19. He will be readmitted to CR post PCI for risk factor modifications    Expected Outcomes Jonathon Cain will continue to participate in CR for risk factor  modification and for progression towards personal goals. Jonathon Cain will continue to participate in CR for risk factor modification and for progression towards personal goals. Jonathon Cain will continue to participate in CR for risk factor modification and for progression towards personal goals. Jonathon Cain will continue to participate in CR for risk factor modification and for progression towards personal goals once readmitted post PCI 11/14/19           Core Components/Risk Factors/Patient Goals at Discharge (Final Review):   Goals and Risk Factor Review - 11/14/19 1354      Core Components/Risk Factors/Patient Goals Review   Personal Goals Review Weight Management/Obesity;Heart Failure;Lipids    Review Jonathon Cain continues to have multiple CAD risk factors with need for additional interventions including PCI 11/14/19. He will be readmitted to CR post PCI for risk factor modifications    Expected Outcomes Mr. Mesa will continue to participate in CR for risk factor modification and for progression towards personal goals once readmitted post PCI 11/14/19           ITP Comments:  ITP Comments    Row Name 09/19/19 1341 09/27/19 1437 10/10/19 1338 10/31/19 1144 11/14/19 1339   ITP Comments Dr. Fransico Him Medical Director Cardiac Rehab [facility] Mr. Hippler completed his first cardiac rehab exercise today and tolerated well. Worked 30 min on NuStep at an RPE of 11. VSS. Patient did complain of left thigh and calf "burning" 4/10 which subsided post exercise during stretches and cool down. 30 day ITP review: Mr. Vandervelden has completed 6 cardiac rehab exercise sessions since admission. He has had perfect attendance and continues to be eager to participate in CR. He consistantly works to an RPE of 11-13 with average mets on the NuStep 1.9. He does complain of intermittant calf pain. He is restricted to seated exercise secondary to very poor vision and requires an assistive device for ambulation safety. VSS and within  parameters for exercise. Too soon to evaluate progression towards goals of increasing stamina and strength. Will re-evaluate in  30 days. 30 day ITP review: Mr. Sansoucie has completed 10 cardiac rehab exercise sessions since admission. He has had a few absences from CR secondary to diarrhea and was sent to the ED on his 9th session for asymptomatic bradycardia and hypotension (Dx hypovolemia secondary to diarrhea). He returned 10/30/19 and tolerated exercise well with BP and HR WNL. Stalin feels his stamina and strength are slowly improving. Will re-evaluate in 30 days. Mr. Bologna has completed 10 cardiac rehab exercise sessions since admission. He has had a few absences from CR secondary to diarrhea and was sent to the ED on his 9th session for asymptomatic bradycardia and hypotension (Dx hypovolemia secondary to diarrhea). He returned 10/30/19 and tolerated exercise well with BP and HR WNL. Follow up appointment with Cardiology resulted in scheduled PCP 11/14/19 with covid testing 11/10/19. Tylique was scheduled to graduate 11/17/19. It is decided that he will be discharged from CR and readmitted under new PCI referral.   Row Name 12/21/19 1554           ITP Comments Dr Fransico Him MD, Medical Director              Comments: Patient attended orientation on 12/21/2019 to review rules and guidelines for program.  Completed 5 out of the  6 minute walk test. Dominica Severin had to sit down to rest due to complaints of mild shortness of breath and fatigue. Intitial ITP, and exercise prescription. Initial blood pressure 101/67 Telemetry-Sinus brady, Sinus Rhythm rate 46-53 resting. Patient was given water as he complained of feeling a little lightheaded please see previous documentation.. Safety measures and social distancing in place per CDC guidelines. Dr Virgina Jock was notified about today's blood pressures and symptoms. Dr Virgina Jock gave parameter's for exercise.  Patient had no symptoms upon exit from phase 2 cardiac rehab. Bravlio  was wearing his life vest and uses a cane for stability. Jostin used a rollator during his walk test.Gargi Berch Venetia Maxon, RN,BSN 12/21/2019 4:17 PM

## 2019-12-25 ENCOUNTER — Encounter (HOSPITAL_COMMUNITY): Payer: Medicare HMO

## 2019-12-27 ENCOUNTER — Encounter (HOSPITAL_COMMUNITY): Payer: Medicare HMO

## 2019-12-27 ENCOUNTER — Telehealth (HOSPITAL_COMMUNITY): Payer: Self-pay | Admitting: Emergency Medicine

## 2019-12-27 NOTE — Telephone Encounter (Signed)
Reaching out to patient to offer assistance regarding upcoming cardiac imaging study; pt verbalizes understanding of appt date/time, parking situation and where to check in, pre-test NPO status and medications ordered, and verified current allergies; name and call back number provided for further questions should they arise Marchia Bond RN Navigator Cardiac Imaging [facility] [phone] office [phone] cell   Pt denies implants other than a coronary stent, wears lifevest but removable.  Pt states some claustro but managable.   Clarise Cruz

## 2019-12-28 ENCOUNTER — Ambulatory Visit (HOSPITAL_COMMUNITY)
Admission: RE | Admit: 2019-12-28 | Discharge: 2019-12-28 | Disposition: A | Discharge status: Home / Self Care | Payer: Medicare HMO | Source: Ambulatory Visit | Attending: Cardiology | Admitting: Cardiology

## 2019-12-28 ENCOUNTER — Other Ambulatory Visit: Payer: Self-pay

## 2019-12-28 DIAGNOSIS — I5020 Unspecified systolic (congestive) heart failure: Secondary | ICD-10-CM

## 2019-12-28 MED ORDER — GADOBUTROL 1 MMOL/ML IV SOLN
10.0000 mL | Freq: Once | INTRAVENOUS | Status: AC | PRN
  Administered 2019-12-28: 10 mL via INTRAVENOUS

## 2019-12-29 ENCOUNTER — Encounter (HOSPITAL_COMMUNITY): Payer: Medicare HMO

## 2019-12-29 NOTE — Progress Notes (Signed)
Reviewed and discussed the results with the patient. EF 36%. He does have subendocardial late gadolinium enhancement consistent with prior infarct in LAD territory. There is >50% transmural LGE suggesting nonviability in mid anterior/anteroseptal walls, apical anterior/septal/inferior walls, and apex. However, he has not had any WCT. No indication for ICD. Can stop wearing Lifevst.  Nigel Mormon, MD Pager: [phone] Office: [phone]

## 2020-01-01 ENCOUNTER — Other Ambulatory Visit: Payer: Self-pay

## 2020-01-01 ENCOUNTER — Encounter (HOSPITAL_COMMUNITY)
Admission: RE | Admit: 2020-01-01 | Discharge: 2020-01-01 | Disposition: A | Discharge status: Home / Self Care | Payer: Medicare HMO | Source: Ambulatory Visit | Attending: Cardiology | Admitting: Cardiology

## 2020-01-01 DIAGNOSIS — I2109 ST elevation (STEMI) myocardial infarction involving other coronary artery of anterior wall: Secondary | ICD-10-CM | POA: Insufficient documentation

## 2020-01-01 DIAGNOSIS — Z955 Presence of coronary angioplasty implant and graft: Secondary | ICD-10-CM | POA: Diagnosis present

## 2020-01-01 NOTE — Progress Notes (Signed)
Daily Session Note  Patient Details  Name: Jonathon Cain MRN: [id number] Date of Birth: 27-Jan-1961 Referring Provider:     [facility] from 12/21/2019 in [place]  Referring Provider Vernell Leep, MD      Encounter Date: 01/01/2020  Check In:  Session Check In - 01/01/20 1514      Check-In   Supervising physician immediately available to respond to emergencies [facility] immediately available    Physician(s) Dr. Rodena Piety    Location [facility]    Staff Present Barnet Pall, RN, Milus Glazier, MS, EP-C, CCRP;Tyara Nevels, MS,ACSM CEP, Exercise Physiologist;Olinty Celesta Aver, MS, ACSM CEP, Exercise Physiologist    Virtual Visit No    Medication changes reported     No    Fall or balance concerns reported    No    Tobacco Cessation No Change    Warm-up and Cool-down Performed on first and last piece of equipment    Resistance Training Performed Yes    VAD Patient? No    PAD/SET Patient? No      Pain Assessment   Currently in Pain? No/denies    Pain Score 0-No pain    Multiple Pain Sites No           Capillary Blood Glucose: No results found for this or any previous visit (from the past 24 hour(s)).   Exercise Prescription Changes - 01/01/20 1630      Response to Exercise   Blood Pressure (Admit) 96/60    Blood Pressure (Exercise) 98/60    Blood Pressure (Exit) 90/58    Heart Rate (Admit) 65 bpm    Heart Rate (Exercise) 71 bpm    Heart Rate (Exit) 56 bpm    Rating of Perceived Exertion (Exercise) 12    Symptoms None    Comments Pt's first day of exercise in the CRP2 program.     Duration Progress to 30 minutes of  aerobic without signs/symptoms of physical distress    Intensity THRR unchanged      Progression   Progression Continue to progress workloads to maintain intensity without signs/symptoms of physical distress.    Average METs 1.9      Resistance Training    Training Prescription Yes    Weight 3 lbs    Reps 10-15    Time 10 Minutes      NuStep   Level 2    SPM 75    Minutes 30    METs 1.9           Social History   Tobacco Use  Smoking Status Former Smoker  . Packs/day: 1.00  . Years: 10.00  . Pack years: 10.00  . Types: Cigarettes  . Quit date: 07/26/2019  . Years since quitting: 0.4  Smokeless Tobacco Never Used    Goals Met:  Exercise tolerated well No report of cardiac concerns or symptoms Strength training completed today  Goals Unmet:  Not Applicable  Comments: [name] started cardiac rehab today.  Pt tolerated light exercise without difficulty. Systolic blood pressure in the 90's to low 100's. , telemetry-Sinus Rhythm with a downward QRS, asymptomatic.  Medication list reconciled. Pt denies barriers to medicaiton compliance.  PSYCHOSOCIAL ASSESSMENT:  PHQ-0. Pt exhibits positive coping skills, hopeful outlook with supportive family. No psychosocial needs identified at this time, no psychosocial interventions necessary. .   Pt oriented to exercise equipment and routine.  [name]'s life vest has been discontinued per Dr  Patwardhan. Understanding verbalized.Barnet Pall, RN,BSN 01/02/2020 8:11 AM   Dr. Fransico Him is Medical Director for Cardiac Rehab at [facility].

## 2020-01-03 ENCOUNTER — Other Ambulatory Visit: Payer: Self-pay

## 2020-01-03 ENCOUNTER — Encounter (HOSPITAL_COMMUNITY)
Admission: RE | Admit: 2020-01-03 | Discharge: 2020-01-03 | Disposition: A | Discharge status: Home / Self Care | Payer: Medicare HMO | Source: Ambulatory Visit | Attending: Cardiology | Admitting: Cardiology

## 2020-01-03 DIAGNOSIS — I2109 ST elevation (STEMI) myocardial infarction involving other coronary artery of anterior wall: Secondary | ICD-10-CM | POA: Diagnosis not present

## 2020-01-03 DIAGNOSIS — Z955 Presence of coronary angioplasty implant and graft: Secondary | ICD-10-CM

## 2020-01-05 ENCOUNTER — Encounter (HOSPITAL_COMMUNITY): Payer: Medicare HMO

## 2020-01-06 ENCOUNTER — Other Ambulatory Visit: Payer: Self-pay | Admitting: Cardiology

## 2020-01-08 ENCOUNTER — Encounter (HOSPITAL_COMMUNITY)
Admission: RE | Admit: 2020-01-08 | Discharge: 2020-01-08 | Disposition: A | Discharge status: Home / Self Care | Payer: Medicare HMO | Source: Ambulatory Visit | Attending: Cardiology | Admitting: Cardiology

## 2020-01-08 ENCOUNTER — Other Ambulatory Visit: Payer: Self-pay

## 2020-01-08 DIAGNOSIS — Z955 Presence of coronary angioplasty implant and graft: Secondary | ICD-10-CM

## 2020-01-08 DIAGNOSIS — I2109 ST elevation (STEMI) myocardial infarction involving other coronary artery of anterior wall: Secondary | ICD-10-CM

## 2020-01-09 NOTE — Progress Notes (Signed)
Jonathon Cain 59 y.o. male Nutrition Note  Visit Diagnosis: Acute ST elevation myocardial infarction (STEMI) of anterolateral wall ([place]) 07/26/19  Status post coronary artery stent placement 07/26/19  Past Medical History:  Diagnosis Date  . Bladder outlet obstruction   . CHF (congestive heart failure) ([place])   . Coronary artery disease   . Hyperlipidemia   . Hypertension   . Retinitis pigmentosa      Medications reviewed.   Current Outpatient Medications:  .  acetaminophen (TYLENOL) 325 MG tablet, Take 2 tablets (650 mg total) by mouth every 4 (four) hours as needed for headache or mild pain., Disp:  , Rfl:  .  aspirin 81 MG chewable tablet, Chew 1 tablet (81 mg total) by mouth daily., Disp: 30 tablet, Rfl: 11 .  atorvastatin (LIPITOR) 80 MG tablet, Take 1 tablet (80 mg total) by mouth daily., Disp: 30 tablet, Rfl: 3 .  cetirizine (ZYRTEC) 10 MG tablet, Take 10 mg by mouth daily as needed for allergies., Disp: , Rfl:  .  ENTRESTO 24-26 MG, TAKE 1 TABLET BY MOUTH TWICE DAILY, Disp: 60 tablet, Rfl: 1 .  fluticasone (FLONASE) 50 MCG/ACT nasal spray, Place 2 sprays into both nostrils daily as needed for allergies or rhinitis. , Disp: , Rfl:  .  furosemide (LASIX) 20 MG tablet, Take 1 tablet (20 mg total) by mouth daily. (Patient taking differently: Take 20 mg by mouth daily as needed for fluid. ), Disp: 30 tablet, Rfl: 1 .  metoprolol succinate (TOPROL XL) 25 MG 24 hr tablet, Take 0.5 tablets (12.5 mg total) by mouth daily., Disp: 30 tablet, Rfl: 2 .  nitroGLYCERIN (NITROSTAT) 0.4 MG SL tablet, Place 1 tablet (0.4 mg total) under the tongue every 5 (five) minutes as needed for chest pain., Disp: 25 tablet, Rfl: 2 .  pantoprazole (PROTONIX) 40 MG tablet, TAKE 1 TABLET(40 MG) BY MOUTH DAILY, Disp: 30 tablet, Rfl: 2 .  spironolactone (ALDACTONE) 25 MG tablet, Take 1 tablet (25 mg total) by mouth daily., Disp: 90 tablet, Rfl: 3 .  tamsulosin (FLOMAX) 0.4 MG CAPS capsule, Take 1 capsule by  mouth daily as needed. (Patient not taking: Reported on 12/15/2019), Disp: , Rfl:  .  ticagrelor (BRILINTA) 90 MG TABS tablet, Take 1 tablet (90 mg total) by mouth 2 (two) times daily., Disp: 60 tablet, Rfl: 11 .  timolol (TIMOPTIC) 0.5 % ophthalmic solution, Place 1 drop into both eyes See admin instructions. Place 1 drop into both eyes two times a day for 2 months on/2 months off- alternate with Latanoprost (Patient not taking: Reported on 12/15/2019), Disp: , Rfl:    Ht Readings from Last 1 Encounters:  12/21/19 5\' 11"  (1.803 m)     Wt Readings from Last 3 Encounters:  12/21/19 272 lb 11.3 oz (123.7 kg)  12/06/19 275 lb (124.7 kg)  11/14/19 266 lb (120.7 kg)     There is no height or weight on file to calculate BMI.   Social History   Tobacco Use  Smoking Status Former Smoker  . Packs/day: 1.00  . Years: 10.00  . Pack years: 10.00  . Types: Cigarettes  . Quit date: 07/26/2019  . Years since quitting: 0.4  Smokeless Tobacco Never Used     Lab Results  Component Value Date   CHOL 230 (H) 07/26/2019   Lab Results  Component Value Date   HDL 36 (L) 07/26/2019   Lab Results  Component Value Date   LDLCALC 175 (H) 07/26/2019   Lab  Results  Component Value Date   TRIG 95 07/26/2019     Lab Results  Component Value Date   HGBA1C 5.5 07/26/2019     CBG (last 3)  No results for input(s): GLUCAP in the last 72 hours.   Nutrition Note  Spoke with pt. Nutrition Plan and Nutrition Survey goals reviewed with pt.  Pt wants to lose wt. He has decreased portions and feels this has lead to a 5 lbs weight loss since June.   Pt with dx of CHF. Per discussion, pt does use canned/convenience foods often. He has poor eye sight and does not cook. He tries to choose lower sodium items. Pt does not add salt to food. Pt does not eat out frequently.  Reviewed lipid panel with pt - LDL 175 mg/dl. We reviewed lower saturated fat foods and lower sodium foods and ways to incorporate more  fiber. He enjoys fruits but only eats it 1-2 times per week. He does like frozen veggies so we reviewed ways to incorporate more often.  Difficulty reading labels due to eyesight. He orders groceries online and tries to zoom in to check the sodium.  Pt expressed understanding of the information reviewed.    Nutrition Diagnosis ? Inappropriate intake of saturated fats related to food related knowledge deficit as evidenced by pt's diet recall and LDL value of 175 mg/dl  Nutrition Intervention ? Pt's individual nutrition plan reviewed with pt. ? Benefits of adopting Heart Healthy diet discussed when [employer] reviewed.   ? Continue client-centered nutrition education by RD, as part of interdisciplinary care.  Goal(s) ? Pt to identify food quantities necessary to achieve weight loss of 6-24 lb at graduation from cardiac rehab.  ? Pt to build a healthy plate including vegetables, fruits, whole grains, and low-fat dairy products in a heart healthy meal plan. ? Pt to increase fiber at meals by incorporating frozen veggies and fresh fruits  Plan:   Will provide client-centered nutrition education as part of interdisciplinary care  Monitor and evaluate progress toward nutrition goal with team.   Michaele Offer, MS, RDN, LDN

## 2020-01-10 ENCOUNTER — Other Ambulatory Visit: Payer: Self-pay

## 2020-01-10 ENCOUNTER — Encounter (HOSPITAL_COMMUNITY)
Admission: RE | Admit: 2020-01-10 | Discharge: 2020-01-10 | Disposition: A | Discharge status: Home / Self Care | Payer: Medicare HMO | Source: Ambulatory Visit | Attending: Cardiology | Admitting: Cardiology

## 2020-01-10 VITALS — Wt 267.6 lb

## 2020-01-10 DIAGNOSIS — I2109 ST elevation (STEMI) myocardial infarction involving other coronary artery of anterior wall: Secondary | ICD-10-CM | POA: Diagnosis not present

## 2020-01-10 DIAGNOSIS — Z955 Presence of coronary angioplasty implant and graft: Secondary | ICD-10-CM

## 2020-01-12 ENCOUNTER — Encounter (HOSPITAL_COMMUNITY)
Admission: RE | Admit: 2020-01-12 | Discharge: 2020-01-12 | Disposition: A | Discharge status: Home / Self Care | Payer: Medicare HMO | Source: Ambulatory Visit | Attending: Cardiology | Admitting: Cardiology

## 2020-01-12 ENCOUNTER — Other Ambulatory Visit: Payer: Self-pay

## 2020-01-12 DIAGNOSIS — I2109 ST elevation (STEMI) myocardial infarction involving other coronary artery of anterior wall: Secondary | ICD-10-CM | POA: Diagnosis not present

## 2020-01-12 DIAGNOSIS — Z955 Presence of coronary angioplasty implant and graft: Secondary | ICD-10-CM

## 2020-01-14 ENCOUNTER — Other Ambulatory Visit: Payer: Self-pay | Admitting: Cardiology

## 2020-01-15 ENCOUNTER — Encounter (HOSPITAL_COMMUNITY): Payer: Medicare HMO

## 2020-01-17 ENCOUNTER — Encounter (HOSPITAL_COMMUNITY): Payer: Medicare HMO

## 2020-01-18 ENCOUNTER — Encounter (HOSPITAL_COMMUNITY): Payer: Self-pay | Admitting: *Deleted

## 2020-01-18 DIAGNOSIS — Z955 Presence of coronary angioplasty implant and graft: Secondary | ICD-10-CM

## 2020-01-18 DIAGNOSIS — I2109 ST elevation (STEMI) myocardial infarction involving other coronary artery of anterior wall: Secondary | ICD-10-CM

## 2020-01-18 NOTE — Progress Notes (Signed)
Cardiac Individual Treatment Plan  Patient Details  Name: Jonathon Cain MRN: [id number] Date of Birth: 06-02-1960 Referring Provider:     [place] from 12/21/2019 in [facility]  Referring Provider Vernell Leep, MD      Initial Encounter Date:    CARDIAC REHAB PHASE II ORIENTATION from 12/21/2019 in [facility]  Date 12/21/19      Visit Diagnosis: Acute ST elevation myocardial infarction (STEMI) of anterolateral wall ([place]) 07/26/19  Status post coronary artery stent placement 07/26/19  Patient's Home Medications on Admission:  Current Outpatient Medications:  .  acetaminophen (TYLENOL) 325 MG tablet, Take 2 tablets (650 mg total) by mouth every 4 (four) hours as needed for headache or mild pain., Disp:  , Rfl:  .  aspirin 81 MG chewable tablet, Chew 1 tablet (81 mg total) by mouth daily., Disp: 30 tablet, Rfl: 11 .  atorvastatin (LIPITOR) 80 MG tablet, Take 1 tablet (80 mg total) by mouth daily., Disp: 30 tablet, Rfl: 3 .  cetirizine (ZYRTEC) 10 MG tablet, Take 10 mg by mouth daily as needed for allergies., Disp: , Rfl:  .  ENTRESTO 24-26 MG, TAKE 1 TABLET BY MOUTH TWICE DAILY, Disp: 60 tablet, Rfl: 1 .  fluticasone (FLONASE) 50 MCG/ACT nasal spray, Place 2 sprays into both nostrils daily as needed for allergies or rhinitis. , Disp: , Rfl:  .  furosemide (LASIX) 20 MG tablet, Take 1 tablet (20 mg total) by mouth daily. (Patient taking differently: Take 20 mg by mouth daily as needed for fluid. ), Disp: 30 tablet, Rfl: 1 .  metoprolol succinate (TOPROL-XL) 25 MG 24 hr tablet, TAKE 1/2 TABLET(12.5 MG) BY MOUTH DAILY, Disp: 30 tablet, Rfl: 2 .  nitroGLYCERIN (NITROSTAT) 0.4 MG SL tablet, Place 1 tablet (0.4 mg total) under the tongue every 5 (five) minutes as needed for chest pain., Disp: 25 tablet, Rfl: 2 .  pantoprazole (PROTONIX) 40 MG tablet, TAKE 1 TABLET(40 MG) BY MOUTH DAILY, Disp: 30 tablet,  Rfl: 2 .  spironolactone (ALDACTONE) 25 MG tablet, Take 1 tablet (25 mg total) by mouth daily., Disp: 90 tablet, Rfl: 3 .  tamsulosin (FLOMAX) 0.4 MG CAPS capsule, Take 1 capsule by mouth daily as needed. (Patient not taking: Reported on 12/15/2019), Disp: , Rfl:  .  ticagrelor (BRILINTA) 90 MG TABS tablet, Take 1 tablet (90 mg total) by mouth 2 (two) times daily., Disp: 60 tablet, Rfl: 11 .  timolol (TIMOPTIC) 0.5 % ophthalmic solution, Place 1 drop into both eyes See admin instructions. Place 1 drop into both eyes two times a day for 2 months on/2 months off- alternate with Latanoprost (Patient not taking: Reported on 12/15/2019), Disp: , Rfl:   Past Medical History: Past Medical History:  Diagnosis Date  . Bladder outlet obstruction   . CHF (congestive heart failure) ([place])   . Coronary artery disease   . Hyperlipidemia   . Hypertension   . Retinitis pigmentosa     Tobacco Use: Social History   Tobacco Use  Smoking Status Former Smoker  . Packs/day: 1.00  . Years: 10.00  . Pack years: 10.00  . Types: Cigarettes  . Quit date: 07/26/2019  . Years since quitting: 0.4  Smokeless Tobacco Never Used    Labs: Recent Review Scientist, physiological    Labs for ITP Cardiac and Pulmonary Rehab Latest Ref Rng & Units 07/26/2019 07/26/2019 07/26/2019   Cholestrol 0 - 200 mg/dL 230(H) - -  LDLCALC 0 - 99 mg/dL 175(H) - -   HDL >40 mg/dL 36(L) - -   Trlycerides <150 mg/dL 95 - -   Hemoglobin A1c 4.8 - 5.6 % 5.5 - -   PHART 7.35 - 7.45 - - 7.436   PCO2ART 32 - 48 mmHg - - 38.0   HCO3 20.0 - 28.0 mmol/L - - 25.6   TCO2 22 - 32 mmol/L - 28 27   O2SAT % - - 99.0      Capillary Blood Glucose: Lab Results  Component Value Date   GLUCAP 91 10/25/2019     Exercise Target Goals: Exercise Program Goal: Individual exercise prescription set using results from initial 6 min walk test and THRR while considering  patient's activity barriers and safety.   Exercise Prescription Goal: Starting with aerobic  activity 30 plus minutes a day, 3 days per week for initial exercise prescription. Provide home exercise prescription and guidelines that participant acknowledges understanding prior to discharge.  Activity Barriers & Risk Stratification:  Activity Barriers & Cardiac Risk Stratification - 12/21/19 1521      Activity Barriers & Cardiac Risk Stratification   Activity Barriers Deconditioning;Decreased Ventricular Function;Balance Concerns;Assistive Device;Other (comment)    Comments Legally blind, has loss of perpherial vision. Uses cane    Cardiac Risk Stratification High           6 Minute Walk:  6 Minute Walk    Row Name 12/21/19 1518         6 Minute Walk   Phase Initial     Distance 800 feet     Walk Time 6 minutes     # of Rest Breaks 1     MPH 1.52     METS 1.51     RPE 14     Perceived Dyspnea  1     VO2 Peak 5.28     Symptoms Yes (comment)     Comments SOB, Fatigue, RPD = 1     Resting HR 62 bpm     Resting BP 101/67     Resting Oxygen Saturation  95 %     Exercise Oxygen Saturation  during 6 min walk 97 %     Max Ex. HR 89 bpm     Max Ex. BP 88/60     2 Minute Post BP 90/60            Oxygen Initial Assessment:   Oxygen Re-Evaluation:   Oxygen Discharge (Final Oxygen Re-Evaluation):   Initial Exercise Prescription:  Initial Exercise Prescription - 12/21/19 1500      Date of Initial Exercise RX and Referring Provider   Date 12/21/19    Referring Provider Vernell Leep, MD    Expected Discharge Date 02/14/20      NuStep   Level 2    SPM 75    Minutes 30    METs 1.7      Prescription Details   Frequency (times per week) 3      Intensity   THRR 40-80% of Max Heartrate 64-127    Ratings of Perceived Exertion 11-13    Perceived Dyspnea 0-4      Resistance Training   Training Prescription Yes    Weight 3 lbs    Reps 10-15           Perform Capillary Blood Glucose checks as needed.  Exercise Prescription Changes:  Exercise  Prescription Changes    Row Name 01/01/20 1630 01/12/20 1630  Response to Exercise   Blood Pressure (Admit) 96/60 103/66      Blood Pressure (Exercise) 98/60 110/73      Blood Pressure (Exit) 90/58 104/67      Heart Rate (Admit) 65 bpm 56 bpm      Heart Rate (Exercise) 71 bpm 77 bpm      Heart Rate (Exit) 56 bpm 48 bpm      Rating of Perceived Exertion (Exercise) 12 9      Symptoms None None      Comments Pt's first day of exercise in the CRP2 program.  Reviewed METs      Duration Progress to 30 minutes of  aerobic without signs/symptoms of physical distress Progress to 30 minutes of  aerobic without signs/symptoms of physical distress      Intensity THRR unchanged THRR unchanged        Progression   Progression Continue to progress workloads to maintain intensity without signs/symptoms of physical distress. Continue to progress workloads to maintain intensity without signs/symptoms of physical distress.      Average METs 1.9 2        Resistance Training   Training Prescription Yes Yes      Weight 3 lbs 3 lbs      Reps 10-15 10-15      Time 10 Minutes 10 Minutes        NuStep   Level 2 3      SPM 75 75      Minutes 30 30      METs 1.9 2             Exercise Comments:  Exercise Comments    Row Name 01/01/20 1633 01/12/20 1620         Exercise Comments Pt's first day of exercise in the CRP2 program. Pt tolerated session well with no complaints. Reviewed METs. Pt is progressing slowly.             Exercise Goals and Review:  Exercise Goals    Row Name 12/21/19 1522             Exercise Goals   Increase Physical Activity Yes       Intervention Provide advice, education, support and counseling about physical activity/exercise needs.;Develop an individualized exercise prescription for aerobic and resistive training based on initial evaluation findings, risk stratification, comorbidities and participant's personal goals.       Expected Outcomes Short  Term: Attend rehab on a regular basis to increase amount of physical activity.;Long Term: Add in home exercise to make exercise part of routine and to increase amount of physical activity.;Long Term: Exercising regularly at least 3-5 days a week.       Increase Strength and Stamina Yes       Intervention Provide advice, education, support and counseling about physical activity/exercise needs.;Develop an individualized exercise prescription for aerobic and resistive training based on initial evaluation findings, risk stratification, comorbidities and participant's personal goals.       Expected Outcomes Short Term: Increase workloads from initial exercise prescription for resistance, speed, and METs.;Short Term: Perform resistance training exercises routinely during rehab and add in resistance training at home;Long Term: Improve cardiorespiratory fitness, muscular endurance and strength as measured by increased METs and functional capacity (6MWT)       Able to understand and use rate of perceived exertion (RPE) scale Yes       Intervention Provide education and explanation on how to use RPE scale  Expected Outcomes Short Term: Able to use RPE daily in rehab to express subjective intensity level;Long Term:  Able to use RPE to guide intensity level when exercising independently       Knowledge and understanding of Target Heart Rate Range (THRR) Yes       Expected Outcomes Short Term: Able to use daily as guideline for intensity in rehab;Long Term: Able to use THRR to govern intensity when exercising independently;Short Term: Able to state/look up THRR       Able to check pulse independently Yes       Intervention Provide education and demonstration on how to check pulse in carotid and radial arteries.;Review the importance of being able to check your own pulse for safety during independent exercise       Expected Outcomes Short Term: Able to explain why pulse checking is important during independent  exercise;Long Term: Able to check pulse independently and accurately       Understanding of Exercise Prescription Yes       Intervention Provide education, explanation, and written materials on patient's individual exercise prescription       Expected Outcomes Short Term: Able to explain program exercise prescription;Long Term: Able to explain home exercise prescription to exercise independently              Exercise Goals Re-Evaluation :  Exercise Goals Re-Evaluation    Row Name 01/01/20 1630             Exercise Goal Re-Evaluation   Exercise Goals Review Increase Physical Activity;Increase Strength and Stamina;Able to understand and use rate of perceived exertion (RPE) scale;Knowledge and understanding of Target Heart Rate Range (THRR);Able to check pulse independently;Understanding of Exercise Prescription       Comments Pt's first day of exercise in the CRP2 program. Pt tolerated session well with nco complaints. Pt understands the exercise Rx, THRR, and RPE scale.       Expected Outcomes Will continue to monitor and progress patient as tolerated.               Discharge Exercise Prescription (Final Exercise Prescription Changes):  Exercise Prescription Changes - 01/12/20 1630      Response to Exercise   Blood Pressure (Admit) 103/66    Blood Pressure (Exercise) 110/73    Blood Pressure (Exit) 104/67    Heart Rate (Admit) 56 bpm    Heart Rate (Exercise) 77 bpm    Heart Rate (Exit) 48 bpm    Rating of Perceived Exertion (Exercise) 9    Symptoms None    Comments Reviewed METs    Duration Progress to 30 minutes of  aerobic without signs/symptoms of physical distress    Intensity THRR unchanged      Progression   Progression Continue to progress workloads to maintain intensity without signs/symptoms of physical distress.    Average METs 2      Resistance Training   Training Prescription Yes    Weight 3 lbs    Reps 10-15    Time 10 Minutes      NuStep   Level 3      SPM 75    Minutes 30    METs 2           Nutrition:  Target Goals: Understanding of nutrition guidelines, daily intake of sodium 1500mg , cholesterol 200mg , calories 30% from fat and 7% or less from saturated fats, daily to have 5 or more servings of fruits and vegetables.  Biometrics:  Pre Biometrics -  12/21/19 1400      Pre Biometrics   Waist Circumference 52 inches    Hip Circumference 49 inches    Waist to Hip Ratio 1.06 %    Triceps Skinfold 33 mm    % Body Fat 39.7 %    Grip Strength 39 kg    Flexibility --   Not done to h/o back surgery   Single Leg Stand --   Not dome: Fall risk, uses cane, visually impaired           Nutrition Therapy Plan and Nutrition Goals:  Nutrition Therapy & Goals - 01/09/20 0653      Nutrition Therapy   Diet Heart healthy    Drug/Food Interactions Statins/Certain Fruits      Personal Nutrition Goals   Nutrition Goal Pt to build a healthy plate including vegetables, fruits, whole grains, and low-fat dairy products in a heart healthy meal plan.    Personal Goal #2 Pt to identify food quantities necessary to achieve weight loss of 6-24 lb at graduation from cardiac rehab. Pt to weigh and measure serving sizes      Intervention Plan   Intervention Prescribe, educate and counsel regarding individualized specific dietary modifications aiming towards targeted core components such as weight, hypertension, lipid management, diabetes, heart failure and other comorbidities.    Expected Outcomes Short Term Goal: Understand basic principles of dietary content, such as calories, fat, sodium, cholesterol and nutrients.           Nutrition Assessments:  Nutrition Assessments - 01/09/20 0653      MEDFICTS Scores   Pre Score 72           Nutrition Goals Re-Evaluation:  Nutrition Goals Re-Evaluation    [place] Name 01/09/20 0653 01/17/20 1327           Goals   Current Weight 272 lb (123.4 kg) 267 lb 10.2 oz (121.4 kg)      Nutrition  Goal -- Pt to build a healthy plate including vegetables, fruits, whole grains, and low-fat dairy products in a heart healthy meal plan.        Personal Goal #2 Re-Evaluation   Personal Goal #2 -- Pt to identify food quantities necessary to achieve weight loss of 6-24 lb at graduation from cardiac rehab. Pt to weigh and measure serving sizes             Nutrition Goals Discharge (Final Nutrition Goals Re-Evaluation):  Nutrition Goals Re-Evaluation - 01/17/20 1327      Goals   Current Weight 267 lb 10.2 oz (121.4 kg)    Nutrition Goal Pt to build a healthy plate including vegetables, fruits, whole grains, and low-fat dairy products in a heart healthy meal plan.      Personal Goal #2 Re-Evaluation   Personal Goal #2 Pt to identify food quantities necessary to achieve weight loss of 6-24 lb at graduation from cardiac rehab. Pt to weigh and measure serving sizes           Psychosocial: Target Goals: Acknowledge presence or absence of significant depression and/or stress, maximize coping skills, provide positive support system. Participant is able to verbalize types and ability to use techniques and skills needed for reducing stress and depression.  Initial Review & Psychosocial Screening:  Initial Psych Review & Screening - 12/21/19 1556      Initial Review   Current issues with Current Depression;History of Depression      Family Dynamics   Good Support System? Yes  Jonathon Cain lives alone. Jonathon Cain has support of his mother, brother and uncle   Comments Patient is visually impaired denies being depressed currently      Barriers   Psychosocial barriers to participate in program The patient should benefit from training in stress management and relaxation.      Screening Interventions   Interventions Encouraged to exercise;To provide support and resources with identified psychosocial needs;Provide feedback about the scores to participant    Expected Outcomes Short Term goal: Utilizing  psychosocial counselor, staff and physician to assist with identification of specific Stressors or current issues interfering with healing process. Setting desired goal for each stressor or current issue identified.;Long Term Goal: Stressors or current issues are controlled or eliminated.;Short Term goal: Identification and review with participant of any Quality of Life or Depression concerns found by scoring the questionnaire.;Long Term goal: The participant improves quality of Life and PHQ9 Scores as seen by post scores and/or verbalization of changes           Quality of Life Scores:  Quality of Life - 12/21/19 1509      Quality of Life   Select Quality of Life      Quality of Life Scores   Health/Function Pre 17.14 %    Socioeconomic Pre 23 %    Psych/Spiritual Pre 17.14 %    Family Pre 18 %    GLOBAL Pre 18.4 %          Scores of 19 and below usually indicate a poorer quality of life in these areas.  A difference of  2-3 points is a clinically meaningful difference.  A difference of 2-3 points in the total score of the Quality of Life Index has been associated with significant improvement in overall quality of life, self-image, physical symptoms, and general health in studies assessing change in quality of life.  PHQ-9: Recent Review Flowsheet Data    Depression screen [facility] 2/9 12/21/2019 09/19/2019   Decreased Interest 0 3   Down, Depressed, Hopeless 0 0   PHQ - 2 Score 0 3   Altered sleeping - 0   Tired, decreased energy - 3   Change in appetite - 3   Feeling bad or failure about yourself  - 0   Trouble concentrating - 0   Moving slowly or fidgety/restless - 0   Suicidal thoughts - 0   PHQ-9 Score - 9   Difficult doing work/chores - Not difficult at all     Interpretation of Total Score  Total Score Depression Severity:  1-4 = Minimal depression, 5-9 = Mild depression, 10-14 = Moderate depression, 15-19 = Moderately severe depression, 20-27 = Severe depression    Psychosocial Evaluation and Intervention:   Psychosocial Re-Evaluation:  Psychosocial Re-Evaluation    Row Name 01/18/20 1659             Psychosocial Re-Evaluation   Current issues with Current Depression;History of Depression       Comments Jonathon Cain has not voiced any increased depression or stressors       Expected Outcomes Will continue to monitor and offer support as needed       Interventions Encouraged to attend Cardiac Rehabilitation for the exercise       Continue Psychosocial Services  No Follow up required              Psychosocial Discharge (Final Psychosocial Re-Evaluation):  Psychosocial Re-Evaluation - 01/18/20 1659      Psychosocial Re-Evaluation   Current issues with Current  Depression;History of Depression    Comments Jonathon Cain has not voiced any increased depression or stressors    Expected Outcomes Will continue to monitor and offer support as needed    Interventions Encouraged to attend Cardiac Rehabilitation for the exercise    Continue Psychosocial Services  No Follow up required           Vocational Rehabilitation: Provide vocational rehab assistance to qualifying candidates.   Vocational Rehab Evaluation & Intervention:  Vocational Rehab - 12/21/19 1559      Initial Vocational Rehab Evaluation & Intervention   Assessment shows need for Vocational Rehabilitation No   Jonathon Cain is disabled and does not need vocational rehab at this time          Education: Education Goals: Education classes will be provided on a weekly basis, covering required topics. Participant will state understanding/return demonstration of topics presented.  Learning Barriers/Preferences:  Learning Barriers/Preferences - 12/21/19 1517      Learning Barriers/Preferences   Learning Barriers Sight    Learning Preferences Verbal Instruction;Audio           Education Topics: Hypertension, Hypertension Reduction -Define heart disease and high blood pressure. Discus how high  blood pressure affects the body and ways to reduce high blood pressure.   Exercise and Your Heart -Discuss why it is important to exercise, the FITT principles of exercise, normal and abnormal responses to exercise, and how to exercise safely.   Angina -Discuss definition of angina, causes of angina, treatment of angina, and how to decrease risk of having angina.   Cardiac Medications -Review what the following cardiac medications are used for, how they affect the body, and side effects that may occur when taking the medications.  Medications include Aspirin, Beta blockers, calcium channel blockers, ACE Inhibitors, angiotensin receptor blockers, diuretics, digoxin, and antihyperlipidemics.   Congestive Heart Failure -Discuss the definition of CHF, how to live with CHF, the signs and symptoms of CHF, and how keep track of weight and sodium intake.   Heart Disease and Intimacy -Discus the effect sexual activity has on the heart, how changes occur during intimacy as we age, and safety during sexual activity.   Smoking Cessation / COPD -Discuss different methods to quit smoking, the health benefits of quitting smoking, and the definition of COPD.   Nutrition I: Fats -Discuss the types of cholesterol, what cholesterol does to the heart, and how cholesterol levels can be controlled.   Nutrition II: Labels -Discuss the different components of food labels and how to read food label   Heart Parts/Heart Disease and PAD -Discuss the anatomy of the heart, the pathway of blood circulation through the heart, and these are affected by heart disease.   Stress I: Signs and Symptoms -Discuss the causes of stress, how stress may lead to anxiety and depression, and ways to limit stress.   Stress II: Relaxation -Discuss different types of relaxation techniques to limit stress.   Warning Signs of Stroke / TIA -Discuss definition of a stroke, what the signs and symptoms are of a stroke, and  how to identify when someone is having stroke.   Knowledge Questionnaire Score:  Knowledge Questionnaire Score - 12/21/19 1516      Knowledge Questionnaire Score   Pre Score 23/24           Core Components/Risk Factors/Patient Goals at Admission:  Personal Goals and Risk Factors at Admission - 12/21/19 1600      Core Components/Risk Factors/Patient Goals on Admission  Weight Management Yes;Weight Loss    Intervention Weight Management: Develop a combined nutrition and exercise program designed to reach desired caloric intake, while maintaining appropriate intake of nutrient and fiber, sodium and fats, and appropriate energy expenditure required for the weight goal.;Weight Management: Provide education and appropriate resources to help participant work on and attain dietary goals.;Weight Management/Obesity: Establish reasonable short term and long term weight goals.;Obesity: Provide education and appropriate resources to help participant work on and attain dietary goals.    Admit Weight 272 lb 11.3 oz (123.7 kg)    Expected Outcomes Short Term: Continue to assess and modify interventions until short term weight is achieved;Long Term: Adherence to nutrition and physical activity/exercise program aimed toward attainment of established weight goal;Weight Loss: Understanding of general recommendations for a balanced deficit meal plan, which promotes 1-2 lb weight loss per week and includes a negative energy balance of [id number] kcal/d;Understanding recommendations for meals to include 15-35% energy as protein, 25-35% energy from fat, 35-60% energy from carbohydrates, less than 200mg  of dietary cholesterol, 20-35 gm of total fiber daily;Understanding of distribution of calorie intake throughout the day with the consumption of 4-5 meals/snacks    Heart Failure Yes    Intervention Provide a combined exercise and nutrition program that is supplemented with education, support and counseling about heart  failure. Directed toward relieving symptoms such as shortness of breath, decreased exercise tolerance, and extremity edema.    Expected Outcomes Short term: Attendance in program 2-3 days a week with increased exercise capacity. Reported lower sodium intake. Reported increased fruit and vegetable intake. Reports medication compliance.;Short term: Daily weights obtained and reported for increase. Utilizing diuretic protocols set by physician.;Long term: Adoption of self-care skills and reduction of barriers for early signs and symptoms recognition and intervention leading to self-care maintenance.    Lipids Yes    Intervention Provide education and support for participant on nutrition & aerobic/resistive exercise along with prescribed medications to achieve LDL 70mg , HDL >40mg .    Expected Outcomes Short Term: Participant states understanding of desired cholesterol values and is compliant with medications prescribed. Participant is following exercise prescription and nutrition guidelines.;Long Term: Cholesterol controlled with medications as prescribed, with individualized exercise RX and with personalized nutrition plan. Value goals: LDL < 70mg , HDL > 40 mg.    Stress Yes    Intervention Offer individual and/or small group education and counseling on adjustment to heart disease, stress management and health-related lifestyle change. Teach and support self-help strategies.;Refer participants experiencing significant psychosocial distress to appropriate mental health specialists for further evaluation and treatment. When possible, include family members and significant others in education/counseling sessions.    Expected Outcomes Short Term: Participant demonstrates changes in health-related behavior, relaxation and other stress management skills, ability to obtain effective social support, and compliance with psychotropic medications if prescribed.;Long Term: Emotional wellbeing is indicated by absence of  clinically significant psychosocial distress or social isolation.           Core Components/Risk Factors/Patient Goals Review:   Goals and Risk Factor Review    Row Name 01/18/20 1700             Core Components/Risk Factors/Patient Goals Review   Personal Goals Review Weight Management/Obesity;Heart Failure;Lipids       Review Jonathon Cain has done well with exercise at cardiac rehab when inattendance       Expected Outcomes Jonathon Cain will continue to participate in phase 2 cardiac rehab for exercise nutrtion and life style modification  Core Components/Risk Factors/Patient Goals at Discharge (Final Review):   Goals and Risk Factor Review - 01/18/20 1700      Core Components/Risk Factors/Patient Goals Review   Personal Goals Review Weight Management/Obesity;Heart Failure;Lipids    Review Jonathon Cain has done well with exercise at cardiac rehab when inattendance    Expected Outcomes Jonathon Cain will continue to participate in phase 2 cardiac rehab for exercise nutrtion and life style modification           ITP Comments:  ITP Comments    Row Name 12/21/19 1554 01/18/20 1658         ITP Comments Dr Fransico Him MD, Medical Director 30 Day ITP Review. Darious has good participation in phase 2 cardiac rehab when in attendance             Comments: See ITP comments.Barnet Pall, RN,BSN 01/18/2020 5:06 PM

## 2020-01-19 ENCOUNTER — Encounter (HOSPITAL_COMMUNITY): Payer: Medicare HMO

## 2020-01-22 ENCOUNTER — Encounter (HOSPITAL_COMMUNITY)
Admission: RE | Admit: 2020-01-22 | Discharge: 2020-01-22 | Disposition: A | Discharge status: Home / Self Care | Payer: Medicare HMO | Source: Ambulatory Visit | Attending: Cardiology | Admitting: Cardiology

## 2020-01-22 ENCOUNTER — Other Ambulatory Visit: Payer: Self-pay

## 2020-01-22 DIAGNOSIS — Z955 Presence of coronary angioplasty implant and graft: Secondary | ICD-10-CM | POA: Diagnosis present

## 2020-01-22 DIAGNOSIS — I2109 ST elevation (STEMI) myocardial infarction involving other coronary artery of anterior wall: Secondary | ICD-10-CM | POA: Diagnosis present

## 2020-01-24 ENCOUNTER — Encounter (HOSPITAL_COMMUNITY): Payer: Medicare HMO

## 2020-01-24 ENCOUNTER — Other Ambulatory Visit: Payer: Self-pay | Admitting: Cardiology

## 2020-01-24 DIAGNOSIS — I25118 Atherosclerotic heart disease of native coronary artery with other forms of angina pectoris: Secondary | ICD-10-CM

## 2020-01-26 ENCOUNTER — Encounter (HOSPITAL_COMMUNITY): Payer: Medicare HMO

## 2020-01-29 ENCOUNTER — Encounter (HOSPITAL_COMMUNITY): Payer: Medicare HMO

## 2020-01-30 ENCOUNTER — Telehealth (HOSPITAL_COMMUNITY): Payer: Self-pay | Admitting: *Deleted

## 2020-01-30 NOTE — Telephone Encounter (Signed)
Called to check on patient as he has not been to exercise since November 1st.Jonathon Cain says that he did not have water for two days and that he has had bronchitis. [name] to follow up with his primary care physician and to hold off on returning to exercise until he is evaluated. Patient states understanding.Barnet Pall, RN,BSN 01/30/2020 8:30 AM

## 2020-01-31 ENCOUNTER — Ambulatory Visit: Payer: Medicare HMO

## 2020-01-31 ENCOUNTER — Encounter (HOSPITAL_COMMUNITY): Payer: Medicare HMO

## 2020-01-31 ENCOUNTER — Telehealth (HOSPITAL_COMMUNITY): Payer: Self-pay | Admitting: *Deleted

## 2020-01-31 NOTE — Telephone Encounter (Signed)
Spoke with Jonathon Cain. Jonathon Cain called his primary care physician about his symptoms. Jonathon Cain has been tested for COVID 19 and will not attend exercise at cardiac rehab today.Barnet Pall, RN,BSN 01/31/2020 2:51 PM

## 2020-02-01 ENCOUNTER — Ambulatory Visit: Payer: Medicare HMO | Admitting: Cardiology

## 2020-02-01 ENCOUNTER — Other Ambulatory Visit: Payer: Self-pay

## 2020-02-01 DIAGNOSIS — R079 Chest pain, unspecified: Secondary | ICD-10-CM

## 2020-02-01 DIAGNOSIS — I2510 Atherosclerotic heart disease of native coronary artery without angina pectoris: Secondary | ICD-10-CM

## 2020-02-01 MED ORDER — ATORVASTATIN CALCIUM 80 MG PO TABS
80.0000 mg | ORAL_TABLET | Freq: Every day | ORAL | 3 refills | Status: DC
Start: 2020-02-01 — End: 2020-05-28

## 2020-02-01 MED ORDER — FUROSEMIDE 20 MG PO TABS
20.0000 mg | ORAL_TABLET | Freq: Every day | ORAL | 1 refills | Status: DC
Start: 2020-02-01 — End: 2020-02-27

## 2020-02-01 NOTE — Progress Notes (Signed)
ICD-10-CM   1. Chest pain of uncertain etiology  Q72.2 EKG 12-Lead  2. Coronary artery disease involving native coronary artery of native heart without angina pectoris  I25.10 EKG 12-Lead    EKG 02/01/2020: Junctional escape rhythm with retrograde P wave, 2 beats.  Possibly sinus versus ectopic atrial rhythm rest of the beats.  Single PAC.  Left axis deviation, left anterior fascicular block.  IVCD, borderline criteria for LVH.  Poor R wave progression, cannot exclude anterolateral infarct old.  Nonspecific T abnormality.  Normal QT interval.   Adrian Prows, MD, [facility] 02/01/2020, 9:42 PM Office: [phone] Pager: [phone]

## 2020-02-02 ENCOUNTER — Ambulatory Visit: Payer: Medicare HMO | Admitting: Cardiology

## 2020-02-02 ENCOUNTER — Encounter: Payer: Self-pay | Admitting: Cardiology

## 2020-02-02 ENCOUNTER — Encounter (HOSPITAL_COMMUNITY): Payer: Medicare HMO

## 2020-02-02 VITALS — BP 103/58 | HR 46 | Resp 16 | Ht 71.0 in | Wt 268.0 lb

## 2020-02-02 DIAGNOSIS — I5020 Unspecified systolic (congestive) heart failure: Secondary | ICD-10-CM

## 2020-02-02 DIAGNOSIS — I2510 Atherosclerotic heart disease of native coronary artery without angina pectoris: Secondary | ICD-10-CM

## 2020-02-02 DIAGNOSIS — I739 Peripheral vascular disease, unspecified: Secondary | ICD-10-CM

## 2020-02-02 DIAGNOSIS — Z006 Encounter for examination for normal comparison and control in clinical research program: Secondary | ICD-10-CM

## 2020-02-02 NOTE — Progress Notes (Signed)
Patient referred by Lindie Spruce, MD for CAD  Subjective:   Jonathon Cain, male    DOB: 1960/11/16, 59 y.o.   MRN: [id number]   Chief Complaint  Patient presents with   HFrEF (heart failure with reduced ejection fraction) ([place])   Follow-up     HPI  59 y.o. 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 He has occasional dizziness, but denies pre-syncope or syncope.   On a separate note, he does endorse bilateral calf claudication, not lifestyle limiting.   He is enrolled in EMPACT-MI clinical trial.    Current Outpatient Medications on File Prior to Visit  Medication Sig Dispense Refill   acetaminophen (TYLENOL) 325 MG tablet Take 2 tablets (650 mg total) by mouth every 4 (four) hours as needed for headache or mild pain.     aspirin 81 MG chewable tablet Chew 1 tablet (81 mg total) by mouth daily. 30 tablet 11   atorvastatin (LIPITOR) 80 MG tablet Take 1 tablet (80 mg total) by mouth daily. 30 tablet 3   cetirizine (ZYRTEC) 10 MG tablet Take 10 mg by mouth daily as needed for allergies.     ENTRESTO 24-26 MG TAKE 1 TABLET BY MOUTH TWICE DAILY 60 tablet 1   fluticasone (FLONASE) 50 MCG/ACT nasal spray Place 2 sprays into both nostrils daily as needed for allergies or rhinitis.      furosemide (LASIX) 20 MG tablet Take 1 tablet (20 mg total) by mouth daily. 30 tablet 1   metoprolol succinate (TOPROL-XL) 25 MG 24 hr tablet TAKE 1/2 TABLET(12.5 MG) BY MOUTH DAILY 30 tablet 2   nitroGLYCERIN (NITROSTAT) 0.4 MG SL tablet Place 1 tablet (0.4 mg total) under the tongue every 5 (five) minutes as needed for chest pain. 25 tablet 2   pantoprazole  (PROTONIX) 40 MG tablet TAKE 1 TABLET(40 MG) BY MOUTH DAILY 30 tablet 2   spironolactone (ALDACTONE) 25 MG tablet Take 1 tablet (25 mg total) by mouth daily. 90 tablet 3   tamsulosin (FLOMAX) 0.4 MG CAPS capsule Take 1 capsule by mouth daily as needed. (Patient not taking: Reported on 12/15/2019)     ticagrelor (BRILINTA) 90 MG TABS tablet Take 1 tablet (90 mg total) by mouth 2 (two) times daily. 60 tablet 11   timolol (TIMOPTIC) 0.5 % ophthalmic solution Place 1 drop into both eyes See admin instructions. Place 1 drop into both eyes two times a day for 2 months on/2 months off- alternate with Latanoprost (Patient not taking: Reported on 12/15/2019)     No current facility-administered medications on file prior to visit.    Cardiovascular and other pertinent studies:  EKG 02/02/2020: Junctional rhythm/einus bradycardia 42 bpm Left anterolateral infarct Left anterior fascicular block No ischemic  Cardiac MRI 02/01/2020: 1. Subendocardial late gadolinium enhancement consistent with prior infarct in LAD territory. There is >50% transmural LGE suggesting nonviability in mid anterior/anteroseptal walls, apical anterior/septal/inferior walls, and apex. 2. Mild LV dilatation with moderate systolic function (EF 80%). Akinesis of mid to apical anterior, mid anteroseptal, apical septal, apical inferior, and apex. 3.  Normal RV size and systolic function (EF 99%) 4.  Moderate mitral regurgitation (regurgitant fraction 24%) 5.  Moderate right pleural effusion  Coronary angiography  11/14/2019: LM: Diffuse 30% disease. IVUS MLA 7.3 mm2,. dFR 1.0 LAD: Patent prox LAD stent with udner expansion        Optimization with 3.5X15 mm [place] balloon at 22 atm        Excellent expansion as seen on IVUS        TIMI flow II-->III        Diag: Mod diffuse disease LCx: 80% ostial OM1 stenosis. dFR 0.98 RCA: Not engaged today  Echocardiogram 10/24/2019:  Left ventricle cavity is mildly dilated. Distal  anteroseptal and apical  akinesis. LV endocardial border not well visualized to accurately asses  EF, probably around 30%. Recommend alternate imaging modality to  accurately assess EF.  Doppler evidence of grade III (restrictive)  diastolic dysfunction.  Left atrial cavity is severely dilated.  Moderate (Grade III) mitral regurgitation.  Inadequate TR jet to estimate PASP. Estimated RA pressure 8 mmHg.  No significant change compared to previous study on 07/27/2019.   Left Heart Catheterization 07/26/19 (Dr. Jacinto Halim):  LM: Mild diffuse disease LAD: Very large caliber vessel, occluded at the proximal end.  Gives origin to high D1 which is large.  Successful PTCA and stenting of the proximal LAD overlapping D1, 3.5 x 24 mm Synergy DES deployed at 12 atmospheric pressure for 60 S. 100% to 0% with TIMI 0 to TIMI-3 flow. LCx: Large vessel, giving origin to a very large OM1 which is secondary branches.  Ostium of OM1 has 80% stenosis. RCA: Mild diffuse disease, distal RCA 15 to 20% stenosis, PDA 50% stenosis.  Recommendation: Patient will need aggressive risk factor modification, dual antiplatelet therapy with aspirin and Brilinta for a year, will be watched carefully in the intensive care unit for preshock, suspect his EDP is extremely high at the end of the procedure with excessive contrast use.  Will monitor for any contrast nephropathy as well.  Recent labs: 11/02/2019: Glucose 85, BUN/Cr 15/1.18. EGFR 66. Na/K 138/4.7.  H/H 14/46. MCV 91. Platelets 222  08/17/2019: Glucose 90, BUN/Cr 17/1.21. EGFR 65. Na/K 136/4.9.  H/H 14/44. MCV 90. Platelets 289  07/26/2019: Glucose 122, BUN/Cr 9/0.9. EGFR 60. H/H 16.1/49.0. MCV 90.2. Platelets 240 HbA1C 5.5% Chol 230, TG 95, HDL 36, LDL 175   Review of Systems  Cardiovascular: Positive for dyspnea on exertion and orthopnea. Negative for chest pain, leg swelling, palpitations and syncope.  Gastrointestinal: Positive for diarrhea and melena.          Vitals:   02/02/20 1045  BP: (!) 103/58  Pulse: (!) 46  Resp: 16  SpO2: 95%     Body mass index is 37.38 kg/m. Filed Weights   02/02/20 1045  Weight: 268 lb (121.6 kg)     Objective:   Physical Exam Vitals and nursing note reviewed.  Constitutional:      General: He is not in acute distress. Neck:     Vascular: No JVD.  Cardiovascular:     Rate and Rhythm: Normal rate and regular rhythm.     Pulses: Intact distal pulses.     Heart sounds: Normal heart sounds. No murmur heard.   Pulmonary:     Effort: Pulmonary effort is normal.     Breath sounds: Normal breath sounds. No wheezing or rales.          Assessment & Recommendations:   59 y.o. 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 S/p primary PCI to culprit prox LAD (07/2019) dFR negative 80% stenosis in OM1 (38871) LAD stent optimized under IVUS (10/2019) Continue Aspirin, Brilinta, Lipitor 80  Check lipid panel. LDL >70, will add Repatha  Ischemic cardiomyopathy: >50% LGE in LAD territory. LVEF 36% Currently on metoprolol succinate 12.5 mg daily, Entesto 24-26 mg bid, spironolactone 25 mg daily. Lasix as needed.  Recommend liberal hydration. Up titration limited due to low normal blood pressures.  Claudication: Suspect below the knee PAD. Will check ABI. He has a callous on his right sole. I will refer him to podiatry.   F/u 4 week  Nigel Mormon, MD [facility] Pager: [phone] Office: [phone]

## 2020-02-05 ENCOUNTER — Encounter (HOSPITAL_COMMUNITY): Payer: Medicare HMO

## 2020-02-07 ENCOUNTER — Encounter (HOSPITAL_COMMUNITY)
Admission: RE | Admit: 2020-02-07 | Discharge: 2020-02-07 | Disposition: A | Discharge status: Home / Self Care | Payer: Medicare HMO | Source: Ambulatory Visit | Attending: Cardiology | Admitting: Cardiology

## 2020-02-07 ENCOUNTER — Other Ambulatory Visit: Payer: Self-pay

## 2020-02-07 DIAGNOSIS — I2109 ST elevation (STEMI) myocardial infarction involving other coronary artery of anterior wall: Secondary | ICD-10-CM

## 2020-02-07 DIAGNOSIS — Z955 Presence of coronary angioplasty implant and graft: Secondary | ICD-10-CM

## 2020-02-09 ENCOUNTER — Encounter (HOSPITAL_COMMUNITY)
Admission: RE | Admit: 2020-02-09 | Discharge: 2020-02-09 | Disposition: A | Discharge status: Home / Self Care | Payer: Medicare HMO | Source: Ambulatory Visit | Attending: Cardiology | Admitting: Cardiology

## 2020-02-09 ENCOUNTER — Other Ambulatory Visit: Payer: Self-pay

## 2020-02-09 DIAGNOSIS — I2109 ST elevation (STEMI) myocardial infarction involving other coronary artery of anterior wall: Secondary | ICD-10-CM | POA: Diagnosis not present

## 2020-02-09 DIAGNOSIS — Z955 Presence of coronary angioplasty implant and graft: Secondary | ICD-10-CM

## 2020-02-12 ENCOUNTER — Encounter (HOSPITAL_COMMUNITY): Payer: Medicare HMO

## 2020-02-13 NOTE — Progress Notes (Signed)
Discharge Progress Report  Patient Details  Name: Jonathon Cain MRN: [id number] Date of Birth: Nov 19, 1960 Referring Provider:     CARDIAC REHAB PHASE II ORIENTATION from 12/21/2019 in [place]  Referring Provider Vernell Leep, MD       Number of Visits: 8  Reason for Discharge:  Patient reached a stable level of exercise.  Smoking History:  Social History   Tobacco Use  Smoking Status Former Smoker  . Packs/day: 1.00  . Years: 10.00  . Pack years: 10.00  . Types: Cigarettes  . Quit date: 07/26/2019  . Years since quitting: 0.5  Smokeless Tobacco Never Used    Diagnosis:  Acute ST elevation myocardial infarction (STEMI) of anterolateral wall (HCC) 07/26/19  Status post coronary artery stent placement 07/26/19  ADL [facility]:   Initial Exercise Prescription:  Initial Exercise Prescription - 12/21/19 1500      Date of Initial Exercise RX and Referring Provider   Date 12/21/19    Referring Provider Vernell Leep, MD    Expected Discharge Date 02/14/20      NuStep   Level 2    SPM 75    Minutes 30    METs 1.7      Prescription Details   Frequency (times per week) 3      Intensity   THRR 40-80% of Max Heartrate 64-127    Ratings of Perceived Exertion 11-13    Perceived Dyspnea 0-4      Resistance Training   Training Prescription Yes    Weight 3 lbs    Reps 10-15           Discharge Exercise Prescription (Final Exercise Prescription Changes):  Exercise Prescription Changes - 02/09/20 1630      Response to Exercise   Blood Pressure (Admit) 110/60    Blood Pressure (Exercise) 112/60    Blood Pressure (Exit) 108/64    Heart Rate (Admit) 75 bpm    Heart Rate (Exercise) 72 bpm    Heart Rate (Exit) 59 bpm    Rating of Perceived Exertion (Exercise) 12    Symptoms None    Comments Pt's last day of exercise in the CRP2 program.    Duration Continue with 30 min of aerobic exercise without signs/symptoms of physical  distress.    Intensity THRR unchanged      Progression   Progression Continue to progress workloads to maintain intensity without signs/symptoms of physical distress.    Average METs 2      Resistance Training   Training Prescription Yes    Weight 3 lbs    Reps 10-15    Time 10 Minutes      Interval Training   Interval Training No      NuStep   Level 3    SPM 75    Minutes 30    METs 2           Functional Capacity:  6 Minute Walk    Row Name 09/19/19 1438 12/21/19 1518       6 Minute Walk   Phase Initial Initial    Distance 900 feet 800 feet    Walk Time 6 minutes 6 minutes    # of Rest Breaks 1 1    MPH 1.7 1.52    METS 1.79 1.51    RPE 9 14    Perceived Dyspnea  1 1    VO2 Peak 6.29 5.28    Symptoms Yes (comment) Yes (comment)  Comments Bilateral leg pain (4 on 0-10 scale). SOB, RPD = 1 SOB, Fatigue, RPD = 1    Resting HR 56 bpm 62 bpm    Resting BP 94/60 101/67    Resting Oxygen Saturation  97 % 95 %    Exercise Oxygen Saturation  during 6 min walk 97 % 97 %    Max Ex. HR 92 bpm 89 bpm    Max Ex. BP 108/63 88/60    2 Minute Post BP 115/73 90/60           Psychological, QOL, Others - Outcomes: PHQ 2/9: Depression screen [facility] 2/9 12/21/2019 09/19/2019  Decreased Interest 0 3  Down, Depressed, Hopeless 0 0  PHQ - 2 Score 0 3  Altered sleeping - 0  Tired, decreased energy - 3  Change in appetite - 3  Feeling bad or failure about yourself  - 0  Trouble concentrating - 0  Moving slowly or fidgety/restless - 0  Suicidal thoughts - 0  PHQ-9 Score - 9  Difficult doing work/chores - Not difficult at all    Quality of Life:  Quality of Life - 12/21/19 1509      Quality of Life   Select Quality of Life      Quality of Life Scores   Health/Function Pre 17.14 %    Socioeconomic Pre 23 %    Psych/Spiritual Pre 17.14 %    Family Pre 18 %    GLOBAL Pre 18.4 %           Personal Goals: Goals established at orientation with interventions  provided to work toward goal.  Personal Goals and Risk Factors at Admission - 12/21/19 1600      Core Components/Risk Factors/Patient Goals on Admission    Weight Management Yes;Weight Loss    Intervention Weight Management: Develop a combined nutrition and exercise program designed to reach desired caloric intake, while maintaining appropriate intake of nutrient and fiber, sodium and fats, and appropriate energy expenditure required for the weight goal.;Weight Management: Provide education and appropriate resources to help participant work on and attain dietary goals.;Weight Management/Obesity: Establish reasonable short term and long term weight goals.;Obesity: Provide education and appropriate resources to help participant work on and attain dietary goals.    Admit Weight 272 lb 11.3 oz (123.7 kg)    Expected Outcomes Short Term: Continue to assess and modify interventions until short term weight is achieved;Long Term: Adherence to nutrition and physical activity/exercise program aimed toward attainment of established weight goal;Weight Loss: Understanding of general recommendations for a balanced deficit meal plan, which promotes 1-2 lb weight loss per week and includes a negative energy balance of [phone] kcal/d;Understanding recommendations for meals to include 15-35% energy as protein, 25-35% energy from fat, 35-60% energy from carbohydrates, less than 200mg  of dietary cholesterol, 20-35 gm of total fiber daily;Understanding of distribution of calorie intake throughout the day with the consumption of 4-5 meals/snacks    Heart Failure Yes    Intervention Provide a combined exercise and nutrition program that is supplemented with education, support and counseling about heart failure. Directed toward relieving symptoms such as shortness of breath, decreased exercise tolerance, and extremity edema.    Expected Outcomes Short term: Attendance in program 2-3 days a week with increased exercise capacity.  Reported lower sodium intake. Reported increased fruit and vegetable intake. Reports medication compliance.;Short term: Daily weights obtained and reported for increase. Utilizing diuretic protocols set by physician.;Long term: Adoption of self-care skills and reduction of barriers  for early signs and symptoms recognition and intervention leading to self-care maintenance.    Lipids Yes    Intervention Provide education and support for participant on nutrition & aerobic/resistive exercise along with prescribed medications to achieve LDL 70mg , HDL >40mg .    Expected Outcomes Short Term: Participant states understanding of desired cholesterol values and is compliant with medications prescribed. Participant is following exercise prescription and nutrition guidelines.;Long Term: Cholesterol controlled with medications as prescribed, with individualized exercise RX and with personalized nutrition plan. Value goals: LDL < 70mg , HDL > 40 mg.    Stress Yes    Intervention Offer individual and/or small group education and counseling on adjustment to heart disease, stress management and health-related lifestyle change. Teach and support self-help strategies.;Refer participants experiencing significant psychosocial distress to appropriate mental health specialists for further evaluation and treatment. When possible, include family members and significant others in education/counseling sessions.    Expected Outcomes Short Term: Participant demonstrates changes in health-related behavior, relaxation and other stress management skills, ability to obtain effective social support, and compliance with psychotropic medications if prescribed.;Long Term: Emotional wellbeing is indicated by absence of clinically significant psychosocial distress or social isolation.            Personal Goals Discharge:  Goals and Risk Factor Review    Row Name 09/27/19 1444 10/10/19 1345 10/31/19 1150 01/18/20 1700       Core  Components/Risk Factors/Patient Goals Review   Personal Goals Review Weight Management/Obesity;Heart Failure;Lipids Weight Management/Obesity;Heart Failure;Lipids -- Weight Management/Obesity;Heart Failure;Lipids    Review Mr. Drost has several CAD risk factors. He is eager to participate in CR for modification. His goals are to be able to walk to the mailbox and back, walk to neighbors home and take out trash with more stamina, strength, and less shortness of breath. He would like to be able to take nephew to [place] once his strength and endurance have improved. Mr. Arkwright has several CAD risk factors. He is eager to participate in CR for modification. His goals are to be able to walk to the mailbox and back, walk to neighbors home and take out trash with more stamina, strength, and less shortness of breath. He would like to be able to take nephew to [place] once his strength and endurance have improved. Mr. Rittenberry has several CAD risk factors. He continues to be eager to participate in CR for modification. His goals are to be able to walk to the mailbox and back, walk to neighbors home and take out trash with more stamina, strength, and less shortness of breath. He would like to be able to take nephew to [place] once his strength and endurance have improved. Aron has done well with exercise at cardiac rehab when inattendance    Expected Outcomes Mr. Meinhardt will continue to participate in CR for risk factor modification and for progression towards personal goals. Mr. Drab will continue to participate in CR for risk factor modification and for progression towards personal goals. Mr. Decaire will continue to participate in CR for risk factor modification and for progression towards personal goals. Othman will continue to participate in phase 2 cardiac rehab for exercise nutrtion and life style modification           Exercise Goals and Review:  Exercise Goals    Row Name 09/19/19 1537 12/21/19 1522            Exercise Goals   Increase Physical Activity Yes Yes      Intervention -- Provide  advice, education, support and counseling about physical activity/exercise needs.;Develop an individualized exercise prescription for aerobic and resistive training based on initial evaluation findings, risk stratification, comorbidities and participant's personal goals.      Expected Outcomes Short Term: Attend rehab on a regular basis to increase amount of physical activity.;Long Term: Add in home exercise to make exercise part of routine and to increase amount of physical activity.;Long Term: Exercising regularly at least 3-5 days a week. Short Term: Attend rehab on a regular basis to increase amount of physical activity.;Long Term: Add in home exercise to make exercise part of routine and to increase amount of physical activity.;Long Term: Exercising regularly at least 3-5 days a week.      Increase Strength and Stamina Yes Yes      Intervention Provide advice, education, support and counseling about physical activity/exercise needs.;Develop an individualized exercise prescription for aerobic and resistive training based on initial evaluation findings, risk stratification, comorbidities and participant's personal goals. Provide advice, education, support and counseling about physical activity/exercise needs.;Develop an individualized exercise prescription for aerobic and resistive training based on initial evaluation findings, risk stratification, comorbidities and participant's personal goals.      Expected Outcomes Short Term: Increase workloads from initial exercise prescription for resistance, speed, and METs.;Short Term: Perform resistance training exercises routinely during rehab and add in resistance training at home;Long Term: Improve cardiorespiratory fitness, muscular endurance and strength as measured by increased METs and functional capacity (6MWT) Short Term: Increase workloads from initial exercise prescription  for resistance, speed, and METs.;Short Term: Perform resistance training exercises routinely during rehab and add in resistance training at home;Long Term: Improve cardiorespiratory fitness, muscular endurance and strength as measured by increased METs and functional capacity (6MWT)      Able to understand and use rate of perceived exertion (RPE) scale Yes Yes      Intervention Provide education and explanation on how to use RPE scale Provide education and explanation on how to use RPE scale      Expected Outcomes Short Term: Able to use RPE daily in rehab to express subjective intensity level;Long Term:  Able to use RPE to guide intensity level when exercising independently Short Term: Able to use RPE daily in rehab to express subjective intensity level;Long Term:  Able to use RPE to guide intensity level when exercising independently      Knowledge and understanding of Target Heart Rate Range (THRR) Yes Yes      Expected Outcomes Short Term: Able to use daily as guideline for intensity in rehab;Long Term: Able to use THRR to govern intensity when exercising independently;Short Term: Able to state/look up THRR Short Term: Able to use daily as guideline for intensity in rehab;Long Term: Able to use THRR to govern intensity when exercising independently;Short Term: Able to state/look up THRR      Able to check pulse independently Yes Yes      Intervention Provide education and demonstration on how to check pulse in carotid and radial arteries.;Review the importance of being able to check your own pulse for safety during independent exercise Provide education and demonstration on how to check pulse in carotid and radial arteries.;Review the importance of being able to check your own pulse for safety during independent exercise      Expected Outcomes Short Term: Able to explain why pulse checking is important during independent exercise;Long Term: Able to check pulse independently and accurately Short Term:  Able to explain why pulse checking is important during independent exercise;Long  Term: Able to check pulse independently and accurately      Understanding of Exercise Prescription Yes Yes      Intervention Provide education, explanation, and written materials on patient's individual exercise prescription Provide education, explanation, and written materials on patient's individual exercise prescription      Expected Outcomes Short Term: Able to explain program exercise prescription;Long Term: Able to explain home exercise prescription to exercise independently Short Term: Able to explain program exercise prescription;Long Term: Able to explain home exercise prescription to exercise independently             Exercise Goals Re-Evaluation:  Exercise Goals Re-Evaluation    Row Name 09/27/19 1500 10/16/19 1415 11/01/19 1619 01/01/20 1630       Exercise Goal Re-Evaluation   Exercise Goals Review Increase Physical Activity;Increase Strength and Stamina;Able to understand and use rate of perceived exertion (RPE) scale;Knowledge and understanding of Target Heart Rate Range (THRR);Able to check pulse independently;Understanding of Exercise Prescription Increase Physical Activity;Increase Strength and Stamina;Able to understand and use rate of perceived exertion (RPE) scale;Knowledge and understanding of Target Heart Rate Range (THRR);Able to check pulse independently;Understanding of Exercise Prescription Increase Physical Activity;Increase Strength and Stamina;Able to understand and use rate of perceived exertion (RPE) scale;Knowledge and understanding of Target Heart Rate Range (THRR);Able to check pulse independently;Understanding of Exercise Prescription Increase Physical Activity;Increase Strength and Stamina;Able to understand and use rate of perceived exertion (RPE) scale;Knowledge and understanding of Target Heart Rate Range (THRR);Able to check pulse independently;Understanding of Exercise Prescription     Comments Pt's first day of CRP2 program. PT tolerated the session well with no complaints. Pt understands Ex Rx, THRR, and RPE scale Reviwed home exercise Rx with patient. Due to patients vision loss he walks on the ramp to his deck and holds the handle for safety. He usually does this for about 10 minutes.We discusssed doing it more than once a day and he may try to do that.H e also has a Wii and will does the sking or boxing for exercise. Due to his visual limitations this could be an adjuct to the walking. Reviwed METs and goals. Pt continues to try and walk but is limited by the hot, humid weather. Voices walking in his driveway. Has not used Wii at home,. Will continue to encourage any amount of additional physical activity. Pt's first day of exercise in the CRP2 program. Pt tolerated session well with nco complaints. Pt understands the exercise Rx, THRR, and RPE scale.    Expected Outcomes Will continue to monitor and progress patient as tolerated. Pt will exercise at home 2x/wk by walking the ramp on his deck or using his Wii for a total of 30 minutes of exercise. Will exercise where he a cumulative total of 30 minutes 2 x/wk. Will continue to monitor and progress patient as tolerated.           Nutrition & Weight - Outcomes:  Pre Biometrics - 12/21/19 1400      Pre Biometrics   Waist Circumference 52 inches    Hip Circumference 49 inches    Waist to Hip Ratio 1.06 %    Triceps Skinfold 33 mm    % Body Fat 39.7 %    Grip Strength 39 kg    Flexibility --   Not done to h/o back surgery   Single Leg Stand --   Not dome: Fall risk, uses cane, visually impaired           Nutrition:  Nutrition  Therapy & Goals - 01/09/20 0653      Nutrition Therapy   Diet Heart healthy    Drug/Food Interactions Statins/Certain Fruits      Personal Nutrition Goals   Nutrition Goal Pt to build a healthy plate including vegetables, fruits, whole grains, and low-fat dairy products in a heart healthy  meal plan.    Personal Goal #2 Pt to identify food quantities necessary to achieve weight loss of 6-24 lb at graduation from cardiac rehab. Pt to weigh and measure serving sizes      Intervention Plan   Intervention Prescribe, educate and counsel regarding individualized specific dietary modifications aiming towards targeted core components such as weight, hypertension, lipid management, diabetes, heart failure and other comorbidities.    Expected Outcomes Short Term Goal: Understand basic principles of dietary content, such as calories, fat, sodium, cholesterol and nutrients.           Nutrition Discharge:  Nutrition Assessments - 01/09/20 0653      MEDFICTS Scores   Pre Score 72           Education Questionnaire Score:  Knowledge Questionnaire Score - 12/21/19 1516      Knowledge Questionnaire Score   Pre Score 23/24           Goals reviewed with patient; copy given to patient.Pt completed cardiac rehab program on 02/13/20 with completion of 8 exercise sessions in Phase II. Talon's attendance was poor while in the program due to transportation and recent bronchitis. Muhammadali's heart rate ranged from 44 to 75. Systolic BP ranged from 70-488/ 89-16 diastolic. Anddy remained asymptomatic with exercise.Barnet Pall, RN,BSN 02/27/2020 10:39 AM

## 2020-02-14 ENCOUNTER — Encounter (HOSPITAL_COMMUNITY): Payer: Medicare HMO

## 2020-02-19 ENCOUNTER — Ambulatory Visit (INDEPENDENT_AMBULATORY_CARE_PROVIDER_SITE_OTHER): Payer: Medicare HMO | Admitting: Physician Assistant

## 2020-02-19 ENCOUNTER — Other Ambulatory Visit: Payer: Self-pay

## 2020-02-19 ENCOUNTER — Encounter: Payer: Self-pay | Admitting: Physician Assistant

## 2020-02-19 VITALS — BP 110/70 | HR 57 | Temp 98.3°F | Resp 16 | Ht 70.0 in | Wt 266.0 lb

## 2020-02-19 DIAGNOSIS — R0600 Dyspnea, unspecified: Secondary | ICD-10-CM | POA: Diagnosis not present

## 2020-02-19 DIAGNOSIS — J208 Acute bronchitis due to other specified organisms: Secondary | ICD-10-CM | POA: Diagnosis not present

## 2020-02-19 DIAGNOSIS — B9689 Other specified bacterial agents as the cause of diseases classified elsewhere: Secondary | ICD-10-CM | POA: Diagnosis not present

## 2020-02-19 DIAGNOSIS — I5020 Unspecified systolic (congestive) heart failure: Secondary | ICD-10-CM | POA: Diagnosis not present

## 2020-02-19 MED ORDER — BENZONATATE 100 MG PO CAPS: 100.0000 mg | ORAL_CAPSULE | Freq: Two times a day (BID) | ORAL | 0 refills | Status: DC | PRN

## 2020-02-19 MED ORDER — AMOXICILLIN-POT CLAVULANATE 875-125 MG PO TABS: 1.0000 | ORAL_TABLET | Freq: Two times a day (BID) | ORAL | 0 refills | Status: DC

## 2020-02-19 NOTE — Progress Notes (Signed)
Patient presents to clinic today to establish care.  Patient with history of coronary artery disease with history of myocardial infarction.  Is status post cardiac stent placement of LAD on 07/26/2019 and coronary balloon angioplasty on 11/14/2019.  Also diagnosed with heart failure with reduced ejection fraction.  Is followed by cardiology, Dr. Virgina Jock.  Has next follow-up scheduled in 1 month from today.  Patient is currently on medication regimen including the following: Spironolactone 25 mg daily, Toprol-XL 12.5 mg daily, Lasix 20 mg daily, Entresto twice daily, atorvastatin 80 mg daily and Brilinta 90 mg twice daily.  Pain endorses despite cardiac interventions he has been having some pain with deep breathing since August. Notes continual dry cough. Denies noted heartburn or indigestion. Notes nasal congestion and drainage. Notes sinus pain over the past 2 weeks, worse over frontal sinuses. Denies ear pain or tooth pain. Has 25+ pack-year smoking history but has been quit for some time. Also with history of seasonal allergies for which he uses Zyrtec OTC with some relief.  Patient denies PND orthopnea.   Health Maintenance: Immunizations --records requested. Colonoscopy --records requested.  Past Medical History:  Diagnosis Date  . Bladder outlet obstruction   . CHF (congestive heart failure) ([place])   . Coronary artery disease   . Hyperlipidemia   . Hypertension   . Retinitis pigmentosa     Past Surgical History:  Procedure Laterality Date  . CARDIAC CATHETERIZATION    . CATARACT EXTRACTION Bilateral 2004  . CORONARY BALLOON ANGIOPLASTY N/A 11/14/2019   Procedure: CORONARY BALLOON ANGIOPLASTY;  Surgeon: Nigel Mormon, MD;  Location: [facility];  Service: Cardiovascular;  Laterality: N/A;  . CORONARY STENT INTERVENTION N/A 07/26/2019   Procedure: CORONARY STENT INTERVENTION;  Surgeon: Adrian Prows, MD;  Location: [facility];  Service: Cardiovascular;  Laterality:  N/A;  . CORONARY/GRAFT ACUTE MI REVASCULARIZATION N/A 07/26/2019   Procedure: CORONARY/GRAFT ACUTE MI REVASCULARIZATION;  Surgeon: Adrian Prows, MD;  Location: [facility];  Service: Cardiovascular;  Laterality: N/A;  . EYE SURGERY    . INTRAVASCULAR PRESSURE WIRE/FFR STUDY N/A 11/14/2019   Procedure: INTRAVASCULAR PRESSURE WIRE/FFR STUDY;  Surgeon: Nigel Mormon, MD;  Location: [facility];  Service: Cardiovascular;  Laterality: N/A;  . INTRAVASCULAR ULTRASOUND/IVUS N/A 11/14/2019   Procedure: Intravascular Ultrasound/IVUS;  Surgeon: Nigel Mormon, MD;  Location: [facility];  Service: Cardiovascular;  Laterality: N/A;  . LEFT HEART CATH AND CORONARY ANGIOGRAPHY N/A 07/26/2019   Procedure: LEFT HEART CATH AND CORONARY ANGIOGRAPHY;  Surgeon: Adrian Prows, MD;  Location: [facility];  Service: Cardiovascular;  Laterality: N/A;  . 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 acetaminophen (TYLENOL) 325 MG tablet Take 2 tablets (650 mg total) by mouth every 4 (four) hours as needed for headache or mild pain.    [name] Kitchen aspirin 81 MG chewable tablet Chew 1 tablet (81 mg total) by mouth daily. 30 tablet 11  . atorvastatin (LIPITOR) 80 MG tablet Take 1 tablet (80 mg total) by mouth daily. 30 tablet 3  . cetirizine (ZYRTEC) 10 MG tablet Take 10 mg by mouth daily as needed for allergies.    [name] Kitchen ENTRESTO 24-26 MG TAKE 1 TABLET BY MOUTH TWICE DAILY 60 tablet 1  . fluticasone (FLONASE) 50 MCG/ACT nasal spray Place 2 sprays into both nostrils daily as needed for allergies  or rhinitis.     . furosemide (LASIX) 20 MG tablet Take 1 tablet (20 mg total) by mouth daily. 30 tablet 1  . metoprolol succinate (TOPROL-XL) 25 MG 24 hr tablet TAKE 1/2 TABLET(12.5 MG) BY MOUTH DAILY 30 tablet 2  . nitroGLYCERIN (NITROSTAT) 0.4 MG SL tablet Place 1 tablet (0.4 mg total) under the tongue  every 5 (five) minutes as needed for chest pain. 25 tablet 2  . pantoprazole (PROTONIX) 40 MG tablet TAKE 1 TABLET(40 MG) BY MOUTH DAILY 30 tablet 2  . spironolactone (ALDACTONE) 25 MG tablet Take 1 tablet (25 mg total) by mouth daily. 90 tablet 3  . ticagrelor (BRILINTA) 90 MG TABS tablet Take 1 tablet (90 mg total) by mouth 2 (two) times daily. 60 tablet 11  . timolol (TIMOPTIC) 0.5 % ophthalmic solution Place 1 drop into both eyes See admin instructions. Place 1 drop into both eyes two times a day for 2 months on/2 months off- alternate with Latanoprost    . tamsulosin (FLOMAX) 0.4 MG CAPS capsule Take 1 capsule by mouth daily as needed.  (Patient not taking: Reported on 02/19/2020)     No current facility-administered medications on file prior to visit.    No Known Allergies  Family History  Problem Relation Age of Onset  . Heart disease Mother   . Hyperlipidemia Mother   . Asthma Mother   . Heart disease Father   . Hypertension Father   . Heart disease Maternal Grandmother   . Heart disease Maternal Grandfather   . Heart disease Paternal Grandmother   . Heart disease Paternal Grandfather     Social History   Socioeconomic History  . Marital status: Single    Spouse name: Not on file  . Number of children: 0  . Years of education: 16  . Highest education level: Bachelor's degree (e.g., BA, AB, BS)  Occupational History  . Not on file  Tobacco Use  . Smoking status: Former Smoker    Packs/day: 1.00    Years: 10.00    Pack years: 10.00    Types: Cigarettes    Quit date: 07/26/2019    Years since quitting: 0.5  . Smokeless tobacco: Never Used  Vaping Use  . Vaping Use: Never used  Substance and Sexual Activity  . Alcohol use: Yes    Comment: occasional  . Drug use: No  . Sexual activity: Not Currently  Other Topics Concern  . Not on file  Social History Narrative  . Not on file   Social Determinants of Health   Financial Resource Strain:   . Difficulty of  Paying Living Expenses: Not on file  Food Insecurity:   . Worried About Charity fundraiser in the Last Year: Not on file  . Ran Out of Food in the Last Year: Not on file  Transportation Needs:   . Lack of Transportation (Medical): Not on file  . Lack of Transportation (Non-Medical): Not on file  Physical Activity:   . Days of Exercise per Week: Not on file  . Minutes of Exercise per Session: Not on file  Stress:   . Feeling of Stress : Not on file  Social Connections:   . Frequency of Communication with Friends and Family: Not on file  . Frequency of Social Gatherings with Friends and Family: Not on file  . Attends Religious Services: Not on file  . Active Member of Clubs or Organizations: Not on file  . Attends Archivist Meetings:  Not on file  . Marital Status: Not on file  Intimate Partner Violence:   . Fear of Current or Ex-Partner: Not on file  . Emotionally Abused: Not on file  . Physically Abused: Not on file  . Sexually Abused: Not on file   ROS Pertinent ROS are listed in the HPI.  Resp 16   Ht $R'5\' 10"'bi$  (1.778 m)   Wt 266 lb (120.7 kg)   BMI 38.17 kg/m   Physical Exam Vitals reviewed.  Constitutional:      Appearance: He is well-developed.  HENT:     Head: Normocephalic and atraumatic.  Eyes:     Pupils: Pupils are equal, round, and reactive to light.  Cardiovascular:     Rate and Rhythm: Normal rate and regular rhythm.  Pulmonary:     Effort: No respiratory distress.     Breath sounds: Examination of the right-middle field reveals decreased breath sounds. Examination of the right-lower field reveals decreased breath sounds and rhonchi. Decreased breath sounds and rhonchi present.  Musculoskeletal:     Cervical back: Normal range of motion.  Neurological:     General: No focal deficit present.     Mental Status: He is alert.    Assessment/Plan: 1. Acute bacterial bronchitis With concern for CAP as cause of his ongoing symptoms. Start  Augmentin. Rx Tessalon. Supportive measures and OTC medications reviewed. CXR today. Will adjust treatment based on results and response to initial treatment.  - amoxicillin-clavulanate (AUGMENTIN) 875-125 MG tablet; Take 1 tablet by mouth 2 (two) times daily.  Dispense: 14 tablet; Refill: 0 - benzonatate (TESSALON) 100 MG capsule; Take 1 capsule (100 mg total) by mouth 2 (two) times daily as needed for cough.  Dispense: 20 capsule; Refill: 0 - DG Chest 2 View; Future  2. Dyspnea, unspecified type In patient with HF but euvolemic on examination. Suspect related to ongoing and untreated respiratory infection. CBC and CMP today. Proceeding with CXR to assess for CAP, etc.  Treatment as previously outlined. If no improvement, plan for CTA and follow-up with Cardiology.  - amoxicillin-clavulanate (AUGMENTIN) 875-125 MG tablet; Take 1 tablet by mouth 2 (two) times daily.  Dispense: 14 tablet; Refill: 0 - CBC w/Diff - Comp Met (CMET) - DG Chest 2 View; Future  3. HFrEF (heart failure with reduced ejection fraction) ([place]) Euvolemic on examination today. Continue management per Cardiology.   This visit occurred during the SARS-CoV-2 public health emergency.  Safety protocols were in place, including screening questions prior to the visit, additional usage of staff PPE, and extensive cleaning of exam room while observing appropriate contact time as indicated for disinfecting solutions.     Leeanne Rio, PA-C

## 2020-02-19 NOTE — Patient Instructions (Addendum)
Take antibiotic (Augmentin) as directed.  Increase fluids.  Get plenty of rest. Use Mucinex (plain) for congestion. Can use Coricidin HBP instead OTC. Use the Tessalon I have sent in for cough. Take a daily probiotic (I recommend Align or Culturelle, but even Activia Yogurt may be beneficial).  A humidifier placed in the bedroom may offer some relief for a dry, scratchy throat of nasal irritation.  Read information below on acute bronchitis.   Please go for x-ray -- [facility]. [place], [place]. M-F 8-5.   Please call or return to clinic if symptoms are not improving.  Acute Bronchitis Bronchitis is when the airways that extend from the windpipe into the lungs get red, puffy, and painful (inflamed). Bronchitis often causes thick spit (mucus) to develop. This leads to a cough. A cough is the most common symptom of bronchitis. In acute bronchitis, the condition usually begins suddenly and goes away over time (usually in 2 weeks). Smoking, allergies, and asthma can make bronchitis worse. Repeated episodes of bronchitis may cause more lung problems.  HOME CARE  Rest.  Drink enough fluids to keep your pee (urine) clear or pale yellow (unless you need to limit fluids as told by your doctor).  Only take over-the-counter or prescription medicines as told by your doctor.  Avoid smoking and secondhand smoke. These can make bronchitis worse. If you are a smoker, think about using nicotine gum or skin patches. Quitting smoking will help your lungs heal faster.  Reduce the chance of getting bronchitis again by:  Washing your hands often.  Avoiding people with cold symptoms.  Trying not to touch your hands to your mouth, nose, or eyes.  Follow up with your doctor as told.  GET HELP IF: Your symptoms do not improve after 1 week of treatment. Symptoms include:  Cough.  Fever.  Coughing up thick spit.  Body aches.  Chest congestion.  Chills.  Shortness of breath.  Sore  throat.  GET HELP RIGHT AWAY IF:   You have an increased fever.  You have chills.  You have severe shortness of breath.  You have bloody thick spit (sputum).  You throw up (vomit) often.  You lose too much body fluid (dehydration).  You have a severe headache.  You faint.  MAKE SURE YOU:   Understand these instructions.  Will watch your condition.  Will get help right away if you are not doing well or get worse. Document Released: 08/26/2007 Document Revised: 11/09/2012 Document Reviewed: 08/30/2012 [facility] Patient Information 2015 [place], [place]. This information is not intended to replace advice given to you by your health care provider. Make sure you discuss any questions you have with your health care provider.

## 2020-02-20 LAB — COMPREHENSIVE METABOLIC PANEL
ALT: 55 U/L — ABNORMAL HIGH (ref 0–53)
AST: 43 U/L — ABNORMAL HIGH (ref 0–37)
Albumin: 3.9 g/dL (ref 3.5–5.2)
Alkaline Phosphatase: 216 U/L — ABNORMAL HIGH (ref 39–117)
BUN: 21 mg/dL (ref 6–23)
CO2: 23 mEq/L (ref 19–32)
Calcium: 8.9 mg/dL (ref 8.4–10.5)
Chloride: 102 mEq/L (ref 96–112)
Creatinine, Ser: 1.04 mg/dL (ref 0.40–1.50)
GFR: 77.54 mL/min (ref >60.00)
Glucose, Bld: 90 mg/dL (ref 70–99)
Potassium: 4 mEq/L (ref 3.5–5.1)
Sodium: 136 mEq/L (ref 135–145)
Total Bilirubin: 1.1 mg/dL (ref 0.2–1.2)
Total Protein: 7.4 g/dL (ref 6.0–8.3)

## 2020-02-20 LAB — CBC WITH DIFFERENTIAL/PLATELET
Basophils Absolute: 0.2 10*3/uL — ABNORMAL HIGH (ref 0.0–0.1)
Basophils Relative: 1.1 % (ref 0.0–3.0)
Eosinophils Absolute: 0.3 10*3/uL (ref 0.0–0.7)
Eosinophils Relative: 2 % (ref 0.0–5.0)
HCT: 50.2 % (ref 39.0–52.0)
Hemoglobin: 16.8 g/dL (ref 13.0–17.0)
Lymphocytes Relative: 14.8 % (ref 12.0–46.0)
Lymphs Abs: 2.4 10*3/uL (ref 0.7–4.0)
MCHC: 33.5 g/dL (ref 30.0–36.0)
MCV: 87.2 fl (ref 78.0–100.0)
Monocytes Absolute: 1.1 10*3/uL — ABNORMAL HIGH (ref 0.1–1.0)
Monocytes Relative: 6.8 % (ref 3.0–12.0)
Neutro Abs: 12.3 10*3/uL — ABNORMAL HIGH (ref 1.4–7.7)
Neutrophils Relative %: 75.3 % (ref 43.0–77.0)
Platelets: 258 10*3/uL (ref 150.0–400.0)
RBC: 5.76 Mil/uL (ref 4.22–5.81)
RDW: 15.4 % (ref 11.5–15.5)
WBC: 16.3 10*3/uL — ABNORMAL HIGH (ref 4.0–10.5)

## 2020-02-22 ENCOUNTER — Other Ambulatory Visit: Payer: Self-pay

## 2020-02-22 ENCOUNTER — Ambulatory Visit (INDEPENDENT_AMBULATORY_CARE_PROVIDER_SITE_OTHER)
Admission: RE | Admit: 2020-02-22 | Discharge: 2020-02-22 | Disposition: A | Discharge status: Home / Self Care | Payer: Medicare HMO | Source: Ambulatory Visit | Attending: Physician Assistant | Admitting: Physician Assistant

## 2020-02-22 ENCOUNTER — Ambulatory Visit (INDEPENDENT_AMBULATORY_CARE_PROVIDER_SITE_OTHER): Payer: Medicare HMO | Admitting: Podiatry

## 2020-02-22 DIAGNOSIS — L84 Corns and callosities: Secondary | ICD-10-CM

## 2020-02-22 DIAGNOSIS — B9689 Other specified bacterial agents as the cause of diseases classified elsewhere: Secondary | ICD-10-CM

## 2020-02-22 DIAGNOSIS — I739 Peripheral vascular disease, unspecified: Secondary | ICD-10-CM | POA: Diagnosis not present

## 2020-02-22 DIAGNOSIS — R0600 Dyspnea, unspecified: Secondary | ICD-10-CM | POA: Diagnosis not present

## 2020-02-22 DIAGNOSIS — J208 Acute bronchitis due to other specified organisms: Secondary | ICD-10-CM | POA: Diagnosis not present

## 2020-02-25 NOTE — Progress Notes (Signed)
  Subjective:  Patient ID: Jonathon Cain, male    DOB: 11-22-1960,  MRN: [id number]  Chief Complaint  Patient presents with  . Callouses    Right foot underneat the hallux. PT stated that it has been there for about 2-3 months and is only painful when he doesnt wear shoes     59 y.o. male presents with the above complaint. History confirmed with patient.   Objective:  Physical Exam: warm, good capillary refill, DP reduced right, no trophic changes or ulcerative lesions, normal sensory exam and PT reduced right.  Right Foot: callus plantar hallux    Assessment:   1. Pre-ulcerative calluses   2. Peripheral arterial disease ([place])      Plan:  Patient was evaluated and treated and all questions answered.  All symptomatic hyperkeratoses were safely debrided with a sterile #15 blade to patient's level of comfort without incident. We discussed preventative and palliative care of these lesions including supportive and accommodative shoegear, padding, prefabricated and custom molded accommodative orthoses, use of a pumice stone and lotions/creams daily.  Some concern for PAD, poorly palpable pulses. He has coronary artery disease and recently had a heart attack and has had stents put in.  I think it would be wise to check his lower extremity vasculature and I have ordered ABIs with ultrasound to evaluate this  Return in about 10 weeks (around 05/02/2020) for check on callus, PAD.

## 2020-02-27 ENCOUNTER — Other Ambulatory Visit: Payer: Self-pay

## 2020-02-27 MED ORDER — FUROSEMIDE 40 MG PO TABS: 40.0000 mg | ORAL_TABLET | Freq: Every day | ORAL | 0 refills | Status: DC

## 2020-02-28 ENCOUNTER — Other Ambulatory Visit: Payer: Self-pay | Admitting: Podiatry

## 2020-02-28 DIAGNOSIS — I739 Peripheral vascular disease, unspecified: Secondary | ICD-10-CM

## 2020-02-28 DIAGNOSIS — L84 Corns and callosities: Secondary | ICD-10-CM

## 2020-03-07 ENCOUNTER — Other Ambulatory Visit: Payer: Self-pay

## 2020-03-07 ENCOUNTER — Ambulatory Visit (HOSPITAL_COMMUNITY)
Admission: RE | Admit: 2020-03-07 | Discharge: 2020-03-07 | Disposition: A | Discharge status: Home / Self Care | Payer: Medicare HMO | Source: Ambulatory Visit | Attending: Cardiology | Admitting: Cardiology

## 2020-03-07 DIAGNOSIS — L84 Corns and callosities: Secondary | ICD-10-CM

## 2020-03-07 DIAGNOSIS — I739 Peripheral vascular disease, unspecified: Secondary | ICD-10-CM | POA: Insufficient documentation

## 2020-03-13 ENCOUNTER — Encounter: Payer: Self-pay | Admitting: Cardiology

## 2020-03-13 ENCOUNTER — Other Ambulatory Visit: Payer: Self-pay

## 2020-03-13 ENCOUNTER — Ambulatory Visit: Payer: Medicare HMO | Admitting: Cardiology

## 2020-03-13 ENCOUNTER — Other Ambulatory Visit: Payer: Self-pay | Admitting: Cardiology

## 2020-03-13 VITALS — BP 93/43 | HR 44 | Ht 70.0 in | Wt 264.0 lb

## 2020-03-13 DIAGNOSIS — I2510 Atherosclerotic heart disease of native coronary artery without angina pectoris: Secondary | ICD-10-CM

## 2020-03-13 DIAGNOSIS — I5020 Unspecified systolic (congestive) heart failure: Secondary | ICD-10-CM

## 2020-03-13 DIAGNOSIS — L84 Corns and callosities: Secondary | ICD-10-CM

## 2020-03-13 DIAGNOSIS — Z006 Encounter for examination for normal comparison and control in clinical research program: Secondary | ICD-10-CM

## 2020-03-13 DIAGNOSIS — I739 Peripheral vascular disease, unspecified: Secondary | ICD-10-CM

## 2020-03-13 NOTE — Progress Notes (Signed)
Patient referred by Lindie Spruce, MD for CAD  Subjective:   Jonathon Cain, male    DOB: 08-24-1960, 59 y.o.   MRN: [id number]   Chief Complaint  Patient presents with  . heart failure with reduced ejection fraction  . Follow-up     HPI  59 y.o. 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 He has lifestyle limiting claudication, in spite of medical therapy and cardiac rehab. Recently, he has had calloses on right sole for which he saw podiatry.   He is enrolled in EMPACT-MI clinical trial.    Current Outpatient Medications on File Prior to Visit  Medication Sig Dispense Refill  . acetaminophen (TYLENOL) 325 MG tablet Take 2 tablets (650 mg total) by mouth every 4 (four) hours as needed for headache or mild pain.    [name] Kitchen amoxicillin-clavulanate (AUGMENTIN) 875-125 MG tablet Take 1 tablet by mouth 2 (two) times daily. 14 tablet 0  . aspirin 81 MG chewable tablet Chew 1 tablet (81 mg total) by mouth daily. 30 tablet 11  . atorvastatin (LIPITOR) 80 MG tablet Take 1 tablet (80 mg total) by mouth daily. 30 tablet 3  . benzonatate (TESSALON) 100 MG capsule Take 1 capsule (100 mg total) by mouth 2 (two) times daily as needed for cough. 20 capsule 0  . cetirizine (ZYRTEC) 10 MG tablet Take 10 mg by mouth daily as needed for allergies.    [name] Kitchen ENTRESTO 24-26 MG TAKE 1 TABLET BY MOUTH TWICE DAILY 60 tablet 1  . fluticasone (FLONASE) 50 MCG/ACT nasal spray Place 2 sprays into both nostrils daily as needed for allergies or rhinitis.     . furosemide (LASIX) 40 MG tablet Take 1 tablet (40 mg total) by mouth daily. 30 tablet 0  . latanoprost (XALATAN) 0.005 % ophthalmic solution Place 1 drop into both eyes at bedtime.    . metoprolol  succinate (TOPROL-XL) 25 MG 24 hr tablet TAKE 1/2 TABLET(12.5 MG) BY MOUTH DAILY 30 tablet 2  . nitroGLYCERIN (NITROSTAT) 0.4 MG SL tablet Place 1 tablet (0.4 mg total) under the tongue every 5 (five) minutes as needed for chest pain. 25 tablet 2  . pantoprazole (PROTONIX) 40 MG tablet TAKE 1 TABLET(40 MG) BY MOUTH DAILY 30 tablet 2  . spironolactone (ALDACTONE) 25 MG tablet Take 1 tablet (25 mg total) by mouth daily. 90 tablet 3  . tamsulosin (FLOMAX) 0.4 MG CAPS capsule Take 1 capsule by mouth daily as needed.  (Patient not taking: Reported on 02/19/2020)    . ticagrelor (BRILINTA) 90 MG TABS tablet Take 1 tablet (90 mg total) by mouth 2 (two) times daily. 60 tablet 11  . timolol (TIMOPTIC) 0.5 % ophthalmic solution Place 1 drop into both eyes See admin instructions. Place 1 drop into both eyes two times a day for 2 months on/2 months off- alternate with Latanoprost     No current facility-administered medications on file prior to visit.    Cardiovascular and other pertinent studies:  ABI/LEA duplex 03/07/2020: Right: >50% stenosis of the right external iliac artery.  Occlusion of the distal SFA/above-knee popliteal area. Suspected occlusion  of the distal peroneal artery.   Left: Severe >50% stenosis of the left external iliac artery.  Segmental occlusion of the left distal SFA. Suspected occlusion of the  distal peroneal artery.   Right: Resting right ankle-brachial index indicates moderate right lower extremity arterial disease. ABI 0.75. The right toe-brachial index is abnormal.   Left: Resting left ankle-brachial index indicates moderate left lower extremity arterial disease. ABI 0.66. The left toe-brachial index is abnormal.       EKG 02/02/2020: Junctional rhythm/einus bradycardia 42 bpm Left anterolateral infarct Left anterior fascicular block No ischemic  Cardiac MRI 02/01/2020: 1. Subendocardial late gadolinium enhancement consistent with prior infarct in LAD  territory. There is >50% transmural LGE suggesting nonviability in mid anterior/anteroseptal walls, apical anterior/septal/inferior walls, and apex. 2. Mild LV dilatation with moderate systolic function (EF 80%). Akinesis of mid to apical anterior, mid anteroseptal, apical septal, apical inferior, and apex. 3.  Normal RV size and systolic function (EF 03%) 4.  Moderate mitral regurgitation (regurgitant fraction 24%) 5.  Moderate right pleural effusion  Coronary angiography 11/14/2019: LM: Diffuse 30% disease. IVUS MLA 7.3 mm2,. dFR 1.0 LAD: Patent prox LAD stent with udner expansion        Optimization with 3.5X15 mm Slayden balloon at 22 atm        Excellent expansion as seen on IVUS        TIMI flow II-->III        Diag: Mod diffuse disease LCx: 80% ostial OM1 stenosis. dFR 0.98 RCA: Not engaged today  Echocardiogram 10/24/2019:  Left ventricle cavity is mildly dilated. Distal anteroseptal and apical  akinesis. LV endocardial border not well visualized to accurately asses  EF, probably around 30%. Recommend alternate imaging modality to  accurately assess EF.  Doppler evidence of grade III (restrictive)  diastolic dysfunction.  Left atrial cavity is severely dilated.  Moderate (Grade III) mitral regurgitation.  Inadequate TR jet to estimate PASP. Estimated RA pressure 8 mmHg.  No significant change compared to previous study on 07/27/2019.   Left Heart Catheterization 07/26/19 (Dr. Einar Gip):  LM: Mild diffuse disease LAD: Very large caliber vessel, occluded at the proximal end.  Gives origin to high D1 which is large.  Successful PTCA and stenting of the proximal LAD overlapping D1, 3.5 x 24 mm Synergy DES deployed at 12 atmospheric pressure for 60 S. 100% to 0% with TIMI 0 to TIMI-3 flow. LCx: Large vessel, giving origin to a very large OM1 which is secondary branches.  Ostium of OM1 has 80% stenosis. RCA: Mild diffuse disease, distal RCA 15 to 20% stenosis, PDA 50%  stenosis.  Recommendation: Patient will need aggressive risk factor modification, dual antiplatelet therapy with aspirin and Brilinta for a year, will be watched carefully in the intensive care unit for preshock, suspect his EDP is extremely high at the end of the procedure with excessive contrast use.  Will monitor for any contrast nephropathy as well.  Recent labs: 02/19/2020: Glucose 90, BUN/Cr 21/1.04. EGFR 77. Na/K 136/4.0.  AST/ALT 43/55, AlKP 216 H/H 16/50. MCV 87. Platelets 258   11/02/2019: Glucose 85, BUN/Cr 15/1.18. EGFR 66. Na/K 138/4.7.  H/H 14/46. MCV 91. Platelets 222  08/17/2019: Glucose 90, BUN/Cr 17/1.21. EGFR 65. Na/K 136/4.9.  H/H 14/44. MCV 90. Platelets 289  07/26/2019: Glucose 122, BUN/Cr 9/0.9. EGFR 60. H/H 16.1/49.0. MCV 90.2. Platelets 240 HbA1C 5.5% Chol 230, TG 95, HDL 36, LDL 175   Review of Systems  Cardiovascular: Positive for dyspnea on exertion and orthopnea. Negative for chest pain, leg swelling, palpitations and syncope.  Gastrointestinal: Positive for diarrhea and melena.         Vitals:   03/13/20 1314  BP: [name] Kitchen)  93/43  Pulse: (!) 44  SpO2: 95%     Body mass index is 37.88 kg/m. Filed Weights   03/13/20 1314  Weight: 264 lb (119.7 kg)     Objective:   Physical Exam Vitals and nursing note reviewed.  Constitutional:      General: He is not in acute distress. Neck:     Vascular: No JVD.  Cardiovascular:     Rate and Rhythm: Normal rate and regular rhythm.     Pulses: Intact distal pulses.          Dorsalis pedis pulses are 1+ on the right side and 1+ on the left side.       Posterior tibial pulses are 0 on the right side and 0 on the left side.     Heart sounds: Normal heart sounds. No murmur heard.   Pulmonary:     Effort: Pulmonary effort is normal.     Breath sounds: Normal breath sounds. No wheezing or rales.          Assessment & Recommendations:   59 y.o. 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 S/p primary PCI to culprit prox LAD (07/2019) dFR negative 80% stenosis in OM1 (73220) LAD stent optimized under IVUS (10/2019) Continue Aspirin, Brilinta, Lipitor 80  Check lipid panel. If LDL >70, will add Repatha  Ischemic cardiomyopathy: >50% LGE in LAD territory. LVEF 36% Currently on metoprolol succinate 12.5 mg daily, Entesto 24-26 mg bid, spironolactone 25 mg daily. Lasix 40/20 mg alternate  PAD: He has lifestyle limiting claudication, in spite of medical therapy and cardiac rehab, now with pre-ulcerative calloses on right sole. Patient wants to pursue invasive angiography and possible intervention. Will proceed with lower extremity angiography, and intervention  Jonathon Mormon, MD [facility] Pager: [phone] Office: [phone]

## 2020-03-14 ENCOUNTER — Other Ambulatory Visit (HOSPITAL_COMMUNITY): Payer: Self-pay

## 2020-03-14 LAB — BASIC METABOLIC PANEL
BUN/Creatinine Ratio: 13 (ref 10–24)
BUN: 16 mg/dL (ref 8–27)
CO2: 21 mmol/L (ref 20–29)
Calcium: 9.2 mg/dL (ref 8.6–10.2)
Chloride: 100 mmol/L (ref 96–106)
Creatinine, Ser: 1.19 mg/dL (ref 0.76–1.27)
GFR calc Af Amer: 76 mL/min/{1.73_m2} (ref >59)
GFR calc non Af Amer: 66 mL/min/{1.73_m2} (ref >59)
Glucose: 88 mg/dL (ref 65–99)
Potassium: 4.6 mmol/L (ref 3.5–5.2)
Sodium: 137 mmol/L (ref 134–144)

## 2020-03-14 LAB — CBC
Hematocrit: 49.7 % (ref 37.5–51.0)
Hemoglobin: 16.9 g/dL (ref 13.0–17.7)
MCH: 29.5 pg (ref 26.6–33.0)
MCHC: 34 g/dL (ref 31.5–35.7)
MCV: 87 fL (ref 79–97)
Platelets: 264 10*3/uL (ref 150–450)
RBC: 5.72 x10E6/uL (ref 4.14–5.80)
RDW: 13.4 % (ref 11.6–15.4)
WBC: 15.5 10*3/uL — ABNORMAL HIGH (ref 3.4–10.8)

## 2020-03-16 ENCOUNTER — Other Ambulatory Visit: Payer: Self-pay | Admitting: Cardiology

## 2020-03-18 ENCOUNTER — Other Ambulatory Visit (HOSPITAL_COMMUNITY)
Admission: RE | Admit: 2020-03-18 | Discharge: 2020-03-18 | Disposition: A | Discharge status: Home / Self Care | Payer: Medicare HMO | Source: Ambulatory Visit | Attending: Cardiology | Admitting: Cardiology

## 2020-03-18 DIAGNOSIS — Z20822 Contact with and (suspected) exposure to covid-19: Secondary | ICD-10-CM | POA: Diagnosis not present

## 2020-03-18 DIAGNOSIS — Z01812 Encounter for preprocedural laboratory examination: Secondary | ICD-10-CM | POA: Insufficient documentation

## 2020-03-18 LAB — SARS CORONAVIRUS 2 (TAT 6-24 HRS): SARS Coronavirus 2: NEGATIVE

## 2020-03-19 ENCOUNTER — Ambulatory Visit (HOSPITAL_COMMUNITY)
Admission: RE | Admit: 2020-03-19 | Discharge: 2020-03-19 | Disposition: A | Discharge status: Home / Self Care | Payer: Medicare HMO | Attending: Cardiology | Admitting: Cardiology

## 2020-03-19 ENCOUNTER — Other Ambulatory Visit: Payer: Self-pay

## 2020-03-19 ENCOUNTER — Encounter (HOSPITAL_COMMUNITY)
Admission: RE | Disposition: A | Discharge status: Home / Self Care | Payer: Self-pay | Source: Home / Self Care | Attending: Cardiology

## 2020-03-19 DIAGNOSIS — Z6836 Body mass index (BMI) 36.0-36.9, adult: Secondary | ICD-10-CM | POA: Insufficient documentation

## 2020-03-19 DIAGNOSIS — I2510 Atherosclerotic heart disease of native coronary artery without angina pectoris: Secondary | ICD-10-CM | POA: Diagnosis not present

## 2020-03-19 DIAGNOSIS — Z87891 Personal history of nicotine dependence: Secondary | ICD-10-CM | POA: Insufficient documentation

## 2020-03-19 DIAGNOSIS — Z79899 Other long term (current) drug therapy: Secondary | ICD-10-CM | POA: Insufficient documentation

## 2020-03-19 DIAGNOSIS — Z7982 Long term (current) use of aspirin: Secondary | ICD-10-CM | POA: Diagnosis not present

## 2020-03-19 DIAGNOSIS — I739 Peripheral vascular disease, unspecified: Secondary | ICD-10-CM

## 2020-03-19 DIAGNOSIS — E785 Hyperlipidemia, unspecified: Secondary | ICD-10-CM | POA: Diagnosis not present

## 2020-03-19 DIAGNOSIS — I70211 Atherosclerosis of native arteries of extremities with intermittent claudication, right leg: Secondary | ICD-10-CM | POA: Diagnosis not present

## 2020-03-19 DIAGNOSIS — I255 Ischemic cardiomyopathy: Secondary | ICD-10-CM | POA: Diagnosis not present

## 2020-03-19 HISTORY — PX: ABDOMINAL AORTOGRAM W/LOWER EXTREMITY: CATH118223

## 2020-03-19 HISTORY — PX: PERIPHERAL VASCULAR INTERVENTION: CATH118257

## 2020-03-19 HISTORY — PX: PERIPHERAL VASCULAR BALLOON ANGIOPLASTY: CATH118281

## 2020-03-19 LAB — POCT ACTIVATED CLOTTING TIME
Activated Clotting Time: 273 seconds
Activated Clotting Time: 267 seconds

## 2020-03-19 SURGERY — ABDOMINAL AORTOGRAM W/LOWER EXTREMITY
Anesthesia: LOCAL | Laterality: Right

## 2020-03-19 MED ORDER — ONDANSETRON HCL 4 MG/2ML IJ SOLN: 4.0000 mg | Freq: Four times a day (QID) | INTRAMUSCULAR | Status: DC | PRN

## 2020-03-19 MED ORDER — HEPARIN SODIUM (PORCINE) 1000 UNIT/ML IJ SOLN
INTRAMUSCULAR | Status: DC | PRN
  Administered 2020-03-19: 10000 [IU] via INTRAVENOUS
  Administered 2020-03-19: 2000 [IU] via INTRAVENOUS

## 2020-03-19 MED ORDER — SODIUM CHLORIDE 0.9% FLUSH
3.0000 mL | INTRAVENOUS | Status: DC | PRN
Start: 2020-03-19 — End: 2020-03-19

## 2020-03-19 MED ORDER — ACETAMINOPHEN 325 MG PO TABS: 650.0000 mg | ORAL_TABLET | ORAL | Status: DC | PRN

## 2020-03-19 MED ORDER — SODIUM CHLORIDE 0.9% FLUSH: 3.0000 mL | Freq: Two times a day (BID) | INTRAVENOUS | Status: DC

## 2020-03-19 MED ORDER — LIDOCAINE HCL (PF) 1 % IJ SOLN
INTRAMUSCULAR | Status: DC | PRN
  Administered 2020-03-19: 15 mL via INTRADERMAL

## 2020-03-19 MED ORDER — HEPARIN SODIUM (PORCINE) 1000 UNIT/ML IJ SOLN
INTRAMUSCULAR | Status: AC
  Filled 2020-03-19: qty 1

## 2020-03-19 MED ORDER — IODIXANOL 320 MG/ML IV SOLN
INTRAVENOUS | Status: DC | PRN
  Administered 2020-03-19: 12:00:00 210 mL via INTRA_ARTERIAL

## 2020-03-19 MED ORDER — HEPARIN (PORCINE) IN NACL 1000-0.9 UT/500ML-% IV SOLN
INTRAVENOUS | Status: DC | PRN
  Administered 2020-03-19 (×2): 500 mL

## 2020-03-19 MED ORDER — SODIUM CHLORIDE 0.9 % IV SOLN: 250.0000 mL | INTRAVENOUS | Status: DC | PRN

## 2020-03-19 MED ORDER — ASPIRIN 81 MG PO CHEW: 81.0000 mg | CHEWABLE_TABLET | ORAL | Status: DC

## 2020-03-19 MED ORDER — SODIUM CHLORIDE 0.9 % IV SOLN: INTRAVENOUS | Status: AC

## 2020-03-19 MED ORDER — FENTANYL CITRATE (PF) 100 MCG/2ML IJ SOLN
INTRAMUSCULAR | Status: AC
  Filled 2020-03-19: qty 2

## 2020-03-19 MED ORDER — MIDAZOLAM HCL 2 MG/2ML IJ SOLN
INTRAMUSCULAR | Status: DC | PRN
  Administered 2020-03-19: 1 mg via INTRAVENOUS

## 2020-03-19 MED ORDER — SODIUM CHLORIDE 0.9% FLUSH: 3.0000 mL | INTRAVENOUS | Status: DC | PRN

## 2020-03-19 MED ORDER — LABETALOL HCL 5 MG/ML IV SOLN: 10.0000 mg | INTRAVENOUS | Status: DC | PRN

## 2020-03-19 MED ORDER — HEPARIN (PORCINE) IN NACL 1000-0.9 UT/500ML-% IV SOLN
INTRAVENOUS | Status: AC
  Filled 2020-03-19: qty 1000

## 2020-03-19 MED ORDER — FENTANYL CITRATE (PF) 100 MCG/2ML IJ SOLN
INTRAMUSCULAR | Status: DC | PRN
  Administered 2020-03-19 (×3): 50 ug via INTRAVENOUS

## 2020-03-19 MED ORDER — HYDRALAZINE HCL 20 MG/ML IJ SOLN: 5.0000 mg | INTRAMUSCULAR | Status: DC | PRN

## 2020-03-19 MED ORDER — SODIUM CHLORIDE 0.9 % IV SOLN: INTRAVENOUS | Status: DC

## 2020-03-19 MED ORDER — LIDOCAINE HCL (PF) 1 % IJ SOLN
INTRAMUSCULAR | Status: AC
  Filled 2020-03-19: qty 30

## 2020-03-19 MED ORDER — MIDAZOLAM HCL 2 MG/2ML IJ SOLN
INTRAMUSCULAR | Status: AC
  Filled 2020-03-19: qty 2

## 2020-03-19 SURGICAL SUPPLY — 27 items
BALLN IN.PACT DCB 5X80 (BALLOONS) ×4
BALLN MUSTANG 5.0X40 135 (BALLOONS) ×4
BALLN MUSTANG 8X20X75 (BALLOONS) ×4
BALLOON MUSTANG 5.0X40 135 (BALLOONS) IMPLANT
BALLOON MUSTANG 8X20X75 (BALLOONS) IMPLANT
CATH OMNI FLUSH 5F 65CM (CATHETERS) ×1 IMPLANT
CLOSURE PERCLOSE PROSTYLE (VASCULAR PRODUCTS) ×1 IMPLANT
DCB IN.PACT 5X80 (BALLOONS) IMPLANT
GUIDEWIRE ANGLED .035X260CM (WIRE) ×1 IMPLANT
GUIDEWIRE ASTATO XS 20G 300CM (WIRE) ×1 IMPLANT
KIT ENCORE 26 ADVANTAGE (KITS) ×1 IMPLANT
KIT MICROPUNCTURE NIT STIFF (SHEATH) ×1 IMPLANT
KIT PV (KITS) ×4 IMPLANT
SHEATH PINNACLE 5F 10CM (SHEATH) ×1 IMPLANT
SHEATH PINNACLE ST 7F 45CM (SHEATH) ×1 IMPLANT
SHEATH PROBE COVER 6X72 (BAG) ×1 IMPLANT
STENT EPIC 10X40X75 (Permanent Stent) ×1 IMPLANT
STENT EPIC VASCULAR 9X40X75 (Permanent Stent) ×1 IMPLANT
STOPCOCK MORSE 400PSI 3WAY (MISCELLANEOUS) ×1 IMPLANT
SYR MEDRAD MARK 7 150ML (SYRINGE) ×4 IMPLANT
TAPE VIPERTRACK RADIOPAQ (MISCELLANEOUS) IMPLANT
TAPE VIPERTRACK RADIOPAQUE (MISCELLANEOUS) ×4
TRANSDUCER W/STOPCOCK (MISCELLANEOUS) ×4 IMPLANT
TRAY PV CATH (CUSTOM PROCEDURE TRAY) ×4 IMPLANT
TUBING CIL FLEX 10 FLL-RA (TUBING) ×1 IMPLANT
WIRE BENTSON .035X145CM (WIRE) ×1 IMPLANT
WIRE HI TORQ VERSACORE J 260CM (WIRE) ×1 IMPLANT

## 2020-03-19 NOTE — Interval H&P Note (Signed)
History and Physical Interval Note:  03/19/2020 9:15 AM  Jonathon Cain  has presented today for surgery, with the diagnosis of pad.  The various methods of treatment have been discussed with the patient and family. After consideration of risks, benefits and other options for treatment, the patient has consented to  Procedure(s): ABDOMINAL AORTOGRAM W/LOWER EXTREMITY (N/A) as a surgical intervention.  The patient's history has been reviewed, patient examined, no change in status, stable for surgery.  I have reviewed the patient's chart and labs.  Questions were answered to the patient's satisfaction.     Haris Baack J Cammeron Greis

## 2020-03-19 NOTE — H&P (Signed)
OV note copied for documentation     Patient referred by No ref. provider found for CAD  Subjective:   Jonathon Cain, male    DOB: 1960/08/09, 59 y.o.   MRN: [id number]  C/C: PAD   HPI  59 y.o. 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 He has lifestyle limiting claudication, in spite of medical therapy and cardiac rehab. Recently, he has had calloses on right sole for which he saw podiatry.   He is enrolled in EMPACT-MI clinical trial.    No current facility-administered medications on file prior to encounter.   Current Outpatient Medications on File Prior to Encounter  Medication Sig Dispense Refill  . acetaminophen (TYLENOL) 325 MG tablet Take 2 tablets (650 mg total) by mouth every 4 (four) hours as needed for headache or mild pain.    [name] Kitchen aspirin 81 MG chewable tablet Chew 1 tablet (81 mg total) by mouth daily. 30 tablet 11  . atorvastatin (LIPITOR) 80 MG tablet Take 1 tablet (80 mg total) by mouth daily. 30 tablet 3  . cetirizine (ZYRTEC) 10 MG tablet Take 10 mg by mouth daily as needed for allergies.    . fluticasone (FLONASE) 50 MCG/ACT nasal spray Place 2 sprays into both nostrils daily as needed for allergies or rhinitis.     . furosemide (LASIX) 40 MG tablet Take 1 tablet (40 mg total) by mouth daily. 30 tablet 0  . metoprolol succinate (TOPROL-XL) 25 MG 24 hr tablet TAKE 1/2 TABLET(12.5 MG) BY MOUTH DAILY (Patient taking differently: Take 12.5 mg by mouth daily.) 30 tablet 2  . pantoprazole (PROTONIX) 40 MG tablet TAKE 1 TABLET(40 MG) BY MOUTH DAILY (Patient taking differently: Take 40 mg by mouth daily.) 30 tablet 2  . spironolactone (ALDACTONE) 25 MG tablet Take 1 tablet (25 mg total) by mouth daily. 90  tablet 3  . tamsulosin (FLOMAX) 0.4 MG CAPS capsule Take 1 capsule by mouth daily as needed.    . ticagrelor (BRILINTA) 90 MG TABS tablet Take 1 tablet (90 mg total) by mouth 2 (two) times daily. 60 tablet 11  . latanoprost (XALATAN) 0.005 % ophthalmic solution Place 1 drop into both eyes at bedtime.    . nitroGLYCERIN (NITROSTAT) 0.4 MG SL tablet Place 1 tablet (0.4 mg total) under the tongue every 5 (five) minutes as needed for chest pain. 25 tablet 2  . timolol (TIMOPTIC) 0.5 % ophthalmic solution Place 1 drop into both eyes See admin instructions. Place 1 drop into both eyes two times a day for 2 months on/2 months off- alternate with Latanoprost      Cardiovascular and other pertinent studies:  ABI/LEA duplex 03/07/2020: Right: >50% stenosis of the right external iliac artery.  Occlusion of the distal SFA/above-knee popliteal area. Suspected occlusion  of the distal peroneal artery.   Left: Severe >50% stenosis of the left external iliac artery.  Segmental occlusion of the left distal SFA. Suspected occlusion of the  distal peroneal artery.   Right: Resting right ankle-brachial index indicates moderate right lower extremity arterial disease. ABI 0.75. The right toe-brachial index is abnormal.   Left: Resting left ankle-brachial index indicates moderate left lower extremity arterial disease. ABI 0.66. The left toe-brachial index is abnormal.       EKG 02/02/2020: Junctional rhythm/einus bradycardia 42  bpm Left anterolateral infarct Left anterior fascicular block No ischemic  Cardiac MRI 02/01/2020: 1. Subendocardial late gadolinium enhancement consistent with prior infarct in LAD territory. There is >50% transmural LGE suggesting nonviability in mid anterior/anteroseptal walls, apical anterior/septal/inferior walls, and apex. 2. Mild LV dilatation with moderate systolic function (EF 09%). Akinesis of mid to apical anterior, mid anteroseptal, apical septal, apical inferior,  and apex. 3.  Normal RV size and systolic function (EF 47%) 4.  Moderate mitral regurgitation (regurgitant fraction 24%) 5.  Moderate right pleural effusion  Coronary angiography 11/14/2019: LM: Diffuse 30% disease. IVUS MLA 7.3 mm2,. dFR 1.0 LAD: Patent prox LAD stent with udner expansion        Optimization with 3.5X15 mm [place] balloon at 22 atm        Excellent expansion as seen on IVUS        TIMI flow II-->III        Diag: Mod diffuse disease LCx: 80% ostial OM1 stenosis. dFR 0.98 RCA: Not engaged today  Echocardiogram 10/24/2019:  Left ventricle cavity is mildly dilated. Distal anteroseptal and apical  akinesis. LV endocardial border not well visualized to accurately asses  EF, probably around 30%. Recommend alternate imaging modality to  accurately assess EF.  Doppler evidence of grade III (restrictive)  diastolic dysfunction.  Left atrial cavity is severely dilated.  Moderate (Grade III) mitral regurgitation.  Inadequate TR jet to estimate PASP. Estimated RA pressure 8 mmHg.  No significant change compared to previous study on 07/27/2019.   Left Heart Catheterization 07/26/19 (Dr. Einar Gip):  LM: Mild diffuse disease LAD: Very large caliber vessel, occluded at the proximal end.  Gives origin to high D1 which is large.  Successful PTCA and stenting of the proximal LAD overlapping D1, 3.5 x 24 mm Synergy DES deployed at 12 atmospheric pressure for 60 S. 100% to 0% with TIMI 0 to TIMI-3 flow. LCx: Large vessel, giving origin to a very large OM1 which is secondary branches.  Ostium of OM1 has 80% stenosis. RCA: Mild diffuse disease, distal RCA 15 to 20% stenosis, PDA 50% stenosis.  Recommendation: Patient will need aggressive risk factor modification, dual antiplatelet therapy with aspirin and Brilinta for a year, will be watched carefully in the intensive care unit for preshock, suspect his EDP is extremely high at the end of the procedure with excessive contrast use.  Will monitor for  any contrast nephropathy as well.  Recent labs: 02/19/2020: Glucose 90, BUN/Cr 21/1.04. EGFR 77. Na/K 136/4.0.  AST/ALT 43/55, AlKP 216 H/H 16/50. MCV 87. Platelets 258   11/02/2019: Glucose 85, BUN/Cr 15/1.18. EGFR 66. Na/K 138/4.7.  H/H 14/46. MCV 91. Platelets 222  08/17/2019: Glucose 90, BUN/Cr 17/1.21. EGFR 65. Na/K 136/4.9.  H/H 14/44. MCV 90. Platelets 289  07/26/2019: Glucose 122, BUN/Cr 9/0.9. EGFR 60. H/H 16.1/49.0. MCV 90.2. Platelets 240 HbA1C 5.5% Chol 230, TG 95, HDL 36, LDL 175   Review of Systems  Cardiovascular: Positive for dyspnea on exertion and orthopnea. Negative for chest pain, leg swelling, palpitations and syncope.  Gastrointestinal: Positive for diarrhea and melena.         Vitals:   03/19/20 0650  BP: (!) 96/47  Pulse: (!) 47  Resp: 15  Temp: 98.5 F (36.9 C)  SpO2: 95%     Body mass index is 36.78 kg/m. Filed Weights   03/19/20 0650  Weight: 117.9 kg     Objective:   Physical Exam Vitals and nursing note reviewed.  Constitutional:  General: He is not in acute distress. Neck:     Vascular: No JVD.  Cardiovascular:     Rate and Rhythm: Normal rate and regular rhythm.     Pulses: Intact distal pulses.          Dorsalis pedis pulses are 1+ on the right side and 1+ on the left side.       Posterior tibial pulses are 0 on the right side and 0 on the left side.     Heart sounds: Normal heart sounds. No murmur heard.   Pulmonary:     Effort: Pulmonary effort is normal.     Breath sounds: Normal breath sounds. No wheezing or rales.          Assessment & Recommendations:   59 y.o. 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 S/p primary PCI to culprit prox LAD (07/2019) dFR negative 80%  stenosis in OM1 (59741) LAD stent optimized under IVUS (10/2019) Continue Aspirin, Brilinta, Lipitor 80  Check lipid panel. If LDL >70, will add Repatha  Ischemic cardiomyopathy: >50% LGE in LAD territory. LVEF 36% Currently on metoprolol succinate 12.5 mg daily, Entesto 24-26 mg bid, spironolactone 25 mg daily. Lasix 40/20 mg alternate  PAD: He has lifestyle limiting claudication, in spite of medical therapy and cardiac rehab, now with pre-ulcerative calloses on right sole. Patient wants to pursue invasive angiography and possible intervention. Will proceed with lower extremity angiography, and intervention  Nigel Mormon, MD [facility] Pager: [phone] Office: [phone]

## 2020-03-19 NOTE — Progress Notes (Signed)
Discharge instructions reviewed with pt and his brother Trey Paula (via telephone) both voice understanding.

## 2020-03-19 NOTE — Discharge Instructions (Signed)
Angiogram, Care After This sheet gives you information about how to care for yourself after your procedure. Your health care provider may also give you more specific instructions. If you have problems or questions, contact your health care provider. What can I expect after the procedure? After the procedure, it is common to have bruising and tenderness at the catheter insertion area. Follow these instructions at home: Insertion site care  Follow instructions from your health care provider about how to take care of your insertion site. Make sure you: ? Wash your hands with soap and water before you change your bandage (dressing). If soap and water are not available, use hand sanitizer. ? Change your dressing as told by your health care provider. ? Leave stitches (sutures), skin glue, or adhesive strips in place. These skin closures may need to stay in place for 2 weeks or longer. If adhesive strip edges start to loosen and curl up, you may trim the loose edges. Do not remove adhesive strips completely unless your health care provider tells you to do that.  Do not take baths, swim, or use a hot tub until your health care provider approves.  You may shower 24-48 hours after the procedure or as told by your health care provider. ? Gently wash the site with plain soap and water. ? Pat the area dry with a clean towel. ? Do not rub the site. This may cause bleeding.  Do not apply powder or lotion to the site. Keep the site clean and dry.  Check your insertion site every day for signs of infection. Check for: ? Redness, swelling, or pain. ? Fluid or blood. ? Warmth. ? Pus or a bad smell. Activity  Rest as told by your health care provider, usually for 1-2 days.  Do not lift anything that is heavier than 10 lbs. (4.5 kg) or as told by your health care provider.  Do not drive for 24 hours if you were given a medicine to help you relax (sedative).  Do not drive or use heavy machinery while  taking prescription pain medicine. General instructions   Return to your normal activities as told by your health care provider, usually in about a week. Ask your health care provider what activities are safe for you.  If the catheter site starts bleeding, lie flat and put pressure on the site. If the bleeding does not stop, get help right away. This is a medical emergency.  Drink enough fluid to keep your urine clear or pale yellow. This helps flush the contrast dye from your body.  Take over-the-counter and prescription medicines only as told by your health care provider.  Keep all follow-up visits as told by your health care provider. This is important. Contact a health care provider if:  You have a fever or chills.  You have redness, swelling, or pain around your insertion site.  You have fluid or blood coming from your insertion site.  The insertion site feels warm to the touch.  You have pus or a bad smell coming from your insertion site.  You have bruising around the insertion site.  You notice blood collecting in the tissue around the catheter site (hematoma). The hematoma may be painful to the touch. Get help right away if:  You have severe pain at the catheter insertion area.  The catheter insertion area swells very fast.  The catheter insertion area is bleeding, and the bleeding does not stop when you hold steady pressure on the area.    The area near or just beyond the catheter insertion site becomes pale, cool, tingly, or numb. These symptoms may represent a serious problem that is an emergency. Do not wait to see if the symptoms will go away. Get medical help right away. Call your local emergency services (911 in the [place] Do not drive yourself to the hospital. Summary  After the procedure, it is common to have bruising and tenderness at the catheter insertion area.  After the procedure, it is important to rest and drink plenty of fluids.  Do not take baths,  swim, or use a hot tub until your health care provider says it is okay to do so. You may shower 24-48 hours after the procedure or as told by your health care provider.  If the catheter site starts bleeding, lie flat and put pressure on the site. If the bleeding does not stop, get help right away. This is a medical emergency. This information is not intended to replace advice given to you by your health care provider. Make sure you discuss any questions you have with your health care provider. Document Revised: 02/19/2017 Document Reviewed: 02/12/2016 Elsevier Patient Education  2020 [employer] Inc.  

## 2020-03-20 ENCOUNTER — Encounter (HOSPITAL_COMMUNITY): Payer: Self-pay | Admitting: Cardiology

## 2020-03-29 ENCOUNTER — Other Ambulatory Visit: Payer: Self-pay | Admitting: Physician Assistant

## 2020-03-29 ENCOUNTER — Other Ambulatory Visit: Payer: Self-pay | Admitting: Cardiology

## 2020-03-29 ENCOUNTER — Other Ambulatory Visit: Payer: Self-pay | Admitting: Emergency Medicine

## 2020-03-29 DIAGNOSIS — I5020 Unspecified systolic (congestive) heart failure: Secondary | ICD-10-CM

## 2020-03-29 DIAGNOSIS — E782 Mixed hyperlipidemia: Secondary | ICD-10-CM | POA: Insufficient documentation

## 2020-03-29 NOTE — Progress Notes (Signed)
Patient referred by Waldon Merl, PA-C for CAD  Subjective:   Jonathon Cain, male    DOB: 23-Aug-1960, 60 y.o.   MRN: [id number]   Chief Complaint  Patient presents with  . PAD    HEART FAILURE    . hFREF     HPI  60 y.o. 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 LLE claudication remains. He has an ingrown LLE 1st toenail with mild bleeding that occurred a few days ago.   He is enrolled in EMPACT-MI clinical trial.    Current Outpatient Medications on File Prior to Visit  Medication Sig Dispense Refill  . acetaminophen (TYLENOL) 325 MG tablet Take 2 tablets (650 mg total) by mouth every 4 (four) hours as needed for headache or mild pain.    [name] Kitchen aspirin 81 MG chewable tablet Chew 1 tablet (81 mg total) by mouth daily. 30 tablet 11  . atorvastatin (LIPITOR) 80 MG tablet Take 1 tablet (80 mg total) by mouth daily. 30 tablet 3  . cetirizine (ZYRTEC) 10 MG tablet Take 10 mg by mouth daily as needed for allergies.    [name] Kitchen ENTRESTO 24-26 MG TAKE 1 TABLET BY MOUTH TWICE DAILY 60 tablet 1  . fluticasone (FLONASE) 50 MCG/ACT nasal spray Place 2 sprays into both nostrils daily as needed for allergies or rhinitis.     . furosemide (LASIX) 40 MG tablet TAKE 1 TABLET(40 MG) BY MOUTH DAILY 90 tablet 1  . latanoprost (XALATAN) 0.005 % ophthalmic solution Place 1 drop into both eyes at bedtime.    . metoprolol succinate (TOPROL-XL) 25 MG 24 hr tablet TAKE 1/2 TABLET(12.5 MG) BY MOUTH DAILY (Patient taking differently: Take 12.5 mg by mouth daily.) 30 tablet 2  . nitroGLYCERIN (NITROSTAT) 0.4 MG SL tablet Place 1 tablet (0.4 mg total) under the tongue every  5 (five) minutes as needed for chest pain. 25 tablet 2  . pantoprazole (PROTONIX) 40 MG tablet TAKE 1 TABLET(40 MG) BY MOUTH DAILY (Patient taking differently: Take 40 mg by mouth daily.) 30 tablet 2  . spironolactone (ALDACTONE) 25 MG tablet Take 1 tablet (25 mg total) by mouth daily. 90 tablet 3  . tamsulosin (FLOMAX) 0.4 MG CAPS capsule Take 1 capsule by mouth daily as needed.    . ticagrelor (BRILINTA) 90 MG TABS tablet Take 1 tablet (90 mg total) by mouth 2 (two) times daily. 60 tablet 11  . timolol (TIMOPTIC) 0.5 % ophthalmic solution Place 1 drop into both eyes See admin instructions. Place 1 drop into both eyes two times a day for 2 months on/2 months off- alternate with Latanoprost     No current facility-administered medications on file prior to visit.    Cardiovascular and other pertinent studies:  Peripheral intervention 03/19/2020: Rt CIA 80% stenosis Lt CIA 95% stenosis Rt SFA short segment of CTO 3 vessel below the knee runoff Lt SFA short segment of CTO Occluded Lt ATA with 2 vessel below the knee runoff  PTA Rt SFA 5.0X80 mm In.Pact drug coated balloon PTA and stenting Rt CIA 9.0X40 mm Eluvia drug eluting stent PTA and stenting Rt CIA 10.0X40 mm Eluvia drug eluting stent  Will stage Lt SFA revascularization, if clinically indicated  ABI/LEA duplex 03/07/2020: Right: >50% stenosis of the right external iliac artery.  Occlusion of the distal SFA/above-knee popliteal area. Suspected occlusion  of the distal peroneal artery.   Left: Severe >50% stenosis of the left external iliac artery.  Segmental occlusion of the left distal SFA. Suspected occlusion of the  distal peroneal artery.   Right: Resting right ankle-brachial index indicates moderate right lower extremity arterial disease. ABI 0.75. The right toe-brachial index is abnormal.   Left: Resting left ankle-brachial index indicates moderate left lower extremity arterial disease. ABI 0.66. The left  toe-brachial index is abnormal.       EKG 02/02/2020: Junctional rhythm/einus bradycardia 42 bpm Left anterolateral infarct Left anterior fascicular block No ischemic  Cardiac MRI 02/01/2020: 1. Subendocardial late gadolinium enhancement consistent with prior infarct in LAD territory. There is >50% transmural LGE suggesting nonviability in mid anterior/anteroseptal walls, apical anterior/septal/inferior walls, and apex. 2. Mild LV dilatation with moderate systolic function (EF 36%). Akinesis of mid to apical anterior, mid anteroseptal, apical septal, apical inferior, and apex. 3.  Normal RV size and systolic function (EF 54%) 4.  Moderate mitral regurgitation (regurgitant fraction 24%) 5.  Moderate right pleural effusion  Coronary angiography 11/14/2019: LM: Diffuse 30% disease. IVUS MLA 7.3 mm2,. dFR 1.0 LAD: Patent prox LAD stent with udner expansion        Optimization with 3.5X15 mm [place] balloon at 22 atm        Excellent expansion as seen on IVUS        TIMI flow II-->III        Diag: Mod diffuse disease LCx: 80% ostial OM1 stenosis. dFR 0.98 RCA: Not engaged today  Echocardiogram 10/24/2019:  Left ventricle cavity is mildly dilated. Distal anteroseptal and apical  akinesis. LV endocardial border not well visualized to accurately asses  EF, probably around 30%. Recommend alternate imaging modality to  accurately assess EF.  Doppler evidence of grade III (restrictive)  diastolic dysfunction.  Left atrial cavity is severely dilated.  Moderate (Grade III) mitral regurgitation.  Inadequate TR jet to estimate PASP. Estimated RA pressure 8 mmHg.  No significant change compared to previous study on 07/27/2019.   Left Heart Catheterization 07/26/19 (Dr. Jacinto Halim):  LM: Mild diffuse disease LAD: Very large caliber vessel, occluded at the proximal end.  Gives origin to high D1 which is large.  Successful PTCA and stenting of the proximal LAD overlapping D1, 3.5 x 24 mm Synergy DES  deployed at 12 atmospheric pressure for 60 S. 100% to 0% with TIMI 0 to TIMI-3 flow. LCx: Large vessel, giving origin to a very large OM1 which is secondary branches.  Ostium of OM1 has 80% stenosis. RCA: Mild diffuse disease, distal RCA 15 to 20% stenosis, PDA 50% stenosis.  Recommendation: Patient will need aggressive risk factor modification, dual antiplatelet therapy with aspirin and Brilinta for a year, will be watched carefully in the intensive care unit for preshock, suspect his EDP is extremely high at the end of the procedure with excessive contrast use.  Will monitor for any contrast nephropathy as well.  Recent labs: 02/19/2020: Glucose 90, BUN/Cr 21/1.04. EGFR 77. Na/K 136/4.0.  AST/ALT 43/55, AlKP 216 H/H 16/50. MCV 87. Platelets 258   11/02/2019: Glucose 85, BUN/Cr 15/1.18. EGFR 66. Na/K 138/4.7.  H/H 14/46. MCV 91. Platelets 222  08/17/2019: Glucose 90, BUN/Cr 17/1.21. EGFR 65. Na/K 136/4.9.  H/H 14/44. MCV 90. Platelets 289  07/26/2019: Glucose 122, BUN/Cr 9/0.9. EGFR 60. H/H 16.1/49.0. MCV 90.2. Platelets 240 HbA1C 5.5%  Chol 230, TG 95, HDL 36, LDL 175   Review of Systems  Cardiovascular: Positive for dyspnea on exertion and orthopnea. Negative for chest pain, leg swelling, palpitations and syncope.  Gastrointestinal: Positive for diarrhea and melena.         Vitals:   04/01/20 1506  BP: (!) 101/54  Pulse: (!) 48  SpO2: 95%     Body mass index is 37.49 kg/m. Filed Weights   04/01/20 1506  Weight: 265 lb (120.2 kg)     Objective:   Physical Exam Vitals and nursing note reviewed.  Constitutional:      General: He is not in acute distress. Neck:     Vascular: No JVD.  Cardiovascular:     Rate and Rhythm: Normal rate and regular rhythm.     Pulses: Intact distal pulses.          Dorsalis pedis pulses are 1+ on the right side and 1+ on the left side.       Posterior tibial pulses are 0 on the right side and 0 on the left side.     Heart sounds:  Normal heart sounds. No murmur heard.   Pulmonary:     Effort: Pulmonary effort is normal.     Breath sounds: Normal breath sounds. No wheezing or rales.          Assessment & Recommendations:   60 y.o. 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 Left foor 1st toenail is ingrown with superficial injury. Monitor closely. If this does not heal well in next 10-14 days, will consider Lt SFA intervention sooner. Otherwise, will stage it to be performed after current COVID surge is subsided. Continue DAPT and risk factor modification  CAD: No angina symptoms. S/p primary PCI to culprit prox LAD (07/2019) dFR negative 80% stenosis in OM1 (86578) LAD stent optimized under IVUS (10/2019) Continue Aspirin, Brilinta, Lipitor 80  Check lipid panel. If LDL >70, will add Repatha  Ischemic cardiomyopathy: >50% LGE in LAD territory. LVEF 36% Currently on metoprolol succinate 12.5 mg daily, Entesto 24-26 mg bid, spironolactone 25 mg daily. Lasix 40/20 mg alternate  F/u in 1-2 weeks  Elder Negus, MD [facility] Pager: [phone] Office: [phone]

## 2020-04-01 ENCOUNTER — Ambulatory Visit: Payer: Medicare HMO | Admitting: Cardiology

## 2020-04-01 ENCOUNTER — Other Ambulatory Visit: Payer: Self-pay

## 2020-04-01 ENCOUNTER — Encounter: Payer: Self-pay | Admitting: Cardiology

## 2020-04-01 VITALS — BP 101/54 | HR 48 | Wt 265.0 lb

## 2020-04-01 DIAGNOSIS — Z006 Encounter for examination for normal comparison and control in clinical research program: Secondary | ICD-10-CM

## 2020-04-01 DIAGNOSIS — I2510 Atherosclerotic heart disease of native coronary artery without angina pectoris: Secondary | ICD-10-CM

## 2020-04-01 DIAGNOSIS — I739 Peripheral vascular disease, unspecified: Secondary | ICD-10-CM

## 2020-04-01 DIAGNOSIS — E782 Mixed hyperlipidemia: Secondary | ICD-10-CM

## 2020-04-10 ENCOUNTER — Ambulatory Visit: Payer: Medicare HMO | Admitting: Cardiology

## 2020-04-10 ENCOUNTER — Other Ambulatory Visit: Payer: Medicare HMO

## 2020-04-16 ENCOUNTER — Ambulatory Visit: Payer: Medicare HMO | Admitting: Cardiovascular Disease

## 2020-04-17 ENCOUNTER — Ambulatory Visit: Payer: Medicare HMO

## 2020-04-17 ENCOUNTER — Other Ambulatory Visit: Payer: Self-pay

## 2020-04-17 DIAGNOSIS — I739 Peripheral vascular disease, unspecified: Secondary | ICD-10-CM

## 2020-04-18 ENCOUNTER — Ambulatory Visit: Payer: Medicare HMO | Admitting: Cardiology

## 2020-04-18 NOTE — Progress Notes (Incomplete)
Patient referred by Brunetta Jeans, PA-C for CAD  Subjective:   Jonathon Cain, male    DOB: 23-Dec-1960, 60 y.o.   MRN: [id number]  *** No chief complaint on file.    HPI  60 y.o. 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 LLE claudication remains. He has an ingrown LLE 1st toenail with mild bleeding that occurred a few days ago.   ***Reviewed recent ABI, increase in Rt from 0.75-->1.0, Lt from 0.66-->0.75.  He is enrolled in EMPACT-MI clinical trial.    Current Outpatient Medications on File Prior to Visit  Medication Sig Dispense Refill  . acetaminophen (TYLENOL) 325 MG tablet Take 2 tablets (650 mg total) by mouth every 4 (four) hours as needed for headache or mild pain.    [name] Kitchen aspirin 81 MG chewable tablet Chew 1 tablet (81 mg total) by mouth daily. 30 tablet 11  . atorvastatin (LIPITOR) 80 MG tablet Take 1 tablet (80 mg total) by mouth daily. 30 tablet 3  . cetirizine (ZYRTEC) 10 MG tablet Take 10 mg by mouth daily as needed for allergies.    [name] Kitchen ENTRESTO 24-26 MG TAKE 1 TABLET BY MOUTH TWICE DAILY 60 tablet 1  . fluticasone (FLONASE) 50 MCG/ACT nasal spray Place 2 sprays into both nostrils daily as needed for allergies or rhinitis.     . furosemide (LASIX) 40 MG tablet TAKE 1 TABLET(40 MG) BY MOUTH DAILY 90 tablet 1  . latanoprost (XALATAN) 0.005 % ophthalmic solution Place 1 drop into both eyes at bedtime.    . metoprolol succinate (TOPROL-XL) 25 MG 24 hr tablet TAKE 1/2 TABLET(12.5 MG) BY MOUTH DAILY (Patient taking differently: Take 12.5 mg by mouth daily.) 30 tablet 2  . nitroGLYCERIN (NITROSTAT) 0.4 MG SL tablet Place 1 tablet (0.4  mg total) under the tongue every 5 (five) minutes as needed for chest pain. 25 tablet 2  . pantoprazole (PROTONIX) 40 MG tablet TAKE 1 TABLET(40 MG) BY MOUTH DAILY (Patient taking differently: Take 40 mg by mouth daily.) 30 tablet 2  . spironolactone (ALDACTONE) 25 MG tablet TAKE 1 TABLET(25 MG) BY MOUTH DAILY 90 tablet 1  . tamsulosin (FLOMAX) 0.4 MG CAPS capsule Take 1 capsule by mouth daily as needed.    . ticagrelor (BRILINTA) 90 MG TABS tablet Take 1 tablet (90 mg total) by mouth 2 (two) times daily. 60 tablet 11  . timolol (TIMOPTIC) 0.5 % ophthalmic solution Place 1 drop into both eyes See admin instructions. Place 1 drop into both eyes two times a day for 2 months on/2 months off- alternate with Latanoprost     No current facility-administered medications on file prior to visit.    Cardiovascular and other pertinent studies:  Peripheral intervention 03/19/2020: Rt CIA 80% stenosis Lt CIA 95% stenosis Rt SFA short segment of CTO 3 vessel below the knee runoff Lt SFA short segment of CTO Occluded Lt ATA with 2 vessel below the knee runoff  PTA Rt SFA 5.0X80 mm In.Pact drug coated balloon PTA and stenting Rt CIA 9.0X40 mm Eluvia drug eluting stent PTA and stenting Rt CIA 10.0X40 mm Eluvia drug eluting stent  Will stage Lt SFA revascularization, if clinically indicated   ABI/LEA duplex 03/07/2020:  Right: >50% stenosis of the right external iliac artery.  Occlusion of the distal SFA/above-knee popliteal area. Suspected occlusion  of the distal peroneal artery.   Left: Severe >50% stenosis of the left external iliac artery.  Segmental occlusion of the left distal SFA. Suspected occlusion of the  distal peroneal artery.   Right: Resting right ankle-brachial index indicates moderate right lower extremity arterial disease. ABI 0.75. The right toe-brachial index is abnormal.   Left: Resting left ankle-brachial index indicates moderate left lower extremity arterial disease. ABI  0.66. The left toe-brachial index is abnormal.       EKG 02/02/2020: Junctional rhythm/einus bradycardia 42 bpm Left anterolateral infarct Left anterior fascicular block No ischemic  Cardiac MRI 02/01/2020: 1. Subendocardial late gadolinium enhancement consistent with prior infarct in LAD territory. There is >50% transmural LGE suggesting nonviability in mid anterior/anteroseptal walls, apical anterior/septal/inferior walls, and apex. 2. Mild LV dilatation with moderate systolic function (EF 16%). Akinesis of mid to apical anterior, mid anteroseptal, apical septal, apical inferior, and apex. 3.  Normal RV size and systolic function (EF 10%) 4.  Moderate mitral regurgitation (regurgitant fraction 24%) 5.  Moderate right pleural effusion  Coronary angiography 11/14/2019: LM: Diffuse 30% disease. IVUS MLA 7.3 mm2,. dFR 1.0 LAD: Patent prox LAD stent with udner expansion        Optimization with 3.5X15 mm [place] balloon at 22 atm        Excellent expansion as seen on IVUS        TIMI flow II-->III        Diag: Mod diffuse disease LCx: 80% ostial OM1 stenosis. dFR 0.98 RCA: Not engaged today  Echocardiogram 10/24/2019:  Left ventricle cavity is mildly dilated. Distal anteroseptal and apical  akinesis. LV endocardial border not well visualized to accurately asses  EF, probably around 30%. Recommend alternate imaging modality to  accurately assess EF.  Doppler evidence of grade III (restrictive)  diastolic dysfunction.  Left atrial cavity is severely dilated.  Moderate (Grade III) mitral regurgitation.  Inadequate TR jet to estimate PASP. Estimated RA pressure 8 mmHg.  No significant change compared to previous study on 07/27/2019.   Left Heart Catheterization 07/26/19 (Dr. Einar Gip):  LM: Mild diffuse disease LAD: Very large caliber vessel, occluded at the proximal end.  Gives origin to high D1 which is large.  Successful PTCA and stenting of the proximal LAD overlapping D1, 3.5 x 24  mm Synergy DES deployed at 12 atmospheric pressure for 60 S. 100% to 0% with TIMI 0 to TIMI-3 flow. LCx: Large vessel, giving origin to a very large OM1 which is secondary branches.  Ostium of OM1 has 80% stenosis. RCA: Mild diffuse disease, distal RCA 15 to 20% stenosis, PDA 50% stenosis.  Recommendation: Patient will need aggressive risk factor modification, dual antiplatelet therapy with aspirin and Brilinta for a year, will be watched carefully in the intensive care unit for preshock, suspect his EDP is extremely high at the end of the procedure with excessive contrast use.  Will monitor for any contrast nephropathy as well.  Recent labs: 02/19/2020: Glucose 90, BUN/Cr 21/1.04. EGFR 77. Na/K 136/4.0.  AST/ALT 43/55, AlKP 216 H/H 16/50. MCV 87. Platelets 258   11/02/2019: Glucose 85, BUN/Cr 15/1.18. EGFR 66. Na/K 138/4.7.  H/H 14/46. MCV 91. Platelets 222  08/17/2019: Glucose 90, BUN/Cr 17/1.21. EGFR 65. Na/K 136/4.9.  H/H 14/44. MCV 90. Platelets 289  07/26/2019: Glucose 122, BUN/Cr 9/0.9. EGFR 60. H/H 16.1/49.0. MCV 90.2. Platelets 240 HbA1C 5.5% Chol 230, TG  95, HDL 36, LDL 175   Review of Systems  Cardiovascular: Positive for dyspnea on exertion and orthopnea. Negative for chest pain, leg swelling, palpitations and syncope.  Gastrointestinal: Positive for diarrhea and melena.        *** There were no vitals filed for this visit.  *** There is no height or weight on file to calculate BMI. There were no vitals filed for this visit.   Objective:   Physical Exam Vitals and nursing note reviewed.  Constitutional:      General: He is not in acute distress. Neck:     Vascular: No JVD.  Cardiovascular:     Rate and Rhythm: Normal rate and regular rhythm.     Pulses: Intact distal pulses.          Dorsalis pedis pulses are 1+ on the right side and 1+ on the left side.       Posterior tibial pulses are 0 on the right side and 0 on the left side.     Heart sounds:  Normal heart sounds. No murmur heard.   Pulmonary:     Effort: Pulmonary effort is normal.     Breath sounds: Normal breath sounds. No wheezing or rales.          Assessment & Recommendations:   60 y.o. 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 ***Left foor 1st toenail is ingrown with superficial injury. Monitor closely. If this does not heal well in next 10-14 days, will consider Lt SFA intervention sooner. Otherwise, will stage it to be performed after current COVID surge is subsided. Continue DAPT and risk factor modification  CAD: No angina symptoms. S/p primary PCI to culprit prox LAD (07/2019) dFR negative 80% stenosis in OM1 (34742) LAD stent optimized under IVUS (10/2019) Continue Aspirin, Brilinta, Lipitor 80  Check lipid panel. If LDL >70, will add Repatha  Ischemic cardiomyopathy: >50% LGE in LAD territory. LVEF 36% Currently on metoprolol succinate 12.5 mg daily, Entesto 24-26 mg bid, spironolactone 25 mg daily. Lasix 40/20 mg alternate  F/u in 1-2 weeks  Nigel Mormon, MD [facility] Pager: [phone] Office: [phone]

## 2020-04-20 ENCOUNTER — Other Ambulatory Visit: Payer: Self-pay | Admitting: Cardiology

## 2020-04-20 DIAGNOSIS — I25118 Atherosclerotic heart disease of native coronary artery with other forms of angina pectoris: Secondary | ICD-10-CM

## 2020-04-20 LAB — LIPID PANEL
Chol/HDL Ratio: 3 ratio (ref 0.0–5.0)
Cholesterol, Total: 119 mg/dL (ref 100–199)
HDL: 40 mg/dL (ref >39)
LDL Chol Calc (NIH): 64 mg/dL (ref 0–99)
Triglycerides: 75 mg/dL (ref 0–149)
VLDL Cholesterol Cal: 15 mg/dL (ref 5–40)

## 2020-04-26 ENCOUNTER — Encounter: Payer: Self-pay | Admitting: Cardiology

## 2020-04-26 ENCOUNTER — Ambulatory Visit: Payer: Medicare HMO | Admitting: Cardiology

## 2020-04-26 ENCOUNTER — Other Ambulatory Visit: Payer: Self-pay

## 2020-04-26 VITALS — BP 105/71 | HR 54 | Temp 98.0°F | Resp 16 | Ht 70.0 in | Wt 269.2 lb

## 2020-04-26 DIAGNOSIS — I5020 Unspecified systolic (congestive) heart failure: Secondary | ICD-10-CM

## 2020-04-26 DIAGNOSIS — I2510 Atherosclerotic heart disease of native coronary artery without angina pectoris: Secondary | ICD-10-CM | POA: Diagnosis not present

## 2020-04-26 DIAGNOSIS — E782 Mixed hyperlipidemia: Secondary | ICD-10-CM

## 2020-04-26 DIAGNOSIS — I739 Peripheral vascular disease, unspecified: Secondary | ICD-10-CM | POA: Diagnosis not present

## 2020-04-26 NOTE — Progress Notes (Signed)
Patient referred by Brunetta Jeans, PA-C for CAD  Subjective:   Jonathon Cain, male    DOB: 06/19/1960, 60 y.o.   MRN: [id number]   Chief Complaint  Patient presents with  . Follow-up     HPI  60 y.o. 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 ABI improved, details below. LDL down from 175 to 64.   He still has LLE claudication. He also has an area of ingrown toenail that appears dark, discolored. This has not completely healed in last month or so. He has upcoming appt with podiatrist next week.   He is enrolled in EMPACT-MI clinical trial.    Current Outpatient Medications on File Prior to Visit  Medication Sig Dispense Refill  . acetaminophen (TYLENOL) 325 MG tablet Take 2 tablets (650 mg total) by mouth every 4 (four) hours as needed for headache or mild pain.    [name] Kitchen aspirin 81 MG chewable tablet Chew 1 tablet (81 mg total) by mouth daily. 30 tablet 11  . atorvastatin (LIPITOR) 80 MG tablet Take 1 tablet (80 mg total) by mouth daily. 30 tablet 3  . cetirizine (ZYRTEC) 10 MG tablet Take 10 mg by mouth daily as needed for allergies.    [name] Kitchen ENTRESTO 24-26 MG TAKE 1 TABLET BY MOUTH TWICE DAILY 60 tablet 1  . fluticasone (FLONASE) 50 MCG/ACT nasal spray Place 2 sprays into both nostrils daily as needed for allergies or rhinitis.     . furosemide (LASIX) 40 MG tablet TAKE 1 TABLET(40 MG) BY MOUTH DAILY 90 tablet 1  . latanoprost (XALATAN) 0.005 % ophthalmic solution Place 1 drop into both eyes at bedtime.    . metoprolol succinate (TOPROL-XL) 25 MG 24 hr tablet TAKE 1/2 TABLET(12.5 MG) BY MOUTH DAILY (Patient taking differently: Take 12.5 mg by  mouth daily.) 30 tablet 2  . nitroGLYCERIN (NITROSTAT) 0.4 MG SL tablet Place 1 tablet (0.4 mg total) under the tongue every 5 (five) minutes as needed for chest pain. 25 tablet 2  . pantoprazole (PROTONIX) 40 MG tablet TAKE 1 TABLET(40 MG) BY MOUTH DAILY 30 tablet 2  . spironolactone (ALDACTONE) 25 MG tablet TAKE 1 TABLET(25 MG) BY MOUTH DAILY 90 tablet 1  . tamsulosin (FLOMAX) 0.4 MG CAPS capsule Take 1 capsule by mouth daily as needed.    . ticagrelor (BRILINTA) 90 MG TABS tablet Take 1 tablet (90 mg total) by mouth 2 (two) times daily. 60 tablet 11  . timolol (TIMOPTIC) 0.5 % ophthalmic solution Place 1 drop into both eyes See admin instructions. Place 1 drop into both eyes two times a day for 2 months on/2 months off- alternate with Latanoprost     No current facility-administered medications on file prior to visit.    Cardiovascular and other pertinent studies:  ABI 04/17/2020:  This exam reveals normal perfusion of the right lower extremity (ABI  1.00). There is mildly abnormal biphasic waveform at the right ankle.  This exam reveals moderately decreased perfusion of the left lower  extremity, noted at the post tibial artery level (ABI 0.75). Severely  abnormal monophasic waveform at the left ankle.  Compared to 03/07/2020, right ABI 0.75 and left ABI 0.66.  Peripheral intervention 03/19/2020: Rt  CIA 80% stenosis Lt CIA 95% stenosis Rt SFA short segment of CTO 3 vessel below the knee runoff Lt SFA short segment of CTO Occluded Lt ATA with 2 vessel below the knee runoff  PTA Rt SFA 5.0X80 mm In.Pact drug coated balloon PTA and stenting Rt CIA 9.0X40 mm Eluvia drug eluting stent PTA and stenting Rt CIA 10.0X40 mm Eluvia drug eluting stent  Will stage Lt SFA revascularization, if clinically indicated  EKG 02/02/2020: Junctional rhythm/einus bradycardia 42 bpm Left anterolateral infarct Left anterior fascicular block No ischemic  Cardiac MRI 02/01/2020: 1. Subendocardial  late gadolinium enhancement consistent with prior infarct in LAD territory. There is >50% transmural LGE suggesting nonviability in mid anterior/anteroseptal walls, apical anterior/septal/inferior walls, and apex. 2. Mild LV dilatation with moderate systolic function (EF 81%). Akinesis of mid to apical anterior, mid anteroseptal, apical septal, apical inferior, and apex. 3.  Normal RV size and systolic function (EF 77%) 4.  Moderate mitral regurgitation (regurgitant fraction 24%) 5.  Moderate right pleural effusion  Coronary angiography 11/14/2019: LM: Diffuse 30% disease. IVUS MLA 7.3 mm2,. dFR 1.0 LAD: Patent prox LAD stent with udner expansion        Optimization with 3.5X15 mm [place] balloon at 22 atm        Excellent expansion as seen on IVUS        TIMI flow II-->III        Diag: Mod diffuse disease LCx: 80% ostial OM1 stenosis. dFR 0.98 RCA: Not engaged today  Echocardiogram 10/24/2019:  Left ventricle cavity is mildly dilated. Distal anteroseptal and apical  akinesis. LV endocardial border not well visualized to accurately asses  EF, probably around 30%. Recommend alternate imaging modality to  accurately assess EF.  Doppler evidence of grade III (restrictive)  diastolic dysfunction.  Left atrial cavity is severely dilated.  Moderate (Grade III) mitral regurgitation.  Inadequate TR jet to estimate PASP. Estimated RA pressure 8 mmHg.  No significant change compared to previous study on 07/27/2019.   Left Heart Catheterization 07/26/19 (Dr. Einar Gip):  LM: Mild diffuse disease LAD: Very large caliber vessel, occluded at the proximal end.  Gives origin to high D1 which is large.  Successful PTCA and stenting of the proximal LAD overlapping D1, 3.5 x 24 mm Synergy DES deployed at 12 atmospheric pressure for 60 S. 100% to 0% with TIMI 0 to TIMI-3 flow. LCx: Large vessel, giving origin to a very large OM1 which is secondary branches.  Ostium of OM1 has 80% stenosis. RCA: Mild diffuse  disease, distal RCA 15 to 20% stenosis, PDA 50% stenosis.  Recommendation: Patient will need aggressive risk factor modification, dual antiplatelet therapy with aspirin and Brilinta for a year, will be watched carefully in the intensive care unit for preshock, suspect his EDP is extremely high at the end of the procedure with excessive contrast use.  Will monitor for any contrast nephropathy as well.  Recent labs: 04/19/2020: Chol 119, TG 75, HDL 40, LDL 64  02/19/2020: Glucose 90, BUN/Cr 21/1.04. EGFR 77. Na/K 136/4.0.  AST/ALT 43/55, AlKP 216 H/H 16/50. MCV 87. Platelets 258   11/02/2019: Glucose 85, BUN/Cr 15/1.18. EGFR 66. Na/K 138/4.7.  H/H 14/46. MCV 91. Platelets 222  08/17/2019: Glucose 90, BUN/Cr 17/1.21. EGFR 65. Na/K 136/4.9.  H/H 14/44. MCV 90. Platelets 289  07/26/2019: Glucose 122, BUN/Cr 9/0.9. EGFR 60. H/H 16.1/49.0. MCV 90.2. Platelets 240 HbA1C 5.5% Chol 230, TG 95, HDL 36, LDL 175   Review of Systems  Cardiovascular: Negative for chest pain,  dyspnea on exertion, leg swelling, orthopnea, palpitations and syncope.  Gastrointestinal: Positive for diarrhea and melena.         Vitals:   04/26/20 1002  BP: 105/71  Pulse: (!) 54  Resp: 16  Temp: 98 F (36.7 C)  SpO2: 96%    Body mass index is 38.63 kg/m. Filed Weights   04/26/20 1002  Weight: 269 lb 3.2 oz (122.1 kg)     Objective:   Physical Exam Vitals and nursing note reviewed.  Constitutional:      General: He is not in acute distress. Neck:     Vascular: No JVD.  Cardiovascular:     Rate and Rhythm: Normal rate and regular rhythm.     Pulses: Intact distal pulses.          Dorsalis pedis pulses are 1+ on the right side and 0 on the left side.       Posterior tibial pulses are 0 on the right side and 0 on the left side.     Heart sounds: Normal heart sounds. No murmur heard.     Comments: Left 1st toe with dark discoloration along medial nailbed Pulmonary:     Effort: Pulmonary effort  is normal.     Breath sounds: Normal breath sounds. No wheezing or rales.  Musculoskeletal:     Right lower leg: No edema.     Left lower leg: No edema.          Assessment & Recommendations:   60 y.o. 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 Left foor 1st toenail is ingrown with superficial injury that has not healed completely. This could represent ealry critical limb ischemia. Monitor closely. F/u w/podiatry. Will plan on performing Lt SFA intervention. He wants to do it later this month. He knows to contact me if there is any significant worsening of his wound, before then. Continue DAPT and risk factor modification  CAD: No angina symptoms. S/p primary PCI to culprit prox LAD (07/2019) dFR negative 80% stenosis in OM1 (83870) LAD stent optimized under IVUS (10/2019) Continue Aspirin, Brilinta, Lipitor 80. LDL down to 64.  Ischemic cardiomyopathy: >50% LGE in LAD territory. LVEF 36% Currently on metoprolol succinate 12.5 mg daily, Entesto 24-26 mg bid, spironolactone 25 mg daily. Lasix 40/20 mg alternate  F/u in 4 weeks  Fani Rotondo Esther Hardy, MD [facility] Pager: [phone] Office: [phone]

## 2020-04-27 ENCOUNTER — Other Ambulatory Visit: Payer: Self-pay | Admitting: Cardiology

## 2020-05-02 ENCOUNTER — Ambulatory Visit: Payer: Medicare HMO | Admitting: Podiatry

## 2020-05-02 ENCOUNTER — Other Ambulatory Visit: Payer: Self-pay

## 2020-05-02 ENCOUNTER — Encounter: Payer: Self-pay | Admitting: Podiatry

## 2020-05-02 DIAGNOSIS — L84 Corns and callosities: Secondary | ICD-10-CM | POA: Diagnosis not present

## 2020-05-02 DIAGNOSIS — L600 Ingrowing nail: Secondary | ICD-10-CM

## 2020-05-02 DIAGNOSIS — R52 Pain, unspecified: Secondary | ICD-10-CM | POA: Diagnosis not present

## 2020-05-02 DIAGNOSIS — I739 Peripheral vascular disease, unspecified: Secondary | ICD-10-CM

## 2020-05-02 NOTE — Progress Notes (Signed)
  Subjective:  Patient ID: Jonathon Cain, male    DOB: 1960/08/18,  MRN: [id number]  Chief Complaint  Patient presents with  . Callouses    PT stated that he is doing better he has no major concerns at this time.    60 y.o. male returns with the above complaint. History confirmed with patient. He had stenting done on the right LE recently with Dr Einar Gip, the left side is planned for Feb 28. He has an ingrowing toenail on the left side.  Objective:  Physical Exam: warm, good capillary refill, DP palpable right, no trophic changes or ulcerative lesions, normal sensory exam and PT palpable  right. Reduced on LE  Right Foot: callus plantar hallux   Left foot: ingrowing toenail w/o paronychia  Assessment:   1. Pre-ulcerative calluses   2. Peripheral arterial disease (HCC)   3. Pain   4. Ingrowing left great toenail      Plan:  Patient was evaluated and treated and all questions answered.  All symptomatic hyperkeratoses were safely debrided with a sterile #15 blade to patient's level of comfort without incident. We discussed preventative and palliative care of these lesions including supportive and accommodative shoegear, padding, prefabricated and custom molded accommodative orthoses, use of a pumice stone and lotions/creams daily.  Significant improvement s/p stenting of RLE. He has the LLE planned soon. He would benefit from permanent partial nail avulsion, he should see me following revascularization for the procedure.  No follow-ups on file.

## 2020-05-17 ENCOUNTER — Other Ambulatory Visit (HOSPITAL_COMMUNITY)
Admission: RE | Admit: 2020-05-17 | Discharge: 2020-05-17 | Disposition: A | Discharge status: Home / Self Care | Payer: Medicare HMO | Source: Ambulatory Visit | Attending: Cardiology | Admitting: Cardiology

## 2020-05-17 DIAGNOSIS — Z01812 Encounter for preprocedural laboratory examination: Secondary | ICD-10-CM | POA: Insufficient documentation

## 2020-05-17 DIAGNOSIS — Z20822 Contact with and (suspected) exposure to covid-19: Secondary | ICD-10-CM | POA: Diagnosis not present

## 2020-05-17 DIAGNOSIS — I2510 Atherosclerotic heart disease of native coronary artery without angina pectoris: Secondary | ICD-10-CM | POA: Diagnosis not present

## 2020-05-17 DIAGNOSIS — I739 Peripheral vascular disease, unspecified: Secondary | ICD-10-CM | POA: Diagnosis not present

## 2020-05-17 LAB — SARS CORONAVIRUS 2 (TAT 6-24 HRS): SARS Coronavirus 2: NEGATIVE

## 2020-05-18 LAB — CBC
Hematocrit: 46.8 % (ref 37.5–51.0)
Hemoglobin: 15.7 g/dL (ref 13.0–17.7)
MCH: 30 pg (ref 26.6–33.0)
MCHC: 33.5 g/dL (ref 31.5–35.7)
MCV: 89 fL (ref 79–97)
Platelets: 241 10*3/uL (ref 150–450)
RBC: 5.24 x10E6/uL (ref 4.14–5.80)
RDW: 13.5 % (ref 11.6–15.4)
WBC: 16.4 10*3/uL — ABNORMAL HIGH (ref 3.4–10.8)

## 2020-05-18 LAB — BASIC METABOLIC PANEL
BUN/Creatinine Ratio: 25 — ABNORMAL HIGH (ref 10–24)
BUN: 24 mg/dL (ref 8–27)
CO2: 19 mmol/L — ABNORMAL LOW (ref 20–29)
Calcium: 9 mg/dL (ref 8.6–10.2)
Chloride: 101 mmol/L (ref 96–106)
Creatinine, Ser: 0.95 mg/dL (ref 0.76–1.27)
GFR calc Af Amer: 99 mL/min/{1.73_m2} (ref >59)
GFR calc non Af Amer: 86 mL/min/{1.73_m2} (ref >59)
Glucose: 86 mg/dL (ref 65–99)
Potassium: 5 mmol/L (ref 3.5–5.2)
Sodium: 133 mmol/L — ABNORMAL LOW (ref 134–144)

## 2020-05-20 DIAGNOSIS — I70229 Atherosclerosis of native arteries of extremities with rest pain, unspecified extremity: Secondary | ICD-10-CM

## 2020-05-21 ENCOUNTER — Encounter (HOSPITAL_COMMUNITY)
Admission: RE | Disposition: A | Discharge status: Home / Self Care | Payer: Self-pay | Source: Home / Self Care | Attending: Cardiology

## 2020-05-21 ENCOUNTER — Encounter (HOSPITAL_COMMUNITY): Payer: Self-pay | Admitting: Cardiology

## 2020-05-21 ENCOUNTER — Ambulatory Visit (HOSPITAL_COMMUNITY)
Admission: RE | Admit: 2020-05-21 | Discharge: 2020-05-21 | Disposition: A | Discharge status: Home / Self Care | Payer: Medicare HMO | Attending: Cardiology | Admitting: Cardiology

## 2020-05-21 ENCOUNTER — Other Ambulatory Visit: Payer: Self-pay

## 2020-05-21 DIAGNOSIS — I70211 Atherosclerosis of native arteries of extremities with intermittent claudication, right leg: Secondary | ICD-10-CM | POA: Diagnosis not present

## 2020-05-21 DIAGNOSIS — I70229 Atherosclerosis of native arteries of extremities with rest pain, unspecified extremity: Secondary | ICD-10-CM

## 2020-05-21 DIAGNOSIS — E785 Hyperlipidemia, unspecified: Secondary | ICD-10-CM | POA: Diagnosis not present

## 2020-05-21 DIAGNOSIS — F17200 Nicotine dependence, unspecified, uncomplicated: Secondary | ICD-10-CM | POA: Insufficient documentation

## 2020-05-21 DIAGNOSIS — I252 Old myocardial infarction: Secondary | ICD-10-CM | POA: Insufficient documentation

## 2020-05-21 DIAGNOSIS — I2510 Atherosclerotic heart disease of native coronary artery without angina pectoris: Secondary | ICD-10-CM | POA: Insufficient documentation

## 2020-05-21 DIAGNOSIS — L608 Other nail disorders: Secondary | ICD-10-CM | POA: Diagnosis not present

## 2020-05-21 DIAGNOSIS — Z95828 Presence of other vascular implants and grafts: Secondary | ICD-10-CM | POA: Diagnosis not present

## 2020-05-21 DIAGNOSIS — Z6838 Body mass index (BMI) 38.0-38.9, adult: Secondary | ICD-10-CM | POA: Insufficient documentation

## 2020-05-21 DIAGNOSIS — I739 Peripheral vascular disease, unspecified: Secondary | ICD-10-CM

## 2020-05-21 DIAGNOSIS — H3552 Pigmentary retinal dystrophy: Secondary | ICD-10-CM | POA: Diagnosis not present

## 2020-05-21 DIAGNOSIS — Z7982 Long term (current) use of aspirin: Secondary | ICD-10-CM | POA: Insufficient documentation

## 2020-05-21 DIAGNOSIS — I70244 Atherosclerosis of native arteries of left leg with ulceration of heel and midfoot: Secondary | ICD-10-CM | POA: Diagnosis not present

## 2020-05-21 DIAGNOSIS — H548 Legal blindness, as defined in USA: Secondary | ICD-10-CM | POA: Diagnosis not present

## 2020-05-21 DIAGNOSIS — Z79899 Other long term (current) drug therapy: Secondary | ICD-10-CM | POA: Insufficient documentation

## 2020-05-21 HISTORY — PX: LOWER EXTREMITY ANGIOGRAPHY: CATH118251

## 2020-05-21 HISTORY — PX: PERIPHERAL VASCULAR BALLOON ANGIOPLASTY: CATH118281

## 2020-05-21 LAB — POCT ACTIVATED CLOTTING TIME
Activated Clotting Time: 237 seconds
Activated Clotting Time: 237 seconds

## 2020-05-21 SURGERY — LOWER EXTREMITY ANGIOGRAPHY
Anesthesia: LOCAL | Laterality: Left

## 2020-05-21 MED ORDER — HEPARIN SODIUM (PORCINE) 1000 UNIT/ML IJ SOLN
INTRAMUSCULAR | Status: AC
  Filled 2020-05-21: qty 1

## 2020-05-21 MED ORDER — SODIUM CHLORIDE 0.9% FLUSH: 3.0000 mL | INTRAVENOUS | Status: DC | PRN

## 2020-05-21 MED ORDER — LIDOCAINE HCL (PF) 1 % IJ SOLN
INTRAMUSCULAR | Status: DC | PRN
  Administered 2020-05-21: 20 mL via INTRADERMAL
  Administered 2020-05-21: 5 mL via INTRADERMAL

## 2020-05-21 MED ORDER — MIDAZOLAM HCL 2 MG/2ML IJ SOLN
INTRAMUSCULAR | Status: AC
  Filled 2020-05-21: qty 2

## 2020-05-21 MED ORDER — SODIUM CHLORIDE 0.9 % IV SOLN: INTRAVENOUS | Status: DC

## 2020-05-21 MED ORDER — HEPARIN (PORCINE) IN NACL 1000-0.9 UT/500ML-% IV SOLN
INTRAVENOUS | Status: DC | PRN
  Administered 2020-05-21 (×2): 500 mL

## 2020-05-21 MED ORDER — HYDRALAZINE HCL 20 MG/ML IJ SOLN: 5.0000 mg | INTRAMUSCULAR | Status: DC | PRN

## 2020-05-21 MED ORDER — ONDANSETRON HCL 4 MG/2ML IJ SOLN: 4.0000 mg | Freq: Four times a day (QID) | INTRAMUSCULAR | Status: DC | PRN

## 2020-05-21 MED ORDER — IODIXANOL 320 MG/ML IV SOLN
INTRAVENOUS | Status: DC | PRN
  Administered 2020-05-21: 30 mL via INTRA_ARTERIAL

## 2020-05-21 MED ORDER — LABETALOL HCL 5 MG/ML IV SOLN: 10.0000 mg | INTRAVENOUS | Status: DC | PRN

## 2020-05-21 MED ORDER — SODIUM CHLORIDE 0.9 % IV SOLN: 250.0000 mL | INTRAVENOUS | Status: DC | PRN

## 2020-05-21 MED ORDER — HEPARIN SODIUM (PORCINE) 1000 UNIT/ML IJ SOLN
INTRAMUSCULAR | Status: DC | PRN
  Administered 2020-05-21: 3000 [IU] via INTRAVENOUS
  Administered 2020-05-21: 6000 [IU] via INTRAVENOUS
  Administered 2020-05-21 (×2): 3000 [IU] via INTRAVENOUS

## 2020-05-21 MED ORDER — MIDAZOLAM HCL 2 MG/2ML IJ SOLN
INTRAMUSCULAR | Status: DC | PRN
  Administered 2020-05-21 (×3): 1 mg via INTRAVENOUS

## 2020-05-21 MED ORDER — LIDOCAINE HCL (PF) 1 % IJ SOLN
INTRAMUSCULAR | Status: AC
  Filled 2020-05-21: qty 30

## 2020-05-21 MED ORDER — VERAPAMIL HCL 2.5 MG/ML IV SOLN
INTRAVENOUS | Status: AC
  Filled 2020-05-21: qty 2

## 2020-05-21 MED ORDER — NITROGLYCERIN 1 MG/10 ML FOR IR/CATH LAB
INTRA_ARTERIAL | Status: DC | PRN
  Administered 2020-05-21: 200 ug via INTRA_ARTERIAL

## 2020-05-21 MED ORDER — HEPARIN (PORCINE) IN NACL 1000-0.9 UT/500ML-% IV SOLN
INTRAVENOUS | Status: AC
  Filled 2020-05-21: qty 1500

## 2020-05-21 MED ORDER — ACETAMINOPHEN 325 MG PO TABS: 650.0000 mg | ORAL_TABLET | ORAL | Status: DC | PRN

## 2020-05-21 MED ORDER — FENTANYL CITRATE (PF) 100 MCG/2ML IJ SOLN
INTRAMUSCULAR | Status: AC
  Filled 2020-05-21: qty 2

## 2020-05-21 MED ORDER — TICAGRELOR 90 MG PO TABS
90.0000 mg | ORAL_TABLET | Freq: Once | ORAL | Status: AC
  Administered 2020-05-21: 90 mg via ORAL
  Filled 2020-05-21: qty 1

## 2020-05-21 MED ORDER — ASPIRIN 81 MG PO CHEW
81.0000 mg | CHEWABLE_TABLET | Freq: Once | ORAL | Status: AC
  Administered 2020-05-21: 81 mg via ORAL
  Filled 2020-05-21: qty 1

## 2020-05-21 MED ORDER — SODIUM CHLORIDE 0.9% FLUSH: 3.0000 mL | Freq: Two times a day (BID) | INTRAVENOUS | Status: DC

## 2020-05-21 MED ORDER — NITROGLYCERIN 1 MG/10 ML FOR IR/CATH LAB
INTRA_ARTERIAL | Status: AC
  Filled 2020-05-21: qty 10

## 2020-05-21 MED ORDER — VERAPAMIL HCL 2.5 MG/ML IV SOLN
INTRAVENOUS | Status: DC | PRN
  Administered 2020-05-21: 5 mL via INTRA_ARTERIAL

## 2020-05-21 MED ORDER — FENTANYL CITRATE (PF) 100 MCG/2ML IJ SOLN
INTRAMUSCULAR | Status: DC | PRN
  Administered 2020-05-21: 50 ug via INTRAVENOUS
  Administered 2020-05-21 (×2): 25 ug via INTRAVENOUS

## 2020-05-21 SURGICAL SUPPLY — 29 items
BALLN COYOTE OTW 4X150X150 (BALLOONS) ×3
BALLOON COYOTE OTW 4X150X150 (BALLOONS) IMPLANT
CATH CROSS OVER TEMPO 5F (CATHETERS) ×1 IMPLANT
CATH CXI 2.3F 65 ST (CATHETERS) ×1 IMPLANT
CATH CXI 2.3F 90 ST (CATHETERS) ×1 IMPLANT
CATH SOFT-VU ST 4F 90CM (CATHETERS) ×1 IMPLANT
CLOSURE MYNX CONTROL 5F (Vascular Products) ×1 IMPLANT
DCB RANGER 5.0X150 150 (BALLOONS) IMPLANT
DEVICE TORQUE .014-.018 (MISCELLANEOUS) IMPLANT
GUIDEWIRE ANGLED .035X150CM (WIRE) ×1 IMPLANT
GUIDEWIRE ASTATO XS 20G 300CM (WIRE) ×1 IMPLANT
KIT ENCORE 26 ADVANTAGE (KITS) ×1 IMPLANT
KIT ESSENTIALS PG (KITS) ×1 IMPLANT
KIT MICROPUNCTURE NIT STIFF (SHEATH) ×1 IMPLANT
KIT PV (KITS) ×3 IMPLANT
PATCH THROMBIX TOPICAL PLAIN (HEMOSTASIS) ×1 IMPLANT
RANGER DCB 5.0X150 150 (BALLOONS) ×3
SHEATH GLIDE SLENDER 4/5FR (SHEATH) ×1 IMPLANT
SHEATH PINNACLE 5F 10CM (SHEATH) ×1 IMPLANT
SHEATH PROBE COVER 6X72 (BAG) ×2 IMPLANT
SYR MEDRAD MARK 7 150ML (SYRINGE) ×3 IMPLANT
TAPE VIPERTRACK RADIOPAQ (MISCELLANEOUS) IMPLANT
TAPE VIPERTRACK RADIOPAQUE (MISCELLANEOUS) ×3
TORQUE DEVICE .014-.018 (MISCELLANEOUS) ×3
TRANSDUCER W/STOPCOCK (MISCELLANEOUS) ×3 IMPLANT
TRAY PV CATH (CUSTOM PROCEDURE TRAY) ×3 IMPLANT
WIRE BENTSON .035X145CM (WIRE) ×1 IMPLANT
WIRE HI TORQ COMMND ES.014X300 (WIRE) ×1 IMPLANT
WIRE SPARTACORE .014X300CM (WIRE) ×1 IMPLANT

## 2020-05-21 NOTE — Interval H&P Note (Signed)
History and Physical Interval Note:  05/21/2020 7:44 AM  Jonathon Cain  has presented today for surgery, with the diagnosis of peripheral arterial disease.  The various methods of treatment have been discussed with the patient and family. After consideration of risks, benefits and other options for treatment, the patient has consented to  Procedure(s): LOWER EXTREMITY ANGIOGRAPHY (Left) as a surgical intervention.  The patient's history has been reviewed, patient examined, no change in status, stable for surgery.  I have reviewed the patient's chart and labs.  Questions were answered to the patient's satisfaction.     [place]

## 2020-05-21 NOTE — Discharge Instructions (Signed)
Femoral Site Care This sheet gives you information about how to care for yourself after your procedure. Your health care provider may also give you more specific instructions. If you have problems or questions, contact your health care provider. What can I expect after the procedure?  After the procedure, it is common to have:  Bruising that usually fades within 1-2 weeks.  Tenderness at the site. Follow these instructions at home: Wound care 1. Follow instructions from your health care provider about how to take care of your insertion site. Make sure you: ? Wash your hands with soap and water before you change your bandage (dressing). If soap and water are not available, use hand sanitizer. ? Remove your dressing as told by your health care provider. In 24 hours. Same for L foot dressing. 2. Do not take baths, swim, or use a hot tub until your health care provider approves. 3. You may shower 24-48 hours after the procedure or as told by your health care provider. ? Gently wash the site with plain soap and water. ? Pat the area dry with a clean towel. ? Do not rub the site. This may cause bleeding. 4. Do not apply powder or lotion to the site. Keep the site clean and dry. 5. Check your femoral site every day for signs of infection. Check for: ? Redness, swelling, or pain. ? Fluid or blood. ? Warmth. ? Pus or a bad smell. Activity 1. For the first 2-3 days after your procedure, or as long as directed: ? Avoid climbing stairs as much as possible. ? Do not squat. 2. Do not lift anything that is heavier than 10 lb (4.5 kg), or the limit that you are told, until your health care provider says that it is safe. For 5 days 3. Rest as directed. ? Avoid sitting for a long time without moving. Get up to take short walks every 1-2 hours. 4. Do not drive for 24 hours if you were given a medicine to help you relax (sedative). General instructions  Take over-the-counter and prescription medicines  only as told by your health care provider.  Keep all follow-up visits as told by your health care provider. This is important. Contact a health care provider if you have:  A fever or chills.  You have redness, swelling, or pain around your insertion site. Get help right away if:  The catheter insertion area swells very fast.  You pass out.  You suddenly start to sweat or your skin gets clammy.  The catheter insertion area is bleeding, and the bleeding does not stop when you hold steady pressure on the area.  The area near or just beyond the catheter insertion site becomes pale, cool, tingly, or numb. These symptoms may represent a serious problem that is an emergency. Do not wait to see if the symptoms will go away. Get medical help right away. Call your local emergency services (911 in the [place] Do not drive yourself to the hospital. Summary  After the procedure, it is common to have bruising that usually fades within 1-2 weeks.  Check your femoral site every day for signs of infection.  Do not lift anything that is heavier than 10 lb (4.5 kg), or the limit that you are told, until your health care provider says that it is safe. This information is not intended to replace advice given to you by your health care provider. Make sure you discuss any questions you have with your health care provider. Document  Revised: 03/22/2017 Document Reviewed: 03/22/2017 Elsevier Patient Education  [place].

## 2020-05-21 NOTE — H&P (Signed)
OV copied for documentation     Patient referred by No ref. provider found for CAD  Subjective:   Jonathon Cain, male    DOB: 06-24-1960, 60 y.o.   MRN: [id number]  Cc: PAD  HPI  60 y.o. 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 ABI improved, details below. LDL down from 175 to 64.   He still has LLE claudication. He also has an area of ingrown toenail that appears dark, discolored. This has not completely healed in last month or so. He has upcoming appt with podiatrist next week.   He is enrolled in EMPACT-MI clinical trial.    No current facility-administered medications on file prior to encounter.   Current Outpatient Medications on File Prior to Encounter  Medication Sig Dispense Refill  . aspirin 81 MG chewable tablet Chew 1 tablet (81 mg total) by mouth daily. 30 tablet 11  . atorvastatin (LIPITOR) 80 MG tablet Take 1 tablet (80 mg total) by mouth daily. 30 tablet 3  . cetirizine (ZYRTEC) 10 MG tablet Take 10 mg by mouth daily as needed for allergies.    [name] Kitchen ENTRESTO 24-26 MG TAKE 1 TABLET BY MOUTH TWICE DAILY (Patient taking differently: Take 1 tablet by mouth 2 (two) times daily.) 60 tablet 1  . fluticasone (FLONASE) 50 MCG/ACT nasal spray Place 2 sprays into both nostrils daily as needed for allergies or rhinitis.     . furosemide (LASIX) 40 MG tablet TAKE 1 TABLET(40 MG) BY MOUTH DAILY (Patient taking differently: Take 40 mg by mouth daily.) 90 tablet 1  . metoprolol succinate (TOPROL-XL) 25 MG 24 hr tablet TAKE 1/2 TABLET(12.5 MG) BY MOUTH DAILY (Patient taking differently: Take 12.5 mg by mouth daily.) 30 tablet 2  . pantoprazole  (PROTONIX) 40 MG tablet TAKE 1 TABLET(40 MG) BY MOUTH DAILY (Patient taking differently: Take 40 mg by mouth daily.) 30 tablet 2  . spironolactone (ALDACTONE) 25 MG tablet TAKE 1 TABLET(25 MG) BY MOUTH DAILY (Patient taking differently: Take 25 mg by mouth daily.) 90 tablet 1  . tamsulosin (FLOMAX) 0.4 MG CAPS capsule Take 0.4 mg by mouth daily as needed (urine flow).    . ticagrelor (BRILINTA) 90 MG TABS tablet Take 1 tablet (90 mg total) by mouth 2 (two) times daily. 60 tablet 11  . acetaminophen (TYLENOL) 325 MG tablet Take 2 tablets (650 mg total) by mouth every 4 (four) hours as needed for headache or mild pain.    . nitroGLYCERIN (NITROSTAT) 0.4 MG SL tablet Place 1 tablet (0.4 mg total) under the tongue every 5 (five) minutes as needed for chest pain. 25 tablet 2  . timolol (TIMOPTIC) 0.5 % ophthalmic solution Place 1 drop into both eyes daily as needed (Eye pressure).      Cardiovascular and other pertinent studies:  ABI 04/17/2020:  This exam reveals normal perfusion of the right lower extremity (ABI  1.00). There is mildly abnormal biphasic waveform at the right ankle.  This exam reveals moderately decreased perfusion of the left lower  extremity, noted at the post tibial artery level (ABI 0.75). Severely  abnormal monophasic waveform at the left ankle.  Compared to 03/07/2020, right ABI 0.75 and left ABI 0.66.  Peripheral intervention 03/19/2020:  Rt CIA 80% stenosis Lt CIA 95% stenosis Rt SFA short segment of CTO 3 vessel below the knee runoff Lt SFA short segment of CTO Occluded Lt ATA with 2 vessel below the knee runoff  PTA Rt SFA 5.0X80 mm In.Pact drug coated balloon PTA and stenting Rt CIA 9.0X40 mm Eluvia drug eluting stent PTA and stenting Rt CIA 10.0X40 mm Eluvia drug eluting stent  Will stage Lt SFA revascularization, if clinically indicated  EKG 02/02/2020: Junctional rhythm/einus bradycardia 42 bpm Left anterolateral infarct Left anterior fascicular  block No ischemic  Cardiac MRI 02/01/2020: 1. Subendocardial late gadolinium enhancement consistent with prior infarct in LAD territory. There is >50% transmural LGE suggesting nonviability in mid anterior/anteroseptal walls, apical anterior/septal/inferior walls, and apex. 2. Mild LV dilatation with moderate systolic function (EF 36%). Akinesis of mid to apical anterior, mid anteroseptal, apical septal, apical inferior, and apex. 3.  Normal RV size and systolic function (EF 54%) 4.  Moderate mitral regurgitation (regurgitant fraction 24%) 5.  Moderate right pleural effusion  Coronary angiography 11/14/2019: LM: Diffuse 30% disease. IVUS MLA 7.3 mm2,. dFR 1.0 LAD: Patent prox LAD stent with udner expansion        Optimization with 3.5X15 mm [place] balloon at 22 atm        Excellent expansion as seen on IVUS        TIMI flow II-->III        Diag: Mod diffuse disease LCx: 80% ostial OM1 stenosis. dFR 0.98 RCA: Not engaged today  Echocardiogram 10/24/2019:  Left ventricle cavity is mildly dilated. Distal anteroseptal and apical  akinesis. LV endocardial border not well visualized to accurately asses  EF, probably around 30%. Recommend alternate imaging modality to  accurately assess EF.  Doppler evidence of grade III (restrictive)  diastolic dysfunction.  Left atrial cavity is severely dilated.  Moderate (Grade III) mitral regurgitation.  Inadequate TR jet to estimate PASP. Estimated RA pressure 8 mmHg.  No significant change compared to previous study on 07/27/2019.   Left Heart Catheterization 07/26/19 (Dr. Jacinto Halim):  LM: Mild diffuse disease LAD: Very large caliber vessel, occluded at the proximal end.  Gives origin to high D1 which is large.  Successful PTCA and stenting of the proximal LAD overlapping D1, 3.5 x 24 mm Synergy DES deployed at 12 atmospheric pressure for 60 S. 100% to 0% with TIMI 0 to TIMI-3 flow. LCx: Large vessel, giving origin to a very large OM1 which is secondary  branches.  Ostium of OM1 has 80% stenosis. RCA: Mild diffuse disease, distal RCA 15 to 20% stenosis, PDA 50% stenosis.  Recommendation: Patient will need aggressive risk factor modification, dual antiplatelet therapy with aspirin and Brilinta for a year, will be watched carefully in the intensive care unit for preshock, suspect his EDP is extremely high at the end of the procedure with excessive contrast use.  Will monitor for any contrast nephropathy as well.  Recent labs: 04/19/2020: Chol 119, TG 75, HDL 40, LDL 64  02/19/2020: Glucose 90, BUN/Cr 21/1.04. EGFR 77. Na/K 136/4.0.  AST/ALT 43/55, AlKP 216 H/H 16/50. MCV 87. Platelets 258   11/02/2019: Glucose 85, BUN/Cr 15/1.18. EGFR 66. Na/K 138/4.7.  H/H 14/46. MCV 91. Platelets 222  08/17/2019: Glucose 90, BUN/Cr 17/1.21. EGFR 65. Na/K 136/4.9.  H/H 14/44. MCV 90. Platelets 289  07/26/2019: Glucose 122, BUN/Cr 9/0.9. EGFR 60. H/H 16.1/49.0. MCV 90.2. Platelets 240 HbA1C 5.5% Chol 230, TG 95, HDL 36, LDL 175   Review of Systems  Cardiovascular: Negative for chest  pain, dyspnea on exertion, leg swelling, orthopnea, palpitations and syncope.  Gastrointestinal: Positive for diarrhea and melena.         Vitals:   05/21/20 0539 05/21/20 0742  BP: 127/77   Pulse: (!) 58   Temp: 98.2 F (36.8 C)   SpO2: 99% 99%    Body mass index is 38.02 kg/m. Filed Weights   05/21/20 0539  Weight: 120.2 kg     Objective:   Physical Exam Vitals and nursing note reviewed.  Constitutional:      General: He is not in acute distress. Neck:     Vascular: No JVD.  Cardiovascular:     Rate and Rhythm: Normal rate and regular rhythm.     Pulses: Intact distal pulses.          Dorsalis pedis pulses are 1+ on the right side and 0 on the left side.       Posterior tibial pulses are 0 on the right side and 0 on the left side.     Heart sounds: Normal heart sounds. No murmur heard.     Comments: Left 1st toe with dark discoloration  along medial nailbed Pulmonary:     Effort: Pulmonary effort is normal.     Breath sounds: Normal breath sounds. No wheezing or rales.  Musculoskeletal:     Right lower leg: No edema.     Left lower leg: No edema.          Assessment & Recommendations:   60 y.o. 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 Left foor 1st toenail is ingrown with superficial injury that has not healed completely. This could represent ealry critical limb ischemia. Monitor closely. F/u w/podiatry. Will plan on performing Lt SFA intervention. He wants to do it later this month. He knows to contact me if there is any significant worsening of his wound, before then. Continue DAPT and risk factor modification  CAD: No angina symptoms. S/p primary PCI to culprit prox LAD (07/2019) dFR negative 80% stenosis in OM1 (76184) LAD stent optimized under IVUS (10/2019) Continue Aspirin, Brilinta, Lipitor 80. LDL down to 64.  Ischemic cardiomyopathy: >50% LGE in LAD territory. LVEF 36% Currently on metoprolol succinate 12.5 mg daily, Entesto 24-26 mg bid, spironolactone 25 mg daily. Lasix 40/20 mg alternate  F/u in 4 weeks  Sontee Desena Esther Hardy, MD [facility] Cardiovascular. [place] Pager: [phone] Office: [phone]

## 2020-05-24 ENCOUNTER — Encounter (HOSPITAL_COMMUNITY): Payer: Self-pay | Admitting: Cardiology

## 2020-05-27 ENCOUNTER — Telehealth: Payer: Self-pay

## 2020-05-27 NOTE — Telephone Encounter (Signed)
Where is the swelling?

## 2020-05-27 NOTE — Telephone Encounter (Signed)
[place] desk staff :   Please call patient to schedule sooner appointment, Per Dr. Virgina Jock

## 2020-05-27 NOTE — Telephone Encounter (Signed)
That maybe the collagen plus tht he is feeling. This is expected. I will examine during office visit on 3/10.  Thanks MJP

## 2020-05-27 NOTE — Telephone Encounter (Signed)
I can see him tomorrow afternoon  Thanks MJP

## 2020-05-27 NOTE — Telephone Encounter (Signed)
Pts next appointment is on the 16th

## 2020-05-27 NOTE — Telephone Encounter (Signed)
Pt answered

## 2020-05-28 ENCOUNTER — Other Ambulatory Visit: Payer: Self-pay

## 2020-05-28 ENCOUNTER — Ambulatory Visit: Payer: Medicare HMO | Admitting: Cardiology

## 2020-05-28 ENCOUNTER — Other Ambulatory Visit: Payer: Self-pay | Admitting: Cardiology

## 2020-05-28 ENCOUNTER — Encounter: Payer: Self-pay | Admitting: Cardiology

## 2020-05-28 VITALS — BP 87/54 | HR 64 | Temp 98.0°F | Resp 16 | Ht 70.0 in | Wt 265.8 lb

## 2020-05-28 DIAGNOSIS — I5020 Unspecified systolic (congestive) heart failure: Secondary | ICD-10-CM

## 2020-05-28 DIAGNOSIS — L84 Corns and callosities: Secondary | ICD-10-CM

## 2020-05-28 DIAGNOSIS — I2510 Atherosclerotic heart disease of native coronary artery without angina pectoris: Secondary | ICD-10-CM | POA: Diagnosis not present

## 2020-05-28 DIAGNOSIS — I739 Peripheral vascular disease, unspecified: Secondary | ICD-10-CM

## 2020-05-28 DIAGNOSIS — E782 Mixed hyperlipidemia: Secondary | ICD-10-CM | POA: Diagnosis not present

## 2020-05-28 NOTE — Progress Notes (Signed)
Patient referred by Brunetta Jeans, PA-C for CAD  Subjective:   Jonathon Cain, male    DOB: May 30, 1960, 60 y.o.   MRN: [id number]   Chief Complaint  Patient presents with  . Edema  . HFrEF     HPI  60 y.o. 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 He has had a large bruise in right groin with some pain.    Current Outpatient Medications on File Prior to Visit  Medication Sig Dispense Refill  . acetaminophen (TYLENOL) 325 MG tablet Take 2 tablets (650 mg total) by mouth every 4 (four) hours as needed for headache or mild pain.    [name] Kitchen aspirin 81 MG chewable tablet Chew 1 tablet (81 mg total) by mouth daily. 30 tablet 11  . atorvastatin (LIPITOR) 80 MG tablet TAKE 1 TABLET(80 MG) BY MOUTH DAILY 90 tablet 0  . cetirizine (ZYRTEC) 10 MG tablet Take 10 mg by mouth daily as needed for allergies.    [name] Kitchen ENTRESTO 24-26 MG TAKE 1 TABLET BY MOUTH TWICE DAILY (Patient taking differently: Take 1 tablet by mouth 2 (two) times daily.) 60 tablet 1  . fluticasone (FLONASE) 50 MCG/ACT nasal spray Place 2 sprays into both nostrils daily as needed for allergies or rhinitis.     . furosemide (LASIX) 40 MG tablet TAKE 1 TABLET(40 MG) BY MOUTH DAILY (Patient taking differently: Take 40 mg by mouth daily.) 90 tablet 1  . metoprolol succinate (TOPROL-XL) 25 MG 24 hr tablet TAKE 1/2 TABLET(12.5 MG) BY MOUTH DAILY (Patient taking differently: Take 12.5 mg by mouth daily.) 30 tablet 2  . nitroGLYCERIN (NITROSTAT) 0.4 MG SL tablet Place 1 tablet (0.4 mg total) under the tongue every 5 (five) minutes as needed for chest pain. 25 tablet 2  .  pantoprazole (PROTONIX) 40 MG tablet TAKE 1 TABLET(40 MG) BY MOUTH DAILY (Patient taking differently: Take 40 mg by mouth daily.) 30 tablet 2  . spironolactone (ALDACTONE) 25 MG tablet TAKE 1 TABLET(25 MG) BY MOUTH DAILY (Patient taking differently: Take 25 mg by mouth daily.) 90 tablet 1  . tamsulosin (FLOMAX) 0.4 MG CAPS capsule Take 0.4 mg by mouth daily as needed (urine flow).    . ticagrelor (BRILINTA) 90 MG TABS tablet Take 1 tablet (90 mg total) by mouth 2 (two) times daily. 60 tablet 11   No current facility-administered medications on file prior to visit.    Cardiovascular and other pertinent studies:  PV intervention 05/21/2020: Left SFA CTO Successful retrograde revascularization with 5.0X150 mm [place] DCB  EKG 05/28/2020: Sinus rhythm 51 bpm First degree A-V block  Left anterior fascicular block Poor R-wave progression Nonspecific ST changes  05/21/2020: Left SFA CTO Successful retrograde revascularization with 5.0X150 mm [place] DCB   ABI 04/17/2020:  This exam reveals normal perfusion of the right lower extremity (ABI  1.00). There is mildly abnormal biphasic waveform at the right ankle.  This exam reveals moderately decreased perfusion of the left lower  extremity, noted at the post tibial artery level (ABI 0.75). Severely  abnormal monophasic waveform at the left ankle.  Compared to 03/07/2020, right ABI 0.75 and left ABI 0.66.  Peripheral intervention 03/19/2020: Rt CIA 80% stenosis  Lt CIA 95% stenosis Rt SFA short segment of CTO 3 vessel below the knee runoff Lt SFA short segment of CTO Occluded Lt ATA with 2 vessel below the knee runoff  PTA Rt SFA 5.0X80 mm In.Pact drug coated balloon PTA and stenting Rt CIA 9.0X40 mm Eluvia drug eluting stent PTA and stenting Rt CIA 10.0X40 mm Eluvia drug eluting stent  Will stage Lt SFA revascularization, if clinically indicated  EKG 02/02/2020: Junctional rhythm/einus bradycardia 42 bpm Left anterolateral  infarct Left anterior fascicular block No ischemic  Cardiac MRI 02/01/2020: 1. Subendocardial late gadolinium enhancement consistent with prior infarct in LAD territory. There is >50% transmural LGE suggesting nonviability in mid anterior/anteroseptal walls, apical anterior/septal/inferior walls, and apex. 2. Mild LV dilatation with moderate systolic function (EF 09%). Akinesis of mid to apical anterior, mid anteroseptal, apical septal, apical inferior, and apex. 3.  Normal RV size and systolic function (EF 38%) 4.  Moderate mitral regurgitation (regurgitant fraction 24%) 5.  Moderate right pleural effusion  Coronary angiography 11/14/2019: LM: Diffuse 30% disease. IVUS MLA 7.3 mm2,. dFR 1.0 LAD: Patent prox LAD stent with udner expansion        Optimization with 3.5X15 mm [place] balloon at 22 atm        Excellent expansion as seen on IVUS        TIMI flow II-->III        Diag: Mod diffuse disease LCx: 80% ostial OM1 stenosis. dFR 0.98 RCA: Not engaged today  Echocardiogram 10/24/2019:  Left ventricle cavity is mildly dilated. Distal anteroseptal and apical  akinesis. LV endocardial border not well visualized to accurately asses  EF, probably around 30%. Recommend alternate imaging modality to  accurately assess EF.  Doppler evidence of grade III (restrictive)  diastolic dysfunction.  Left atrial cavity is severely dilated.  Moderate (Grade III) mitral regurgitation.  Inadequate TR jet to estimate PASP. Estimated RA pressure 8 mmHg.  No significant change compared to previous study on 07/27/2019.   Left Heart Catheterization 07/26/19 (Dr. Einar Gip):  LM: Mild diffuse disease LAD: Very large caliber vessel, occluded at the proximal end.  Gives origin to high D1 which is large.  Successful PTCA and stenting of the proximal LAD overlapping D1, 3.5 x 24 mm Synergy DES deployed at 12 atmospheric pressure for 60 S. 100% to 0% with TIMI 0 to TIMI-3 flow. LCx: Large vessel, giving origin to a  very large OM1 which is secondary branches.  Ostium of OM1 has 80% stenosis. RCA: Mild diffuse disease, distal RCA 15 to 20% stenosis, PDA 50% stenosis.  Recommendation: Patient will need aggressive risk factor modification, dual antiplatelet therapy with aspirin and Brilinta for a year, will be watched carefully in the intensive care unit for preshock, suspect his EDP is extremely high at the end of the procedure with excessive contrast use.  Will monitor for any contrast nephropathy as well.  Recent labs: 04/19/2020: Chol 119, TG 75, HDL 40, LDL 64  02/19/2020: Glucose 90, BUN/Cr 21/1.04. EGFR 77. Na/K 136/4.0.  AST/ALT 43/55, AlKP 216 H/H 16/50. MCV 87. Platelets 258   11/02/2019: Glucose 85, BUN/Cr 15/1.18. EGFR 66. Na/K 138/4.7.  H/H 14/46. MCV 91. Platelets 222  08/17/2019: Glucose 90, BUN/Cr 17/1.21. EGFR 65. Na/K 136/4.9.  H/H 14/44. MCV 90. Platelets 289  07/26/2019: Glucose 122, BUN/Cr 9/0.9. EGFR 60. H/H 16.1/49.0. MCV 90.2. Platelets 240 HbA1C 5.5% Chol 230, TG 95, HDL 36, LDL 175   Review of Systems  Cardiovascular: Negative for chest pain, claudication, dyspnea on  exertion, leg swelling, orthopnea, palpitations and syncope.  Skin:       Bruise in right groin  Gastrointestinal: Positive for diarrhea and melena.         Vitals:   05/28/20 1357  BP: (!) 87/54  Pulse: 64  Resp: 16  Temp: 98 F (36.7 C)  SpO2: 99%     Body mass index is 38.14 kg/m. Filed Weights   05/28/20 1357  Weight: 265 lb 12.8 oz (120.6 kg)     Objective:   Physical Exam Vitals and nursing note reviewed.  Constitutional:      General: He is not in acute distress. Neck:     Vascular: No JVD.  Cardiovascular:     Rate and Rhythm: Normal rate and regular rhythm.     Pulses: Intact distal pulses.          Dorsalis pedis pulses are 1+ on the right side and 1+ on the left side.       Posterior tibial pulses are 1+ on the right side and 1+ on the left side.     Heart sounds:  Normal heart sounds. No murmur heard.     Comments: Left 1st toe ingrown toenail injury healing well Large ecchymosis right groin, extending into pubic rea Firm tenderness at the site of vascular access Pulmonary:     Effort: Pulmonary effort is normal.     Breath sounds: Normal breath sounds. No wheezing or rales.  Musculoskeletal:     Right lower leg: No edema.     Left lower leg: No edema.          Assessment & Recommendations:   60 y.o. 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 If this is ecchymosis, expect it to improve. Is pseudoaneurysm, may need treatment  Continue DAPT and risk factor modification  CAD: No angina symptoms. S/p primary PCI to culprit prox LAD (07/2019) dFR negative 80% stenosis in OM1 (96295) LAD stent optimized under IVUS (10/2019) Continue Aspirin, Brilinta, Lipitor 80. LDL down to 64.  Ischemic cardiomyopathy: >50% LGE in LAD territory. LVEF 36% Currently on metoprolol succinate 12.5 mg daily, Entesto 24-26 mg bid, spironolactone 25 mg daily. Lasix 40/20 mg alternate  F/u in 4 weeks  Manish Esther Hardy, MD [facility] Cardiovascular. [place] Pager: [phone] Office: [phone]

## 2020-05-28 NOTE — Telephone Encounter (Signed)
Please call pt

## 2020-05-30 ENCOUNTER — Ambulatory Visit: Payer: Medicare HMO | Admitting: Podiatry

## 2020-05-30 ENCOUNTER — Other Ambulatory Visit: Payer: Self-pay

## 2020-05-30 DIAGNOSIS — L600 Ingrowing nail: Secondary | ICD-10-CM

## 2020-05-30 DIAGNOSIS — I739 Peripheral vascular disease, unspecified: Secondary | ICD-10-CM

## 2020-05-30 MED ORDER — NEOMYCIN-POLYMYXIN-HC 3.5-10000-1 OT SUSP: OTIC | 0 refills | Status: DC

## 2020-05-30 NOTE — Progress Notes (Unsigned)
  Subjective:  Patient ID: Jonathon Cain, male    DOB: 1961-03-16,  MRN: [id number]  Chief Complaint  Patient presents with  . Callouses    Callus follow up     60 y.o. male returns with the above complaint. History confirmed with patient.  He is now status post revascularization of the left lower extremity with stenting of a CTO. Objective:  Physical Exam: warm, good capillary refill, he now has bilateral palpable dorsalis pedis and posterior tibial pulses.  He has mild ingrown toenails with no paronychia both hallux both borders.  Assessment:   1. Ingrowing left great toenail   2. Ingrowing right great toenail   3. Peripheral arterial disease ([place])      Plan:  Patient was evaluated and treated and all questions answered.    Ingrown Nail, bilaterally -Patient elects to proceed with minor surgery to remove ingrown toenail today. Consent reviewed and signed by patient.  We discussed risks of nonhealing with his peripheral arterial disease, I think this risk is low and has been mitigated by his recent successful revascularizations -Ingrown nail excised. See procedure note. -Educated on post-procedure care including soaking. Written instructions provided and reviewed. -Patient to follow up in 2 weeks for nail check.  Procedure: Excision of Ingrown Toenail Location: Bilateral 1st toe medial and lateral nail borders. Anesthesia: Lidocaine 1% plain; 1.5 mL and Marcaine 0.5% plain; 1.5 mL, digital block. Skin Prep: Betadine. Dressing: Silvadene; telfa; dry, sterile, compression dressing. Technique: Following skin prep, the toe was exsanguinated and a tourniquet was secured at the base of the toe. The affected nail border was freed, split with a nail splitter, and excised. Chemical matrixectomy was then performed with phenol and irrigated out with alcohol. The tourniquet was then removed and sterile dressing applied. Disposition: Patient tolerated procedure well. Patient to return in  2 weeks for follow-up.     No follow-ups on file.

## 2020-05-30 NOTE — Patient Instructions (Signed)

## 2020-05-31 ENCOUNTER — Other Ambulatory Visit: Payer: Self-pay | Admitting: Cardiology

## 2020-06-04 ENCOUNTER — Ambulatory Visit: Payer: Medicare HMO

## 2020-06-04 ENCOUNTER — Other Ambulatory Visit: Payer: Self-pay

## 2020-06-04 DIAGNOSIS — I739 Peripheral vascular disease, unspecified: Secondary | ICD-10-CM

## 2020-06-05 ENCOUNTER — Encounter: Payer: Self-pay | Admitting: Cardiology

## 2020-06-05 ENCOUNTER — Ambulatory Visit: Payer: Medicare HMO | Admitting: Cardiology

## 2020-06-05 ENCOUNTER — Other Ambulatory Visit: Payer: Self-pay

## 2020-06-05 VITALS — BP 93/49 | HR 44 | Temp 97.8°F | Resp 16 | Ht 70.0 in | Wt 271.0 lb

## 2020-06-05 DIAGNOSIS — E782 Mixed hyperlipidemia: Secondary | ICD-10-CM | POA: Diagnosis not present

## 2020-06-05 DIAGNOSIS — I739 Peripheral vascular disease, unspecified: Secondary | ICD-10-CM

## 2020-06-05 DIAGNOSIS — I5020 Unspecified systolic (congestive) heart failure: Secondary | ICD-10-CM

## 2020-06-05 MED ORDER — METOPROLOL SUCCINATE ER 25 MG PO TB24: 12.5000 mg | ORAL_TABLET | ORAL | 2 refills | Status: DC

## 2020-06-05 NOTE — Progress Notes (Signed)
Patient referred by Brunetta Jeans, PA-C for CAD  Subjective:   Jonathon Cain, male    DOB: [birth year], 60 y.o.   MRN: [id number]   Chief Complaint  Patient presents with  . Peripheral arterial disease   . Follow-up     HPI  60 y.o. 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 He underwent ingrown toenail removal. Rt groin bruise is improving.    Current Outpatient Medications on File Prior to Visit  Medication Sig Dispense Refill  . acetaminophen (TYLENOL) 325 MG tablet Take 2 tablets (650 mg total) by mouth every 4 (four) hours as needed for headache or mild pain.    [name] Kitchen aspirin 81 MG chewable tablet Chew 1 tablet (81 mg total) by mouth daily. 30 tablet 11  . atorvastatin (LIPITOR) 80 MG tablet TAKE 1 TABLET(80 MG) BY MOUTH DAILY 90 tablet 0  . cetirizine (ZYRTEC) 10 MG tablet Take 10 mg by mouth daily as needed for allergies.    [name] Kitchen ENTRESTO 24-26 MG TAKE 1 TABLET BY MOUTH TWICE DAILY (Patient taking differently: Take 1 tablet by mouth 2 (two) times daily.) 60 tablet 1  . fluticasone (FLONASE) 50 MCG/ACT nasal spray Place 2 sprays into both nostrils daily as needed for allergies or rhinitis.     . furosemide (LASIX) 40 MG tablet TAKE 1 TABLET(40 MG) BY MOUTH DAILY (Patient taking differently: Take 40 mg by mouth daily.) 90 tablet 1  . metoprolol succinate (TOPROL-XL) 25 MG 24 hr tablet TAKE 1/2 TABLET(12.5 MG) BY MOUTH DAILY (Patient taking differently: Take 12.5 mg by mouth daily.) 30 tablet 2  . neomycin-polymyxin-hydrocortisone (CORTISPORIN) 3.5-10000-1 OTIC suspension Apply 1-2 drops daily after soaking and cover with bandaid 10 mL 0  . nitroGLYCERIN (NITROSTAT)  0.4 MG SL tablet Place 1 tablet (0.4 mg total) under the tongue every 5 (five) minutes as needed for chest pain. 25 tablet 2  . pantoprazole (PROTONIX) 40 MG tablet TAKE 1 TABLET(40 MG) BY MOUTH DAILY (Patient taking differently: Take 40 mg by mouth daily.) 30 tablet 2  . spironolactone (ALDACTONE) 25 MG tablet TAKE 1 TABLET(25 MG) BY MOUTH DAILY (Patient taking differently: Take 25 mg by mouth daily.) 90 tablet 1  . tamsulosin (FLOMAX) 0.4 MG CAPS capsule Take 0.4 mg by mouth daily as needed (urine flow).    . ticagrelor (BRILINTA) 90 MG TABS tablet Take 1 tablet (90 mg total) by mouth 2 (two) times daily. 60 tablet 11   No current facility-administered medications on file prior to visit.    Cardiovascular and other pertinent studies:  PV intervention 05/21/2020: Left SFA CTO Successful retrograde revascularization with 5.0X150 mm Ranger DCB  EKG 05/28/2020: Sinus rhythm 51 bpm First degree A-V block  Left anterior fascicular block Poor R-wave progression Nonspecific ST changes  05/21/2020: Left SFA CTO Successful retrograde revascularization with 5.0X150 mm Ranger DCB   ABI 04/17/2020:  This exam reveals normal perfusion of the right lower extremity (ABI  1.00). There is mildly abnormal biphasic waveform at the right ankle.  This exam reveals moderately decreased perfusion of the left lower  extremity, noted at the post tibial artery level (ABI 0.75). Severely  abnormal monophasic waveform at the left ankle.  Compared to 03/07/2020, right ABI 0.75  and left ABI 0.66.  Peripheral intervention 03/19/2020: Rt CIA 80% stenosis Lt CIA 95% stenosis Rt SFA short segment of CTO 3 vessel below the knee runoff Lt SFA short segment of CTO Occluded Lt ATA with 2 vessel below the knee runoff  PTA Rt SFA 5.0X80 mm In.Pact drug coated balloon PTA and stenting Rt CIA 9.0X40 mm Eluvia drug eluting stent PTA and stenting Rt CIA 10.0X40 mm Eluvia drug eluting stent  Will stage Lt SFA  revascularization, if clinically indicated  EKG 02/02/2020: Junctional rhythm/einus bradycardia 42 bpm Left anterolateral infarct Left anterior fascicular block No ischemic  Cardiac MRI 02/01/2020: 1. Subendocardial late gadolinium enhancement consistent with prior infarct in LAD territory. There is >50% transmural LGE suggesting nonviability in mid anterior/anteroseptal walls, apical anterior/septal/inferior walls, and apex. 2. Mild LV dilatation with moderate systolic function (EF 24%). Akinesis of mid to apical anterior, mid anteroseptal, apical septal, apical inferior, and apex. 3.  Normal RV size and systolic function (EF 09%) 4.  Moderate mitral regurgitation (regurgitant fraction 24%) 5.  Moderate right pleural effusion  Coronary angiography 11/14/2019: LM: Diffuse 30% disease. IVUS MLA 7.3 mm2,. dFR 1.0 LAD: Patent prox LAD stent with udner expansion        Optimization with 3.5X15 mm Liborio Negron Torres balloon at 22 atm        Excellent expansion as seen on IVUS        TIMI flow II-->III        Diag: Mod diffuse disease LCx: 80% ostial OM1 stenosis. dFR 0.98 RCA: Not engaged today  Echocardiogram 10/24/2019:  Left ventricle cavity is mildly dilated. Distal anteroseptal and apical  akinesis. LV endocardial border not well visualized to accurately asses  EF, probably around 30%. Recommend alternate imaging modality to  accurately assess EF.  Doppler evidence of grade III (restrictive)  diastolic dysfunction.  Left atrial cavity is severely dilated.  Moderate (Grade III) mitral regurgitation.  Inadequate TR jet to estimate PASP. Estimated RA pressure 8 mmHg.  No significant change compared to previous study on 07/27/2019.   Left Heart Catheterization 07/26/19 (Dr. Einar Gip):  LM: Mild diffuse disease LAD: Very large caliber vessel, occluded at the proximal end.  Gives origin to high D1 which is large.  Successful PTCA and stenting of the proximal LAD overlapping D1, 3.5 x 24 mm Synergy DES  deployed at 12 atmospheric pressure for 60 S. 100% to 0% with TIMI 0 to TIMI-3 flow. LCx: Large vessel, giving origin to a very large OM1 which is secondary branches.  Ostium of OM1 has 80% stenosis. RCA: Mild diffuse disease, distal RCA 15 to 20% stenosis, PDA 50% stenosis.  Recommendation: Patient will need aggressive risk factor modification, dual antiplatelet therapy with aspirin and Brilinta for a year, will be watched carefully in the intensive care unit for preshock, suspect his EDP is extremely high at the end of the procedure with excessive contrast use.  Will monitor for any contrast nephropathy as well.  Recent labs: 04/19/2020: Chol 119, TG 75, HDL 40, LDL 64  02/19/2020: Glucose 90, BUN/Cr 21/1.04. EGFR 77. Na/K 136/4.0.  AST/ALT 43/55, AlKP 216 H/H 16/50. MCV 87. Platelets 258   11/02/2019: Glucose 85, BUN/Cr 15/1.18. EGFR 66. Na/K 138/4.7.  H/H 14/46. MCV 91. Platelets 222  08/17/2019: Glucose 90, BUN/Cr 17/1.21. EGFR 65. Na/K 136/4.9.  H/H 14/44. MCV 90. Platelets 289  07/26/2019: Glucose 122, BUN/Cr 9/0.9. EGFR 60. H/H 16.1/49.0. MCV 90.2. Platelets 240 HbA1C 5.5% Chol 230, TG 95, HDL 36, LDL 175  Review of Systems  Cardiovascular: Negative for chest pain, claudication, dyspnea on exertion, leg swelling, orthopnea, palpitations and syncope.  Skin:       Bruise in right groin         Vitals:   06/05/20 1342  BP: (!) 93/49  Pulse: (!) 44  Resp: 16  Temp: 97.8 F (36.6 C)  SpO2: 98%     Body mass index is 38.88 kg/m. Filed Weights   06/05/20 1342  Weight: 271 lb (122.9 kg)     Objective:   Physical Exam Vitals and nursing note reviewed.  Constitutional:      General: He is not in acute distress. Neck:     Vascular: No JVD.  Cardiovascular:     Rate and Rhythm: Normal rate and regular rhythm.     Pulses: Intact distal pulses.          Dorsalis pedis pulses are 1+ on the right side and 1+ on the left side.       Posterior tibial pulses  are 1+ on the right side and 1+ on the left side.     Heart sounds: Normal heart sounds. No murmur heard.     Comments: Rt groin ecchymosis significantly improved Pulmonary:     Effort: Pulmonary effort is normal.     Breath sounds: Normal breath sounds. No wheezing or rales.  Musculoskeletal:     Right lower leg: No edema.     Left lower leg: No edema.          Assessment & Recommendations:   60 y.o. 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 Residual mild below the knee disease Ecchymosis and tenderness significantly improved in right groin Continue DAPT and risk factor modification In June 2022, will stop Aspirin/Brilinta and start Plavix and Xarelto 2.5 mg bid, instead.  CAD: No angina symptoms. S/p primary PCI to culprit prox LAD (07/2019) dFR negative 80% stenosis in OM1 (97353) LAD stent optimized under IVUS (10/2019) Continue Aspirin, Brilinta, Lipitor 80. LDL down to 64. In June 2022, will stop Aspirin/Brilinta and start Plavix and Xarelto 2.5 mg bid, instead.  Ischemic cardiomyopathy: >50% LGE in LAD territory. LVEF 36% Entesto 24-26 mg bid, spironolactone 25 mg daily. Lasix 40/20 mg alternate Take metoprolol succinate 12.5 mg every other day, due to bradycardia   F/u in 3 months  [place] Esther Hardy, MD [facility] Cardiovascular. [place] Pager: [phone] Office: [phone]

## 2020-06-07 ENCOUNTER — Encounter: Payer: Self-pay | Admitting: Family Medicine

## 2020-06-07 ENCOUNTER — Ambulatory Visit (INDEPENDENT_AMBULATORY_CARE_PROVIDER_SITE_OTHER): Payer: Medicare HMO | Admitting: Family Medicine

## 2020-06-07 ENCOUNTER — Other Ambulatory Visit: Payer: Self-pay

## 2020-06-07 VITALS — BP 110/70 | HR 108 | Ht 70.0 in | Wt 270.0 lb

## 2020-06-07 DIAGNOSIS — L02214 Cutaneous abscess of groin: Secondary | ICD-10-CM

## 2020-06-07 NOTE — Patient Instructions (Signed)
Keep wound dry as possible  Follow up in 3 days (on Monday) to recheck  We will plan to remove the packing at that time.

## 2020-06-07 NOTE — Progress Notes (Signed)
Established Patient Office Visit  Subjective:  Patient ID: Jonathon Cain, male    DOB: 1960-10-19  Age: 60 y.o. MRN: [id number]  CC:  Chief Complaint  Patient presents with  . cyst on testicle    HPI Jonathon Cain presents for chief complaint of a "cyst on testicle ".  His pain and swelling are actually lateral to the testicle region on the right side.  He first noticed some pain and swelling around Sunday.  Progressive swelling since then.  No fevers or chills.  He has not noted any actual pain in the scrotal sac.  He has multiple chronic problems including history of CAD, heart failure, hyperlipidemia  Past Medical History:  Diagnosis Date  . Allergy   . Bladder outlet obstruction   . Cancer (HCC)    Skin  . CHF (congestive heart failure) ([place])   . Coronary artery disease   . Hyperlipidemia   . Hypertension   . Retinitis pigmentosa     Past Surgical History:  Procedure Laterality Date  . ABDOMINAL AORTOGRAM W/LOWER EXTREMITY N/A 03/19/2020   Procedure: ABDOMINAL AORTOGRAM W/LOWER EXTREMITY;  Surgeon: Nigel Mormon, MD;  Location: [facility];  Service: Cardiovascular;  Laterality: N/A;  . CARDIAC CATHETERIZATION    . CATARACT EXTRACTION Bilateral 2004  . CORONARY BALLOON ANGIOPLASTY N/A 11/14/2019   Procedure: CORONARY BALLOON ANGIOPLASTY;  Surgeon: Nigel Mormon, MD;  Location: [place] [facility];  Service: Cardiovascular;  Laterality: N/A;  . CORONARY STENT INTERVENTION N/A 07/26/2019   Procedure: CORONARY STENT INTERVENTION;  Surgeon: Adrian Prows, MD;  Location: [place] [facility];  Service: Cardiovascular;  Laterality: N/A;  . CORONARY/GRAFT ACUTE MI REVASCULARIZATION N/A 07/26/2019   Procedure: CORONARY/GRAFT ACUTE MI REVASCULARIZATION;  Surgeon: Adrian Prows, MD;  Location: [facility];  Service: Cardiovascular;  Laterality: N/A;  . EYE SURGERY    . INTRAVASCULAR PRESSURE WIRE/FFR STUDY N/A 11/14/2019   Procedure: INTRAVASCULAR PRESSURE  WIRE/FFR STUDY;  Surgeon: Nigel Mormon, MD;  Location: [facility];  Service: Cardiovascular;  Laterality: N/A;  . INTRAVASCULAR ULTRASOUND/IVUS N/A 11/14/2019   Procedure: Intravascular Ultrasound/IVUS;  Surgeon: Nigel Mormon, MD;  Location: [place] [facility];  Service: Cardiovascular;  Laterality: N/A;  . LEFT HEART CATH AND CORONARY ANGIOGRAPHY N/A 07/26/2019   Procedure: LEFT HEART CATH AND CORONARY ANGIOGRAPHY;  Surgeon: Adrian Prows, MD;  Location: [place];  Service: Cardiovascular;  Laterality: N/A;  . LOWER EXTREMITY ANGIOGRAPHY Left 05/21/2020   Procedure: LOWER EXTREMITY ANGIOGRAPHY;  Surgeon: Nigel Mormon, MD;  Location: [place] [facility];  Service: Cardiovascular;  Laterality: Left;  . PERIPHERAL VASCULAR BALLOON ANGIOPLASTY Right 03/19/2020   Procedure: PERIPHERAL VASCULAR BALLOON ANGIOPLASTY;  Surgeon: Nigel Mormon, MD;  Location: [facility];  Service: Cardiovascular;  Laterality: Right;  SFA  . PERIPHERAL VASCULAR BALLOON ANGIOPLASTY  05/21/2020   Procedure: PERIPHERAL VASCULAR BALLOON ANGIOPLASTY;  Surgeon: Nigel Mormon, MD;  Location: [place] [facility];  Service: Cardiovascular;;  left SFA  . PERIPHERAL VASCULAR INTERVENTION Bilateral 03/19/2020   Procedure: PERIPHERAL VASCULAR INTERVENTION;  Surgeon: Nigel Mormon, MD;  Location: [facility];  Service: Cardiovascular;  Laterality: Bilateral;  External Iliac  . 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 Heart disease Mother   . Hyperlipidemia Mother   . Asthma Mother   . Heart disease Father   .  Hypertension Father   . Heart disease Maternal Grandmother   . Heart disease Maternal Grandfather   . Heart disease Paternal Grandmother   . Heart disease Paternal Grandfather     Social History   Socioeconomic History  . Marital status: Single    Spouse name: Not on file  . Number  of children: 0  . Years of education: 16  . Highest education level: Bachelor's degree (e.g., BA, AB, BS)  Occupational History  . Not on file  Tobacco Use  . Smoking status: Former Smoker    Packs/day: 1.00    Years: 10.00    Pack years: 10.00    Types: Cigarettes    Quit date: 07/26/2019    Years since quitting: 0.8  . Smokeless tobacco: Never Used  Vaping Use  . Vaping Use: Never used  Substance and Sexual Activity  . Alcohol use: Yes    Comment: occasional  . Drug use: No  . Sexual activity: Not Currently  Other Topics Concern  . Not on file  Social History Narrative  . Not on file   Social Determinants of Health   Financial Resource Strain: Not on file  Food Insecurity: Not on file  Transportation Needs: Not on file  Physical Activity: Not on file  Stress: Not on file  Social Connections: Not on file  Intimate Partner Violence: Not on file    Outpatient Medications Prior to Visit  Medication Sig Dispense Refill  . acetaminophen (TYLENOL) 325 MG tablet Take 2 tablets (650 mg total) by mouth every 4 (four) hours as needed for headache or mild pain.    [name] Kitchen aspirin 81 MG chewable tablet Chew 1 tablet (81 mg total) by mouth daily. 30 tablet 11  . atorvastatin (LIPITOR) 80 MG tablet TAKE 1 TABLET(80 MG) BY MOUTH DAILY 90 tablet 0  . cetirizine (ZYRTEC) 10 MG tablet Take 10 mg by mouth daily as needed for allergies.    [name] Kitchen ENTRESTO 24-26 MG TAKE 1 TABLET BY MOUTH TWICE DAILY (Patient taking differently: Take 1 tablet by mouth 2 (two) times daily.) 60 tablet 1  . fluticasone (FLONASE) 50 MCG/ACT nasal spray Place 2 sprays into both nostrils daily as needed for allergies or rhinitis.     . furosemide (LASIX) 40 MG tablet TAKE 1 TABLET(40 MG) BY MOUTH DAILY (Patient taking differently: Take 40 mg by mouth daily.) 90 tablet 1  . metoprolol succinate (TOPROL-XL) 25 MG 24 hr tablet Take 0.5 tablets (12.5 mg total) by mouth every other day. 30 tablet 2  .  neomycin-polymyxin-hydrocortisone (CORTISPORIN) 3.5-10000-1 OTIC suspension Apply 1-2 drops daily after soaking and cover with bandaid 10 mL 0  . nitroGLYCERIN (NITROSTAT) 0.4 MG SL tablet Place 1 tablet (0.4 mg total) under the tongue every 5 (five) minutes as needed for chest pain. 25 tablet 2  . pantoprazole (PROTONIX) 40 MG tablet TAKE 1 TABLET(40 MG) BY MOUTH DAILY (Patient taking differently: Take 40 mg by mouth daily.) 30 tablet 2  . spironolactone (ALDACTONE) 25 MG tablet TAKE 1 TABLET(25 MG) BY MOUTH DAILY (Patient taking differently: Take 25 mg by mouth daily.) 90 tablet 1  . tamsulosin (FLOMAX) 0.4 MG CAPS capsule Take 0.4 mg by mouth daily as needed (urine flow).    . ticagrelor (BRILINTA) 90 MG TABS tablet Take 1 tablet (90 mg total) by mouth 2 (two) times daily. 60 tablet 11   No facility-administered medications prior to visit.    No Known Allergies  ROS Review of Systems  Constitutional:  Negative for chills and fever.  Gastrointestinal: Negative for nausea and vomiting.      Objective:    Physical Exam Vitals reviewed.  Cardiovascular:     Rate and Rhythm: Normal rate.  Pulmonary:     Effort: Pulmonary effort is normal.     Breath sounds: Normal breath sounds.  Genitourinary:    Comments: Patient has some swelling and very mild erythema just to the right side of the scrotal sac laterally.  He has somewhat elongated area that is slightly fluctuant near the middle.  No visible pustules.  Testes are normal in size and nontender. Neurological:     Mental Status: He is alert.     BP 110/70   Pulse (!) 108   Ht 5\' 10"  (1.778 m)   Wt 270 lb (122.5 kg)   SpO2 97%   BMI 38.74 kg/m  Wt Readings from Last 3 Encounters:  06/07/20 270 lb (122.5 kg)  06/05/20 271 lb (122.9 kg)  05/28/20 265 lb 12.8 oz (120.6 kg)     Health Maintenance Due  Topic Date Due  . Hepatitis C Screening  Never done  . DTAP VACCINES (1) Never done  . HIV Screening  Never done  .  DTaP/Tdap/Td (1 - Tdap) Never done  . TETANUS/TDAP  Never done  . COLONOSCOPY (Pts 45-63yrs Insurance coverage will need to be confirmed)  Never done  . INFLUENZA VACCINE  Never done  . COVID-19 Vaccine (3 - Pfizer risk 4-dose series) 02/01/2020       Topic Date Due  . DTAP VACCINES (1) Never done    No results found for: TSH Lab Results  Component Value Date   WBC 16.4 (H) 05/17/2020   HGB 15.7 05/17/2020   HCT 46.8 05/17/2020   MCV 89 05/17/2020   PLT 241 05/17/2020   Lab Results  Component Value Date   NA 133 (L) 05/17/2020   K 5.0 05/17/2020   CHLORIDE 103 12/03/2016   CO2 19 (L) 05/17/2020   GLUCOSE 86 05/17/2020   BUN 24 05/17/2020   CREATININE 0.95 05/17/2020   BILITOT 1.1 02/19/2020   ALKPHOS 216 (H) 02/19/2020   AST 43 (H) 02/19/2020   ALT 55 (H) 02/19/2020   PROT 7.4 02/19/2020   ALBUMIN 3.9 02/19/2020   CALCIUM 9.0 05/17/2020   ANIONGAP 9 10/25/2019   EGFR >90 12/03/2016   GFR 77.54 02/19/2020   Lab Results  Component Value Date   CHOL 119 04/19/2020   Lab Results  Component Value Date   HDL 40 04/19/2020   Lab Results  Component Value Date   LDLCALC 64 04/19/2020   Lab Results  Component Value Date   TRIG 75 04/19/2020   Lab Results  Component Value Date   CHOLHDL 3.0 04/19/2020   Lab Results  Component Value Date   HGBA1C 5.5 07/26/2019      Assessment & Plan:   Fairly large abscess right inguinal region just lateral to the scrotal sac.  Discussed risk and benefits of incision and drainage and packing including risk of bleeding.  He does take aspirin and Brilinta.  We also discussed the fact that this would not improve with antibiotics alone and needs drainage.  Patient consented.  Prepped skin with Betadine.  We used 2% plain Xylocaine and infiltrated area.  Using #11 blade made incision approximately 1-1/2 cm in length over the area of maximum fluctuance.  Copious pus was expressed.  We freed up some deeper pockets with curved  hemostats.  Wound cavity was packed with quarter inch iodoform gauze.  Minimal bleeding.  Patient tolerated well.  Outer dressing applied.  -Recommend he keep wound dry over the weekend -Change outer dressing if necessary. -We recommend return Monday for packing removal and to reassess  No orders of the defined types were placed in this encounter.   Follow-up: No follow-ups on file.    Carolann Littler, MD

## 2020-06-10 ENCOUNTER — Ambulatory Visit (INDEPENDENT_AMBULATORY_CARE_PROVIDER_SITE_OTHER): Payer: Medicare HMO | Admitting: Family Medicine

## 2020-06-10 ENCOUNTER — Other Ambulatory Visit: Payer: Self-pay

## 2020-06-10 ENCOUNTER — Encounter: Payer: Self-pay | Admitting: Family Medicine

## 2020-06-10 ENCOUNTER — Telehealth: Payer: Self-pay

## 2020-06-10 VITALS — BP 110/70 | HR 74 | Ht 70.0 in | Wt 270.0 lb

## 2020-06-10 DIAGNOSIS — L02214 Cutaneous abscess of groin: Secondary | ICD-10-CM

## 2020-06-10 NOTE — Patient Instructions (Signed)
Skin Abscess  A skin abscess is an infected area on or under your skin that contains a collection of pus and other material. An abscess may also be called a furuncle, carbuncle, or boil. An abscess can occur in or on almost any part of your body. Some abscesses break open (rupture) on their own. Most continue to get worse unless they are treated. The infection can spread deeper into the body and eventually into your blood, which can make you feel ill. Treatment usually involves draining the abscess. What are the causes? An abscess occurs when germs, like bacteria, pass through your skin and cause an infection. This may be caused by:  A scrape or cut on your skin.  A puncture wound through your skin, including a needle injection or insect bite.  Blocked oil or sweat glands.  Blocked and infected hair follicles.  A cyst that forms beneath your skin (sebaceous cyst) and becomes infected. What increases the risk? This condition is more likely to develop in people who:  Have a weak body defense system (immune system).  Have diabetes.  Have dry and irritated skin.  Get frequent injections or use illegal IV drugs.  Have a foreign body in a wound, such as a splinter.  Have problems with their lymph system or veins. What are the signs or symptoms? Symptoms of this condition include:  A painful, firm bump under the skin.  A bump with pus at the top. This may break through the skin and drain. Other symptoms include:  Redness surrounding the abscess site.  Warmth.  Swelling of the lymph nodes (glands) near the abscess.  Tenderness.  A sore on the skin. How is this diagnosed? This condition may be diagnosed based on:  A physical exam.  Your medical history.  A sample of pus. This may be used to find out what is causing the infection.  Blood tests.  Imaging tests, such as an ultrasound, CT scan, or MRI. How is this treated? A small abscess that drains on its own may not  need treatment. Treatment for larger abscesses may include:  Moist heat or heat pack applied to the area several times a day.  A procedure to drain the abscess (incision and drainage).  Antibiotic medicines. For a severe abscess, you may first get antibiotics through an IV and then change to antibiotics by mouth. Follow these instructions at home: Medicines  Take over-the-counter and prescription medicines only as told by your health care provider.  If you were prescribed an antibiotic medicine, take it as told by your health care provider. Do not stop taking the antibiotic even if you start to feel better.   Abscess care  If you have an abscess that has not drained, apply heat to the affected area. Use the heat source that your health care provider recommends, such as a moist heat pack or a heating pad. ? Place a towel between your skin and the heat source. ? Leave the heat on for 20-30 minutes. ? Remove the heat if your skin turns bright red. This is especially important if you are unable to feel pain, heat, or cold. You may have a greater risk of getting burned.  Follow instructions from your health care provider about how to take care of your abscess. Make sure you: ? Cover the abscess with a bandage (dressing). ? Change your dressing or gauze as told by your health care provider. ? Wash your hands with soap and water before you change the  dressing or gauze. If soap and water are not available, use hand sanitizer.  Check your abscess every day for signs of a worsening infection. Check for: ? More redness, swelling, or pain. ? More fluid or blood. ? Warmth. ? More pus or a bad smell.   General instructions  To avoid spreading the infection: ? Do not share personal care items, towels, or hot tubs with others. ? Avoid making skin contact with other people.  Keep all follow-up visits as told by your health care provider. This is important. Contact a health care provider if you  have:  More redness, swelling, or pain around your abscess.  More fluid or blood coming from your abscess.  Warm skin around your abscess.  More pus or a bad smell coming from your abscess.  A fever.  Muscle aches.  Chills or a general ill feeling. Get help right away if you:  Have severe pain.  See red streaks on your skin spreading away from the abscess. Summary  A skin abscess is an infected area on or under your skin that contains a collection of pus and other material.  A small abscess that drains on its own may not need treatment.  Treatment for larger abscesses may include having a procedure to drain the abscess and taking an antibiotic. This information is not intended to replace advice given to you by your health care provider. Make sure you discuss any questions you have with your health care provider. Document Revised: 06/30/2018 Document Reviewed: 04/22/2017 Elsevier Patient Education  2021 [employer].

## 2020-06-10 NOTE — Telephone Encounter (Signed)
Patient is scheduled with Dr.Burchette today.   Nurse Assessment Nurse: Lovena Le, RN, Savilla Turbyfill Date/Time Eilene Ghazi Time): 06/07/2020 12:51:27 PM Confirm and document reason for call. If symptomatic, describe symptoms. ---Caller states he has a cyst on his right groin that is causing pain 8/10 and burning, tender to touch. Size less then a quarter bigger then a nickel. Symptoms started on Monday. Have tried ice, lomitrin cream, and gold bond. No other symptoms. Does the patient have any new or worsening symptoms? ---Yes Will a triage be completed? ---Yes Related visit to physician within the last 2 weeks? ---No Does the PT have any chronic conditions? (i.e. diabetes, asthma, this includes High risk factors for pregnancy, etc.) ---No Is this a behavioral health or substance abuse call? ---No Guidelines Guideline Title Affirmed Question Affirmed Notes Nurse Date/Time (Eastern Time) Scrotal Pain [1] Pain comes and goes (intermittent) AND [2] present > 24 hours Lovena Le, RN, Kenechukwu Eckstein 06/07/2020 12:59:28 PM Disp. Time Eilene Ghazi Time) Disposition Final User 06/07/2020 1:07:55 PM See HCP within 4 Hours (or PCP triage) Yes Lovena Le, RN, Khanh Tanori Disposition Overriden: See PCP within 24 Hours Override Reason: Patient's symptoms need a higher level of care Caller Disagree/Comply Comply PLEASE NOTE: All timestamps contained within this report are represented as [place] Standard Time. CONFIDENTIALTY NOTICE: This fax transmission is intended only for the addressee. It contains information that is legally privileged, confidential or otherwise protected from use or disclosure. If you are not the intended recipient, you are strictly prohibited from reviewing, disclosing, copying using or disseminating any of this information or taking any action in reliance on or regarding this information. If you have received this fax in error, please notify us immediately by telephone so that we can arrange for its return to  [place]. Phone: [phone], Toll-Free: [phone], Fax: [phone] Page: 2 of 2 Call Id: [id number] [place] Understands Yes PreDisposition Call Doctor Care Advice Given Per Guideline SEE HCP (OR PCP TRIAGE) WITHIN 4 HOURS: * IF OFFICE WILL BE OPEN: You need to be seen within the next 3 or 4 hours. Call your doctor (or NP/PA) now or as soon as the office opens. CARE ADVICE given per Scrotal Pain (Adult) guideline. * You become worse CALL BACK IF: PAIN MEDICINES: * For pain relief, you can take either acetaminophen, ibuprofen, or naproxen. Referrals Warm transfer to backline

## 2020-06-10 NOTE — Progress Notes (Signed)
Established Patient Office Visit  Subjective:  Patient ID: Jonathon Cain, male    DOB: 1961/03/12  Age: 60 y.o. MRN: [id number]  CC:  Chief Complaint  Patient presents with  . Follow-up    HPI Jonathon Cain presents for follow-up for abscess right groin region.  Follow-up from incision and drainage on Friday.  He changed the outer dressing several times over the weekend secondary to drainage.  No fevers or chills.  Overall, feels much better with less pain.  No fevers or chills.  Past Medical History:  Diagnosis Date  . Allergy   . Bladder outlet obstruction   . Cancer (HCC)    Skin  . CHF (congestive heart failure) ([place])   . Coronary artery disease   . Hyperlipidemia   . Hypertension   . Retinitis pigmentosa     Past Surgical History:  Procedure Laterality Date  . ABDOMINAL AORTOGRAM W/LOWER EXTREMITY N/A 03/19/2020   Procedure: ABDOMINAL AORTOGRAM W/LOWER EXTREMITY;  Surgeon: Nigel Mormon, MD;  Location: [facility];  Service: Cardiovascular;  Laterality: N/A;  . CARDIAC CATHETERIZATION    . CATARACT EXTRACTION Bilateral 2004  . CORONARY BALLOON ANGIOPLASTY N/A 11/14/2019   Procedure: CORONARY BALLOON ANGIOPLASTY;  Surgeon: Nigel Mormon, MD;  Location: [facility];  Service: Cardiovascular;  Laterality: N/A;  . CORONARY STENT INTERVENTION N/A 07/26/2019   Procedure: CORONARY STENT INTERVENTION;  Surgeon: Adrian Prows, MD;  Location: [facility];  Service: Cardiovascular;  Laterality: N/A;  . CORONARY/GRAFT ACUTE MI REVASCULARIZATION N/A 07/26/2019   Procedure: CORONARY/GRAFT ACUTE MI REVASCULARIZATION;  Surgeon: Adrian Prows, MD;  Location: [facility];  Service: Cardiovascular;  Laterality: N/A;  . EYE SURGERY    . INTRAVASCULAR PRESSURE WIRE/FFR STUDY N/A 11/14/2019   Procedure: INTRAVASCULAR PRESSURE WIRE/FFR STUDY;  Surgeon: Nigel Mormon, MD;  Location: [facility];  Service: Cardiovascular;  Laterality: N/A;  .  INTRAVASCULAR ULTRASOUND/IVUS N/A 11/14/2019   Procedure: Intravascular Ultrasound/IVUS;  Surgeon: Nigel Mormon, MD;  Location: [facility];  Service: Cardiovascular;  Laterality: N/A;  . LEFT HEART CATH AND CORONARY ANGIOGRAPHY N/A 07/26/2019   Procedure: LEFT HEART CATH AND CORONARY ANGIOGRAPHY;  Surgeon: Adrian Prows, MD;  Location: [facility];  Service: Cardiovascular;  Laterality: N/A;  . LOWER EXTREMITY ANGIOGRAPHY Left 05/21/2020   Procedure: LOWER EXTREMITY ANGIOGRAPHY;  Surgeon: Nigel Mormon, MD;  Location: [facility];  Service: Cardiovascular;  Laterality: Left;  . PERIPHERAL VASCULAR BALLOON ANGIOPLASTY Right 03/19/2020   Procedure: PERIPHERAL VASCULAR BALLOON ANGIOPLASTY;  Surgeon: Nigel Mormon, MD;  Location: [facility];  Service: Cardiovascular;  Laterality: Right;  SFA  . PERIPHERAL VASCULAR BALLOON ANGIOPLASTY  05/21/2020   Procedure: PERIPHERAL VASCULAR BALLOON ANGIOPLASTY;  Surgeon: Nigel Mormon, MD;  Location: [facility];  Service: Cardiovascular;;  left SFA  . PERIPHERAL VASCULAR INTERVENTION Bilateral 03/19/2020   Procedure: PERIPHERAL VASCULAR INTERVENTION;  Surgeon: Nigel Mormon, MD;  Location: [facility];  Service: Cardiovascular;  Laterality: Bilateral;  External Iliac  . 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 Heart disease Mother   . Hyperlipidemia Mother   . Asthma Mother   . Heart disease Father   . Hypertension Father   . Heart disease Maternal Grandmother   . Heart disease Maternal Grandfather   . Heart disease Paternal  Grandmother   . Heart disease Paternal Grandfather     Social History   Socioeconomic History  . Marital status: Single    Spouse name: Not on file  . Number of children: 0  . Years of education: 16  . Highest education level: Bachelor's degree (e.g., BA, AB, BS)  Occupational History   . Not on file  Tobacco Use  . Smoking status: Former Smoker    Packs/day: 1.00    Years: 10.00    Pack years: 10.00    Types: Cigarettes    Quit date: 07/26/2019    Years since quitting: 0.8  . Smokeless tobacco: Never Used  Vaping Use  . Vaping Use: Never used  Substance and Sexual Activity  . Alcohol use: Yes    Comment: occasional  . Drug use: No  . Sexual activity: Not Currently  Other Topics Concern  . Not on file  Social History Narrative  . Not on file   Social Determinants of Health   Financial Resource Strain: Not on file  Food Insecurity: Not on file  Transportation Needs: Not on file  Physical Activity: Not on file  Stress: Not on file  Social Connections: Not on file  Intimate Partner Violence: Not on file    Outpatient Medications Prior to Visit  Medication Sig Dispense Refill  . acetaminophen (TYLENOL) 325 MG tablet Take 2 tablets (650 mg total) by mouth every 4 (four) hours as needed for headache or mild pain.    [name] Kitchen aspirin 81 MG chewable tablet Chew 1 tablet (81 mg total) by mouth daily. 30 tablet 11  . atorvastatin (LIPITOR) 80 MG tablet TAKE 1 TABLET(80 MG) BY MOUTH DAILY 90 tablet 0  . cetirizine (ZYRTEC) 10 MG tablet Take 10 mg by mouth daily as needed for allergies.    [name] Kitchen ENTRESTO 24-26 MG TAKE 1 TABLET BY MOUTH TWICE DAILY (Patient taking differently: Take 1 tablet by mouth 2 (two) times daily.) 60 tablet 1  . fluticasone (FLONASE) 50 MCG/ACT nasal spray Place 2 sprays into both nostrils daily as needed for allergies or rhinitis.     . furosemide (LASIX) 40 MG tablet TAKE 1 TABLET(40 MG) BY MOUTH DAILY (Patient taking differently: Take 40 mg by mouth daily.) 90 tablet 1  . metoprolol succinate (TOPROL-XL) 25 MG 24 hr tablet Take 0.5 tablets (12.5 mg total) by mouth every other day. 30 tablet 2  . neomycin-polymyxin-hydrocortisone (CORTISPORIN) 3.5-10000-1 OTIC suspension Apply 1-2 drops daily after soaking and cover with bandaid 10 mL 0  . nitroGLYCERIN  (NITROSTAT) 0.4 MG SL tablet Place 1 tablet (0.4 mg total) under the tongue every 5 (five) minutes as needed for chest pain. 25 tablet 2  . pantoprazole (PROTONIX) 40 MG tablet TAKE 1 TABLET(40 MG) BY MOUTH DAILY (Patient taking differently: Take 40 mg by mouth daily.) 30 tablet 2  . spironolactone (ALDACTONE) 25 MG tablet TAKE 1 TABLET(25 MG) BY MOUTH DAILY (Patient taking differently: Take 25 mg by mouth daily.) 90 tablet 1  . tamsulosin (FLOMAX) 0.4 MG CAPS capsule Take 0.4 mg by mouth daily as needed (urine flow).    . ticagrelor (BRILINTA) 90 MG TABS tablet Take 1 tablet (90 mg total) by mouth 2 (two) times daily. 60 tablet 11   No facility-administered medications prior to visit.    No Known Allergies  ROS Review of Systems  Constitutional: Negative for chills and fever.      Objective:    Physical Exam Vitals reviewed.  Cardiovascular:  Rate and Rhythm: Normal rate and regular rhythm.  Pulmonary:     Effort: Pulmonary effort is normal.     Breath sounds: Normal breath sounds.  Skin:    Comments: Right groin region is examined.  He has wound which is healing well.  There is some as expected surrounding induration but less erythema and no fluctuance and no significant purulent drainage.  Packing has already come out.  Neurological:     Mental Status: He is alert.     BP 110/70   Pulse 74   Ht _0  (1.778 m)   Wt 270 lb (122.5 kg)   SpO2 92%   BMI 38.74 kg/m  Wt Readings from Last 3 Encounters:  06/10/20 270 lb (122.5 kg)  06/07/20 270 lb (122.5 kg)  06/05/20 271 lb (122.9 kg)     Health Maintenance Due  Topic Date Due  . Hepatitis C Screening  Never done  . DTAP VACCINES (1) Never done  . HIV Screening  Never done  . DTaP/Tdap/Td (1 - Tdap) Never done  . TETANUS/TDAP  Never done  . COLONOSCOPY (Pts 45-24yr Insurance coverage will need to be confirmed)  Never done  . INFLUENZA VACCINE  Never done  . COVID-19 Vaccine (3 - Pfizer risk 4-dose series)  02/01/2020       Topic Date Due  . DTAP VACCINES (1) Never done    No results found for: TSH Lab Results  Component Value Date   WBC 16.4 (H) 05/17/2020   HGB 15.7 05/17/2020   HCT 46.8 05/17/2020   MCV 89 05/17/2020   PLT 241 05/17/2020   Lab Results  Component Value Date   NA 133 (L) 05/17/2020   K 5.0 05/17/2020   CHLORIDE 103 12/03/2016   CO2 19 (L) 05/17/2020   GLUCOSE 86 05/17/2020   BUN 24 05/17/2020   CREATININE 0.95 05/17/2020   BILITOT 1.1 02/19/2020   ALKPHOS 216 (H) 02/19/2020   AST 43 (H) 02/19/2020   ALT 55 (H) 02/19/2020   PROT 7.4 02/19/2020   ALBUMIN 3.9 02/19/2020   CALCIUM 9.0 05/17/2020   ANIONGAP 9 10/25/2019   EGFR >90 12/03/2016   GFR 77.54 02/19/2020   Lab Results  Component Value Date   CHOL 119 04/19/2020   Lab Results  Component Value Date   HDL 40 04/19/2020   Lab Results  Component Value Date   LDLCALC 64 04/19/2020   Lab Results  Component Value Date   TRIG 75 04/19/2020   Lab Results  Component Value Date   CHOLHDL 3.0 04/19/2020   Lab Results  Component Value Date   HGBA1C 5.5 07/26/2019      Assessment & Plan:   Problem List Items Addressed This Visit   None   Abscess right groin region with I&D on Friday.  Packing is already out and this appears to be healing well.  We explained that he will likely have some persistent induration possibly for few weeks.  Follow-up for any warmth, increased redness, pain, swelling, or other concerns  No orders of the defined types were placed in this encounter.   Follow-up: No follow-ups on file.    BCarolann Littler MD

## 2020-06-13 ENCOUNTER — Ambulatory Visit: Payer: Medicare HMO | Admitting: Podiatry

## 2020-06-13 ENCOUNTER — Encounter: Payer: Self-pay | Admitting: Podiatry

## 2020-06-13 ENCOUNTER — Other Ambulatory Visit: Payer: Self-pay

## 2020-06-13 DIAGNOSIS — I739 Peripheral vascular disease, unspecified: Secondary | ICD-10-CM

## 2020-06-13 DIAGNOSIS — L600 Ingrowing nail: Secondary | ICD-10-CM

## 2020-06-13 NOTE — Progress Notes (Signed)
  Subjective:  Patient ID: Haig Gerardo, male    DOB: 20-Dec-1960,  MRN: [id number]  Chief Complaint  Patient presents with  . Nail Problem    Nail check from an ingrown procedure     60 y.o. male returns for follow-up following ingrown nail procedure.  Has been leaving open to air Objective:  Physical Exam: warm, good capillary refill, he now has bilateral palpable dorsalis pedis and posterior tibial pulses.  Matricectomy sites are healing well  Assessment:   1. Ingrowing left great toenail   2. Ingrowing right great toenail   3. Peripheral arterial disease ([place])      Plan:  Patient was evaluated and treated and all questions answered.    Ingrown Nail, bilaterally -He is doing quite well.  Matricectomy sites are healing well.  He can leave open air.  Discontinue soaks over the next week.  Return as needed if there are issues    No follow-ups on file.

## 2020-07-02 ENCOUNTER — Encounter: Payer: Self-pay | Admitting: Registered Nurse

## 2020-07-02 ENCOUNTER — Other Ambulatory Visit: Payer: Self-pay

## 2020-07-02 ENCOUNTER — Ambulatory Visit (INDEPENDENT_AMBULATORY_CARE_PROVIDER_SITE_OTHER): Payer: Medicare HMO | Admitting: Registered Nurse

## 2020-07-02 VITALS — BP 136/72 | HR 58 | Temp 98.2°F | Resp 17 | Ht 70.0 in | Wt 270.2 lb

## 2020-07-02 DIAGNOSIS — D179 Benign lipomatous neoplasm, unspecified: Secondary | ICD-10-CM

## 2020-07-02 DIAGNOSIS — Z7689 Persons encountering health services in other specified circumstances: Secondary | ICD-10-CM

## 2020-07-02 DIAGNOSIS — I5020 Unspecified systolic (congestive) heart failure: Secondary | ICD-10-CM | POA: Diagnosis not present

## 2020-07-02 DIAGNOSIS — I2109 ST elevation (STEMI) myocardial infarction involving other coronary artery of anterior wall: Secondary | ICD-10-CM | POA: Diagnosis not present

## 2020-07-02 DIAGNOSIS — N320 Bladder-neck obstruction: Secondary | ICD-10-CM | POA: Diagnosis not present

## 2020-07-02 DIAGNOSIS — Z85828 Personal history of other malignant neoplasm of skin: Secondary | ICD-10-CM

## 2020-07-02 MED ORDER — TAMSULOSIN HCL 0.4 MG PO CAPS: 0.4000 mg | ORAL_CAPSULE | Freq: Every day | ORAL | 1 refills | Status: DC | PRN

## 2020-07-02 NOTE — Progress Notes (Signed)
Established Patient Office Visit  Subjective:  Patient ID: Jonathon Cain, male    DOB: [birth year]  Age: 60 y.o. MRN: [id number]  CC:  Chief Complaint  Patient presents with  . Establish Care    Pt here to establish care, has continuous SOB since Heart attack on may 5th 2021.   Would like a referral to [facility] for fatty tumor has already had one removed    HPI Jonathon Cain presents for visit to est care.  Histories and recent labs reviewed with patient. Notable portions of history include:  STEMI: LAD on 07/26/19. Now CHF. Managed by Dr. Virgina Jock. Last EF showed 36%. Pt feeling better since initial MI but acknowledges still more work to be done. Strong fam hx of heart disease. Next appt with Cardiology in June, after that will go to follow up q52mo Retinitis Pigmentosa: dx in 2006. 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   Skin Cancer: long ago, excised, no concerning lesions at this time. Does have a lipoma he would like removed, requesting referral to dermatology.  Bladder outlet obstruction: taking diuretics for CHF as well as flomax, managed okay. Some leaking of urine but admits his Flomax is 62 years old, needs refill.   HTN and HLD: managed by cardiology.  Weight: he does express that he is interested in losing weight. I agree that this would benefit him medically. Given his cardiac status, exercise sufficient to achieve weight loss towards his goal of 220-230lbs would be difficult.   Past Medical History:  Diagnosis Date  . Allergy   . Bladder outlet obstruction   . Cancer (HCC)    Skin  . CHF (congestive heart failure) ([place]   . Coronary artery disease   . Hyperlipidemia   . Hypertension   . Retinitis pigmentosa     Past Surgical History:  Procedure Laterality Date  . ABDOMINAL AORTOGRAM W/LOWER EXTREMITY N/A 03/19/2020   Procedure: ABDOMINAL  AORTOGRAM W/LOWER EXTREMITY;  Surgeon: PNigel Mormon MD;  [facility];  Service: Cardiovascular;  Laterality: N/A;  . CARDIAC CATHETERIZATION    . CATARACT EXTRACTION Bilateral 2004  . CORONARY BALLOON ANGIOPLASTY N/A 11/14/2019   Procedure: CORONARY BALLOON ANGIOPLASTY;  Surgeon: PNigel Mormon MD;  [facility];  Service: Cardiovascular;  Laterality: N/A;  . CORONARY STENT INTERVENTION N/A 07/26/2019   Procedure: CORONARY STENT INTERVENTION;  Surgeon: GAdrian Prows MD;  [facility];  Service: Cardiovascular;  Laterality: N/A;  . CORONARY/GRAFT ACUTE MI REVASCULARIZATION N/A 07/26/2019   Procedure: CORONARY/GRAFT ACUTE MI REVASCULARIZATION;  Surgeon: GAdrian Prows MD;  [facility];  Service: Cardiovascular;  Laterality: N/A;  . EYE SURGERY    . INTRAVASCULAR PRESSURE WIRE/FFR STUDY N/A 11/14/2019   Procedure: INTRAVASCULAR PRESSURE WIRE/FFR STUDY;  Surgeon: PNigel Mormon MD;  [facility];  Service: Cardiovascular;  Laterality: N/A;  . INTRAVASCULAR ULTRASOUND/IVUS N/A 11/14/2019   Procedure: Intravascular Ultrasound/IVUS;  Surgeon: PNigel Mormon MD;  [facility];  Service: Cardiovascular;  Laterality: N/A;  . LEFT HEART CATH AND CORONARY ANGIOGRAPHY N/A 07/26/2019   Procedure: LEFT HEART CATH AND CORONARY ANGIOGRAPHY;  Surgeon: GAdrian Prows MD;  [place];  Service: Cardiovascular;  Laterality: N/A;  . LOWER EXTREMITY ANGIOGRAPHY Left 05/21/2020   Procedure: LOWER EXTREMITY ANGIOGRAPHY;  Surgeon: PVirgina Jock  Reynold Bowen, MD;  Location: [place] [facility];  Service: Cardiovascular;  Laterality: Left;  . PERIPHERAL VASCULAR BALLOON ANGIOPLASTY Right 03/19/2020   Procedure: PERIPHERAL VASCULAR BALLOON ANGIOPLASTY;  Surgeon: Nigel Mormon, MD;  Location: [place] [facility];  Service: Cardiovascular;  Laterality: Right;  SFA  . PERIPHERAL VASCULAR BALLOON  ANGIOPLASTY  05/21/2020   Procedure: PERIPHERAL VASCULAR BALLOON ANGIOPLASTY;  Surgeon: Nigel Mormon, MD;  Location: [place] [facility];  Service: Cardiovascular;;  left SFA  . PERIPHERAL VASCULAR INTERVENTION Bilateral 03/19/2020   Procedure: PERIPHERAL VASCULAR INTERVENTION;  Surgeon: Nigel Mormon, MD;  Location: [place] [facility];  Service: Cardiovascular;  Laterality: Bilateral;  External Iliac  . 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 Heart disease Mother   . Hyperlipidemia Mother   . Asthma Mother   . Heart disease Father   . Hypertension Father   . Heart disease Maternal Grandmother   . Heart disease Maternal Grandfather   . Heart disease Paternal Grandmother   . Heart disease Paternal Grandfather     Social History   Socioeconomic History  . Marital status: Single    Spouse name: Not on file  . Number of children: 0  . Years of education: 16  . Highest education level: Bachelor's degree (e.g., BA, AB, BS)  Occupational History  . Not on file  Tobacco Use  . Smoking status: Former Smoker    Packs/day: 1.00    Years: 10.00    Pack years: 10.00    Types: Cigarettes    Quit date: 07/26/2019    Years since quitting: 0.9  . Smokeless tobacco: Never Used  Vaping Use  . Vaping Use: Never used  Substance and Sexual Activity  . Alcohol use: Yes    Comment: occasional  . Drug use: No  . Sexual activity: Not Currently  Other Topics Concern  . Not on file  Social History Narrative  . Not on file   Social Determinants of Health   Financial Resource Strain: Not on file  Food Insecurity: Not on file  Transportation Needs: Not on file  Physical Activity: Not on file  Stress: Not on file  Social Connections: Not on file  Intimate Partner Violence: Not on file    Outpatient Medications Prior to Visit  Medication Sig Dispense Refill  . acetaminophen (TYLENOL) 325 MG tablet Take  2 tablets (650 mg total) by mouth every 4 (four) hours as needed for headache or mild pain.    [name] Kitchen aspirin 81 MG chewable tablet Chew 1 tablet (81 mg total) by mouth daily. 30 tablet 11  . atorvastatin (LIPITOR) 80 MG tablet TAKE 1 TABLET(80 MG) BY MOUTH DAILY 90 tablet 0  . cetirizine (ZYRTEC) 10 MG tablet Take 10 mg by mouth daily as needed for allergies.    [name] Kitchen ENTRESTO 24-26 MG TAKE 1 TABLET BY MOUTH TWICE DAILY (Patient taking differently: Take 1 tablet by mouth 2 (two) times daily.) 60 tablet 1  . fluticasone (FLONASE) 50 MCG/ACT nasal spray Place 2 sprays into both nostrils daily as needed for allergies or rhinitis.     . furosemide (LASIX) 40 MG tablet TAKE 1 TABLET(40 MG) BY MOUTH DAILY (Patient taking differently: Take 40 mg by mouth daily.) 90 tablet 1  . metoprolol succinate (TOPROL-XL) 25 MG 24 hr tablet Take 0.5 tablets (12.5 mg total) by mouth every other day. 30 tablet 2  .  neomycin-polymyxin-hydrocortisone (CORTISPORIN) 3.5-10000-1 OTIC suspension Apply 1-2 drops daily after soaking and cover with bandaid 10 mL 0  . nitroGLYCERIN (NITROSTAT) 0.4 MG SL tablet Place 1 tablet (0.4 mg total) under the tongue every 5 (five) minutes as needed for chest pain. 25 tablet 2  . pantoprazole (PROTONIX) 40 MG tablet TAKE 1 TABLET(40 MG) BY MOUTH DAILY (Patient taking differently: Take 40 mg by mouth daily.) 30 tablet 2  . spironolactone (ALDACTONE) 25 MG tablet TAKE 1 TABLET(25 MG) BY MOUTH DAILY (Patient taking differently: Take 25 mg by mouth daily.) 90 tablet 1  . ticagrelor (BRILINTA) 90 MG TABS tablet Take 1 tablet (90 mg total) by mouth 2 (two) times daily. 60 tablet 11  . tamsulosin (FLOMAX) 0.4 MG CAPS capsule Take 0.4 mg by mouth daily as needed (urine flow).     No facility-administered medications prior to visit.    No Known Allergies  ROS Review of Systems  Constitutional: Negative.   HENT: Negative.   Eyes: Negative.   Respiratory: Negative.   Cardiovascular: Negative.    Gastrointestinal: Negative.   Genitourinary: Negative.   Musculoskeletal: Negative.   Skin: Negative.   Neurological: Negative.   Psychiatric/Behavioral: Negative.   All other systems reviewed and are negative.     Objective:    Physical Exam Constitutional:      General: He is not in acute distress.    Appearance: Normal appearance. He is normal weight. He is not ill-appearing, toxic-appearing or diaphoretic.  Cardiovascular:     Rate and Rhythm: Normal rate and regular rhythm.     Heart sounds: Normal heart sounds. No murmur heard. No friction rub. No gallop.   Pulmonary:     Effort: Pulmonary effort is normal. No respiratory distress.     Breath sounds: Normal breath sounds. No stridor. No wheezing, rhonchi or rales.  Chest:     Chest wall: No tenderness.  Skin:    General: Skin is warm and dry.     Findings: Lesion (lipoma) present.  Neurological:     General: No focal deficit present.     Mental Status: He is alert and oriented to person, place, and time. Mental status is at baseline.  Psychiatric:        Mood and Affect: Mood normal.        Behavior: Behavior normal.        Thought Content: Thought content normal.        Judgment: Judgment normal.     BP 136/72   Pulse (!) 58   Temp 98.2 F (36.8 C) (Temporal)   Resp 17   Ht 5' 10" (1.778 m)   Wt 270 lb 3.2 oz (122.6 kg)   SpO2 97%   BMI 38.77 kg/m  Wt Readings from Last 3 Encounters:  07/02/20 270 lb 3.2 oz (122.6 kg)  06/10/20 270 lb (122.5 kg)  06/07/20 270 lb (122.5 kg)     There are no preventive care reminders to display for this patient.  There are no preventive care reminders to display for this patient.  No results found for: TSH Lab Results  Component Value Date   WBC 16.4 (H) 05/17/2020   HGB 15.7 05/17/2020   HCT 46.8 05/17/2020   MCV 89 05/17/2020   PLT 241 05/17/2020   Lab Results  Component Value Date   NA 133 (L) 05/17/2020   K 5.0 05/17/2020   CHLORIDE 103 12/03/2016    CO2 19 (L) 05/17/2020   GLUCOSE 86 05/17/2020  BUN 24 05/17/2020   CREATININE 0.95 05/17/2020   BILITOT 1.1 02/19/2020   ALKPHOS 216 (H) 02/19/2020   AST 43 (H) 02/19/2020   ALT 55 (H) 02/19/2020   PROT 7.4 02/19/2020   ALBUMIN 3.9 02/19/2020   CALCIUM 9.0 05/17/2020   ANIONGAP 9 10/25/2019   EGFR >90 12/03/2016   GFR 77.54 02/19/2020   Lab Results  Component Value Date   CHOL 119 04/19/2020   Lab Results  Component Value Date   HDL 40 04/19/2020   Lab Results  Component Value Date   LDLCALC 64 04/19/2020   Lab Results  Component Value Date   TRIG 75 04/19/2020   Lab Results  Component Value Date   CHOLHDL 3.0 04/19/2020   Lab Results  Component Value Date   HGBA1C 5.5 07/26/2019      Assessment & Plan:   Problem List Items Addressed This Visit      Cardiovascular and Mediastinum   Acute ST elevation myocardial infarction (STEMI) of anterolateral wall (HCC)   HFrEF (heart failure with reduced ejection fraction) ([place])    Other Visit Diagnoses    Lipoma, unspecified site    -  Primary   Relevant Orders   Ambulatory referral to Dermatology   History of skin cancer       Relevant Orders   Ambulatory referral to Dermatology   Encounter to establish care       Bladder outlet obstruction       Relevant Medications   tamsulosin (FLOMAX) 0.4 MG CAPS capsule      Meds ordered this encounter  Medications  . tamsulosin (FLOMAX) 0.4 MG CAPS capsule    Sig: Take 1 capsule (0.4 mg total) by mouth daily as needed (urine flow).    Dispense:  90 capsule    Refill:  1    Order Specific Question:   Supervising Provider    Answer:   Carlota Raspberry, JEFFREY R [[id number]]    Follow-up: Return in about 6 months (around 01/01/2021) for chronic conditions, late September.   PLAN  Refer to derm for hx of skin cancer and apparent lipoma  Refill tamsulosin  Follow up q 6 mo staggered with cardiology  Will ask Dr. Virgina Jock his thoughts on GLP-1s for weight loss in a patient  with Mr. Mirabile CV history without established dx of t2dm.  Recent labs reviewed, no need to draw today  Patient encouraged to call clinic with any questions, comments, or concerns.  Maximiano Coss, NP

## 2020-07-20 ENCOUNTER — Other Ambulatory Visit: Payer: Self-pay | Admitting: Cardiology

## 2020-07-20 DIAGNOSIS — I25118 Atherosclerotic heart disease of native coronary artery with other forms of angina pectoris: Secondary | ICD-10-CM

## 2020-07-30 DIAGNOSIS — R6889 Other general symptoms and signs: Secondary | ICD-10-CM | POA: Diagnosis not present

## 2020-09-02 ENCOUNTER — Other Ambulatory Visit: Payer: Self-pay

## 2020-09-02 ENCOUNTER — Ambulatory Visit: Payer: Medicare HMO | Admitting: Cardiology

## 2020-09-02 ENCOUNTER — Encounter: Payer: Self-pay | Admitting: Cardiology

## 2020-09-02 VITALS — BP 106/68 | HR 71 | Temp 98.2°F | Resp 16 | Ht 70.0 in | Wt 275.0 lb

## 2020-09-02 DIAGNOSIS — I739 Peripheral vascular disease, unspecified: Secondary | ICD-10-CM | POA: Diagnosis not present

## 2020-09-02 DIAGNOSIS — I5020 Unspecified systolic (congestive) heart failure: Secondary | ICD-10-CM

## 2020-09-02 DIAGNOSIS — I25118 Atherosclerotic heart disease of native coronary artery with other forms of angina pectoris: Secondary | ICD-10-CM | POA: Diagnosis not present

## 2020-09-02 MED ORDER — RIVAROXABAN 2.5 MG PO TABS: 2.5000 mg | ORAL_TABLET | Freq: Two times a day (BID) | ORAL | 2 refills | Status: DC

## 2020-09-02 MED ORDER — CLOPIDOGREL BISULFATE 75 MG PO TABS: 75.0000 mg | ORAL_TABLET | Freq: Every day | ORAL | 3 refills | Status: DC

## 2020-09-02 NOTE — Progress Notes (Signed)
Patient referred by Janeece Agee, NP for CAD  Subjective:   Jonathon Cain, male    DOB: 11-23-1960, 60 y.o.   MRN: [id number]   Chief Complaint  Patient presents with   HFrEF (heart failure with reduced ejection fraction)    Peripheral arterial disease    Follow-up     HPI  60 y.o. 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 Today, patiant tells me that he has retrosternal ches ttightness that is almost constant 24X7. Pain is no worse with walking. He is able to do mre with walking now thatn ever before since his MI, according to him. Dyspnea is class II.  On a separate note, he has chronic diarrhea, which makes him miss rehab visits at times.  Current Outpatient Medications on File Prior to Visit  Medication Sig Dispense Refill   acetaminophen (TYLENOL) 325 MG tablet Take 2 tablets (650 mg total) by mouth every 4 (four) hours as needed for headache or mild pain.     aspirin 81 MG chewable tablet Chew 1 tablet (81 mg total) by mouth daily. 30 tablet 11   atorvastatin (LIPITOR) 80 MG tablet TAKE 1 TABLET(80 MG) BY MOUTH DAILY 90 tablet 0   cetirizine (ZYRTEC) 10 MG tablet Take 10 mg by mouth daily as needed for allergies.     ENTRESTO 24-26 MG TAKE 1 TABLET BY MOUTH TWICE DAILY 60 tablet 1   fluticasone (FLONASE) 50 MCG/ACT nasal spray Place 2 sprays into both nostrils daily as needed for allergies or rhinitis.      furosemide (LASIX) 40 MG tablet TAKE 1 TABLET(40 MG) BY MOUTH DAILY (Patient taking differently: Take 40 mg by mouth daily.) 90 tablet 1   metoprolol succinate (TOPROL-XL) 25 MG 24 hr tablet Take 0.5 tablets (12.5 mg total) by mouth every other day. 30  tablet 2   neomycin-polymyxin-hydrocortisone (CORTISPORIN) 3.5-10000-1 OTIC suspension Apply 1-2 drops daily after soaking and cover with bandaid 10 mL 0   nitroGLYCERIN (NITROSTAT) 0.4 MG SL tablet Place 1 tablet (0.4 mg total) under the tongue every 5 (five) minutes as needed for chest pain. 25 tablet 2   pantoprazole (PROTONIX) 40 MG tablet TAKE 1 TABLET(40 MG) BY MOUTH DAILY 30 tablet 2   spironolactone (ALDACTONE) 25 MG tablet TAKE 1 TABLET(25 MG) BY MOUTH DAILY (Patient taking differently: Take 25 mg by mouth daily.) 90 tablet 1   tamsulosin (FLOMAX) 0.4 MG CAPS capsule Take 1 capsule (0.4 mg total) by mouth daily as needed (urine flow). 90 capsule 1   ticagrelor (BRILINTA) 90 MG TABS tablet Take 1 tablet (90 mg total) by mouth 2 (two) times daily. 60 tablet 11   No current facility-administered medications on file prior to visit.    Cardiovascular and other pertinent studies:  PV intervention 05/21/2020: Left SFA CTO Successful retrograde revascularization with 5.0X150 mm Ranger DCB  EKG 05/28/2020: Sinus rhythm 51 bpm First degree A-V block  Left anterior fascicular block Poor R-wave progression Nonspecific ST changes  05/21/2020: Left SFA CTO Successful retrograde revascularization with 5.0X150 mm Ranger DCB   ABI 04/17/2020:  This exam reveals normal perfusion of the right lower extremity (ABI  1.00). There is mildly abnormal biphasic waveform at the right ankle.  This exam  reveals moderately decreased perfusion of the left lower  extremity, noted at the post tibial artery level (ABI 0.75). Severely  abnormal monophasic waveform at the left ankle.  Compared to 03/07/2020, right ABI 0.75 and left ABI 0.66.  Peripheral intervention 03/19/2020: Rt CIA 80% stenosis Lt CIA 95% stenosis Rt SFA short segment of CTO 3 vessel below the knee runoff Lt SFA short segment of CTO Occluded Lt ATA with 2 vessel below the knee runoff   PTA Rt SFA 5.0X80 mm In.Pact drug coated  balloon PTA and stenting Rt CIA 9.0X40 mm Eluvia drug eluting stent PTA and stenting Rt CIA 10.0X40 mm Eluvia drug eluting stent   Will stage Lt SFA revascularization, if clinically indicated  EKG 02/02/2020: Junctional rhythm/einus bradycardia 42 bpm Left anterolateral infarct Left anterior fascicular block No ischemic  Cardiac MRI 02/01/2020: 1. Subendocardial late gadolinium enhancement consistent with prior infarct in LAD territory. There is >50% transmural LGE suggesting nonviability in mid anterior/anteroseptal walls, apical anterior/septal/inferior walls, and apex. 2. Mild LV dilatation with moderate systolic function (EF 46%). Akinesis of mid to apical anterior, mid anteroseptal, apical septal, apical inferior, and apex. 3.  Normal RV size and systolic function (EF 56%) 4.  Moderate mitral regurgitation (regurgitant fraction 24%) 5.  Moderate right pleural effusion   Coronary angiography 11/14/2019: LM: Diffuse 30% disease. IVUS MLA 7.3 mm2,. dFR 1.0 LAD: Patent prox LAD stent with udner expansion        Optimization with 3.5X15 mm Haven balloon at 22 atm        Excellent expansion as seen on IVUS        TIMI flow II-->III        Diag: Mod diffuse disease LCx: 80% ostial OM1 stenosis. dFR 0.98 RCA: Not engaged today  Echocardiogram 10/24/2019:  Left ventricle cavity is mildly dilated. Distal anteroseptal and apical  akinesis. LV endocardial border not well visualized to accurately asses  EF, probably around 30%. Recommend alternate imaging modality to  accurately assess EF.   Doppler evidence of grade III (restrictive)  diastolic dysfunction.  Left atrial cavity is severely dilated.  Moderate (Grade III) mitral regurgitation.  Inadequate TR jet to estimate PASP. Estimated RA pressure 8 mmHg.  No significant change compared to previous study on 07/27/2019.   Left Heart Catheterization 07/26/19 (Dr. Einar Gip):  LM: Mild diffuse disease LAD: Very large caliber vessel,  occluded at the proximal end.  Gives origin to high D1 which is large.  Successful PTCA and stenting of the proximal LAD overlapping D1, 3.5 x 24 mm Synergy DES deployed at 12 atmospheric pressure for 60 S. 100% to 0% with TIMI 0 to TIMI-3 flow. LCx: Large vessel, giving origin to a very large OM1 which is secondary branches.  Ostium of OM1 has 80% stenosis. RCA: Mild diffuse disease, distal RCA 15 to 20% stenosis, PDA 50% stenosis.   Recommendation: Patient will need aggressive risk factor modification, dual antiplatelet therapy with aspirin and Brilinta for a year, will be watched carefully in the intensive care unit for preshock, suspect his EDP is extremely high at the end of the procedure with excessive contrast use.  Will monitor for any contrast nephropathy as well.  Recent labs: 04/19/2020: Chol 119, TG 75, HDL 40, LDL 64  02/19/2020: Glucose 90, BUN/Cr 21/1.04. EGFR 77. Na/K 136/4.0.  AST/ALT 43/55, AlKP 216 H/H 16/50. MCV 87. Platelets 258   11/02/2019: Glucose 85, BUN/Cr 15/1.18. EGFR 66. Na/K 138/4.7.  H/H 14/46. MCV 91. Platelets 222  08/17/2019: Glucose  90, BUN/Cr 17/1.21. EGFR 65. Na/K 136/4.9.  H/H 14/44. MCV 90. Platelets 289  07/26/2019: Glucose 122, BUN/Cr 9/0.9. EGFR 60. H/H 16.1/49.0. MCV 90.2. Platelets 240 HbA1C 5.5% Chol 230, TG 95, HDL 36, LDL 175   Review of Systems  Cardiovascular:  Positive for chest pain. Negative for dyspnea on exertion, leg swelling, palpitations and syncope.  Gastrointestinal:  Positive for diarrhea.        Vitals:   09/02/20 1308  BP: 106/68  Pulse: 71  Resp: 16  Temp: 98.2 F (36.8 C)  SpO2: 95%     Body mass index is 39.46 kg/m. Filed Weights   09/02/20 1308  Weight: 275 lb (124.7 kg)      Objective:   Physical Exam Vitals and nursing note reviewed.  Constitutional:      General: He is not in acute distress. Neck:     Vascular: No JVD.  Cardiovascular:     Rate and Rhythm: Normal rate and regular rhythm.      Heart sounds: Normal heart sounds. No murmur heard. Pulmonary:     Effort: Pulmonary effort is normal.     Breath sounds: Normal breath sounds. No wheezing or rales.  Musculoskeletal:     Right lower leg: No edema.     Left lower leg: No edema.         Assessment & Recommendations:   60 y.o. 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 Residual mild below the knee disease Stop Aspirin/Brilinta and start Plavix and Xarelto 2.5 mg bid, instead.  CAD: Constant chest pain.  Will obtain exercise nuclear stress test and echocardiogram.   S/p primary PCI to culprit prox LAD (07/2019) dFR negative 80% stenosis in OM1 (32440) LAD stent optimized under IVUS (10/2019) Stop Aspirin/Brilinta and start Plavix and Xarelto 2.5 mg bid, instead. Continue Lipitor 80. LDL down to 64.  Ischemic cardiomyopathy: >50% LGE in LAD territory. LVEF 36% Entesto 24-26 mg bid, spironolactone 25 mg daily, metoprolol succinate preferred for milligrams daily, Lasix as needed.  F/u after tests   Nigel Mormon, MD [facility] [place] Pager: [phone] Office: [phone]

## 2020-09-03 ENCOUNTER — Ambulatory Visit (INDEPENDENT_AMBULATORY_CARE_PROVIDER_SITE_OTHER): Payer: Medicare HMO | Admitting: Dermatology

## 2020-09-03 ENCOUNTER — Encounter: Payer: Self-pay | Admitting: Dermatology

## 2020-09-03 DIAGNOSIS — L821 Other seborrheic keratosis: Secondary | ICD-10-CM | POA: Diagnosis not present

## 2020-09-03 DIAGNOSIS — C44619 Basal cell carcinoma of skin of left upper limb, including shoulder: Secondary | ICD-10-CM

## 2020-09-03 DIAGNOSIS — L720 Epidermal cyst: Secondary | ICD-10-CM | POA: Diagnosis not present

## 2020-09-03 DIAGNOSIS — C4491 Basal cell carcinoma of skin, unspecified: Secondary | ICD-10-CM

## 2020-09-03 DIAGNOSIS — D171 Benign lipomatous neoplasm of skin and subcutaneous tissue of trunk: Secondary | ICD-10-CM | POA: Diagnosis not present

## 2020-09-03 DIAGNOSIS — D485 Neoplasm of uncertain behavior of skin: Secondary | ICD-10-CM | POA: Diagnosis not present

## 2020-09-03 DIAGNOSIS — C439 Malignant melanoma of skin, unspecified: Secondary | ICD-10-CM

## 2020-09-03 DIAGNOSIS — Z1283 Encounter for screening for malignant neoplasm of skin: Secondary | ICD-10-CM

## 2020-09-03 DIAGNOSIS — D0359 Melanoma in situ of other part of trunk: Secondary | ICD-10-CM | POA: Diagnosis not present

## 2020-09-03 HISTORY — DX: Malignant melanoma of skin, unspecified: C43.9

## 2020-09-03 HISTORY — DX: Basal cell carcinoma of skin, unspecified: C44.91

## 2020-09-03 NOTE — Patient Instructions (Signed)

## 2020-09-07 ENCOUNTER — Encounter: Payer: Self-pay | Admitting: Dermatology

## 2020-09-07 NOTE — Addendum Note (Signed)
Addended by: Lavonna Monarch on: 09/07/2020 11:50 AM   Modules accepted: Level of Service

## 2020-09-07 NOTE — Progress Notes (Signed)
   Follow-Up Visit   Subjective  Jonathon Cain is a 60 y.o. male who presents for the following: Annual Exam (Left shoulder lipoma x years also cyst on left neck sometimes drains ).  General skin examination, several cysts, dark spots on torso Location:  Duration:  Quality:  Associated Signs/Symptoms: Modifying Factors:  Severity:  Timing: Context:   Objective  Well appearing patient in no apparent distress; mood and affect are within normal limits. Discussed Tomasa Hose about weight loss with patient.   Left Upper Back Black 4 mm macule, dermoscopy amorphous     Left Breast Black 5 mm macule, dermoscopy amorphous     Left Upper Arm - Posterior     Left Postauricular Sulcus 1 cm noninflamed wide deep dermal nodule  Left Upper Back Doughy 4 cm subcutaneous nodule.    A full examination was performed including scalp, head, eyes, ears, nose, lips, neck, chest, axillae, abdomen, back, buttocks, bilateral upper extremities, bilateral lower extremities, hands, feet, fingers, toes, fingernails, and toenails. All findings within normal limits unless otherwise noted below.   Assessment & Plan    Neoplasm of uncertain behavior of skin (3) Left Upper Back  Skin / nail biopsy Type of biopsy: tangential   Informed consent: discussed and consent obtained   Timeout: patient name, date of birth, surgical site, and procedure verified   Anesthesia: the lesion was anesthetized in a standard fashion   Anesthetic:  1% lidocaine w/ epinephrine 1-100,000 local infiltration Instrument used: flexible razor blade   Hemostasis achieved with: ferric subsulfate   Outcome: patient tolerated procedure well   Post-procedure details: wound care instructions given    Specimen 1 - Surgical pathology Differential Diagnosis: scc vs bcc  Check Margins: No  Left Breast  Skin / nail biopsy Type of biopsy: tangential   Informed consent: discussed and consent obtained   Timeout:  patient name, date of birth, surgical site, and procedure verified   Anesthesia: the lesion was anesthetized in a standard fashion   Anesthetic:  1% lidocaine w/ epinephrine 1-100,000 local infiltration Instrument used: flexible razor blade   Hemostasis achieved with: ferric subsulfate   Outcome: patient tolerated procedure well   Post-procedure details: wound care instructions given    Specimen 2 - Surgical pathology Differential Diagnosis: scc vs bcc  Check Margins: No  Left Upper Arm - Posterior  Skin / nail biopsy Type of biopsy: tangential   Informed consent: discussed and consent obtained   Timeout: patient name, date of birth, surgical site, and procedure verified   Anesthesia: the lesion was anesthetized in a standard fashion   Anesthetic:  1% lidocaine w/ epinephrine 1-100,000 local infiltration Instrument used: flexible razor blade   Hemostasis achieved with: ferric subsulfate   Outcome: patient tolerated procedure well   Post-procedure details: wound care instructions given    Specimen 3 - Surgical pathology Differential Diagnosis: scc vs bcc  Check Margins: No  Epidermal cyst Left Postauricular Sulcus  Patient can schedule 30-minute surgery if he desires removal.  Screening for malignant neoplasm of skin  Lipoma of torso Left Upper Back  Will refer to general surgeon if he decides he wants this removed.      I, Lavonna Monarch, MD, have reviewed all documentation for this visit.  The documentation on 09/07/20 for the exam, diagnosis, procedures, and orders are all accurate and complete.

## 2020-09-10 ENCOUNTER — Telehealth (INDEPENDENT_AMBULATORY_CARE_PROVIDER_SITE_OTHER): Payer: Medicare HMO | Admitting: Dermatology

## 2020-09-10 DIAGNOSIS — H469 Unspecified optic neuritis: Secondary | ICD-10-CM | POA: Diagnosis not present

## 2020-09-10 DIAGNOSIS — D8989 Other specified disorders involving the immune mechanism, not elsewhere classified: Secondary | ICD-10-CM | POA: Diagnosis not present

## 2020-09-10 DIAGNOSIS — D045 Carcinoma in situ of skin of trunk: Secondary | ICD-10-CM

## 2020-09-10 DIAGNOSIS — H5362 Acquired night blindness: Secondary | ICD-10-CM | POA: Diagnosis not present

## 2020-09-10 DIAGNOSIS — C44619 Basal cell carcinoma of skin of left upper limb, including shoulder: Secondary | ICD-10-CM

## 2020-09-10 DIAGNOSIS — Z1589 Genetic susceptibility to other disease: Secondary | ICD-10-CM | POA: Diagnosis not present

## 2020-09-10 DIAGNOSIS — H3552 Pigmentary retinal dystrophy: Secondary | ICD-10-CM | POA: Diagnosis not present

## 2020-09-10 DIAGNOSIS — H3500 Unspecified background retinopathy: Secondary | ICD-10-CM | POA: Diagnosis not present

## 2020-09-10 DIAGNOSIS — R6889 Other general symptoms and signs: Secondary | ICD-10-CM | POA: Diagnosis not present

## 2020-09-10 DIAGNOSIS — H53453 Other localized visual field defect, bilateral: Secondary | ICD-10-CM | POA: Diagnosis not present

## 2020-09-10 DIAGNOSIS — H30123 Disseminated chorioretinal inflammation, peripheral, bilateral: Secondary | ICD-10-CM | POA: Diagnosis not present

## 2020-09-10 NOTE — Telephone Encounter (Signed)
Mr. 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  He is scheduled July 28 for an elective 1 hour surgery for 2 cysts and he actually suggested having the melanoma done instead at that time and I think this is a splendid idea.  All of his questions relating to the diagnosis were reviewed in detail.  He has my cell phone number and knows he can call me back at anytime with any questions.

## 2020-09-18 ENCOUNTER — Ambulatory Visit: Payer: Medicare HMO

## 2020-09-18 ENCOUNTER — Other Ambulatory Visit: Payer: Self-pay

## 2020-09-18 ENCOUNTER — Other Ambulatory Visit: Payer: Self-pay | Admitting: Cardiology

## 2020-09-18 DIAGNOSIS — I25118 Atherosclerotic heart disease of native coronary artery with other forms of angina pectoris: Secondary | ICD-10-CM | POA: Diagnosis not present

## 2020-09-18 DIAGNOSIS — I5020 Unspecified systolic (congestive) heart failure: Secondary | ICD-10-CM

## 2020-09-18 DIAGNOSIS — R609 Edema, unspecified: Secondary | ICD-10-CM

## 2020-09-18 MED ORDER — FUROSEMIDE 40 MG PO TABS: 40.0000 mg | ORAL_TABLET | Freq: Every day | ORAL | 1 refills | Status: DC

## 2020-10-03 ENCOUNTER — Encounter: Payer: Self-pay | Admitting: Cardiology

## 2020-10-03 ENCOUNTER — Ambulatory Visit: Payer: Medicare HMO | Admitting: Cardiology

## 2020-10-03 ENCOUNTER — Other Ambulatory Visit: Payer: Self-pay

## 2020-10-03 VITALS — BP 123/75 | HR 73 | Temp 98.1°F | Resp 16 | Ht 70.0 in | Wt 273.0 lb

## 2020-10-03 DIAGNOSIS — I5020 Unspecified systolic (congestive) heart failure: Secondary | ICD-10-CM | POA: Diagnosis not present

## 2020-10-03 DIAGNOSIS — I25118 Atherosclerotic heart disease of native coronary artery with other forms of angina pectoris: Secondary | ICD-10-CM | POA: Diagnosis not present

## 2020-10-03 DIAGNOSIS — I739 Peripheral vascular disease, unspecified: Secondary | ICD-10-CM

## 2020-10-03 DIAGNOSIS — R609 Edema, unspecified: Secondary | ICD-10-CM | POA: Insufficient documentation

## 2020-10-03 MED ORDER — FUROSEMIDE 40 MG PO TABS: 40.0000 mg | ORAL_TABLET | ORAL | 0 refills | Status: DC | PRN

## 2020-10-03 MED ORDER — DAPAGLIFLOZIN PROPANEDIOL 10 MG PO TABS: 10.0000 mg | ORAL_TABLET | Freq: Every day | ORAL | 3 refills | Status: DC

## 2020-10-03 NOTE — Progress Notes (Signed)
Patient referred by Maximiano Coss, NP for CAD  Subjective:   Jonathon Cain, male    DOB: 19-Nov-1960, 60 y.o.   MRN: [id number]   Chief Complaint  Patient presents with   Coronary Artery Disease   HFrEF (heart failure with reduced ejection fraction   Follow-up   Results    Echo and stress test     HPI  60 y.o. 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  Patient continues to have stable, unchanged, constant retrosternal pain that is no worse with exertion.  He is able to walk without any significant difficulty.  Current Outpatient Medications on File Prior to Visit  Medication Sig Dispense Refill   atorvastatin (LIPITOR) 80 MG tablet TAKE 1 TABLET(80 MG) BY MOUTH DAILY 90 tablet 0   cetirizine (ZYRTEC) 10 MG tablet Take 10 mg by mouth daily as needed for allergies.     clopidogrel (PLAVIX) 75 MG tablet Take 1 tablet (75 mg total) by mouth daily. 90 tablet 3   ENTRESTO 24-26 MG TAKE 1 TABLET BY MOUTH TWICE DAILY 60 tablet 1   fluticasone (FLONASE) 50 MCG/ACT nasal spray Place 2 sprays into both nostrils daily as needed for allergies or rhinitis.      furosemide (LASIX) 40 MG tablet Take 1 tablet (40 mg total) by mouth daily. 90 tablet 1   metoprolol succinate (TOPROL-XL) 25 MG 24 hr tablet Take 0.5 tablets (12.5 mg total) by mouth every other day. 30 tablet 2   nitroGLYCERIN (NITROSTAT) 0.4 MG SL tablet Place 1 tablet (0.4 mg total) under the tongue every 5 (five) minutes as needed for chest pain. 25 tablet 2   rivaroxaban (XARELTO) 2.5 MG TABS tablet Take 1 tablet (2.5 mg total) by mouth 2 (two) times daily. 60 tablet 2    spironolactone (ALDACTONE) 25 MG tablet TAKE 1 TABLET(25 MG) BY MOUTH DAILY 90 tablet 1   tamsulosin (FLOMAX) 0.4 MG CAPS capsule Take 1 capsule (0.4 mg total) by mouth daily as needed (urine flow). 90 capsule 1   No current facility-administered medications on file prior to visit.    Cardiovascular and other pertinent studies:  Lexiscan Tetrofosmin stress test 09/18/2020: Lexiscan nuclear stress test performed using 1-day protocol. Patient experienced dyspnea and chest discomfort SPECT images showed large sized, moderate intensity, fixed perfusion defect in mid to apical, anterior, inferior, and apical myocardium. There is severe decrease in myocardial thickening and wall motion. Stress LVEF 12%. High risk study.  Echocardiogram 09/18/2020:  Left ventricle cavity is moderately dilated. Normal left ventricular wall  thickness. Moderate global hypokinesis with apical dyskinesis.  LV  encocardial poorly visualized. LVEF probably around 35%.  Doppler evidence  of grade III (restricted) diastolic dysfunction, normal LAP.  Left atrial cavity is mildly dilated.  Moderate (Grade II) mitral regurgitation.  Mild tricuspid regurgitation.  No evidence of pulmonary hypertension.  No significant change compared to previous study on 10/24/2019.  PV intervention 05/21/2020: Left SFA CTO Successful retrograde revascularization with 5.0X150 mm Ranger DCB  EKG 05/28/2020: Sinus rhythm 51 bpm First degree A-V block  Left anterior fascicular block Poor R-wave progression Nonspecific ST changes  05/21/2020: Left SFA CTO Successful retrograde revascularization with 5.0X150 mm Ranger DCB  ABI 04/17/2020:  This exam reveals normal perfusion of the right lower extremity (ABI  1.00). There is mildly abnormal biphasic waveform at the right ankle.  This exam reveals moderately decreased perfusion of the left lower  extremity, noted at the post tibial artery level (ABI 0.75). Severely  abnormal monophasic  waveform at the left ankle.  Compared to 03/07/2020, right ABI 0.75 and left ABI 0.66.  Peripheral intervention 03/19/2020: Rt CIA 80% stenosis Lt CIA 95% stenosis Rt SFA short segment of CTO 3 vessel below the knee runoff Lt SFA short segment of CTO Occluded Lt ATA with 2 vessel below the knee runoff   PTA Rt SFA 5.0X80 mm In.Pact drug coated balloon PTA and stenting Rt CIA 9.0X40 mm Eluvia drug eluting stent PTA and stenting Rt CIA 10.0X40 mm Eluvia drug eluting stent   Will stage Lt SFA revascularization, if clinically indicated  EKG 02/02/2020: Junctional rhythm/einus bradycardia 42 bpm Left anterolateral infarct Left anterior fascicular block No ischemic  Cardiac MRI 02/01/2020: 1. Subendocardial late gadolinium enhancement consistent with prior infarct in LAD territory. There is >50% transmural LGE suggesting nonviability in mid anterior/anteroseptal walls, apical anterior/septal/inferior walls, and apex. 2. Mild LV dilatation with moderate systolic function (EF 86%). Akinesis of mid to apical anterior, mid anteroseptal, apical septal, apical inferior, and apex. 3.  Normal RV size and systolic function (EF 75%) 4.  Moderate mitral regurgitation (regurgitant fraction 24%) 5.  Moderate right pleural effusion   Coronary angiography 11/14/2019: LM: Diffuse 30% disease. IVUS MLA 7.3 mm2,. dFR 1.0 LAD: Patent prox LAD stent with udner expansion        Optimization with 3.5X15 mm [place] balloon at 22 atm        Excellent expansion as seen on IVUS        TIMI flow II-->III        Diag: Mod diffuse disease LCx: 80% ostial OM1 stenosis. dFR 0.98 RCA: Not engaged today  Echocardiogram 10/24/2019:  Left ventricle cavity is mildly dilated. Distal anteroseptal and apical  akinesis. LV endocardial border not well visualized to accurately asses  EF, probably around 30%. Recommend alternate imaging modality to  accurately assess EF.   Doppler evidence of grade III (restrictive)   diastolic dysfunction.  Left atrial cavity is severely dilated.  Moderate (Grade III) mitral regurgitation.  Inadequate TR jet to estimate PASP. Estimated RA pressure 8 mmHg.  No significant change compared to previous study on 07/27/2019.   Left Heart Catheterization 07/26/19 (Dr. Einar Gip):  LM: Mild diffuse disease LAD: Very large caliber vessel, occluded at the proximal end.  Gives origin to high D1 which is large.  Successful PTCA and stenting of the proximal LAD overlapping D1, 3.5 x 24 mm Synergy DES deployed at 12 atmospheric pressure for 60 S. 100% to 0% with TIMI 0 to TIMI-3 flow. LCx: Large vessel, giving origin to a very large OM1 which is secondary branches.  Ostium of OM1 has 80% stenosis. RCA: Mild diffuse disease, distal RCA 15 to 20% stenosis, PDA 50% stenosis.   Recommendation: Patient will need aggressive risk factor modification, dual antiplatelet therapy with aspirin and Brilinta for a year, will be watched carefully in the intensive care unit for preshock, suspect his EDP is extremely high at the end of the procedure with excessive contrast use.  Will monitor for any contrast nephropathy as well.  Recent labs: 04/19/2020: Chol 119, TG 75, HDL 40, LDL 64  02/19/2020: Glucose 90, BUN/Cr 21/1.04. EGFR 77. Na/K 136/4.0.  AST/ALT 43/55,  AlKP 216 H/H 16/50. MCV 87. Platelets 258   11/02/2019: Glucose 85, BUN/Cr 15/1.18. EGFR 66. Na/K 138/4.7.  H/H 14/46. MCV 91. Platelets 222  08/17/2019: Glucose 90, BUN/Cr 17/1.21. EGFR 65. Na/K 136/4.9.  H/H 14/44. MCV 90. Platelets 289  07/26/2019: Glucose 122, BUN/Cr 9/0.9. EGFR 60. H/H 16.1/49.0. MCV 90.2. Platelets 240 HbA1C 5.5% Chol 230, TG 95, HDL 36, LDL 175   Review of Systems  Cardiovascular:  Positive for chest pain and dyspnea on exertion (Stable class II). Negative for leg swelling, palpitations and syncope.  Gastrointestinal:  Positive for diarrhea.        Vitals:   10/03/20 1145  BP: 123/75  Pulse: 73  Resp:  16  Temp: 98.1 F (36.7 C)  SpO2: 97%     Body mass index is 39.17 kg/m. Filed Weights   10/03/20 1145  Weight: 273 lb (123.8 kg)      Objective:   Physical Exam Vitals and nursing note reviewed.  Constitutional:      General: He is not in acute distress. Neck:     Vascular: No JVD.  Cardiovascular:     Rate and Rhythm: Normal rate and regular rhythm.     Heart sounds: Normal heart sounds. No murmur heard. Pulmonary:     Effort: Pulmonary effort is normal.     Breath sounds: Normal breath sounds. No wheezing or rales.  Musculoskeletal:     Right lower leg: No edema.     Left lower leg: No edema.         Assessment & Recommendations:   60 y.o. 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 Residual mild below the knee disease Continue Plavix 75 mg daily, Xarelto 2.5 mg bid  CAD: Constant chest pain.  Large area of LAD infarct, but no ischemia on stress test 09/2020.  S/p primary PCI to culprit prox LAD (07/2019) dFR negative 80% stenosis in OM1 (16109) LAD stent optimized under IVUS (10/2019) Continue Plavix 75 mg daily, Xarelto 2.5 mg bid Continue Lipitor 80. LDL down to 64.  Ischemic cardiomyopathy: Stable NYHA class II EF around 35%, MRI noted EF of 36% in 2021. Continue Entesto 24-26 mg bid, spironolactone 25 mg daily, metoprolol succinate-prefers to take 12.5 mg every other day.   Added Farxiga 10 mg daily.   Continue Lasix only as needed.  Labs and follow-up in 4 to 6 weeks  Niv Darley Esther Hardy, MD [facility] [place] Pager: [phone] Office: [phone]

## 2020-10-17 ENCOUNTER — Ambulatory Visit (INDEPENDENT_AMBULATORY_CARE_PROVIDER_SITE_OTHER): Payer: Medicare HMO | Admitting: Dermatology

## 2020-10-17 ENCOUNTER — Other Ambulatory Visit: Payer: Self-pay

## 2020-10-17 ENCOUNTER — Encounter: Payer: Self-pay | Admitting: Dermatology

## 2020-10-17 DIAGNOSIS — C4359 Malignant melanoma of other part of trunk: Secondary | ICD-10-CM

## 2020-10-17 DIAGNOSIS — C439 Malignant melanoma of skin, unspecified: Secondary | ICD-10-CM

## 2020-10-17 DIAGNOSIS — L988 Other specified disorders of the skin and subcutaneous tissue: Secondary | ICD-10-CM | POA: Diagnosis not present

## 2020-10-17 NOTE — Patient Instructions (Signed)

## 2020-10-18 ENCOUNTER — Other Ambulatory Visit: Payer: Self-pay | Admitting: Cardiology

## 2020-10-18 DIAGNOSIS — I25118 Atherosclerotic heart disease of native coronary artery with other forms of angina pectoris: Secondary | ICD-10-CM

## 2020-10-24 ENCOUNTER — Telehealth: Payer: Self-pay | Admitting: Registered Nurse

## 2020-10-24 NOTE — Chronic Care Management (AMB) (Signed)
  Chronic Care Management   Outreach Note  10/24/2020 Name: Bun Rinne MRN: [id number] DOB: 12-Feb-1961  Referred by: Maximiano Coss, NP Reason for referral : No chief complaint on file.   An unsuccessful telephone outreach was attempted today. The patient was referred to the pharmacist for assistance with care management and care coordination.   Follow Up Plan:   Tatjana Dellinger Upstream Scheduler

## 2020-10-25 ENCOUNTER — Telehealth: Payer: Self-pay | Admitting: Registered Nurse

## 2020-10-25 NOTE — Progress Notes (Signed)
  Chronic Care Management   Note  10/25/2020 Name: Jonathon Cain MRN: [id number] DOB: 11-Jul-1960  Jonathon Cain is a 60 y.o. year old male who is a primary care patient of Maximiano Coss, NP. I reached out to [name] by phone today in response to a referral sent by Jonathon Cain's PCP, Maximiano Coss, NP.   Jonathon Cain was given information about Chronic Care Management services today including:  CCM service includes personalized support from designated clinical staff supervised by his physician, including individualized plan of care and coordination with other care providers 24/7 contact phone numbers for assistance for urgent and routine care needs. Service will only be billed when office clinical staff spend 20 minutes or more in a month to coordinate care. Only one practitioner may furnish and bill the service in a calendar month. The patient may stop CCM services at any time (effective at the end of the month) by phone call to the office staff.   Patient agreed to services and verbal consent obtained.   Follow up plan:   Jonathon Cain Secretary/administrator

## 2020-10-29 ENCOUNTER — Other Ambulatory Visit: Payer: Self-pay

## 2020-10-29 ENCOUNTER — Ambulatory Visit (INDEPENDENT_AMBULATORY_CARE_PROVIDER_SITE_OTHER): Payer: Medicare HMO

## 2020-10-29 DIAGNOSIS — Z4802 Encounter for removal of sutures: Secondary | ICD-10-CM

## 2020-10-29 DIAGNOSIS — I5020 Unspecified systolic (congestive) heart failure: Secondary | ICD-10-CM | POA: Diagnosis not present

## 2020-10-29 NOTE — Progress Notes (Signed)
NTS Suture removal, No s/s of infection, path to pt. 

## 2020-10-30 LAB — BASIC METABOLIC PANEL
BUN/Creatinine Ratio: 11 (ref 10–24)
BUN: 12 mg/dL (ref 8–27)
CO2: 21 mmol/L (ref 20–29)
Calcium: 8.7 mg/dL (ref 8.6–10.2)
Chloride: 105 mmol/L (ref 96–106)
Creatinine, Ser: 1.08 mg/dL (ref 0.76–1.27)
Glucose: 86 mg/dL (ref 65–99)
Potassium: 4.2 mmol/L (ref 3.5–5.2)
Sodium: 141 mmol/L (ref 134–144)
eGFR: 78 mL/min/{1.73_m2} (ref >59)

## 2020-10-30 LAB — PRO B NATRIURETIC PEPTIDE: NT-Pro BNP: 733 pg/mL — ABNORMAL HIGH (ref 0–210)

## 2020-11-01 ENCOUNTER — Ambulatory Visit: Payer: Medicare HMO | Admitting: Cardiology

## 2020-11-04 ENCOUNTER — Other Ambulatory Visit: Payer: Self-pay

## 2020-11-04 ENCOUNTER — Ambulatory Visit: Payer: Medicare HMO | Admitting: Cardiology

## 2020-11-04 ENCOUNTER — Encounter: Payer: Self-pay | Admitting: Dermatology

## 2020-11-04 ENCOUNTER — Encounter: Payer: Self-pay | Admitting: Cardiology

## 2020-11-04 VITALS — BP 114/59 | HR 55 | Temp 98.6°F | Resp 16 | Ht 70.0 in | Wt 277.0 lb

## 2020-11-04 DIAGNOSIS — I739 Peripheral vascular disease, unspecified: Secondary | ICD-10-CM

## 2020-11-04 DIAGNOSIS — I25118 Atherosclerotic heart disease of native coronary artery with other forms of angina pectoris: Secondary | ICD-10-CM

## 2020-11-04 DIAGNOSIS — I5020 Unspecified systolic (congestive) heart failure: Secondary | ICD-10-CM

## 2020-11-04 MED ORDER — SACUBITRIL-VALSARTAN 49-51 MG PO TABS: 1.0000 | ORAL_TABLET | Freq: Two times a day (BID) | ORAL | Status: DC

## 2020-11-04 NOTE — Progress Notes (Signed)
   Follow-Up Visit   Subjective  Jonathon Cain is a 60 y.o. male who presents for the following: Procedure (Patient here today for mm surgery).  Melanoma left upper back, here for wider excision Location:  Duration:  Quality:  Associated Signs/Symptoms: Modifying Factors:  Severity:  Timing: Context:   Objective  Well appearing patient in no apparent distress; mood and affect are within normal limits. Left Upper Back Lesion identified by Dr.Marliyah Reid and nurse in room.      All skin waist up examined.   Assessment & Plan    Melanoma of skin ([place]) Left Upper Back  Skin excision  Lesion length (cm):  0.7 Lesion width (cm):  0.7 Margin per side (cm):  1 Total excision diameter (cm):  2.7 Informed consent: discussed and consent obtained   Timeout: patient name, date of birth, surgical site, and procedure verified   Anesthesia: the lesion was anesthetized in a standard fashion   Anesthetic:  1% lidocaine w/ epinephrine 1-100,000 local infiltration Instrument used: #15 blade   Hemostasis achieved with: pressure and electrodesiccation   Outcome: patient tolerated procedure well with no complications   Post-procedure details: sterile dressing applied and wound care instructions given   Dressing type: bandage, petrolatum and pressure dressing   Additional details:  3-0 Vicryl x 7  Ethilon x 6 top suture and x5 mattress   Skin repair Complexity:  Intermediate Final length (cm):  6.5 Informed consent: discussed and consent obtained   Timeout: patient name, date of birth, surgical site, and procedure verified   Reason for type of repair: reduce tension to allow closure, reduce the risk of dehiscence, infection, and necrosis and reduce subcutaneous dead space and avoid a hematoma   Subcutaneous layers (deep stitches):  Suture size:  3-0 Suture type: Vicryl (polyglactin 910)   Fine/surface layer approximation (top stitches):  Suture size:  3-0 Suture type: nylon    Suture removal (days):  14 Hemostasis achieved with: suture and electrodesiccation Outcome: patient tolerated procedure well with no complications   Post-procedure details: sterile dressing applied   Dressing type: petrolatum    Specimen 1 - Surgical pathology Differential Diagnosis: mm with lateral margin stained  Check Margins: yes   1 cm margins marked preoperatively.  Excision taken to deep subcutaneous layer, layered closure.      I, Lavonna Monarch, MD, have reviewed all documentation for this visit.  The documentation on 11/04/20 for the exam, diagnosis, procedures, and orders are all accurate and complete.

## 2020-11-04 NOTE — Progress Notes (Signed)
Patient referred by Maximiano Coss, NP for CAD  Subjective:   Jonathon Cain, male    DOB: [birth year], 60 y.o.   MRN: [id number]   Chief Complaint  Patient presents with   Coronary artery disease of native artery of native heart wi   Peripheral arterial disease    Follow-up   Results    labs     HPI  60 y.o. 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 Patient has stable NYHA class II symptoms.  Current Outpatient Medications on File Prior to Visit  Medication Sig Dispense Refill   atorvastatin (LIPITOR) 80 MG tablet TAKE 1 TABLET(80 MG) BY MOUTH DAILY 90 tablet 0   cetirizine (ZYRTEC) 10 MG tablet Take 10 mg by mouth daily as needed for allergies.     clopidogrel (PLAVIX) 75 MG tablet Take 1 tablet (75 mg total) by mouth daily. 90 tablet 3   dapagliflozin propanediol (FARXIGA) 10 MG TABS tablet Take 1 tablet (10 mg total) by mouth daily before breakfast. 30 tablet 3   ENTRESTO 24-26 MG TAKE 1 TABLET BY MOUTH TWICE DAILY 60 tablet 1   fluticasone (FLONASE) 50 MCG/ACT nasal spray Place 2 sprays into both nostrils daily as needed for allergies or rhinitis.      furosemide (LASIX) 40 MG tablet Take 1 tablet (40 mg total) by mouth as needed. Take only if you have more than usual leg swelling 1 tablet 0   metoprolol succinate (TOPROL-XL) 25 MG 24 hr tablet Take 0.5 tablets (12.5 mg total) by mouth every other day. 30 tablet 2   nitroGLYCERIN (NITROSTAT) 0.4 MG SL tablet Place 1 tablet (0.4 mg total) under the tongue every 5 (five) minutes as needed for chest pain. 25 tablet 2   rivaroxaban (XARELTO) 2.5 MG TABS tablet Take 1 tablet (2.5 mg total) by mouth 2 (two)  times daily. 60 tablet 2   spironolactone (ALDACTONE) 25 MG tablet TAKE 1 TABLET(25 MG) BY MOUTH DAILY 90 tablet 1   tamsulosin (FLOMAX) 0.4 MG CAPS capsule Take 1 capsule (0.4 mg total) by mouth daily as needed (urine flow). 90 capsule 1   No current facility-administered medications on file prior to visit.    Cardiovascular and other pertinent studies:  Lexiscan Tetrofosmin stress test 09/18/2020: Lexiscan nuclear stress test performed using 1-day protocol. Patient experienced dyspnea and chest discomfort SPECT images showed large sized, moderate intensity, fixed perfusion defect in mid to apical, anterior, inferior, and apical myocardium. There is severe decrease in myocardial thickening and wall motion. Stress LVEF 12%. High risk study.  Echocardiogram 09/18/2020:  Left ventricle cavity is moderately dilated. Normal left ventricular wall  thickness. Moderate global hypokinesis with apical dyskinesis.  LV  encocardial poorly visualized. LVEF probably around 35%.  Doppler evidence  of grade III (restricted) diastolic dysfunction, normal LAP.  Left atrial cavity is mildly dilated.  Moderate (Grade II) mitral regurgitation.  Mild tricuspid regurgitation.  No evidence of pulmonary hypertension.  No significant change compared to previous study on 10/24/2019.  PV intervention 05/21/2020: Left SFA CTO Successful retrograde revascularization with 5.0X150 mm [place] DCB  EKG 05/28/2020: Sinus rhythm 51 bpm First degree A-V block  Left anterior fascicular block Poor R-wave progression Nonspecific ST changes  05/21/2020: Left SFA CTO Successful retrograde revascularization with 5.0X150 mm Ranger DCB   ABI 04/17/2020:  This exam reveals normal perfusion of the right lower extremity (ABI  1.00). There is mildly abnormal biphasic waveform at the right ankle.  This exam reveals moderately decreased perfusion of the left lower  extremity, noted at the post tibial artery level (ABI 0.75).  Severely  abnormal monophasic waveform at the left ankle.  Compared to 03/07/2020, right ABI 0.75 and left ABI 0.66.  Peripheral intervention 03/19/2020: Rt CIA 80% stenosis Lt CIA 95% stenosis Rt SFA short segment of CTO 3 vessel below the knee runoff Lt SFA short segment of CTO Occluded Lt ATA with 2 vessel below the knee runoff   PTA Rt SFA 5.0X80 mm In.Pact drug coated balloon PTA and stenting Rt CIA 9.0X40 mm Eluvia drug eluting stent PTA and stenting Rt CIA 10.0X40 mm Eluvia drug eluting stent   Will stage Lt SFA revascularization, if clinically indicated  EKG 02/02/2020: Junctional rhythm/einus bradycardia 42 bpm Left anterolateral infarct Left anterior fascicular block No ischemic  Cardiac MRI 02/01/2020: 1. Subendocardial late gadolinium enhancement consistent with prior infarct in LAD territory. There is >50% transmural LGE suggesting nonviability in mid anterior/anteroseptal walls, apical anterior/septal/inferior walls, and apex. 2. Mild LV dilatation with moderate systolic function (EF 03%). Akinesis of mid to apical anterior, mid anteroseptal, apical septal, apical inferior, and apex. 3.  Normal RV size and systolic function (EF 15%) 4.  Moderate mitral regurgitation (regurgitant fraction 24%) 5.  Moderate right pleural effusion   Coronary angiography 11/14/2019: LM: Diffuse 30% disease. IVUS MLA 7.3 mm2,. dFR 1.0 LAD: Patent prox LAD stent with udner expansion        Optimization with 3.5X15 mm [place] balloon at 22 atm        Excellent expansion as seen on IVUS        TIMI flow II-->III        Diag: Mod diffuse disease LCx: 80% ostial OM1 stenosis. dFR 0.98 RCA: Not engaged today  Echocardiogram 10/24/2019:  Left ventricle cavity is mildly dilated. Distal anteroseptal and apical  akinesis. LV endocardial border not well visualized to accurately asses  EF, probably around 30%. Recommend alternate imaging modality to  accurately assess EF.   Doppler evidence of  grade III (restrictive)  diastolic dysfunction.  Left atrial cavity is severely dilated.  Moderate (Grade III) mitral regurgitation.  Inadequate TR jet to estimate PASP. Estimated RA pressure 8 mmHg.  No significant change compared to previous study on 07/27/2019.   Left Heart Catheterization 07/26/19 (Dr. Einar Gip):  LM: Mild diffuse disease LAD: Very large caliber vessel, occluded at the proximal end.  Gives origin to high D1 which is large.  Successful PTCA and stenting of the proximal LAD overlapping D1, 3.5 x 24 mm Synergy DES deployed at 12 atmospheric pressure for 60 S. 100% to 0% with TIMI 0 to TIMI-3 flow. LCx: Large vessel, giving origin to a very large OM1 which is secondary branches.  Ostium of OM1 has 80% stenosis. RCA: Mild diffuse disease, distal RCA 15 to 20% stenosis, PDA 50% stenosis.   Recommendation: Patient will need aggressive risk factor modification, dual antiplatelet therapy with aspirin and Brilinta for a year, will be watched carefully in the intensive care unit for preshock, suspect his EDP is extremely high at the end of the procedure with excessive contrast use.  Will monitor for any contrast nephropathy as well.  Recent labs: 10/29/2020: Glucose 86, BUN/Cr 12/1.08. EGFR 78. Na/K  141/4.2.  NT pro BNP 733    04/19/2020: Chol 119, TG 75, HDL 40, LDL 64  02/19/2020: Glucose 90, BUN/Cr 21/1.04. EGFR 77. Na/K 136/4.0.  AST/ALT 43/55, AlKP 216 H/H 16/50. MCV 87. Platelets 258   11/02/2019: Glucose 85, BUN/Cr 15/1.18. EGFR 66. Na/K 138/4.7.  H/H 14/46. MCV 91. Platelets 222  08/17/2019: Glucose 90, BUN/Cr 17/1.21. EGFR 65. Na/K 136/4.9.  H/H 14/44. MCV 90. Platelets 289  07/26/2019: Glucose 122, BUN/Cr 9/0.9. EGFR 60. H/H 16.1/49.0. MCV 90.2. Platelets 240 HbA1C 5.5% Chol 230, TG 95, HDL 36, LDL 175   Review of Systems  Cardiovascular:  Positive for chest pain and dyspnea on exertion (Stable class II). Negative for leg swelling, palpitations and syncope.   Gastrointestinal:  Positive for diarrhea.        Vitals:   11/04/20 1234  BP: (!) 114/59  Pulse: (!) 55  Resp: 16  Temp: 98.6 F (37 C)  SpO2: 96%     Body mass index is 39.75 kg/m. Filed Weights   11/04/20 1234  Weight: 277 lb (125.6 kg)      Objective:   Physical Exam Vitals and nursing note reviewed.  Constitutional:      General: He is not in acute distress. Neck:     Vascular: No JVD.  Cardiovascular:     Rate and Rhythm: Normal rate and regular rhythm.     Heart sounds: Normal heart sounds. No murmur heard. Pulmonary:     Effort: Pulmonary effort is normal.     Breath sounds: Normal breath sounds. No wheezing or rales.  Musculoskeletal:     Right lower leg: No edema.     Left lower leg: No edema.         Assessment & Recommendations:   60 y.o. 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 Residual mild below the knee disease Continue Plavix 75 mg daily, Xarelto 2.5 mg bid  CAD: Constant chest pain.  Large area of LAD infarct, but no ischemia on stress test 09/2020.  S/p primary PCI to culprit prox LAD (07/2019) dFR negative 80% stenosis in OM1 (94707) LAD stent optimized under IVUS (10/2019) Continue Plavix 75 mg daily, Xarelto 2.5 mg bid Continue Lipitor 80. LDL down to 64.  Ischemic cardiomyopathy: Stable NYHA class II. NT pro BNP 700. EF has stable around 35% (echocardiogram 08/2020). EF around 35%, MRI noted EF of 36% in 2021. Increased Entesto to 49-51 mg bid Continue spironolactone 25 mg daily, metoprolol succinate-prefers to take 12.5 mg every other day, Farxiga 10 mg daily.   Continue Lasix only as  needed. I discussed referral for Biostim. He wants to think about it.   F/u in 2 weeks   Nigel Mormon, MD [facility] Pager: [phone] Office: [phone]

## 2020-11-06 ENCOUNTER — Telehealth: Payer: Self-pay

## 2020-11-06 NOTE — Progress Notes (Signed)
Chronic Care Management Pharmacy Assistant   Name: Jonathon Cain  MRN: [id number] DOB: January 06, 1961  Jonathon Cain is an 60 y.o. year old male who presents for his initial CCM visit with the clinical pharmacist.  Reason for Encounter: Chart Prep: Initial CPP Visit    Recent office visits:  07/02/20 Nedra Hai, MD (PCP) - Family Medicine - [place] - Referral placed for Dermatology. Tamsulosin (FLOMAX) 0.4 MG CAPS capsule prescribed. Follow up in 6 months.   06/10/20 Carolann Littler, MD - Family Medicine - Abscess of right groin - No medication changes. Follow up not indicated.   06/07/20  Carolann Littler, MD - Family Medicine - Abscess of right groin - Recommended patient to keep wound dry over the weekend. Follow up Monday for packing removal.   06/05/20 Vernell Leep, MD - Cardiology - Heart Failure - Entesto 24-26 mg bid, spironolactone 25 mg daily. Lasix 40/20 mg alternate. Take metoprolol succinate 12.5 mg every other day, due to bradycardia. Follow up in 3 months.   06/04/20 Adrian Prows, MD - Cardiology - Peripheral Vascular Disease - No notes available.   05/30/20 Lanae Crumbly, DPM - Podiatry - Ingrown toenail of left foot - Neomycin-polymyxin-hydrocortisone (CORTISPORIN) 3.5-10000-1 OTIC suspension Apply 1-2 drops daily after soaking and cover with band aid prescribed. Ingrown toenail removal performed. Follow up in 2 weeks.   05/28/20 Vernell Leep, MD - Cardiology - Peripheral Arterial Disease - EKG performed. Lower Arterial and Brachial index ordered. Follow up 4 weeks.    Recent consult visits:  11/04/20 Blenda Mounts, MD - Cardiology - Heart Failure - No medication changes. Follow up in 2 weeks.   10/17/20 Lavonna Monarch, MD - Dermatololgy - Melanoma of skin - Skin biopsy performed. Apply Vaseline to area twice daily until healed (Not Neosporin). Return to our office in 7-10 or 10-14 days for a nurse visit for suture removal  10/03/20 Blenda Mounts, MD -  Cardiology - Lab were ordered. Dapagliflozin propanediol (FARXIGA) 10 MG TABS tablet and Furosemide (LASIX) 40 MG tabletfurosemide (LASIX) 40 MG tablet prescribed. Follow up in 6-8 weeks.  09/10/20 Peter Congo, MD - Ophthalmology - No medications changed. Follow up in 4 months.  09/03/20 Lavonna Monarch, MD - Dermatology - Neoplasm of uncertain behavior of skin - Nail and skin biopsy ordered. Will refer to general surgeon if patient decides he wants lipoma of torso removed. Return to our office in 7-10 or 10-14 days for a nurse visit for suture removal.  09/02/20 Blenda Mounts, MD - Cardiology - ECHO ordered. Myocardial Perfusion ordered. Stop Aspirin/Brilinta and start Plavix and Xarelto 2.5 mg bid. Follow up after imaging tests are completed.   06/13/20 Lanae Crumbly, DPM - Podiatry - Ingrown toenail of left great toe - No medication changes. Follow up as needed.   Hospital visits: 05/21/20  Medication Reconciliation was completed by comparing discharge summary, patient's EMR and Pharmacy list, and upon discussion with patient.  Admitted to the hospital on 05/21/20 due to Peripheral Artery Disease. Discharge date was 05/21/20. Discharged from [facility]?Medications Started at [facility] Discharge:?? No new medications noted.  Medication Changes at Hospital Discharge: No medication changes were noted.   Medications Discontinued at Hospital Discharge: No medications were discontinued at discharge.   Medications that remain the same after Hospital Discharge:??  All other medications will remain the same.     Medications: Outpatient Encounter Medications as of 11/06/2020  Medication Sig   atorvastatin (LIPITOR) 80 MG tablet TAKE  1 TABLET(80 MG) BY MOUTH DAILY   cetirizine (ZYRTEC) 10 MG tablet Take 10 mg by mouth daily as needed for allergies.   clopidogrel (PLAVIX) 75 MG tablet Take 1 tablet (75 mg total) by mouth daily.   dapagliflozin propanediol (FARXIGA) 10 MG  TABS tablet Take 1 tablet (10 mg total) by mouth daily before breakfast.   fluticasone (FLONASE) 50 MCG/ACT nasal spray Place 2 sprays into both nostrils daily as needed for allergies or rhinitis.    furosemide (LASIX) 40 MG tablet Take 1 tablet (40 mg total) by mouth as needed. Take only if you have more than usual leg swelling   metoprolol succinate (TOPROL-XL) 25 MG 24 hr tablet Take 0.5 tablets (12.5 mg total) by mouth every other day.   rivaroxaban (XARELTO) 2.5 MG TABS tablet Take 1 tablet (2.5 mg total) by mouth 2 (two) times daily.   spironolactone (ALDACTONE) 25 MG tablet TAKE 1 TABLET(25 MG) BY MOUTH DAILY   tamsulosin (FLOMAX) 0.4 MG CAPS capsule Take 1 capsule (0.4 mg total) by mouth daily as needed (urine flow).   Facility-Administered Encounter Medications as of 11/06/2020  Medication   sacubitril-valsartan (ENTRESTO) 49-51 mg per tablet     Have you seen any other providers since your last visit?   Any changes in your medications or health?   Any side effects from any medications?   Do you have an symptoms or problems not managed by your medications?   Any concerns about your health right now?   Has your provider asked that you check blood pressure, blood sugar, or follow special diet at home?   Do you get any type of exercise on a regular basis?   Can you think of a goal you would like to reach for your health?   Do you have any problems getting your medications?   Is there anything that you would like to discuss during the appointment?    Please bring medications and supplements to appointment. Patient notified of appt date and time of 11/13/20 @ 11:00am.    Star Rating Drugs: Atorvastatin (LIPITOR) 80 MG tablet - last filled 09/02/20 90 days   Future Appointments  Date Time Provider [place]  11/13/2020 11:00 AM [facility] C[facility] PHARMACIST [facility]  11/20/2020  3:15 PM Patwardhan, Reynold Bowen, MD [facility] None  12/03/2020  3:10 PM Maximiano Coss, NP  [facility]  01/16/2021 11:00 AM Lavonna Monarch, MD [facility]    Multiple unsuccessful attempts were made to reach patient. Phone goes straught to a busy tone and no VM. [place], [place] Clinical Pharmacist Assistant  [phone]  Time Spent:  50 minutes

## 2020-11-13 ENCOUNTER — Telehealth: Payer: Medicare HMO

## 2020-11-13 ENCOUNTER — Other Ambulatory Visit: Payer: Self-pay | Admitting: Registered Nurse

## 2020-11-13 ENCOUNTER — Ambulatory Visit (INDEPENDENT_AMBULATORY_CARE_PROVIDER_SITE_OTHER): Payer: Medicare HMO

## 2020-11-13 DIAGNOSIS — R195 Other fecal abnormalities: Secondary | ICD-10-CM | POA: Diagnosis not present

## 2020-11-13 DIAGNOSIS — E782 Mixed hyperlipidemia: Secondary | ICD-10-CM

## 2020-11-13 DIAGNOSIS — I5020 Unspecified systolic (congestive) heart failure: Secondary | ICD-10-CM

## 2020-11-13 DIAGNOSIS — I739 Peripheral vascular disease, unspecified: Secondary | ICD-10-CM | POA: Diagnosis not present

## 2020-11-13 NOTE — Progress Notes (Signed)
Chronic Care Management Pharmacy Note  11/13/2020 Name:  Jonathon Cain MRN:  [id number] DOB:  1960/12/16  Subjective: Jonathon Cain is an 60 y.o. year old male who is a primary patient of Maximiano Coss, NP.  The CCM team was consulted for assistance with disease management and care coordination needs.    Engaged with patient by telephone for initial visit in response to provider referral for pharmacy case management and/or care coordination services.   Consent to Services:  The patient was given the following information about Chronic Care Management services today, agreed to services, and gave verbal consent: 1. CCM service includes personalized support from designated clinical staff supervised by the primary care provider, including individualized plan of care and coordination with other care providers 2. 24/7 contact phone numbers for assistance for urgent and routine care needs. 3. Service will only be billed when office clinical staff spend 20 minutes or more in a month to coordinate care. 4. Only one practitioner may furnish and bill the service in a calendar month. 5.The patient may stop CCM services at any time (effective at the end of the month) by phone call to the office staff. 6. The patient will be responsible for cost sharing (co-pay) of up to 20% of the service fee (after annual deductible is met). Patient agreed to services and consent obtained.  Patient Care Team: Maximiano Coss, NP as PCP - General (Adult Health Nurse Practitioner) Nigel Mormon, MD as Consulting Physician (Cardiology) Peter Congo, MD as Referring Physician (Ophthalmology) Lavonna Monarch, MD as Consulting Physician (Dermatology) Madelin Rear, [facility] as Pharmacist (Pharmacist)  Hospital visits: None in previous 6 months  Objective:  Lab Results  Component Value Date   CREATININE 1.08 10/29/2020   CREATININE 0.95 05/17/2020   CREATININE 1.19 03/13/2020    Lab Results   Component Value Date   HGBA1C 5.5 07/26/2019   Last diabetic Eye exam: No results found for: HMDIABEYEEXA  Last diabetic Foot exam: No results found for: HMDIABFOOTEX      Component Value Date/Time   CHOL 119 04/19/2020 0935   CHOL 230 (H) 07/26/2019 1854   TRIG 75 04/19/2020 0935   TRIG 95 07/26/2019 1854   HDL 40 04/19/2020 0935   HDL 36 (L) 07/26/2019 1854   CHOLHDL 3.0 04/19/2020 0935   CHOLHDL 6.4 07/26/2019 1854   VLDL 19 07/26/2019 1854   LDLCALC 64 04/19/2020 0935   LDLCALC 175 (H) 07/26/2019 1854    Hepatic Function Latest Ref Rng & Units 02/19/2020 07/26/2019 12/03/2016  Total Protein 6.0 - 8.3 g/dL 7.4 6.9 7.1  Albumin 3.5 - 5.2 g/dL 3.9 3.0(L) 2.9(L)  AST 0 - 37 U/L 43(H) 52(H) 24  ALT 0 - 53 U/L 55(H) 53(H) 32  Alk Phosphatase 39 - 117 U/L 216(H) 150(H) 149  Total Bilirubin 0.2 - 1.2 mg/dL 1.1 0.8 0.44    No results found for: TSH, FREET4  CBC Latest Ref Rng & Units 05/17/2020 03/13/2020 02/19/2020  WBC 3.4 - 10.8 x10E3/uL 16.4(H) 15.5(H) 16.3(H)  Hemoglobin 13.0 - 17.7 g/dL 15.7 16.9 16.8  Hematocrit 37.5 - 51.0 % 46.8 49.7 50.2  Platelets 150 - 450 x10E3/uL 241 264 258.0    No results found for: VD25OH  Clinical ASCVD:  The ASCVD Risk score Mikey Bussing DC Jr., et al., 2013) failed to calculate for the following reasons:   The patient has a prior MI or stroke diagnosis   Social History   Tobacco Use  Smoking Status Former  Packs/day: 1.00   Years: 10.00   Pack years: 10.00   Types: Cigarettes   Quit date: 07/26/2019   Years since quitting: 1.3  Smokeless Tobacco Never   BP Readings from Last 3 Encounters:  11/04/20 (!) 114/59  10/03/20 123/75  09/02/20 106/68   Pulse Readings from Last 3 Encounters:  11/04/20 (!) 55  10/03/20 73  09/02/20 71   Wt Readings from Last 3 Encounters:  11/04/20 277 lb (125.6 kg)  10/03/20 273 lb (123.8 kg)  09/02/20 275 lb (124.7 kg)   BMI Readings from Last 3 Encounters:  11/04/20 39.75 kg/m  10/03/20 39.17  kg/m  09/02/20 39.46 kg/m    Assessment: Review of patient past medical history, allergies, medications, health status, including review of consultants reports, laboratory and other test data, was performed as part of comprehensive evaluation and provision of chronic care management services.   SDOH:  (Social Determinants of Health) assessments and interventions performed: Yes   CCM Care Plan  No Known Allergies  Medications Reviewed Today     Reviewed by Madelin Rear, [facility] (Pharmacist) on 11/13/20 at 1206  Med List Status: <None>   Medication Order Taking? Sig Documenting Provider Last Dose Status Informant  atorvastatin (LIPITOR) 80 MG tablet [id number] Yes TAKE 1 TABLET(80 MG) BY MOUTH DAILY Patwardhan, Manish J, MD Taking Active   cetirizine (ZYRTEC) 10 MG tablet [id number] Yes Take 10 mg by mouth daily as needed for allergies. [provider] Taking Active Self  clopidogrel (PLAVIX) 75 MG tablet [id number] Yes Take 1 tablet (75 mg total) by mouth daily. Patwardhan, Reynold Bowen, MD Taking Active   dapagliflozin propanediol (FARXIGA) 10 MG TABS tablet [id number] Yes Take 1 tablet (10 mg total) by mouth daily before breakfast. Patwardhan, Reynold Bowen, MD Taking Active   fluticasone (FLONASE) 50 MCG/ACT nasal spray [id number] Yes Place 2 sprays into both nostrils daily as needed for allergies or rhinitis.  [provider] Taking Active Self  furosemide (LASIX) 40 MG tablet [id number] Yes Take 1 tablet (40 mg total) by mouth as needed. Take only if you have more than usual leg swelling Patwardhan, Reynold Bowen, MD Taking Active   metoprolol succinate (TOPROL-XL) 25 MG 24 hr tablet [id number] Yes Take 0.5 tablets (12.5 mg total) by mouth every other day. Patwardhan, Reynold Bowen, MD Taking Active   rivaroxaban (XARELTO) 2.5 MG TABS tablet [id number] Yes Take 1 tablet (2.5 mg total) by mouth 2 (two) times daily. Patwardhan, Reynold Bowen, MD Taking Active   sacubitril-valsartan (ENTRESTO) 49-51 mg  per tablet [id number]   Patwardhan, Reynold Bowen, MD  Active   spironolactone (ALDACTONE) 25 MG tablet [id number] Yes TAKE 1 TABLET(25 MG) BY MOUTH DAILY Patwardhan, Manish J, MD Taking Active   tamsulosin (FLOMAX) 0.4 MG CAPS capsule [id number] Yes Take 1 capsule (0.4 mg total) by mouth daily as needed (urine flow). Maximiano Coss, NP Taking Active            Patient Active Problem List   Diagnosis Date Noted   Edema 10/03/2020   Critical lower limb ischemia ([place]) 05/20/2020   Mixed hyperlipidemia 03/29/2020   Pre-ulcerative calluses 03/13/2020   Enrolled in clinical trial of drug 02/02/2020   Peripheral arterial disease ([place]) 02/02/2020   HFrEF (heart failure with reduced ejection fraction) ([place]) 08/02/2019   Coronary artery disease of native artery of native heart with stable angina pectoris ([place]) 08/02/2019   Acute ST elevation myocardial infarction (STEMI) of anterolateral wall ([place]) 07/26/2019   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 10/20/2017   Leukocytosis 10/30/2016   Peripheral vision loss, bilateral 03/05/2016   Retinitis pigmentosa 12/11/2015   Acquired night blindness 08/08/2015   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   Epiretinal membrane (ERM) of both eyes 08/08/2015   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   Peripheral visual field defect of both eyes 08/08/2015   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   Posterior vitreous detachment of both eyes 08/08/2015   Pseudophakia of both eyes 08/08/2015    Immunization History  Administered Date(s) Administered   PFIZER(Purple Top)SARS-COV-2 Vaccination 12/14/2019, 01/04/2020   Conditions to be addressed/monitored: HFrEF, CAD, PVD, HLD, Edema  Care Plan : ccm pharmacy care plan  Updates made by Madelin Rear, [facility] since 11/13/2020 12:00 AM     Problem: CHL AMB "PATIENT-SPECIFIC PROBLEM"      Long-Range Goal: disease management   Start Date: 11/13/2020  Expected End Date:  11/13/2021  This Visit's Progress: On track  Priority: High  Note:    Pharmacist Clinical Goal(s):  Patient will verbalize ability to afford treatment regimen through collaboration with PharmD and provider.   Interventions: 1:1 collaboration with Maximiano Coss, NP regarding development and update of comprehensive plan of care as evidenced by provider attestation and co-signature Inter-disciplinary care team collaboration (see longitudinal plan of care) Comprehensive medication review performed; medication list updated in electronic medical record  Hypertension/HFrEF (BP goal <130/80) -Controlled -Current treatment: Farxiga 10 mg once daily Spironolactone 25 mg once daily Furosemide 40 mg as needed  Entresto 49-51 twice daily  Metoprolol succinate 12.5 mg twice daily -Current exercise habits: impaired vision interferes with exercise. Will walk in driveway some, feels sob is slowly improving compared to previous years, can walk longer distances.  -Denies hypotensive/hypertensive symptoms -Educated on BP goals and benefits of medications for prevention of heart attack, stroke and kidney damage; -Counseled to monitor BP at home 1-2x/wk, document, and provide log at future appointments -Recommended to continue current medication  Hyperlipidemia: (LDL goal < 55-70 ) -Controlled, improved - LL 65 on 03/2020 down from 175 5/5 -PVD, CAD. 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 Secondary prevention on plavix, xarelto, asa, BB. -Reports ongoing loose stools - black and tar like, that have persisted months. Last CBC 04/2020. Feels there has not been any acute changes. Previous PPI use (stopped 07/2020?). Discussed with PCP and CBC ordered - patient was to going [facility] today so agreeable to getting added CBC.  -Current treatment: Atorvastatin 80 mg once daily -side effect review, tolerating well -Educated on Cholesterol goals;  -Recommended to continue current medication  Heart Failure (Goal: manage  symptoms and prevent exacerbations) -Controlled -Last ejection fraction: 35% (Date: 08/2020) -HF type: HFrEF -NYHA Class: II -Current treatment: See htn -Educated on Importance of weighing daily; if you gain more than 3 pounds in one day or 5 pounds in one week, take furosemide -Recommended to continue current medication Counseled on daily weights  Patient Goals/Self-Care Activities Patient will:  - take medications as prescribed collaborate with provider on medication access solutions  Medication Assistance: will pursue PAP for Entresto and Farxiga. Verbal consent for enrollment in upstream pharmacy services obtained.   Patient's preferred pharmacy is:  [facility] - [place], [place] - [address] [place] [address] Phone: [phone] Fax: [phone]     Current Barriers:  Medication accessibility, unable to drive due to eye sight. Typically getting medications while out for appts. Some cost concerns with brand name medications  Pt endorses  100% compliance  Follow Up:  Patient agrees to Care Plan and Follow-up. Plan: HC PAP for farxiga and entresto, Med synch. CPP telephone f/u three months  Future Appointments  Date Time Provider [place]  11/20/2020  3:15 PM Nigel Mormon, MD [facility] None  12/03/2020  3:10 PM Maximiano Coss, NP [facility]  01/16/2021 11:00 AM Lavonna Monarch, MD [facility] CD[facility]   Madelin Rear, PharmD, CPP Clinical Pharmacist Practitioner  [place]  [phone]

## 2020-11-13 NOTE — Patient Instructions (Signed)
[name],  Thank you for talking with me today. I have included our care plan/goals in the following pages.   Please review and call me at [phone] with any questions.  Thanks! Ellin Mayhew, PharmD, CPP Clinical Pharmacist Practitioner  [phone]  Care Plan : ccm pharmacy care plan  Updates made by Madelin Rear, [facility] since 11/13/2020 12:00 AM     Problem: CHL AMB "PATIENT-SPECIFIC PROBLEM"      Long-Range Goal: disease management   Start Date: 11/13/2020  Expected End Date: 11/13/2021  This Visit's Progress: On track  Priority: High  Note:    Pharmacist Clinical Goal(s):  Patient will verbalize ability to afford treatment regimen through collaboration with PharmD and provider.   Interventions: 1:1 collaboration with Maximiano Coss, NP regarding development and update of comprehensive plan of care as evidenced by provider attestation and co-signature Inter-disciplinary care team collaboration (see longitudinal plan of care) Comprehensive medication review performed; medication list updated in electronic medical record  Hypertension/HFrEF (BP goal <130/80) -Controlled -Current treatment: Farxiga 10 mg once daily Spironolactone 25 mg once daily Furosemide 40 mg as needed  Entresto 49-51 twice daily  Metoprolol succinate 12.5 mg twice daily -Current exercise habits: impaired vision interferes with exercise. Will walk in driveway some, feels sob is slowly improving compared to previous years, can walk longer distances.  -Denies hypotensive/hypertensive symptoms -Educated on BP goals and benefits of medications for prevention of heart attack, stroke and kidney damage; -Counseled to monitor BP at home 1-2x/wk, document, and provide log at future appointments -Recommended to continue current medication  Hyperlipidemia: (LDL goal < 55-70 ) -Controlled, improved - LL 65 on 03/2020 down from 175 5/5 -PVD, CAD. 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 Secondary prevention on  plavix, xarelto, asa, BB. -Reports ongoing loose stools - black and tar like, that have persisted months. Last CBC 04/2020. Feels there has not been any acute changes. Previous PPI use (stopped 07/2020?). Discussed with PCP and CBC ordered - patient was to going [facility] today so agreeable to getting added CBC.  -Current treatment: Atorvastatin 80 mg once daily -side effect review, tolerating well -Educated on Cholesterol goals;  -Recommended to continue current medication  Heart Failure (Goal: manage symptoms and prevent exacerbations) -Controlled -Last ejection fraction: 35% (Date: 08/2020) -HF type: HFrEF -NYHA Class: II -Current treatment: See htn -Educated on Importance of weighing daily; if you gain more than 3 pounds in one day or 5 pounds in one week, take furosemide -Recommended to continue current medication Counseled on daily weights  Patient Goals/Self-Care Activities Patient will:  - take medications as prescribed collaborate with provider on medication access solutions  Medication Assistance: will pursue PAP for Entresto and Farxiga. Verbal consent for enrollment in upstream pharmacy services obtained.   Patient's preferred pharmacy is:  [facility] - [place], [place] - [address] [place] [address] Phone: [phone] Fax: [phone]     The patient was given the following information about Chronic Care Management services today, agreed to services, and gave verbal consent: 1. CCM service includes personalized support from designated clinical staff supervised by the primary care provider, including individualized plan of care and coordination with other care providers 2. 24/7 contact phone numbers for assistance for urgent and routine care needs. 3. Service will only be billed when office clinical staff spend 20 minutes or more in a month to coordinate care. 4. Only one practitioner may furnish and bill  the service in a calendar month. 5.The patient may stop CCM services at any time (effective at the end of the month) by phone call to the office staff. 6. The patient will be responsible for cost sharing (co-pay) of up to 20% of the service fee (after annual deductible is met). Patient agreed to services and consent obtained.  The patient verbalized understanding of instructions provided today and agreed to receive a MyChart copy of patient instruction and/or educational materials. Telephone follow up appointment with pharmacy team member scheduled for: See next appointment with "Care Management Staff" under "What's Next" below.

## 2020-11-14 ENCOUNTER — Telehealth: Payer: Self-pay

## 2020-11-14 LAB — BASIC METABOLIC PANEL
BUN/Creatinine Ratio: 14 (ref 10–24)
BUN: 13 mg/dL (ref 8–27)
CO2: 22 mmol/L (ref 20–29)
Calcium: 9.1 mg/dL (ref 8.6–10.2)
Chloride: 98 mmol/L (ref 96–106)
Creatinine, Ser: 0.95 mg/dL (ref 0.76–1.27)
Glucose: 72 mg/dL (ref 65–99)
Potassium: 4.8 mmol/L (ref 3.5–5.2)
Sodium: 138 mmol/L (ref 134–144)
eGFR: 90 mL/min/{1.73_m2} (ref >59)

## 2020-11-14 LAB — CBC WITH DIFFERENTIAL/PLATELET
Basophils Absolute: 0.1 10*3/uL (ref 0.0–0.2)
Basos: 0 %
EOS (ABSOLUTE): 0.2 10*3/uL (ref 0.0–0.4)
Eos: 2 %
Hematocrit: 49 % (ref 37.5–51.0)
Hemoglobin: 15.8 g/dL (ref 13.0–17.7)
Immature Grans (Abs): 0 10*3/uL (ref 0.0–0.1)
Immature Granulocytes: 0 %
Lymphocytes Absolute: 2.3 10*3/uL (ref 0.7–3.1)
Lymphs: 18 %
MCH: 27.6 pg (ref 26.6–33.0)
MCHC: 32.2 g/dL (ref 31.5–35.7)
MCV: 86 fL (ref 79–97)
Monocytes Absolute: 1 10*3/uL — ABNORMAL HIGH (ref 0.1–0.9)
Monocytes: 8 %
Neutrophils Absolute: 9.6 10*3/uL — ABNORMAL HIGH (ref 1.4–7.0)
Neutrophils: 72 %
Platelets: 278 10*3/uL (ref 150–450)
RBC: 5.72 x10E6/uL (ref 4.14–5.80)
RDW: 13.8 % (ref 11.6–15.4)
WBC: 13.3 10*3/uL — ABNORMAL HIGH (ref 3.4–10.8)

## 2020-11-14 NOTE — Progress Notes (Signed)
    Chronic Care Management Pharmacy Assistant   Name: Jonathon Cain  MRN: [id number] DOB: April 10, 1960   Reason for Encounter: PAP [employer]oll / [employer]  PAP applications for both [employer] and [employer] were completed and printed and mailed to patient. Instructions were provided to patient for their portion to be completed and supported documentation that is needed. Patient will forward application to Dr. Bonney Roussel  office to be completed and sent to both companies for processing.     [name], [place] Pharmacist Assistant  [phone]  Time Spent: 15 minutes

## 2020-11-17 ENCOUNTER — Other Ambulatory Visit: Payer: Self-pay | Admitting: Cardiology

## 2020-11-18 ENCOUNTER — Telehealth: Payer: Self-pay

## 2020-11-18 ENCOUNTER — Other Ambulatory Visit: Payer: Self-pay

## 2020-11-18 DIAGNOSIS — I5020 Unspecified systolic (congestive) heart failure: Secondary | ICD-10-CM

## 2020-11-18 DIAGNOSIS — I739 Peripheral vascular disease, unspecified: Secondary | ICD-10-CM

## 2020-11-18 DIAGNOSIS — I25118 Atherosclerotic heart disease of native coronary artery with other forms of angina pectoris: Secondary | ICD-10-CM

## 2020-11-18 MED ORDER — RIVAROXABAN 2.5 MG PO TABS: 2.5000 mg | ORAL_TABLET | Freq: Two times a day (BID) | ORAL | 0 refills | Status: DC

## 2020-11-18 MED ORDER — ATORVASTATIN CALCIUM 80 MG PO TABS: ORAL_TABLET | ORAL | 0 refills | Status: DC

## 2020-11-18 MED ORDER — FUROSEMIDE 40 MG PO TABS: 40.0000 mg | ORAL_TABLET | ORAL | 1 refills | Status: DC | PRN

## 2020-11-18 MED ORDER — METOPROLOL SUCCINATE ER 25 MG PO TB24: 12.5000 mg | ORAL_TABLET | ORAL | 2 refills | Status: DC

## 2020-11-18 MED ORDER — SPIRONOLACTONE 25 MG PO TABS: ORAL_TABLET | ORAL | 1 refills | Status: DC

## 2020-11-18 MED ORDER — CLOPIDOGREL BISULFATE 75 MG PO TABS: 75.0000 mg | ORAL_TABLET | Freq: Every day | ORAL | 0 refills | Status: DC

## 2020-11-18 MED ORDER — ENTRESTO 49-51 MG PO TABS: 1.0000 | ORAL_TABLET | Freq: Two times a day (BID) | ORAL | 1 refills | Status: DC

## 2020-11-18 MED ORDER — DAPAGLIFLOZIN PROPANEDIOL 10 MG PO TABS: 10.0000 mg | ORAL_TABLET | Freq: Every day | ORAL | 0 refills | Status: DC

## 2020-11-18 NOTE — Progress Notes (Signed)
    Chronic Care Management Pharmacy Assistant   Name: Jonathon Cain  MRN: [id number] DOB: 12-10-1960   Reason for Encounter: Patient phone call  Patient called CPA to inquire about upcoming pharmacy order with [facility]. Patient was recently onboarded and wanted to confirm the pharmacy makes sure the directions on his Metoprolol 12.5 mg are 1 tablet every other night. I confirmed current pharmacy list with patient and also confirmed this is the directions they have. Also sent a message to pharmacy chat to also confirm as well. Patient voiced understanding and thanked me for the confirmation.    [name], [place] Pharmacist Assistant  [phone]  Time Spent: 10 minutes

## 2020-11-18 NOTE — Progress Notes (Signed)
    Chronic Care Management Pharmacy Assistant   Name: Jonathon Cain  MRN: [id number] DOB: 1960-07-18   Reason for Encounter: CMA Call   Called Dr Nigel Mormon, MD office to initiate new prescriptions to be sent to [facility]. Spoke to [place] and reviewed patient's medication list. She will have all prescriptions sent electronically to [facility] for patient. I also advised her of PAP forms for [employer] and [employer]ene Loll that have been filled out for patient and mailed to him to complete and provide their office to complete final steps and send in. She voiced understanding./ls,CMA     [name], [place] Pharmacist Assistant  [phone]  Time Spent: 12 minutes

## 2020-11-20 ENCOUNTER — Telehealth: Payer: Self-pay

## 2020-11-20 ENCOUNTER — Ambulatory Visit: Payer: Medicare HMO | Admitting: Cardiology

## 2020-11-20 ENCOUNTER — Encounter: Payer: Self-pay | Admitting: Cardiology

## 2020-11-20 ENCOUNTER — Other Ambulatory Visit: Payer: Self-pay

## 2020-11-20 VITALS — BP 101/50 | HR 48 | Temp 97.8°F | Resp 17 | Ht 70.0 in | Wt 276.0 lb

## 2020-11-20 DIAGNOSIS — E782 Mixed hyperlipidemia: Secondary | ICD-10-CM

## 2020-11-20 DIAGNOSIS — I739 Peripheral vascular disease, unspecified: Secondary | ICD-10-CM

## 2020-11-20 DIAGNOSIS — I5020 Unspecified systolic (congestive) heart failure: Secondary | ICD-10-CM

## 2020-11-20 DIAGNOSIS — I25118 Atherosclerotic heart disease of native coronary artery with other forms of angina pectoris: Secondary | ICD-10-CM | POA: Diagnosis not present

## 2020-11-20 MED ORDER — ENTRESTO 49-51 MG PO TABS: 1.0000 | ORAL_TABLET | Freq: Two times a day (BID) | ORAL | 3 refills | Status: DC

## 2020-11-20 MED ORDER — DAPAGLIFLOZIN PROPANEDIOL 10 MG PO TABS: 10.0000 mg | ORAL_TABLET | Freq: Every day | ORAL | 3 refills | Status: DC

## 2020-11-20 NOTE — Telephone Encounter (Signed)
Patient assistance faxed for entresto and farxiga

## 2020-11-20 NOTE — Progress Notes (Signed)
Patient referred by Maximiano Coss, NP for CAD  Subjective:   Jonathon Cain, male    DOB: 01-08-1961, 60 y.o.   MRN: [id number]   Chief Complaint  Patient presents with   HFrEF (heart failure with reduced ejection fraction)    Coronary artery disease of native artery of native heart wi   Peripheral arterial disease    Follow-up    2 week     HPI  60 y.o. 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  Labs stable.  Exertional dyspnea has improved.  He stopped [place] due to generalized itching.  Symptoms have improved since stopping it.  Current Outpatient Medications on File Prior to Visit  Medication Sig Dispense Refill   atorvastatin (LIPITOR) 80 MG tablet TAKE 1 TABLET(80 MG) BY MOUTH DAILY 90 tablet 0   cetirizine (ZYRTEC) 10 MG tablet Take 10 mg by mouth daily as needed for allergies.     clopidogrel (PLAVIX) 75 MG tablet Take 1 tablet (75 mg total) by mouth daily. 90 tablet 0   dapagliflozin propanediol (FARXIGA) 10 MG TABS tablet Take 1 tablet (10 mg total) by mouth daily before breakfast. 90 tablet 0   fluticasone (FLONASE) 50 MCG/ACT nasal spray Place 2 sprays into both nostrils daily as needed for allergies or rhinitis.      furosemide (LASIX) 40 MG tablet Take 1 tablet (40 mg total) by mouth as needed. Take only if you have more than usual leg swelling 30 tablet 1   metoprolol succinate (TOPROL-XL) 25 MG 24 hr tablet Take 0.5 tablets (12.5 mg total) by mouth every other day. 45 tablet 2   rivaroxaban (XARELTO) 2.5 MG TABS tablet Take 1 tablet (2.5 mg total) by mouth 2 (two) times daily. 180 tablet 0   sacubitril-valsartan (ENTRESTO) 49-51  MG Take 1 tablet by mouth 2 (two) times daily. 60 tablet 1   spironolactone (ALDACTONE) 25 MG tablet TAKE 1 TABLET(25 MG) BY MOUTH DAILY 90 tablet 1   tamsulosin (FLOMAX) 0.4 MG CAPS capsule Take 1 capsule (0.4 mg total) by mouth daily as needed (urine flow). 90 capsule 1   Current Facility-Administered Medications on File Prior to Visit  Medication Dose Route Frequency Provider Last Rate Last Admin   sacubitril-valsartan (ENTRESTO) 49-51 mg per tablet  1 tablet Oral BID Patwardhan, Reynold Bowen, MD        Cardiovascular and other pertinent studies:  Lexiscan Tetrofosmin stress test 09/18/2020: Lexiscan nuclear stress test performed using 1-day protocol. Patient experienced dyspnea and chest discomfort SPECT images showed large sized, moderate intensity, fixed perfusion defect in mid to apical, anterior, inferior, and apical myocardium. There is severe decrease in myocardial thickening and wall motion. Stress LVEF 12%. High risk study.  Echocardiogram 09/18/2020:  Left ventricle cavity is moderately dilated. Normal left ventricular wall  thickness. Moderate global hypokinesis with apical dyskinesis.  LV  encocardial poorly visualized. LVEF probably around 35%.  Doppler evidence  of grade III (restricted) diastolic dysfunction, normal LAP.  Left atrial cavity is mildly dilated.  Moderate (Grade II) mitral regurgitation.  Mild tricuspid regurgitation.  No evidence of pulmonary hypertension.  No significant change compared to previous study on 10/24/2019.  PV intervention 05/21/2020: Left SFA CTO Successful  retrograde revascularization with 5.0X150 mm Ranger DCB  EKG 05/28/2020: Sinus rhythm 51 bpm First degree A-V block  Left anterior fascicular block Poor R-wave progression Nonspecific ST changes  05/21/2020: Left SFA CTO Successful retrograde revascularization with 5.0X150 mm Ranger DCB   ABI 04/17/2020:  This exam reveals normal perfusion of the right lower extremity (ABI  1.00). There  is mildly abnormal biphasic waveform at the right ankle.  This exam reveals moderately decreased perfusion of the left lower  extremity, noted at the post tibial artery level (ABI 0.75). Severely  abnormal monophasic waveform at the left ankle.  Compared to 03/07/2020, right ABI 0.75 and left ABI 0.66.  Peripheral intervention 03/19/2020: Rt CIA 80% stenosis Lt CIA 95% stenosis Rt SFA short segment of CTO 3 vessel below the knee runoff Lt SFA short segment of CTO Occluded Lt ATA with 2 vessel below the knee runoff   PTA Rt SFA 5.0X80 mm In.Pact drug coated balloon PTA and stenting Rt CIA 9.0X40 mm Eluvia drug eluting stent PTA and stenting Rt CIA 10.0X40 mm Eluvia drug eluting stent   Will stage Lt SFA revascularization, if clinically indicated  EKG 02/02/2020: Junctional rhythm/einus bradycardia 42 bpm Left anterolateral infarct Left anterior fascicular block No ischemic  Cardiac MRI 02/01/2020: 1. Subendocardial late gadolinium enhancement consistent with prior infarct in LAD territory. There is >50% transmural LGE suggesting nonviability in mid anterior/anteroseptal walls, apical anterior/septal/inferior walls, and apex. 2. Mild LV dilatation with moderate systolic function (EF 28%). Akinesis of mid to apical anterior, mid anteroseptal, apical septal, apical inferior, and apex. 3.  Normal RV size and systolic function (EF 76%) 4.  Moderate mitral regurgitation (regurgitant fraction 24%) 5.  Moderate right pleural effusion   Coronary angiography 11/14/2019: LM: Diffuse 30% disease. IVUS MLA 7.3 mm2,. dFR 1.0 LAD: Patent prox LAD stent with udner expansion        Optimization with 3.5X15 mm Reform balloon at 22 atm        Excellent expansion as seen on IVUS        TIMI flow II-->III        Diag: Mod diffuse disease LCx: 80% ostial OM1 stenosis. dFR 0.98 RCA: Not engaged today  Echocardiogram 10/24/2019:  Left ventricle cavity is mildly dilated. Distal anteroseptal and  apical  akinesis. LV endocardial border not well visualized to accurately asses  EF, probably around 30%. Recommend alternate imaging modality to  accurately assess EF.   Doppler evidence of grade III (restrictive)  diastolic dysfunction.  Left atrial cavity is severely dilated.  Moderate (Grade III) mitral regurgitation.  Inadequate TR jet to estimate PASP. Estimated RA pressure 8 mmHg.  No significant change compared to previous study on 07/27/2019.   Left Heart Catheterization 07/26/19 (Dr. Einar Gip):  LM: Mild diffuse disease LAD: Very large caliber vessel, occluded at the proximal end.  Gives origin to high D1 which is large.  Successful PTCA and stenting of the proximal LAD overlapping D1, 3.5 x 24 mm Synergy DES deployed at 12 atmospheric pressure for 60 S. 100% to 0% with TIMI 0 to TIMI-3 flow. LCx: Large vessel, giving origin to a very large OM1 which is secondary branches.  Ostium of OM1 has 80% stenosis. RCA: Mild diffuse disease, distal RCA 15 to 20% stenosis, PDA 50% stenosis.   Recommendation: Patient will need aggressive risk factor modification, dual antiplatelet therapy with aspirin and Brilinta for a year, will be watched carefully in the intensive care unit for preshock, suspect his EDP is extremely high  at the end of the procedure with excessive contrast use.  Will monitor for any contrast nephropathy as well.  Recent labs: 10/29/2020: Glucose 86, BUN/Cr 12/1.08. EGFR 78. Na/K 141/4.2.  NT pro BNP 733    04/19/2020: Chol 119, TG 75, HDL 40, LDL 64  02/19/2020: Glucose 90, BUN/Cr 21/1.04. EGFR 77. Na/K 136/4.0.  AST/ALT 43/55, AlKP 216 H/H 16/50. MCV 87. Platelets 258   11/02/2019: Glucose 85, BUN/Cr 15/1.18. EGFR 66. Na/K 138/4.7.  H/H 14/46. MCV 91. Platelets 222  08/17/2019: Glucose 90, BUN/Cr 17/1.21. EGFR 65. Na/K 136/4.9.  H/H 14/44. MCV 90. Platelets 289  07/26/2019: Glucose 122, BUN/Cr 9/0.9. EGFR 60. H/H 16.1/49.0. MCV 90.2. Platelets 240 HbA1C 5.5% Chol  230, TG 95, HDL 36, LDL 175   Review of Systems  Cardiovascular:  Positive for chest pain and dyspnea on exertion (Stable class II). Negative for leg swelling, palpitations and syncope.  Gastrointestinal:  Positive for diarrhea.        There were no vitals filed for this visit.    There is no height or weight on file to calculate BMI. There were no vitals filed for this visit.     Objective:   Physical Exam Vitals and nursing note reviewed.  Constitutional:      General: He is not in acute distress. Neck:     Vascular: No JVD.  Cardiovascular:     Rate and Rhythm: Normal rate and regular rhythm.     Heart sounds: Normal heart sounds. No murmur heard. Pulmonary:     Effort: Pulmonary effort is normal.     Breath sounds: Normal breath sounds. No wheezing or rales.  Musculoskeletal:     Right lower leg: No edema.     Left lower leg: No edema.         Assessment & Recommendations:   60 y.o. 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 Residual mild below the knee disease Continue Plavix 75 mg daily, Xarelto 2.5 mg bid  CAD: Constant chest pain.  Large area of LAD infarct, but no ischemia on stress test 09/2020.  S/p primary PCI to culprit prox LAD (07/2019) dFR negative 80% stenosis in OM1 (43154) LAD stent optimized under IVUS (10/2019) Continue Plavix 75 mg daily, Xarelto 2.5 mg bid Continue Lipitor 80. LDL down to 64.  Ischemic cardiomyopathy: Stable NYHA class II. NT pro BNP 700. EF has stable around 35% (echocardiogram 08/2020). EF around 35%, MRI noted EF of 36% in 2021. Currently on Entesto to 49-51 mg bid, spironolactone 25  mg daily, metoprolol succinate-prefers to take 12.5 mg every other day. Generalized itching due to [place], he wants to stop.  Continue Lasix only as needed. I discussed referral for Biostim. He wants to think about it.   F/u in 3 months   [place], MD [facility] Pager: [phone] Office: [phone]

## 2020-11-21 ENCOUNTER — Telehealth: Payer: Self-pay

## 2020-11-21 ENCOUNTER — Encounter: Payer: Self-pay | Admitting: Cardiology

## 2020-11-21 NOTE — Progress Notes (Signed)
    Chronic Care Management Pharmacy Assistant   Name: Kaile Kyger  MRN: [id number] DOB: 04/28/1960   Reason for Encounter: Patient to [place] Call  Patient called to inform CCM group that his Cardiologist took him off Farxiga yesterday due to him having itching while on the medication. I sent a message to the pharmacy at [facility] to inform this of the medication change and to have it updated on his profile for his upcoming med order. Pharmacy staff Chastity Dingess acknowledged this change. We will d/c PAP application for Wilder Glade but will continue with [facility] as planned. Patient will call and update once outcome is received from PAP for Entresto. [place], [place] Pharmacist Assistant  [phone]  Time Spent: 10 minutes

## 2020-12-02 ENCOUNTER — Telehealth: Payer: Self-pay

## 2020-12-02 NOTE — Progress Notes (Signed)
Chronic Care Management Pharmacy Assistant   Name: Jonathon Cain  MRN: [id number] DOB: 05/26/1960   Reason for Encounter: Monthly Medication Coordination Call     Recent office visits:  None noted.   Recent consult visits:  11/20/20 Vernell Leep, MD - Cardiology - Heart Failure with Reduced Ejection Fraction - Wilder Glade was discontinued due to itching. Continue Lasix only as needed. Follow up in 3 months.   Hospital visits:  None in previous 6 months  Medications: Outpatient Encounter Medications as of 12/02/2020  Medication Sig   atorvastatin (LIPITOR) 80 MG tablet TAKE 1 TABLET(80 MG) BY MOUTH DAILY   cetirizine (ZYRTEC) 10 MG tablet Take 10 mg by mouth daily as needed for allergies.   clopidogrel (PLAVIX) 75 MG tablet Take 1 tablet (75 mg total) by mouth daily.   dapagliflozin propanediol (FARXIGA) 10 MG TABS tablet Take 1 tablet (10 mg total) by mouth daily before breakfast.   fluticasone (FLONASE) 50 MCG/ACT nasal spray Place 2 sprays into both nostrils daily as needed for allergies or rhinitis.    furosemide (LASIX) 40 MG tablet Take 1 tablet (40 mg total) by mouth as needed. Take only if you have more than usual leg swelling   metoprolol succinate (TOPROL-XL) 25 MG 24 hr tablet Take 0.5 tablets (12.5 mg total) by mouth every other day.   rivaroxaban (XARELTO) 2.5 MG TABS tablet Take 1 tablet (2.5 mg total) by mouth 2 (two) times daily.   sacubitril-valsartan (ENTRESTO) 49-51 MG Take 1 tablet by mouth 2 (two) times daily.   spironolactone (ALDACTONE) 25 MG tablet TAKE 1 TABLET(25 MG) BY MOUTH DAILY   tamsulosin (FLOMAX) 0.4 MG CAPS capsule Take 1 capsule (0.4 mg total) by mouth daily as needed (urine flow).   No facility-administered encounter medications on file as of 12/02/2020.    Reviewed chart for medication changes ahead of medication coordination call.  No OVs, Consults, or hospital visits since last care coordination call/Pharmacist visit. (If appropriate,  list visit date, provider name)  No medication changes indicated OR if recent visit, treatment plan here.  BP Readings from Last 3 Encounters:  11/20/20 (!) 101/50  11/04/20 (!) 114/59  10/03/20 123/75    Lab Results  Component Value Date   HGBA1C 5.5 07/26/2019     Patient obtains medications through Adherence Packaging  90 Days   Last adherence delivery included: (medication name and frequency)  This is patient's first full delivery with [facility]  Patient declined (meds) last month due to PRN use/additional supply on hand. Explanation of abundance on hand (ie #30 due to overlapping fills or previous adherence issues etc)  Patient is due for next adherence delivery on: 12/11/2020. Called patient and reviewed medications and coordinated delivery.  This delivery to include:  Spirolactone 25 mg 1 tablet daily Entresto 49/51 1 tablet daily Metoprolol ER 25 mg  1/2 tablet every other day Atorvastatin 80 mg 1 tablet daily (after  Clopidogrel 75 mg 1 tablet daily (after  Pantoprazole 40 mg 1 tablet daily with breakfast   Patient needs refills for Atorvastatin 80 mg, Clopidogrel 75 mg, Xarelto 2.5 (all medications were requested from Dr. Bonney Roussel office on 11/18/20. Wilder Glade and Entresto PAP applications were faxed in by Dr Ardelle Park office per chart review note from 11/20/20) Per patient report patient was taken off Farxiga on 11/20/20. Patient has over a 90 day supply on hand of Xarelto and does not need included in this delivery.   Confirmed delivery date of 12/11/20, advised  patient that pharmacy will contact them the morning of delivery.  Patient declined Xarelto 2.5 mg as patient has an abundance on hand.  Requested only a 30 day supply only of Entresto 49/51 (in vial) and patient has several on hand of Atorvastatin 80 mg and Plavix 75 mg but not enough for a full 90 days. Patient counted medication and confirmed amount on hand and pharmacy confirmed they will program  packaging to begin with these 2 medications on that given sync date.   Care Gaps  AWV: overdue Colonoscopy: due 06/28/28 DM Eye Exam: N/A DEXA: N/A Mammogram: N/A   Star Rating Drugs: Atorvastatin (LIPITOR) 80 MG tablet - last filled 09/02/20 90 days  Sacubitril-valsartan (ENTRESTO) 49-51 MG - last filled 09/18/20 30 days     Future Appointments  Date Time Provider [place]  12/03/2020  3:10 PM Maximiano Coss, NP [facility] [place]  01/16/2021 11:00 AM Lavonna Monarch, MD [facility]  02/19/2021 10:30 AM Patwardhan, Reynold Bowen, MD [facility] None   [name], [place] Clinical Pharmacist Assistant  [phone]  Time Spent: 58 minutes

## 2020-12-03 ENCOUNTER — Encounter: Payer: Self-pay | Admitting: Registered Nurse

## 2020-12-03 ENCOUNTER — Ambulatory Visit (INDEPENDENT_AMBULATORY_CARE_PROVIDER_SITE_OTHER): Payer: Medicare HMO | Admitting: Registered Nurse

## 2020-12-03 ENCOUNTER — Other Ambulatory Visit: Payer: Self-pay

## 2020-12-03 VITALS — BP 114/61 | HR 60 | Temp 98.3°F | Resp 16 | Ht 70.0 in | Wt 277.6 lb

## 2020-12-03 DIAGNOSIS — I5020 Unspecified systolic (congestive) heart failure: Secondary | ICD-10-CM

## 2020-12-03 DIAGNOSIS — E782 Mixed hyperlipidemia: Secondary | ICD-10-CM | POA: Diagnosis not present

## 2020-12-03 DIAGNOSIS — Z87891 Personal history of nicotine dependence: Secondary | ICD-10-CM

## 2020-12-03 DIAGNOSIS — Z23 Encounter for immunization: Secondary | ICD-10-CM | POA: Diagnosis not present

## 2020-12-03 DIAGNOSIS — H3552 Pigmentary retinal dystrophy: Secondary | ICD-10-CM

## 2020-12-03 NOTE — Progress Notes (Signed)
Established Patient Office Visit  Subjective:  Patient ID: Jonathon Cain, male    DOB: [birth year]  Age: 60 y.o. MRN: [id number]  CC:  Chief Complaint  Patient presents with   Follow-up    Patient states he is here for his 6 month follow up. Patient states he has also been very fatigue and coughing up phlegm awhile    HPI Jonathon Cain presents for 6 mo follow up   Urinary symptoms: Improved with new flomax No acute concerns Stable  CHF Stable. Persistent retrosternal chest pain assessed with stress test and echo in June of this year showing stable findings - EF of 35%, 36% in 2021. Dr. Virgina Jock increased Delene Loll to 49-15m po qd. He has been tolerating well Cardiology also recommended bioStim. He is considering this.   Skin lesions Referred initially for lipoma on back. Found on exam to have concerning lesions, including a melanoma and BCC These have been excised by Dr. TDenna HaggardHe will have follow up appt with him on 01/16/21  Retinitis  Stable [facility]   Past Medical History:  Diagnosis Date   Allergy    Bladder outlet obstruction    Cancer (HCC)    Skin   CHF (congestive heart failure) (HCC)    Coronary artery disease    Hyperlipidemia    Hypertension    Retinitis pigmentosa     Past Surgical History:  Procedure Laterality Date   ABDOMINAL AORTOGRAM W/LOWER EXTREMITY N/A 03/19/2020   Procedure: ABDOMINAL AORTOGRAM W/LOWER EXTREMITY;  Surgeon: PNigel Mormon MD;  [facility];  Service: Cardiovascular;  Laterality: N/A;   CARDIAC CATHETERIZATION     CATARACT EXTRACTION Bilateral 2004   CORONARY BALLOON ANGIOPLASTY N/A 11/14/2019   Procedure: CORONARY BALLOON ANGIOPLASTY;  Surgeon: PNigel Mormon MD;  [facility];  Service: Cardiovascular;  Laterality: N/A;   CORONARY STENT INTERVENTION N/A 07/26/2019   Procedure: CORONARY STENT INTERVENTION;  Surgeon: GAdrian Prows MD;   [facility];  Service: Cardiovascular;  Laterality: N/A;   CORONARY/GRAFT ACUTE MI REVASCULARIZATION N/A 07/26/2019   Procedure: CORONARY/GRAFT ACUTE MI REVASCULARIZATION;  Surgeon: GAdrian Prows MD;  [facility];  Service: Cardiovascular;  Laterality: N/A;   EYE SURGERY     INTRAVASCULAR PRESSURE WIRE/FFR STUDY N/A 11/14/2019   Procedure: INTRAVASCULAR PRESSURE WIRE/FFR STUDY;  Surgeon: PNigel Mormon MD;  [facility];  Service: Cardiovascular;  Laterality: N/A;   INTRAVASCULAR ULTRASOUND/IVUS N/A 11/14/2019   Procedure: Intravascular Ultrasound/IVUS;  Surgeon: PNigel Mormon MD;  [facility];  Service: Cardiovascular;  Laterality: N/A;   LEFT HEART CATH AND CORONARY ANGIOGRAPHY N/A 07/26/2019   Procedure: LEFT HEART CATH AND CORONARY ANGIOGRAPHY;  Surgeon: GAdrian Prows MD;  [facility];  Service: Cardiovascular;  Laterality: N/A;   LOWER EXTREMITY ANGIOGRAPHY Left 05/21/2020   Procedure: LOWER EXTREMITY ANGIOGRAPHY;  Surgeon: PNigel Mormon MD;  [facility];  Service: Cardiovascular;  Laterality: Left;   PERIPHERAL VASCULAR BALLOON ANGIOPLASTY Right 03/19/2020   Procedure: PERIPHERAL VASCULAR BALLOON ANGIOPLASTY;  Surgeon: PNigel Mormon MD;  [facility];  Service: Cardiovascular;  Laterality: Right;  SFA   PERIPHERAL VASCULAR BALLOON ANGIOPLASTY  05/21/2020   Procedure: PERIPHERAL VASCULAR BALLOON ANGIOPLASTY;  Surgeon: PNigel Mormon MD;  [facility];  Service: Cardiovascular;;  left SFA   PERIPHERAL VASCULAR INTERVENTION Bilateral  03/19/2020   Procedure: PERIPHERAL VASCULAR INTERVENTION;  Surgeon: Nigel Mormon, MD;  Location: [facility];  Service: Cardiovascular;  Laterality: Bilateral;  External Iliac   TUMOR REMOVAL  1961   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    Family History  Problem Relation Age of Onset    Heart disease Mother    Hyperlipidemia Mother    Asthma Mother    Heart disease Father    Hypertension Father    Heart disease Maternal Grandmother    Heart disease Maternal Grandfather    Heart disease Paternal Grandmother    Heart disease Paternal Grandfather     Social History   Socioeconomic History   Marital status: Single    Spouse name: Not on file   Number of children: 0   Years of education: 16   Highest education level: Bachelor's degree (e.g., BA, AB, BS)  Occupational History   Not on file  Tobacco Use   Smoking status: Former    Packs/day: 1.00    Years: 10.00    Pack years: 10.00    Types: Cigarettes    Quit date: 07/26/2019    Years since quitting: 1.3   Smokeless tobacco: Never  Vaping Use   Vaping Use: Never used  Substance and Sexual Activity   Alcohol use: Yes    Comment: occasional   Drug use: No   Sexual activity: Not Currently  Other Topics Concern   Not on file  Social History Narrative   Not on file   Social Determinants of Health   Financial Resource Strain: Not on file  Food Insecurity: Not on file  Transportation Needs: Not on file  Physical Activity: Not on file  Stress: Not on file  Social Connections: Not on file  Intimate Partner Violence: Not on file    Outpatient Medications Prior to Visit  Medication Sig Dispense Refill   atorvastatin (LIPITOR) 80 MG tablet TAKE 1 TABLET(80 MG) BY MOUTH DAILY 90 tablet 0   cetirizine (ZYRTEC) 10 MG tablet Take 10 mg by mouth daily as needed for allergies.     clopidogrel (PLAVIX) 75 MG tablet Take 1 tablet (75 mg total) by mouth daily. 90 tablet 0   dapagliflozin propanediol (FARXIGA) 10 MG TABS tablet Take 1 tablet (10 mg total) by mouth daily before breakfast. 90 tablet 3   fluticasone (FLONASE) 50 MCG/ACT nasal spray Place 2 sprays into both nostrils daily as needed for allergies or rhinitis.      furosemide (LASIX) 40 MG tablet Take 1 tablet (40 mg total) by mouth as needed. Take only if  you have more than usual leg swelling 30 tablet 1   metoprolol succinate (TOPROL-XL) 25 MG 24 hr tablet Take 0.5 tablets (12.5 mg total) by mouth every other day. 45 tablet 2   rivaroxaban (XARELTO) 2.5 MG TABS tablet Take 1 tablet (2.5 mg total) by mouth 2 (two) times daily. 180 tablet 0   sacubitril-valsartan (ENTRESTO) 49-51 MG Take 1 tablet by mouth 2 (two) times daily. 180 tablet 3   spironolactone (ALDACTONE) 25 MG tablet TAKE 1 TABLET(25 MG) BY MOUTH DAILY 90 tablet 1   tamsulosin (FLOMAX) 0.4 MG CAPS capsule Take 1 capsule (0.4 mg total) by mouth daily as needed (urine flow). 90 capsule 1   No facility-administered medications prior to visit.    No Known Allergies  ROS Review of Systems  Constitutional: Negative.   HENT: Negative.    Eyes: Negative.   Respiratory: Negative.  Cardiovascular: Negative.   Gastrointestinal: Negative.   Genitourinary: Negative.   Musculoskeletal: Negative.   Skin: Negative.   Neurological: Negative.   Psychiatric/Behavioral: Negative.    All other systems reviewed and are negative.    Objective:    Physical Exam Constitutional:      General: He is not in acute distress.    Appearance: Normal appearance. He is normal weight. He is not ill-appearing, toxic-appearing or diaphoretic.  Cardiovascular:     Rate and Rhythm: Normal rate and regular rhythm.     Heart sounds: Normal heart sounds. No murmur heard.   No friction rub. No gallop.  Pulmonary:     Effort: Pulmonary effort is normal. No respiratory distress.     Breath sounds: Normal breath sounds. No stridor. No wheezing, rhonchi or rales.  Chest:     Chest wall: No tenderness.  Neurological:     General: No focal deficit present.     Mental Status: He is alert and oriented to person, place, and time. Mental status is at baseline.  Psychiatric:        Mood and Affect: Mood normal.        Behavior: Behavior normal.        Thought Content: Thought content normal.        Judgment:  Judgment normal.    BP 114/61   Pulse 60   Temp 98.3 F (36.8 C) (Temporal)   Resp 16   Ht _0  (1.778 m)   Wt 277 lb 9.6 oz (125.9 kg)   SpO2 98%   BMI 39.83 kg/m  Wt Readings from Last 3 Encounters:  12/03/20 277 lb 9.6 oz (125.9 kg)  11/20/20 276 lb (125.2 kg)  11/04/20 277 lb (125.6 kg)     Health Maintenance Due  Topic Date Due   Pneumococcal Vaccine 45-62 Years old (1 - PCV) Never done   INFLUENZA VACCINE  Never done    There are no preventive care reminders to display for this patient.  No results found for: TSH Lab Results  Component Value Date   WBC 13.3 (H) 11/13/2020   HGB 15.8 11/13/2020   HCT 49.0 11/13/2020   MCV 86 11/13/2020   PLT 278 11/13/2020   Lab Results  Component Value Date   NA 138 11/13/2020   K 4.8 11/13/2020   CHLORIDE 103 12/03/2016   CO2 22 11/13/2020   GLUCOSE 72 11/13/2020   BUN 13 11/13/2020   CREATININE 0.95 11/13/2020   BILITOT 1.1 02/19/2020   ALKPHOS 216 (H) 02/19/2020   AST 43 (H) 02/19/2020   ALT 55 (H) 02/19/2020   PROT 7.4 02/19/2020   ALBUMIN 3.9 02/19/2020   CALCIUM 9.1 11/13/2020   ANIONGAP 9 10/25/2019   EGFR 90 11/13/2020   GFR 77.54 02/19/2020   Lab Results  Component Value Date   CHOL 119 04/19/2020   Lab Results  Component Value Date   HDL 40 04/19/2020   Lab Results  Component Value Date   LDLCALC 64 04/19/2020   Lab Results  Component Value Date   TRIG 75 04/19/2020   Lab Results  Component Value Date   CHOLHDL 3.0 04/19/2020   Lab Results  Component Value Date   HGBA1C 5.5 07/26/2019      Assessment & Plan:   Problem List Items Addressed This Visit       Cardiovascular and Mediastinum   HFrEF (heart failure with reduced ejection fraction) ([place]) - Primary     Other  Retinitis pigmentosa   Mixed hyperlipidemia   Other Visit Diagnoses     Pneumococcal vaccination administered at current visit       Relevant Orders   Pneumococcal conjugate vaccine 20-valent (Prevnar 20)    Flu vaccine need       Relevant Orders   Flu Vaccine QUAD 6+ mos PF IM (Fluarix Quad PF)   Smoking history       Relevant Orders   CT CHEST LUNG CA SCREEN LOW DOSE W/O CM       No orders of the defined types were placed in this encounter.   Follow-up: Return in about 1 year (around 12/03/2021) for chronic condiitons.   PLAN Ordered CT scan low dose - will follow up as warranted Flu shot and pna vacc given today Return annually  I spent 42 minutes with this patient in chart review. Discussing prognosis, discussing recent procedures, coordinating medications.  Maximiano Coss, NP

## 2020-12-03 NOTE — Patient Instructions (Addendum)
Mr. Jonathon Cain -  Always a pleasure  Flu shot and pneumonia shot today.   No need for labs - Dr. Virgina Jock has poked and prodded enough  See you in a year, sooner with concerns. I know specialists are keeping you busy enough  CT scan ordered, will let you know how results look  Call me if you need anything,  [name]    If you have lab work done today you will be contacted with your lab results within the next 2 weeks.  If you have not heard from [place] then please contact us. The fastest way to get your results is to register for My Chart.   IF you received an x-ray today, you will receive an invoice from [facility]. Please contact [facility] at [phone] with questions or concerns regarding your invoice.   IF you received labwork today, you will receive an invoice from [facility]. Please contact [facility] at [phone] with questions or concerns regarding your invoice.   Our billing staff will not be able to assist you with questions regarding bills from these companies.  You will be contacted with the lab results as soon as they are available. The fastest way to get your results is to activate your My Chart account. Instructions are located on the last page of this paperwork. If you have not heard from [place] regarding the results in 2 weeks, please contact this office.

## 2020-12-12 DIAGNOSIS — H3500 Unspecified background retinopathy: Secondary | ICD-10-CM | POA: Diagnosis not present

## 2020-12-12 DIAGNOSIS — D8989 Other specified disorders involving the immune mechanism, not elsewhere classified: Secondary | ICD-10-CM | POA: Diagnosis not present

## 2020-12-12 DIAGNOSIS — R6889 Other general symptoms and signs: Secondary | ICD-10-CM | POA: Diagnosis not present

## 2020-12-12 DIAGNOSIS — H3552 Pigmentary retinal dystrophy: Secondary | ICD-10-CM | POA: Diagnosis not present

## 2020-12-12 DIAGNOSIS — H30123 Disseminated chorioretinal inflammation, peripheral, bilateral: Secondary | ICD-10-CM | POA: Diagnosis not present

## 2020-12-12 DIAGNOSIS — R94112 Abnormal visually evoked potential [VEP]: Secondary | ICD-10-CM | POA: Diagnosis not present

## 2020-12-12 DIAGNOSIS — R94111 Abnormal electroretinogram [ERG]: Secondary | ICD-10-CM | POA: Diagnosis not present

## 2020-12-12 DIAGNOSIS — H53453 Other localized visual field defect, bilateral: Secondary | ICD-10-CM | POA: Diagnosis not present

## 2020-12-12 DIAGNOSIS — Z1589 Genetic susceptibility to other disease: Secondary | ICD-10-CM | POA: Diagnosis not present

## 2020-12-12 DIAGNOSIS — H5362 Acquired night blindness: Secondary | ICD-10-CM | POA: Diagnosis not present

## 2020-12-12 DIAGNOSIS — H469 Unspecified optic neuritis: Secondary | ICD-10-CM | POA: Diagnosis not present

## 2020-12-19 ENCOUNTER — Telehealth: Payer: Self-pay

## 2020-12-19 NOTE — Progress Notes (Signed)
    Chronic Care Management Pharmacy Assistant   Name: Jonathon Cain  MRN: [id number] DOB: 26-Dec-1960   Reason for Encounter: PAP for Entresto    Patient called to report he received a letter from [place] from his PAP application for Entresto. Per [employer] patient's application was denied due to patient being over the 300% poverty level for income requirements. Notified CPP of outcome.   [name], [place] Pharmacist Assistant  [phone]  Time Spent: 10 minutes

## 2020-12-23 ENCOUNTER — Other Ambulatory Visit: Payer: Self-pay | Admitting: Cardiology

## 2020-12-23 DIAGNOSIS — I25118 Atherosclerotic heart disease of native coronary artery with other forms of angina pectoris: Secondary | ICD-10-CM

## 2020-12-23 DIAGNOSIS — I739 Peripheral vascular disease, unspecified: Secondary | ICD-10-CM

## 2020-12-24 ENCOUNTER — Other Ambulatory Visit: Payer: Self-pay | Admitting: *Deleted

## 2020-12-24 DIAGNOSIS — Z87891 Personal history of nicotine dependence: Secondary | ICD-10-CM

## 2020-12-26 ENCOUNTER — Other Ambulatory Visit: Payer: Self-pay | Admitting: Cardiology

## 2020-12-26 DIAGNOSIS — I5020 Unspecified systolic (congestive) heart failure: Secondary | ICD-10-CM

## 2021-01-01 NOTE — Progress Notes (Signed)
Virtual Visit via Video Note  I connected with Jonathon Cain on 01/01/21 at 12:00 PM EDT by a video enabled telemedicine application and verified that I am speaking with the correct person using two identifiers.  Location: Patient: Home  Provider: Working from home    I discussed the limitations of evaluation and management by telemedicine and the availability of in person appointments. The patient expressed understanding and agreed to proceed.   Shared Decision Making Visit Lung Cancer Screening Program [phone])   Eligibility: Age 60 y.o. Pack Years Smoking History Calculation 60 (# packs/per year x # years smoked) Recent History of coughing up blood  no Unexplained weight loss? no ( >Than 15 pounds within the last 6 months ) Prior History Lung / other cancer no (Diagnosis within the last 5 years already requiring surveillance chest CT Scans). Smoking Status Former Smoker Former Smokers: Years since quit: 1 year  Quit Date: 07/2019  Visit Components: Discussion included one or more decision making aids. yes Discussion included risk/benefits of screening. yes Discussion included potential follow up diagnostic testing for abnormal scans. yes Discussion included meaning and risk of over diagnosis. yes Discussion included meaning and risk of False Positives. yes Discussion included meaning of total radiation exposure. yes  Counseling Included: Importance of adherence to annual lung cancer LDCT screening. yes Impact of comorbidities on ability to participate in the program. yes Ability and willingness to under diagnostic treatment. yes  Smoking Cessation Counseling: Current Smokers:  Discussed importance of smoking cessation. yes Information about tobacco cessation classes and interventions provided to patient. yes Patient provided with "ticket" for LDCT Scan. yes Symptomatic Patient. no  Counseling(Intermediate counseling: > three minutes) 99406 Diagnosis Code: Tobacco  Use Z72.0 Asymptomatic Patient no  Counseling (Intermediate counseling: > three minutes counseling) V4098 Former Smokers:  Discussed the importance of maintaining cigarette abstinence. yes Diagnosis Code: Personal History of Nicotine Dependence. J19.147 Information about tobacco cessation classes and interventions provided to patient. Yes Patient provided with "ticket" for LDCT Scan. yes Written Order for Lung Cancer Screening with LDCT placed in Epic. Yes (CT Chest Lung Cancer Screening Low Dose W/O CM) [id number] Z12.2-Screening of respiratory organs Z87.891-Personal history of nicotine dependence   I spent 25 minutes of face to face time with him discussing the risks and benefits of lung cancer screening. We viewed a power point together that explained in detail the above noted topics. We took the time to pause the power point at intervals to allow for questions to be asked and answered to ensure understanding. We discussed that he had taken the single most powerful action possible to decrease his risk of developing lung cancer when he quit smoking. I counseled him to remain smoke free, and to contact me if he ever had the desire to smoke again so that I can provide resources and tools to help support the effort to remain smoke free. We discussed the time and location of the scan, and that either  Doroteo Glassman RN or I will call with the results within  24-48 hours of receiving them. He has my card and contact information in the event he needs to speak with me, in addition to a copy of the power point we reviewed as a resource. He verbalized understanding of all of the above and had no further questions upon leaving the office.     I explained to the patient that there has been a high incidence of coronary artery disease noted on these exams. I explained that  this is a non-gated exam therefore degree or severity cannot be determined. This patient is on statin therapy. I have asked the patient to  follow-up with their PCP regarding any incidental finding of coronary artery disease and management with diet or medication as they feel is clinically indicated. The patient verbalized understanding of the above and had no further questions.   Jonathon Cain D. Jonathon Kingfisher, NP-C [facility] Personal contact information can be found on Amion  01/01/2021, 6:10 PM

## 2021-01-02 ENCOUNTER — Encounter: Payer: Self-pay | Admitting: Acute Care

## 2021-01-02 ENCOUNTER — Telehealth: Payer: Self-pay

## 2021-01-02 ENCOUNTER — Ambulatory Visit (HOSPITAL_BASED_OUTPATIENT_CLINIC_OR_DEPARTMENT_OTHER)
Admission: RE | Admit: 2021-01-02 | Discharge: 2021-01-02 | Disposition: A | Discharge status: Home / Self Care | Payer: Medicare HMO | Source: Ambulatory Visit | Attending: Acute Care | Admitting: Acute Care

## 2021-01-02 ENCOUNTER — Other Ambulatory Visit: Payer: Self-pay

## 2021-01-02 ENCOUNTER — Telehealth: Payer: Medicare HMO | Admitting: Acute Care

## 2021-01-02 DIAGNOSIS — Z87891 Personal history of nicotine dependence: Secondary | ICD-10-CM | POA: Diagnosis not present

## 2021-01-02 NOTE — Progress Notes (Signed)
    Chronic Care Management Pharmacy Assistant   Name: Sierra Bissonette  MRN: [id number] DOB: 07/09/1960   Reason for Encounter: CMA phone call - Possible Entresto Savings offer   Called patient and informed  him CPP would be looking to a saving program for patients Entresto since his PAP was denied. Patient voiced understanding and thanked me for the call. Will follow up with patient once more information if received if he is approved.  [name], [place] Pharmacist Assistant  [phone]  Time Spent: 10 minutes

## 2021-01-08 ENCOUNTER — Other Ambulatory Visit: Payer: Self-pay

## 2021-01-08 ENCOUNTER — Telehealth (INDEPENDENT_AMBULATORY_CARE_PROVIDER_SITE_OTHER): Payer: Medicare HMO | Admitting: Family

## 2021-01-08 ENCOUNTER — Telehealth: Payer: Medicare HMO | Admitting: Registered Nurse

## 2021-01-08 DIAGNOSIS — J329 Chronic sinusitis, unspecified: Secondary | ICD-10-CM | POA: Insufficient documentation

## 2021-01-08 MED ORDER — COVID-19 RAPID SELF TEST KIT VI KIT: PACK | 0 refills | Status: DC

## 2021-01-08 MED ORDER — AMOXICILLIN-POT CLAVULANATE 875-125 MG PO TABS: 1.0000 | ORAL_TABLET | Freq: Two times a day (BID) | ORAL | 0 refills | Status: DC

## 2021-01-08 NOTE — Progress Notes (Signed)
MyChart Video Visit    Virtual Visit via Video Note   This visit type was conducted due to national recommendations for restrictions regarding the COVID-19 Pandemic (e.g. social distancing) in an effort to limit this patient's exposure and mitigate transmission in our community. This patient is at least at moderate risk for complications without adequate follow up. This format is felt to be most appropriate for this patient at this time. Physical exam was limited by quality of the video and audio technology used for the visit. CMA was able to get the patient set up on a video visit.  Patient location: Home Patient and provider in visit Provider location: Office  I discussed the limitations of evaluation and management by telemedicine and the availability of in person appointments. The patient expressed understanding and agreed to proceed.  Visit Date: 01/08/2021  Today's healthcare provider: Nance Pear, NP     Subjective:    Patient ID: Jonathon Cain, male    DOB: 08-20-1960, 60 y.o.   MRN: [id number]  Chief Complaint  Patient presents with   Nasal Congestion    Complains of nasal congestion with right ear fullness and dizziness   Cough    Complains of productive cough   Sore Throat    Complains of some sore throat    Cough Associated symptoms include chills. Pertinent negatives include no fever.  Sore Throat  Associated symptoms include congestion and coughing.  Patient is in today for a office visit.   He complains of cough, sinus congestion and chest congestion for the past 7 days. He has chills and fatigue. He denies having any fevers, pressure in his cheeks or forehead. He has tried OTC nasal spray to manage his symptoms. He is taking zyrtec but has ran out of medication and is planning on getting more. He has not taken a Covid-19 test and is requesting for one to be sent to his house.  He also complains of dizziness from vertigo that recently developed  after an episode of diarrhea.   Past Medical History:  Diagnosis Date   Allergy    Bladder outlet obstruction    Cancer (HCC)    Skin   CHF (congestive heart failure) (HCC)    Coronary artery disease    Hyperlipidemia    Hypertension    Retinitis pigmentosa     Past Surgical History:  Procedure Laterality Date   ABDOMINAL AORTOGRAM W/LOWER EXTREMITY N/A 03/19/2020   Procedure: ABDOMINAL AORTOGRAM W/LOWER EXTREMITY;  Surgeon: Nigel Mormon, MD;  Location: [facility];  Service: Cardiovascular;  Laterality: N/A;   CARDIAC CATHETERIZATION     CATARACT EXTRACTION Bilateral 2004   CORONARY BALLOON ANGIOPLASTY N/A 11/14/2019   Procedure: CORONARY BALLOON ANGIOPLASTY;  Surgeon: Nigel Mormon, MD;  Location: [facility];  Service: Cardiovascular;  Laterality: N/A;   CORONARY STENT INTERVENTION N/A 07/26/2019   Procedure: CORONARY STENT INTERVENTION;  Surgeon: Adrian Prows, MD;  Location: [facility];  Service: Cardiovascular;  Laterality: N/A;   CORONARY/GRAFT ACUTE MI REVASCULARIZATION N/A 07/26/2019   Procedure: CORONARY/GRAFT ACUTE MI REVASCULARIZATION;  Surgeon: Adrian Prows, MD;  Location: [facility];  Service: Cardiovascular;  Laterality: N/A;   EYE SURGERY     INTRAVASCULAR PRESSURE WIRE/FFR STUDY N/A 11/14/2019   Procedure: INTRAVASCULAR PRESSURE WIRE/FFR STUDY;  Surgeon: Nigel Mormon, MD;  Location: [facility];  Service: Cardiovascular;  Laterality: N/A;   INTRAVASCULAR ULTRASOUND/IVUS N/A 11/14/2019   Procedure: Intravascular  Ultrasound/IVUS;  Surgeon: Nigel Mormon, MD;  Location: [facility];  Service: Cardiovascular;  Laterality: N/A;   LEFT HEART CATH AND CORONARY ANGIOGRAPHY N/A 07/26/2019   Procedure: LEFT HEART CATH AND CORONARY ANGIOGRAPHY;  Surgeon: Adrian Prows, MD;  Location: [facility];  Service: Cardiovascular;  Laterality: N/A;   LOWER EXTREMITY ANGIOGRAPHY Left 05/21/2020   Procedure: LOWER EXTREMITY  ANGIOGRAPHY;  Surgeon: Nigel Mormon, MD;  Location: [facility];  Service: Cardiovascular;  Laterality: Left;   PERIPHERAL VASCULAR BALLOON ANGIOPLASTY Right 03/19/2020   Procedure: PERIPHERAL VASCULAR BALLOON ANGIOPLASTY;  Surgeon: Nigel Mormon, MD;  Location: [facility];  Service: Cardiovascular;  Laterality: Right;  SFA   PERIPHERAL VASCULAR BALLOON ANGIOPLASTY  05/21/2020   Procedure: PERIPHERAL VASCULAR BALLOON ANGIOPLASTY;  Surgeon: Nigel Mormon, MD;  Location: [facility];  Service: Cardiovascular;;  left SFA   PERIPHERAL VASCULAR INTERVENTION Bilateral 03/19/2020   Procedure: PERIPHERAL VASCULAR INTERVENTION;  Surgeon: Nigel Mormon, MD;  Location: [facility];  Service: Cardiovascular;  Laterality: Bilateral;  External Iliac   TUMOR REMOVAL  1961   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    Family History  Problem Relation Age of Onset   Heart disease Mother    Hyperlipidemia Mother    Asthma Mother    Heart disease Father    Hypertension Father    Heart disease Maternal Grandmother    Heart disease Maternal Grandfather    Heart disease Paternal Grandmother    Heart disease Paternal Grandfather     Social History   Socioeconomic History   Marital status: Single    Spouse name: Not on file   Number of children: 0   Years of education: 16   Highest education level: Bachelor's degree (e.g., BA, AB, BS)  Occupational History   Not on file  Tobacco Use   Smoking status: Former    Packs/day: 1.50    Years: 40.00    Pack years: 60.00    Types: Cigarettes    Quit date: 07/26/2019    Years since quitting: 1.4   Smokeless tobacco: Never  Vaping Use   Vaping Use: Never used  Substance and Sexual Activity   Alcohol use: Yes    Comment: occasional   Drug use: No   Sexual activity: Not Currently  Other Topics Concern   Not on file  Social History Narrative   Not on file   Social Determinants of Health    Financial Resource Strain: Not on file  Food Insecurity: Not on file  Transportation Needs: Not on file  Physical Activity: Not on file  Stress: Not on file  Social Connections: Not on file  Intimate Partner Violence: Not on file    Outpatient Medications Prior to Visit  Medication Sig Dispense Refill   atorvastatin (LIPITOR) 80 MG tablet TAKE 1 TABLET(80 MG) BY MOUTH DAILY 90 tablet 0   cetirizine (ZYRTEC) 10 MG tablet Take 10 mg by mouth daily as needed for allergies.     clopidogrel (PLAVIX) 75 MG tablet Take 1 tablet (75 mg total) by mouth daily. 90 tablet 0   ENTRESTO 49-51 MG TAKE ONE TABLET BY MOUTH every morning & TAKE ONE TABLET BY MOUTH AT bedtime 60 tablet 1   fluticasone (FLONASE) 50 MCG/ACT nasal spray Place 2 sprays into both nostrils daily as needed for allergies or rhinitis.      furosemide (LASIX) 40 MG tablet Take 1 tablet (40 mg total)  by mouth as needed. Take only if you have more than usual leg swelling 30 tablet 1   metoprolol succinate (TOPROL-XL) 25 MG 24 hr tablet Take 0.5 tablets (12.5 mg total) by mouth every other day. 45 tablet 2   spironolactone (ALDACTONE) 25 MG tablet TAKE 1 TABLET(25 MG) BY MOUTH DAILY 90 tablet 1   tamsulosin (FLOMAX) 0.4 MG CAPS capsule Take 1 capsule (0.4 mg total) by mouth daily as needed (urine flow). 90 capsule 1   XARELTO 2.5 MG TABS tablet TAKE 1 TABLET(2.5 MG) BY MOUTH TWICE DAILY 180 tablet 1   dapagliflozin propanediol (FARXIGA) 10 MG TABS tablet Take 1 tablet (10 mg total) by mouth daily before breakfast. 90 tablet 3   No facility-administered medications prior to visit.    No Known Allergies  Review of Systems  Constitutional:  Positive for chills. Negative for fever.  HENT:  Positive for congestion.   Respiratory:  Positive for cough.        (+)chest congestion  Neurological:  Positive for dizziness.      Objective:    Physical Exam Constitutional:      General: He is not in acute distress.    Appearance:  Normal appearance. He is not ill-appearing.  Pulmonary:     Effort: Pulmonary effort is normal.  Neurological:     Mental Status: He is alert.  Psychiatric:        Behavior: Behavior normal.        Judgment: Judgment normal.    There were no vitals taken for this visit. Wt Readings from Last 3 Encounters:  12/03/20 277 lb 9.6 oz (125.9 kg)  11/20/20 276 lb (125.2 kg)  11/04/20 277 lb (125.6 kg)    Diabetic Foot Exam - Simple   No data filed    Lab Results  Component Value Date   WBC 13.3 (H) 11/13/2020   HGB 15.8 11/13/2020   HCT 49.0 11/13/2020   PLT 278 11/13/2020   GLUCOSE 72 11/13/2020   CHOL 119 04/19/2020   TRIG 75 04/19/2020   HDL 40 04/19/2020   LDLCALC 64 04/19/2020   ALT 55 (H) 02/19/2020   AST 43 (H) 02/19/2020   NA 138 11/13/2020   K 4.8 11/13/2020   CL 98 11/13/2020   CREATININE 0.95 11/13/2020   BUN 13 11/13/2020   CO2 22 11/13/2020   INR 1.0 07/26/2019   HGBA1C 5.5 07/26/2019    No results found for: TSH Lab Results  Component Value Date   WBC 13.3 (H) 11/13/2020   HGB 15.8 11/13/2020   HCT 49.0 11/13/2020   MCV 86 11/13/2020   PLT 278 11/13/2020   Lab Results  Component Value Date   NA 138 11/13/2020   K 4.8 11/13/2020   CHLORIDE 103 12/03/2016   CO2 22 11/13/2020   GLUCOSE 72 11/13/2020   BUN 13 11/13/2020   CREATININE 0.95 11/13/2020   BILITOT 1.1 02/19/2020   ALKPHOS 216 (H) 02/19/2020   AST 43 (H) 02/19/2020   ALT 55 (H) 02/19/2020   PROT 7.4 02/19/2020   ALBUMIN 3.9 02/19/2020   CALCIUM 9.1 11/13/2020   ANIONGAP 9 10/25/2019   EGFR 90 11/13/2020   GFR 77.54 02/19/2020   Lab Results  Component Value Date   CHOL 119 04/19/2020   Lab Results  Component Value Date   HDL 40 04/19/2020   Lab Results  Component Value Date   LDLCALC 64 04/19/2020   Lab Results  Component Value Date   TRIG  75 04/19/2020   Lab Results  Component Value Date   CHOLHDL 3.0 04/19/2020   Lab Results  Component Value Date   HGBA1C 5.5  07/26/2019       Assessment & Plan:   Problem List Items Addressed This Visit       Unprioritized   Sinusitis - Primary    New.  Will rx with augmentin $RemoveBefore'875mg'[id number]$  bid x 10 days. Recommended that he restart flonase and zyrtec. Add mucinex prn congestion. In addition, pt is advised to call if symptoms worsen or if symptoms fail to improve.   I did suggest that he complete a home covid test.  Rx sent to his pharmacy.       Relevant Medications   amoxicillin-clavulanate (AUGMENTIN) 875-125 MG tablet     Meds ordered this encounter  Medications   amoxicillin-clavulanate (AUGMENTIN) 875-125 MG tablet    Sig: Take 1 tablet by mouth 2 (two) times daily.    Dispense:  20 tablet    Refill:  0    Order Specific Question:   Supervising Provider    Answer:   Penni Homans A [[id number]]   COVID-19 At Home Antigen Test (COVID-19 RAPID SELF TEST KIT) KIT    Sig: Use as directed.    Dispense:  2 each    Refill:  0    Order Specific Question:   Supervising Provider    Answer:   Penni Homans A [[id number]]    I discussed the assessment and treatment plan with the patient. The patient was provided an opportunity to ask questions and all were answered. The patient agreed with the plan and demonstrated an understanding of the instructions.   The patient was advised to call back or seek an in-person evaluation if the symptoms worsen or if the condition fails to improve as anticipated.  I,Shehryar Multimedia programmer as a Education administrator for [employer], NP.,have documented all relevant documentation on the behalf of Nance Pear, NP,as directed by  Nance Pear, NP while in the presence of Nance Pear, NP.  I provided 20 minutes of face-to-face time during this encounter.   Nance Pear, NP [employer] [facility] [employer] [phone] (phone) [phone] (fax)  [place]

## 2021-01-08 NOTE — Assessment & Plan Note (Addendum)
New.  Will rx with augmentin 875mg  bid x 10 days. Recommended that he restart flonase and zyrtec. Add mucinex prn congestion. In addition, pt is advised to call if symptoms worsen or if symptoms fail to improve.   I did suggest that he complete a home covid test.  Rx sent to his pharmacy.

## 2021-01-16 ENCOUNTER — Encounter: Payer: Self-pay | Admitting: Dermatology

## 2021-01-16 ENCOUNTER — Ambulatory Visit (INDEPENDENT_AMBULATORY_CARE_PROVIDER_SITE_OTHER): Payer: Medicare HMO | Admitting: Dermatology

## 2021-01-16 ENCOUNTER — Other Ambulatory Visit: Payer: Self-pay

## 2021-01-16 DIAGNOSIS — C44619 Basal cell carcinoma of skin of left upper limb, including shoulder: Secondary | ICD-10-CM | POA: Diagnosis not present

## 2021-01-16 NOTE — Patient Instructions (Signed)

## 2021-01-20 ENCOUNTER — Other Ambulatory Visit: Payer: Self-pay

## 2021-01-20 ENCOUNTER — Encounter: Payer: Self-pay | Admitting: Registered Nurse

## 2021-01-20 ENCOUNTER — Telehealth (INDEPENDENT_AMBULATORY_CARE_PROVIDER_SITE_OTHER): Payer: Medicare HMO | Admitting: Registered Nurse

## 2021-01-20 ENCOUNTER — Ambulatory Visit: Payer: Medicare HMO | Admitting: Dermatology

## 2021-01-20 DIAGNOSIS — J329 Chronic sinusitis, unspecified: Secondary | ICD-10-CM | POA: Diagnosis not present

## 2021-01-20 MED ORDER — DOXYCYCLINE HYCLATE 100 MG PO TABS: 100.0000 mg | ORAL_TABLET | Freq: Two times a day (BID) | ORAL | 0 refills | Status: DC

## 2021-01-20 NOTE — Patient Instructions (Signed)
° ° ° °  If you have lab work done today you will be contacted with your lab results within the next 2 weeks.  If you have not heard from us then please contact us. The fastest way to get your results is to register for My Chart. ° ° °IF you received an x-ray today, you will receive an invoice from [facility]. Please contact [facility] at [phone] with questions or concerns regarding your invoice.  ° °IF you received labwork today, you will receive an invoice from [facility]. Please contact [facility] at [phone] with questions or concerns regarding your invoice.  ° °Our billing staff will not be able to assist you with questions regarding bills from these companies. ° °You will be contacted with the lab results as soon as they are available. The fastest way to get your results is to activate your My Chart account. Instructions are located on the last page of this paperwork. If you have not heard from us regarding the results in 2 weeks, please contact this office. °  ° ° ° °

## 2021-01-20 NOTE — Progress Notes (Signed)
Telemedicine Encounter- SOAP NOTE Established Patient  This telephone encounter was conducted with the patient's (or proxy's) verbal consent via audio telecommunications: yes/no: Yes Patient was instructed to have this encounter in a suitably private space; and to only have persons present to whom they give permission to participate. In addition, patient identity was confirmed by use of name plus two identifiers (DOB and address).  I discussed the limitations, risks, security and privacy concerns of performing an evaluation and management service by telephone and the availability of in person appointments. I also discussed with the patient that there may be a patient responsible charge related to this service. The patient expressed understanding and agreed to proceed.  I spent a total of 14 minutes talking with the patient or their proxy.  Patient at home Provider in office  Participants: Jari Sportsman, NP and [name]  Chief Complaint  Patient presents with   Sinusitis    Patient states he is still having some sinus issues and has already finish the previous antibiotic.    Subjective   Jonathon Cain is a 60 y.o. established patient. Telephone visit today for sinusitis  HPI Symptoms onset over 2 weeks ago. Seen by Sandford Craze, NP on 01/08/21 who gave augmentin po bid x 10 days. Suggested flonase, zyrtec, and mucinex as well. He did all of these.  Vertiginous symptoms have resolved.  Ongoing sinus symptoms with pressure, drainage, pnd/cough, and sore throat.  Does note intermittent chills and night sweats - but these have been stable.  Did have diarrhea x 1 but notes that this may not be related to this illness.   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   Leukocytosis 10/30/2016   Peripheral vision loss, bilateral 03/05/2016   Retinitis pigmentosa 12/11/2015   Acquired night blindness 08/08/2015   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   Epiretinal membrane (ERM) of both eyes 08/08/2015   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   Peripheral visual field defect of both eyes 08/08/2015   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   Posterior vitreous detachment of both eyes 08/08/2015   Pseudophakia of both eyes 08/08/2015    Past Medical History:  Diagnosis Date   Allergy    Basal cell carcinoma 09/03/2020   sup & nod- Left upper arm-posterior ([id number])   Bladder outlet obstruction    Cancer (HCC)    Skin   CHF (congestive heart failure) (HCC)    Coronary artery disease    Hyperlipidemia    Hypertension    Melanoma (HCC) 09/03/2020   in situ- Left upper back (EXC)   Retinitis pigmentosa     Current Outpatient Medications  Medication Sig Dispense Refill   amoxicillin-clavulanate (AUGMENTIN) 875-125 MG tablet Take 1 tablet by mouth 2 (two) times daily. 20 tablet 0   atorvastatin (LIPITOR) 80 MG tablet TAKE 1 TABLET(80 MG) BY MOUTH DAILY 90 tablet 0   cetirizine (ZYRTEC) 10 MG tablet  Take 10 mg by mouth daily as needed for allergies.     clopidogrel (PLAVIX) 75 MG tablet Take 1 tablet (75 mg total) by mouth daily. 90 tablet 0   ENTRESTO 49-51 MG TAKE ONE TABLET BY MOUTH every morning & TAKE ONE TABLET BY MOUTH AT bedtime 60 tablet 1   fluticasone (FLONASE) 50 MCG/ACT nasal spray Place 2 sprays into both nostrils daily as  needed for allergies or rhinitis.      furosemide (LASIX) 40 MG tablet Take 1 tablet (40 mg total) by mouth as needed. Take only if you have more than usual leg swelling 30 tablet 1   metoprolol succinate (TOPROL-XL) 25 MG 24 hr tablet Take 0.5 tablets (12.5 mg total) by mouth every other day. 45 tablet 2   spironolactone (ALDACTONE) 25 MG tablet TAKE 1 TABLET(25 MG) BY MOUTH DAILY 90 tablet 1   tamsulosin (FLOMAX) 0.4 MG CAPS capsule Take 1 capsule (0.4 mg total) by mouth daily as needed (urine flow). 90 capsule 1   XARELTO 2.5 MG TABS tablet TAKE 1 TABLET(2.5 MG) BY MOUTH TWICE DAILY 180 tablet 1   COVID-19 At Home Antigen Test (COVID-19 RAPID SELF TEST KIT) KIT Use as directed. 2 each 0   No current facility-administered medications for this visit.    No Known Allergies  Social History   Socioeconomic History   Marital status: Single    Spouse name: Not on file   Number of children: 0   Years of education: 16   Highest education level: Bachelor's degree (e.g., BA, AB, BS)  Occupational History   Not on file  Tobacco Use   Smoking status: Former    Packs/day: 1.50    Years: 40.00    Pack years: 60.00    Types: Cigarettes    Quit date: 07/26/2019    Years since quitting: 1.4   Smokeless tobacco: Never  Vaping Use   Vaping Use: Never used  Substance and Sexual Activity   Alcohol use: Yes    Comment: occasional   Drug use: No   Sexual activity: Not Currently  Other Topics Concern   Not on file  Social History Narrative   Not on file   Social Determinants of Health   Financial Resource Strain: Not on file  Food Insecurity: Not on file  Transportation Needs: Not on file  Physical Activity: Not on file  Stress: Not on file  Social Connections: Not on file  Intimate Partner Violence: Not on file    Review of Systems  Constitutional: Negative.   HENT:  Positive for congestion and sinus pain. Negative for ear discharge, ear pain, hearing loss, nosebleeds, sore throat  and tinnitus.   Eyes: Negative.   Respiratory: Negative.  Negative for stridor.   Cardiovascular: Negative.   Gastrointestinal: Negative.   Genitourinary: Negative.   Musculoskeletal: Negative.   Skin: Negative.   Neurological: Negative.   Endo/Heme/Allergies: Negative.   Psychiatric/Behavioral: Negative.    All other systems reviewed and are negative.  Objective   Vitals as reported by the patient: There were no vitals filed for this visit.  There are no diagnoses linked to this encounter.  PLAN Doxycycline $RemoveBeforeDE'100mg'[id number]$  po bid x 7 days Continue symptom management Return if worsening or failing ot improve. Consider ENT referral Patient encouraged to call clinic with any questions, comments, or concerns.  I discussed the assessment and treatment plan with the patient. The patient was provided an opportunity to ask questions and all were answered. The patient  agreed with the plan and demonstrated an understanding of the instructions.   The patient was advised to call back or seek an in-person evaluation if the symptoms worsen or if the condition fails to improve as anticipated.  I provided 14 minutes of non-face-to-face time during this encounter.  Maximiano Coss, NP

## 2021-01-29 ENCOUNTER — Telehealth: Payer: Self-pay

## 2021-01-29 NOTE — Progress Notes (Addendum)
    Chronic Care Management Pharmacy Assistant   Name: Jonathon Cain  MRN: [id number] DOB: Jul 12, 1960   Reason for Encounter: Medication Review Reviewed chart for medication changes ahead of medication coordination call.   BP Readings from Last 3 Encounters:  12/03/20 114/61  11/20/20 (!) 101/50  11/04/20 (!) 114/59    Lab Results  Component Value Date   HGBA1C 5.5 07/26/2019     Patient obtains medications through [facility]    Patient did not declined (meds) last month due to PRN use/additional supply on hand. Explanation of abundance on hand (ie #30 due to overlapping fills or previous adherence issues etc) Patient was unable to be contacted x3 to dicuss delivery  Patient is due for next adherence delivery on: 01/30/2021 Called patient and reviewed medications and coordinated delivery. Unable to contact  This delivery to include: Entresto 49-21m, Cetirizine 19mPatient will not  need a short fill of (med), prior to adherence delivery. (To align with sync date or if PRN med)  Coordinated acute fill for (med) to be delivered (date).n/a  Patient declined the following medications (meds) due to (reason) n/a  Patient  does needs refills   Medications: Outpatient Encounter Medications as of 01/29/2021  Medication Sig   amoxicillin-clavulanate (AUGMENTIN) 875-125 MG tablet Take 1 tablet by mouth 2 (two) times daily.   atorvastatin (LIPITOR) 80 MG tablet TAKE 1 TABLET(80 MG) BY MOUTH DAILY   cetirizine (ZYRTEC) 10 MG tablet Take 10 mg by mouth daily as needed for allergies.   clopidogrel (PLAVIX) 75 MG tablet Take 1 tablet (75 mg total) by mouth daily.   COVID-19 At Home Antigen Test (COVID-19 RAPID SELF TEST KIT) KIT Use as directed.   doxycycline (VIBRA-TABS) 100 MG tablet Take 1 tablet (100 mg total) by mouth 2 (two) times daily.   ENTRESTO 49-51 MG TAKE ONE TABLET BY MOUTH every morning & TAKE ONE TABLET BY MOUTH AT bedtime   fluticasone (FLONASE) 50 MCG/ACT nasal spray  Place 2 sprays into both nostrils daily as needed for allergies or rhinitis.    furosemide (LASIX) 40 MG tablet Take 1 tablet (40 mg total) by mouth as needed. Take only if you have more than usual leg swelling   metoprolol succinate (TOPROL-XL) 25 MG 24 hr tablet Take 0.5 tablets (12.5 mg total) by mouth every other day.   spironolactone (ALDACTONE) 25 MG tablet TAKE 1 TABLET(25 MG) BY MOUTH DAILY   tamsulosin (FLOMAX) 0.4 MG CAPS capsule Take 1 capsule (0.4 mg total) by mouth daily as needed (urine flow).   XARELTO 2.5 MG TABS tablet TAKE 1 TABLET(2.5 MG) BY MOUTH TWICE DAILY   No facility-administered encounter medications on file as of 01/29/2021.   Shontae Stokes, CMA (PTM)

## 2021-02-02 ENCOUNTER — Encounter: Payer: Self-pay | Admitting: Dermatology

## 2021-02-02 NOTE — Progress Notes (Signed)
   Follow-Up Visit   Subjective  Jonathon Cain is a 60 y.o. male who presents for the following: Procedure (Here to have left upper arm-posterior  treated- bcc x 1).  BCC left arm Location:  Duration:  Quality:  Associated Signs/Symptoms: Modifying Factors:  Severity:  Timing: Context:   Objective  Well appearing patient in no apparent distress; mood and affect are within normal limits. Left Upper Arm - Posterior Biopsy site identified by nurse and me    A focused examination was performed including head, neck, arms. Relevant physical exam findings are noted in the Assessment and Plan.   Assessment & Plan    BCC (basal cell carcinoma), arm, left Left Upper Arm - Posterior  Destruction of lesion Complexity: simple   Destruction method: electrodesiccation and curettage   Informed consent: discussed and consent obtained   Timeout:  patient name, date of birth, surgical site, and procedure verified Anesthesia: the lesion was anesthetized in a standard fashion   Anesthetic:  1% lidocaine w/ epinephrine 1-100,000 local infiltration Curettage performed in three different directions: Yes   Electrodesiccation performed over the curetted area: Yes   Curettage cycles:  3 Lesion length (cm):  1.3 Lesion width (cm):  1.3 Margin per side (cm):  0 Final wound size (cm):  1.3 Hemostasis achieved with:  aluminum chloride and ferric subsulfate Outcome: patient tolerated procedure well with no complications   Post-procedure details: wound care instructions given   Additional details:  Wound innoculated with 5 fluorouracil solution.      I, Lavonna Monarch, MD, have reviewed all documentation for this visit.  The documentation on 02/02/21 for the exam, diagnosis, procedures, and orders are all accurate and complete.

## 2021-02-19 ENCOUNTER — Ambulatory Visit: Payer: Medicare HMO | Admitting: Cardiology

## 2021-02-19 NOTE — Progress Notes (Signed)
Please call patient / send result letter and let them  know their  low dose Ct was read as a Lung RADS 2: nodules that are benign in appearance and behavior with a very low likelihood of becoming a clinically active cancer due to size or lack of growth. Recommendation per radiology is for a repeat LDCT in 12 months. .Please let them  know we will order and schedule their  annual screening scan for 12/2021. Please let them  know there was notation of CAD on their  scan.  Please remind the patient  that this is a non-gated exam therefore degree or severity of disease  cannot be determined. Please have them  follow up with their PCP regarding potential risk factor modification, dietary therapy or pharmacologic therapy if clinically indicated. Pt.  is  currently on statin therapy. Please place order for annual  screening scan for  12/2021 and fax results to PCP. Thanks so much.  Also let patient know there was notation of a small right pleural effusion that is not a new finding. Please have him follow up with his PCP or cardiology for any further evaluation of this problem. Thanks so much

## 2021-02-20 ENCOUNTER — Other Ambulatory Visit: Payer: Self-pay

## 2021-02-20 ENCOUNTER — Ambulatory Visit (INDEPENDENT_AMBULATORY_CARE_PROVIDER_SITE_OTHER): Payer: Medicare HMO

## 2021-02-20 DIAGNOSIS — Z0000 Encounter for general adult medical examination without abnormal findings: Secondary | ICD-10-CM

## 2021-02-20 DIAGNOSIS — Z87891 Personal history of nicotine dependence: Secondary | ICD-10-CM

## 2021-02-20 NOTE — Patient Instructions (Signed)
Jonathon Cain , Thank you for taking time to come for your Medicare Wellness Visit. I appreciate your ongoing commitment to your health goals. Please review the following plan we discussed and let me know if I can assist you in the future.   Screening recommendations/referrals: Colonoscopy: 06/29/2018 Recommended yearly ophthalmology/optometry visit for glaucoma screening and checkup Recommended yearly dental visit for hygiene and checkup  Vaccinations: Influenza vaccine: completed  Pneumococcal vaccine: completed  Tdap vaccine: due  Shingles vaccine: will consider     Advanced directives: none   Conditions/risks identified: none   Next appointment: none   Preventive Care 38 Years and Older, Male Preventive care refers to lifestyle choices and visits with your health care provider that can promote health and wellness. What does preventive care include? A yearly physical exam. This is also called an annual well check. Dental exams once or twice a year. Routine eye exams. Ask your health care provider how often you should have your eyes checked. Personal lifestyle choices, including: Daily care of your teeth and gums. Regular physical activity. Eating a healthy diet. Avoiding tobacco and drug use. Limiting alcohol use. Practicing safe sex. Taking low doses of aspirin every day. Taking vitamin and mineral supplements as recommended by your health care provider. What happens during an annual well check? The services and screenings done by your health care provider during your annual well check will depend on your age, overall health, lifestyle risk factors, and family history of disease. Counseling  Your health care provider may ask you questions about your: Alcohol use. Tobacco use. Drug use. Emotional well-being. Home and relationship well-being. Sexual activity. Eating habits. History of falls. Memory and ability to understand (cognition). Work and work  Statistician. Screening  You may have the following tests or measurements: Height, weight, and BMI. Blood pressure. Lipid and cholesterol levels. These may be checked every 5 years, or more frequently if you are over 85 years old. Skin check. Lung cancer screening. You may have this screening every year starting at age 56 if you have a 30-pack-year history of smoking and currently smoke or have quit within the past 15 years. Fecal occult blood test (FOBT) of the stool. You may have this test every year starting at age 65. Flexible sigmoidoscopy or colonoscopy. You may have a sigmoidoscopy every 5 years or a colonoscopy every 10 years starting at age 64. Prostate cancer screening. Recommendations will vary depending on your family history and other risks. Hepatitis C blood test. Hepatitis B blood test. Sexually transmitted disease (STD) testing. Diabetes screening. This is done by checking your blood sugar (glucose) after you have not eaten for a while (fasting). You may have this done every 1-3 years. Abdominal aortic aneurysm (AAA) screening. You may need this if you are a current or former smoker. Osteoporosis. You may be screened starting at age 50 if you are at high risk. Talk with your health care provider about your test results, treatment options, and if necessary, the need for more tests. Vaccines  Your health care provider may recommend certain vaccines, such as: Influenza vaccine. This is recommended every year. Tetanus, diphtheria, and acellular pertussis (Tdap, Td) vaccine. You may need a Td booster every 10 years. Zoster vaccine. You may need this after age 23. Pneumococcal 13-valent conjugate (PCV13) vaccine. One dose is recommended after age 9. Pneumococcal polysaccharide (PPSV23) vaccine. One dose is recommended after age 70. Talk to your health care provider about which screenings and vaccines you need and how often  you need them. This information is not intended to replace  advice given to you by your health care provider. Make sure you discuss any questions you have with your health care provider. Document Released: 04/05/2015 Document Revised: 11/27/2015 Document Reviewed: 01/08/2015 Elsevier Interactive Patient Education  2017 [place] Prevention in the Home Falls can cause injuries. They can happen to people of all ages. There are many things you can do to make your home safe and to help prevent falls. What can I do on the outside of my home? Regularly fix the edges of walkways and driveways and fix any cracks. Remove anything that might make you trip as you walk through a door, such as a raised step or threshold. Trim any bushes or trees on the path to your home. Use bright outdoor lighting. Clear any walking paths of anything that might make someone trip, such as rocks or tools. Regularly check to see if handrails are loose or broken. Make sure that both sides of any steps have handrails. Any raised decks and porches should have guardrails on the edges. Have any leaves, snow, or ice cleared regularly. Use sand or salt on walking paths during winter. Clean up any spills in your garage right away. This includes oil or grease spills. What can I do in the bathroom? Use night lights. Install grab bars by the toilet and in the tub and shower. Do not use towel bars as grab bars. Use non-skid mats or decals in the tub or shower. If you need to sit down in the shower, use a plastic, non-slip stool. Keep the floor dry. Clean up any water that spills on the floor as soon as it happens. Remove soap buildup in the tub or shower regularly. Attach bath mats securely with double-sided non-slip rug tape. Do not have throw rugs and other things on the floor that can make you trip. What can I do in the bedroom? Use night lights. Make sure that you have a light by your bed that is easy to reach. Do not use any sheets or blankets that are too big for your bed.  They should not hang down onto the floor. Have a firm chair that has side arms. You can use this for support while you get dressed. Do not have throw rugs and other things on the floor that can make you trip. What can I do in the kitchen? Clean up any spills right away. Avoid walking on wet floors. Keep items that you use a lot in easy-to-reach places. If you need to reach something above you, use a strong step stool that has a grab bar. Keep electrical cords out of the way. Do not use floor polish or wax that makes floors slippery. If you must use wax, use non-skid floor wax. Do not have throw rugs and other things on the floor that can make you trip. What can I do with my stairs? Do not leave any items on the stairs. Make sure that there are handrails on both sides of the stairs and use them. Fix handrails that are broken or loose. Make sure that handrails are as long as the stairways. Check any carpeting to make sure that it is firmly attached to the stairs. Fix any carpet that is loose or worn. Avoid having throw rugs at the top or bottom of the stairs. If you do have throw rugs, attach them to the floor with carpet tape. Make sure that you have a light  switch at the top of the stairs and the bottom of the stairs. If you do not have them, ask someone to add them for you. What else can I do to help prevent falls? Wear shoes that: Do not have high heels. Have rubber bottoms. Are comfortable and fit you well. Are closed at the toe. Do not wear sandals. If you use a stepladder: Make sure that it is fully opened. Do not climb a closed stepladder. Make sure that both sides of the stepladder are locked into place. Ask someone to hold it for you, if possible. Clearly mark and make sure that you can see: Any grab bars or handrails. First and last steps. Where the edge of each step is. Use tools that help you move around (mobility aids) if they are needed. These  include: Canes. Walkers. Scooters. Crutches. Turn on the lights when you go into a dark area. Replace any light bulbs as soon as they burn out. Set up your furniture so you have a clear path. Avoid moving your furniture around. If any of your floors are uneven, fix them. If there are any pets around you, be aware of where they are. Review your medicines with your doctor. Some medicines can make you feel dizzy. This can increase your chance of falling. Ask your doctor what other things that you can do to help prevent falls. This information is not intended to replace advice given to you by your health care provider. Make sure you discuss any questions you have with your health care provider. Document Released: 01/03/2009 Document Revised: 08/15/2015 Document Reviewed: 04/13/2014 Elsevier Interactive Patient Education  2017 [employer].

## 2021-02-20 NOTE — Progress Notes (Signed)
Subjective:   Jonathon Cain is a 60 y.o. male who presents for an Initial Medicare Annual Wellness Visit.   I connected with [name] today by telephone and verified that I am speaking with the correct person using two identifiers. Location patient: home Location provider: work Persons participating in the virtual visit: patient, provider.   I discussed the limitations, risks, security and privacy concerns of performing an evaluation and management service by telephone and the availability of in person appointments. I also discussed with the patient that there may be a patient responsible charge related to this service. The patient expressed understanding and verbally consented to this telephonic visit.    Interactive audio and video telecommunications were attempted between this provider and patient, however failed, due to patient having technical difficulties OR patient did not have access to video capability.  We continued and completed visit with audio only.    Review of Systems     Cardiac Risk Factors include: advanced age (>33mn, >>73women);male gender     Objective:    Today's Vitals   There is no height or weight on file to calculate BMI.  Advanced Directives 02/20/2021 05/21/2020 03/19/2020 11/14/2019 10/25/2019 09/19/2019 07/26/2019  Does Patient Have a Medical Advance Directive? No No No Yes No No No  Type of Advance Directive - - -Public librarianLiving will - - -  Does patient want to make changes to medical advance directive? - - - No - Patient declined - - -  [place] of [place]? - - - No - copy requested - - -  Would patient like information on creating a medical advance directive? No - Patient declined No - Patient declined No - Patient declined - - Yes (MAU/Ambulatory/Procedural Areas - Information given) Yes (Inpatient - patient requests chaplain consult to create a medical advance directive)    Current Medications  (verified) Outpatient Encounter Medications as of 02/20/2021  Medication Sig   atorvastatin (LIPITOR) 80 MG tablet TAKE 1 TABLET(80 MG) BY MOUTH DAILY   cetirizine (ZYRTEC) 10 MG tablet Take 10 mg by mouth daily as needed for allergies.   clopidogrel (PLAVIX) 75 MG tablet Take 1 tablet (75 mg total) by mouth daily.   ENTRESTO 49-51 MG TAKE ONE TABLET BY MOUTH every morning & TAKE ONE TABLET BY MOUTH AT bedtime   fluticasone (FLONASE) 50 MCG/ACT nasal spray Place 2 sprays into both nostrils daily as needed for allergies or rhinitis.    furosemide (LASIX) 40 MG tablet Take 1 tablet (40 mg total) by mouth as needed. Take only if you have more than usual leg swelling   metoprolol succinate (TOPROL-XL) 25 MG 24 hr tablet Take 0.5 tablets (12.5 mg total) by mouth every other day.   spironolactone (ALDACTONE) 25 MG tablet TAKE 1 TABLET(25 MG) BY MOUTH DAILY   tamsulosin (FLOMAX) 0.4 MG CAPS capsule Take 1 capsule (0.4 mg total) by mouth daily as needed (urine flow).   XARELTO 2.5 MG TABS tablet TAKE 1 TABLET(2.5 MG) BY MOUTH TWICE DAILY   amoxicillin-clavulanate (AUGMENTIN) 875-125 MG tablet Take 1 tablet by mouth 2 (two) times daily.   COVID-19 At Home Antigen Test (COVID-19 RAPID SELF TEST KIT) KIT Use as directed.   doxycycline (VIBRA-TABS) 100 MG tablet Take 1 tablet (100 mg total) by mouth 2 (two) times daily.   No facility-administered encounter medications on file as of 02/20/2021.    Allergies (verified) Patient has no known allergies.  History: Past Medical History:  Diagnosis Date   Allergy    Basal cell carcinoma 09/03/2020   sup & nod- Left upper arm-posterior ([id number])   Bladder outlet obstruction    Cancer (HCC)    Skin   CHF (congestive heart failure) (HCC)    Coronary artery disease    Hyperlipidemia    Hypertension    Melanoma ([place]) 09/03/2020   in situ- Left upper back (EXC)   Retinitis pigmentosa    Past Surgical History:  Procedure Laterality Date   ABDOMINAL  AORTOGRAM W/LOWER EXTREMITY N/A 03/19/2020   Procedure: ABDOMINAL AORTOGRAM W/LOWER EXTREMITY;  Surgeon: Nigel Mormon, MD;  Location: [place] [facility];  Service: Cardiovascular;  Laterality: N/A;   CARDIAC CATHETERIZATION     CATARACT EXTRACTION Bilateral 2004   CORONARY BALLOON ANGIOPLASTY N/A 11/14/2019   Procedure: CORONARY BALLOON ANGIOPLASTY;  Surgeon: Nigel Mormon, MD;  Location: [place] [facility];  Service: Cardiovascular;  Laterality: N/A;   CORONARY STENT INTERVENTION N/A 07/26/2019   Procedure: CORONARY STENT INTERVENTION;  Surgeon: Adrian Prows, MD;  Location: [place] [facility];  Service: Cardiovascular;  Laterality: N/A;   CORONARY/GRAFT ACUTE MI REVASCULARIZATION N/A 07/26/2019   Procedure: CORONARY/GRAFT ACUTE MI REVASCULARIZATION;  Surgeon: Adrian Prows, MD;  Location: [place] [facility];  Service: Cardiovascular;  Laterality: N/A;   EYE SURGERY     INTRAVASCULAR PRESSURE WIRE/FFR STUDY N/A 11/14/2019   Procedure: INTRAVASCULAR PRESSURE WIRE/FFR STUDY;  Surgeon: Nigel Mormon, MD;  Location: [place] [facility];  Service: Cardiovascular;  Laterality: N/A;   INTRAVASCULAR ULTRASOUND/IVUS N/A 11/14/2019   Procedure: Intravascular Ultrasound/IVUS;  Surgeon: Nigel Mormon, MD;  Location: [place] [facility];  Service: Cardiovascular;  Laterality: N/A;   LEFT HEART CATH AND CORONARY ANGIOGRAPHY N/A 07/26/2019   Procedure: LEFT HEART CATH AND CORONARY ANGIOGRAPHY;  Surgeon: Adrian Prows, MD;  Location: [place] [facility];  Service: Cardiovascular;  Laterality: N/A;   LOWER EXTREMITY ANGIOGRAPHY Left 05/21/2020   Procedure: LOWER EXTREMITY ANGIOGRAPHY;  Surgeon: Nigel Mormon, MD;  Location: [place] [facility];  Service: Cardiovascular;  Laterality: Left;   PERIPHERAL VASCULAR BALLOON ANGIOPLASTY Right 03/19/2020   Procedure: PERIPHERAL VASCULAR BALLOON ANGIOPLASTY;  Surgeon: Nigel Mormon, MD;  Location: [place] [facility];  Service: Cardiovascular;   Laterality: Right;  SFA   PERIPHERAL VASCULAR BALLOON ANGIOPLASTY  05/21/2020   Procedure: PERIPHERAL VASCULAR BALLOON ANGIOPLASTY;  Surgeon: Nigel Mormon, MD;  Location: [place] [facility];  Service: Cardiovascular;;  left SFA   PERIPHERAL VASCULAR INTERVENTION Bilateral 03/19/2020   Procedure: PERIPHERAL VASCULAR INTERVENTION;  Surgeon: Nigel Mormon, MD;  Location: [place] [facility];  Service: Cardiovascular;  Laterality: Bilateral;  External Iliac   TUMOR REMOVAL  1961   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   Family History  Problem Relation Age of Onset   Heart disease Mother    Hyperlipidemia Mother    Asthma Mother    Heart disease Father    Hypertension Father    Heart disease Maternal Grandmother    Heart disease Maternal Grandfather    Heart disease Paternal Grandmother    Heart disease Paternal Grandfather    Social History   Socioeconomic History   Marital status: Single    Spouse name: Not on file   Number of children: 0   Years of education: 16   Highest education level: Bachelor's degree (e.g., BA, AB, BS)  Occupational History   Not on file  Tobacco Use  Smoking status: Former    Packs/day: 1.50    Years: 40.00    Pack years: 60.00    Types: Cigarettes    Quit date: 07/26/2019    Years since quitting: 1.5   Smokeless tobacco: Never  Vaping Use   Vaping Use: Never used  Substance and Sexual Activity   Alcohol use: Yes    Comment: occasional   Drug use: No   Sexual activity: Not Currently  Other Topics Concern   Not on file  Social History Narrative   Not on file   Social Determinants of Health   Financial Resource Strain: Low Risk    Difficulty of Paying Living Expenses: Not hard at all  Food Insecurity: No Food Insecurity   Worried About Charity fundraiser in the Last Year: Never true   [place] in the Last Year: Never true  Transportation Needs: No Transportation Needs   Lack of Transportation (Medical): No   Lack of  Transportation (Non-Medical): No  Physical Activity: Inactive   Days of Exercise per Week: 0 days   Minutes of Exercise per Session: 0 min  Stress: No Stress Concern Present   Feeling of Stress : Not at all  Social Connections: Socially Isolated   Frequency of Communication with Friends and Family: Twice a week   Frequency of Social Gatherings with Friends and Family: Twice a week   Attends Religious Services: Never   Printmaker: No   Attends Music therapist: Never   Marital Status: Never married    Tobacco Counseling Counseling given: Not Answered   Clinical Intake:  Pre-visit preparation completed: Yes  Pain : No/denies pain     Nutritional Risks: None Diabetes: No  How often do you need to have someone help you when you read instructions, pamphlets, or other written materials from your doctor or pharmacy?: 1 - Never What is the 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 N  Vision? N  Difficulty concentrating or making decisions? N  Walking or climbing stairs? N  Dressing or bathing? N  Doing errands, shopping? N  Preparing Food and eating ? N  Using the Toilet? N  In the past six months, have you accidently leaked urine? N  Do you have problems with loss of bowel control? N  Managing your Medications? N  Managing your Finances? N  Housekeeping or managing your Housekeeping? N  Some recent data might be hidden    Patient Care Team: Maximiano Coss, NP as PCP - General (Adult Health Nurse Practitioner) Nigel Mormon, MD as Consulting Physician (Cardiology) Peter Congo, MD as Referring Physician (Ophthalmology) Lavonna Monarch, MD as Consulting Physician  (Dermatology) Madelin Rear, [facility] as Pharmacist (Pharmacist)  Indicate any recent Medical Services you may have received from other than [facility] providers in the past year (date may be approximate).     Assessment:   This is a routine wellness examination for Almon.  Hearing/Vision screen Vision Screening - Comments:: Annual eye exams   Dietary issues and exercise activities discussed: Current Exercise Habits: The patient does not participate in regular exercise at present, Exercise limited by: Other - see comments (blindness)   Goals Addressed   None    Depression Screen [facility] 2/9  Scores 02/20/2021 02/20/2021 01/20/2021 12/03/2020 07/02/2020 12/21/2019 09/19/2019  PHQ - 2 Score 0 0 0 2 0 0 3  PHQ- 9 Score - - 0 12 - - 9    Fall Risk Fall Risk  02/20/2021 01/20/2021 12/03/2020 07/02/2020 07/02/2020  Falls in the past year? 0 0 0 0 0  Number falls in past yr: 0 0 0 - -  Injury with Fall? 0 0 0 - -  Risk for fall due to : - No Fall Risks - - -  Follow up Falls evaluation completed Falls evaluation completed Falls evaluation completed - Falls evaluation completed    FALL RISK PREVENTION PERTAINING TO THE HOME:  Any stairs in or around the home? No  If so, are there any without handrails? No  Home free of loose throw rugs in walkways, pet beds, electrical cords, etc? Yes  Adequate lighting in your home to reduce risk of falls? Yes   ASSISTIVE DEVICES UTILIZED TO PREVENT FALLS:  Life alert? No  Use of a cane, walker or w/c? No  Grab bars in the bathroom? No  Shower chair or bench in shower? No  Elevated toilet seat or a handicapped toilet? No    Cognitive Function:  Normal cognitive status assessed by direct observation by this Nurse Health Advisor. No abnormalities found.        Immunizations Immunization History  Administered Date(s) Administered   Influenza,inj,Quad PF,6+ Mos 12/03/2020   PFIZER(Purple Top)SARS-COV-2 Vaccination 12/14/2019, 01/04/2020   PNEUMOCOCCAL  CONJUGATE-20 12/03/2020    TDAP status: Due, Education has been provided regarding the importance of this vaccine. Advised may receive this vaccine at local pharmacy or Health Dept. Aware to provide a copy of the vaccination record if obtained from local pharmacy or Health Dept. Verbalized acceptance and understanding.  Flu Vaccine status: Up to date  Pneumococcal vaccine status: Up to date  Covid-19 vaccine status: Completed vaccines  Qualifies for Shingles Vaccine? Yes   Zostavax completed No   Shingrix Completed?: No.    Education has been provided regarding the importance of this vaccine. Patient has been advised to call insurance company to determine out of pocket expense if they have not yet received this vaccine. Advised may also receive vaccine at local pharmacy or Health Dept. Verbalized acceptance and understanding.  Screening Tests Health Maintenance  Topic Date Due   COVID-19 Vaccine (3 - Pfizer risk series) 02/01/2020   Zoster Vaccines- Shingrix (1 of 2) 03/04/2021 (Originally 09/14/1979)   TETANUS/TDAP  07/02/2021 (Originally 09/14/1979)   Hepatitis C Screening  07/02/2021 (Originally 09/14/1976)   HIV Screening  07/02/2021 (Originally 09/14/1973)   COLONOSCOPY (Pts 45-55yr Insurance coverage will need to be confirmed)  06/28/2028   Pneumococcal Vaccine 17164Years old  Completed   INFLUENZA VACCINE  Completed   HPV [place]aintenance Due  Topic Date Due   COVID-19 Vaccine (3 - [place]an Marcosrisk series) 02/01/2020    Colorectal cancer screening: Type of screening: Colonoscopy. Completed 06/29/2018. Repeat every 10 years  Lung Cancer Screening: (Low Dose CT Chest recommended if Age 62-80years, 30 pack-year currently smoking OR have quit w/in 15years.) does not qualify.   Lung Cancer Screening Referral: n/a  Additional Screening:  Hepatitis C Screening: does not qualify;   Vision Screening: Recommended annual ophthalmology  exams for early detection of glaucoma and other disorders of the eye. Is the patient up to date with their annual eye exam?  Yes  Who is  the provider or what is the name of the office in which the patient attends annual eye exams? Dr. Oren Beckmann  If pt is not established with a provider, would they like to be referred to a provider to establish care? No .   Dental Screening: Recommended annual dental exams for proper oral hygiene  Community Resource Referral / Chronic Care Management: CRR required this visit?  No   CCM required this visit?  No      Plan:     I have personally reviewed and noted the following in the patient's chart:   Medical and social history Use of alcohol, tobacco or illicit drugs  Current medications and supplements including opioid prescriptions. Patient is not currently taking opioid prescriptions. Functional ability and status Nutritional status Physical activity Advanced directives List of other physicians Hospitalizations, surgeries, and ER visits in previous 12 months Vitals Screenings to include cognitive, depression, and falls Referrals and appointments  In addition, I have reviewed and discussed with patient certain preventive protocols, quality metrics, and best practice recommendations. A written personalized care plan for preventive services as well as general preventive health recommendations were provided to patient.     Randel Pigg, LPN   [id number]   Nurse Notes: none

## 2021-02-21 ENCOUNTER — Encounter: Payer: Self-pay | Admitting: Cardiology

## 2021-02-21 ENCOUNTER — Ambulatory Visit: Payer: Medicare HMO | Admitting: Cardiology

## 2021-02-21 VITALS — BP 115/71 | HR 67 | Temp 98.0°F | Resp 16 | Ht 70.0 in | Wt 280.0 lb

## 2021-02-21 DIAGNOSIS — I2510 Atherosclerotic heart disease of native coronary artery without angina pectoris: Secondary | ICD-10-CM

## 2021-02-21 DIAGNOSIS — I5020 Unspecified systolic (congestive) heart failure: Secondary | ICD-10-CM

## 2021-02-21 DIAGNOSIS — E782 Mixed hyperlipidemia: Secondary | ICD-10-CM

## 2021-02-21 NOTE — Progress Notes (Signed)
Patient referred by Janeece Agee, NP for CAD  Subjective:   Jonathon Cain, male    DOB: 1960-08-23, 60 y.o.   MRN: [id number]   Chief Complaint  Patient presents with   HFrEF   Follow-up    3 month     HPI  60 y.o. 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  He has no over exertional dyspnea. He had bronchitis and diarrhea in October, now resolved. He is not taking Farxiga due to itching.   Current Outpatient Medications on File Prior to Visit  Medication Sig Dispense Refill   atorvastatin (LIPITOR) 80 MG tablet TAKE 1 TABLET(80 MG) BY MOUTH DAILY 90 tablet 0   cetirizine (ZYRTEC) 10 MG tablet Take 10 mg by mouth daily as needed for allergies.     clopidogrel (PLAVIX) 75 MG tablet Take 1 tablet (75 mg total) by mouth daily. 90 tablet 0   ENTRESTO 49-51 MG TAKE ONE TABLET BY MOUTH every morning & TAKE ONE TABLET BY MOUTH AT bedtime 60 tablet 1   fluticasone (FLONASE) 50 MCG/ACT nasal spray Place 2 sprays into both nostrils daily as needed for allergies or rhinitis.      furosemide (LASIX) 40 MG tablet Take 1 tablet (40 mg total) by mouth as needed. Take only if you have more than usual leg swelling 30 tablet 1   metoprolol succinate (TOPROL-XL) 25 MG 24 hr tablet Take 0.5 tablets (12.5 mg total) by mouth every other day. 45 tablet 2   spironolactone (ALDACTONE) 25 MG tablet TAKE 1 TABLET(25 MG) BY MOUTH DAILY 90 tablet 1   tamsulosin (FLOMAX) 0.4 MG CAPS capsule Take 1 capsule (0.4 mg total) by mouth daily as needed (urine flow). 90 capsule 1   XARELTO 2.5 MG TABS tablet TAKE 1 TABLET(2.5 MG) BY MOUTH TWICE DAILY 180 tablet 1   No current  facility-administered medications on file prior to visit.    Cardiovascular and other pertinent studies:  EKG 02/21/2021: Sinus rhythm 55 bpm Low voltage in precordial leads  Left anterior fascicular block  Nonspecific T-abnormality  Lexiscan Tetrofosmin stress test 09/18/2020: Lexiscan nuclear stress test performed using 1-day protocol. Patient experienced dyspnea and chest discomfort SPECT images showed large sized, moderate intensity, fixed perfusion defect in mid to apical, anterior, inferior, and apical myocardium. There is severe decrease in myocardial thickening and wall motion. Stress LVEF 12%. High risk study.  Echocardiogram 09/18/2020:  Left ventricle cavity is moderately dilated. Normal left ventricular wall  thickness. Moderate global hypokinesis with apical dyskinesis.  LV  encocardial poorly visualized. LVEF probably around 35%.  Doppler evidence  of grade III (restricted) diastolic dysfunction, normal LAP.  Left atrial cavity is mildly dilated.  Moderate (Grade II) mitral regurgitation.  Mild tricuspid regurgitation.  No evidence of pulmonary hypertension.  No significant change compared to previous study on 10/24/2019.  PV intervention 05/21/2020: Left SFA CTO Successful retrograde revascularization with 5.0X150 mm Ranger DCB  ABI 04/17/2020:  This exam reveals normal perfusion of the right lower extremity (ABI  1.00). There is mildly abnormal biphasic waveform at the right ankle.  This exam reveals moderately decreased perfusion of the left lower  extremity, noted at the post tibial artery level (ABI 0.75).  Severely  abnormal monophasic waveform at the left ankle.  Compared to 03/07/2020, right ABI 0.75 and left ABI 0.66.  Peripheral intervention 03/19/2020: Rt CIA 80% stenosis Lt CIA 95% stenosis Rt SFA short segment of CTO 3 vessel below the knee runoff Lt SFA short segment of CTO Occluded Lt ATA with 2 vessel below the knee runoff   PTA Rt SFA 5.0X80 mm  In.Pact drug coated balloon PTA and stenting Rt CIA 9.0X40 mm Eluvia drug eluting stent PTA and stenting Rt CIA 10.0X40 mm Eluvia drug eluting stent   Will stage Lt SFA revascularization, if clinically indicated  Cardiac MRI 02/01/2020: 1. Subendocardial late gadolinium enhancement consistent with prior infarct in LAD territory. There is >50% transmural LGE suggesting nonviability in mid anterior/anteroseptal walls, apical anterior/septal/inferior walls, and apex. 2. Mild LV dilatation with moderate systolic function (EF 62%). Akinesis of mid to apical anterior, mid anteroseptal, apical septal, apical inferior, and apex. 3.  Normal RV size and systolic function (EF 03%) 4.  Moderate mitral regurgitation (regurgitant fraction 24%) 5.  Moderate right pleural effusion   Coronary angiography 11/14/2019: LM: Diffuse 30% disease. IVUS MLA 7.3 mm2,. dFR 1.0 LAD: Patent prox LAD stent with udner expansion        Optimization with 3.5X15 mm Hunter balloon at 22 atm        Excellent expansion as seen on IVUS        TIMI flow II-->III        Diag: Mod diffuse disease LCx: 80% ostial OM1 stenosis. dFR 0.98 RCA: Not engaged today  Left Heart Catheterization 07/26/19 (Dr. Einar Gip):  LM: Mild diffuse disease LAD: Very large caliber vessel, occluded at the proximal end.  Gives origin to high D1 which is large.  Successful PTCA and stenting of the proximal LAD overlapping D1, 3.5 x 24 mm Synergy DES deployed at 12 atmospheric pressure for 60 S. 100% to 0% with TIMI 0 to TIMI-3 flow. LCx: Large vessel, giving origin to a very large OM1 which is secondary branches.  Ostium of OM1 has 80% stenosis. RCA: Mild diffuse disease, distal RCA 15 to 20% stenosis, PDA 50% stenosis.   Recommendation: Patient will need aggressive risk factor modification, dual antiplatelet therapy with aspirin and Brilinta for a year, will be watched carefully in the intensive care unit for preshock, suspect his EDP is extremely high at  the end of the procedure with excessive contrast use.  Will monitor for any contrast nephropathy as well.  Recent labs: 10/29/2020: Glucose 86, BUN/Cr 12/1.08. EGFR 78. Na/K 141/4.2.  NT pro BNP 733  04/19/2020: Chol 119, TG 75, HDL 40, LDL 64  02/19/2020: Glucose 90, BUN/Cr 21/1.04. EGFR 77. Na/K 136/4.0.  AST/ALT 43/55, AlKP 216 H/H 16/50. MCV 87. Platelets 258   11/02/2019: Glucose 85, BUN/Cr 15/1.18. EGFR 66. Na/K 138/4.7.  H/H 14/46. MCV 91. Platelets 222  08/17/2019: Glucose 90, BUN/Cr 17/1.21. EGFR 65. Na/K 136/4.9.  H/H 14/44. MCV 90. Platelets 289  07/26/2019: Glucose 122, BUN/Cr 9/0.9. EGFR 60. H/H 16.1/49.0. MCV 90.2. Platelets 240 HbA1C 5.5% Chol 230, TG 95, HDL 36, LDL 175   Review of Systems  Cardiovascular:  Negative for chest pain, dyspnea on exertion, leg swelling, palpitations and syncope.  Gastrointestinal:  Positive for diarrhea.        Vitals:   02/21/21 1128  BP: 115/71  Pulse: 67  Resp: 16  Temp: 98 F (36.7 C)  SpO2: 96%      Body mass index is 40.18 kg/m. Filed Weights  02/21/21 1128  Weight: 280 lb (127 kg)       Objective:   Physical Exam Vitals and nursing note reviewed.  Constitutional:      General: He is not in acute distress. Neck:     Vascular: No JVD.  Cardiovascular:     Rate and Rhythm: Normal rate and regular rhythm.     Heart sounds: Normal heart sounds. No murmur heard. Pulmonary:     Effort: Pulmonary effort is normal.     Breath sounds: Normal breath sounds. No wheezing or rales.  Musculoskeletal:     Right lower leg: No edema.     Left lower leg: No edema.         Assessment & Recommendations:   60 y.o. 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 Residual mild below the knee disease Continue Plavix 75 mg daily, Xarelto 2.5 mg bid  CAD: Constant chest pain.  Large area of LAD infarct, but no ischemia on stress test 09/2020.  S/p primary PCI to culprit prox LAD (07/2019) dFR negative 80% stenosis in OM1 (93903) LAD stent optimized under IVUS (10/2019) Continue Plavix 75 mg daily, Xarelto 2.5 mg bid Continue Lipitor 80. LDL down to 64.  Ischemic cardiomyopathy: Stable NYHA class II. NT pro BNP 700. EF has stable around 35% (echocardiogram 08/2020). EF around 35%, MRI noted EF of 36% in 2021. Currently on Entesto to 49-51 mg bid, spironolactone 25 mg daily, metoprolol succinate-prefers to take 12.5 mg every other day. Not taking Farxiga due to itching. Continue Lasix only as needed. I discussed referral for Biostim. He wants to hold off for now.  F/u in 3 months   [place], MD [facility]. [place] Pager: [phone] Office: [phone]

## 2021-02-26 ENCOUNTER — Telehealth: Payer: Self-pay | Admitting: Pharmacist

## 2021-02-26 NOTE — Progress Notes (Addendum)
Chronic Care Management Pharmacy Assistant   Name: Buryl Bamber  MRN: [id number] DOB: 02-Jan-1961   Reason for Encounter: Monthly Medication Coordination Call     Medications: Outpatient Encounter Medications as of 02/26/2021  Medication Sig   atorvastatin (LIPITOR) 80 MG tablet TAKE 1 TABLET(80 MG) BY MOUTH DAILY   cetirizine (ZYRTEC) 10 MG tablet Take 10 mg by mouth daily as needed for allergies.   clopidogrel (PLAVIX) 75 MG tablet Take 1 tablet (75 mg total) by mouth daily.   ENTRESTO 49-51 MG TAKE ONE TABLET BY MOUTH every morning & TAKE ONE TABLET BY MOUTH AT bedtime   fluticasone (FLONASE) 50 MCG/ACT nasal spray Place 2 sprays into both nostrils daily as needed for allergies or rhinitis.    furosemide (LASIX) 40 MG tablet Take 1 tablet (40 mg total) by mouth as needed. Take only if you have more than usual leg swelling   metoprolol succinate (TOPROL-XL) 25 MG 24 hr tablet Take 0.5 tablets (12.5 mg total) by mouth every other day.   spironolactone (ALDACTONE) 25 MG tablet TAKE 1 TABLET(25 MG) BY MOUTH DAILY   tamsulosin (FLOMAX) 0.4 MG CAPS capsule Take 1 capsule (0.4 mg total) by mouth daily as needed (urine flow).   XARELTO 2.5 MG TABS tablet TAKE 1 TABLET(2.5 MG) BY MOUTH TWICE DAILY   No facility-administered encounter medications on file as of 02/26/2021.    Reviewed chart for medication changes ahead of medication coordination call.  No OVs, Consults, or hospital visits since last care coordination call/Pharmacist visit. (If appropriate, list visit date, provider name)  No medication changes indicated OR if recent visit, treatment plan here.  BP Readings from Last 3 Encounters:  02/21/21 115/71  12/03/20 114/61  11/20/20 (!) 101/50    Lab Results  Component Value Date   HGBA1C 5.5 07/26/2019     Patient obtains medications through Packaging 90 days   Last adherence delivery included: (medication name and frequency) Spirolactone 25 mg 1 tablet  daily Entresto 49/51 1 tablet daily Metoprolol ER 25 mg  1/2 tablet every other day Atorvastatin 80 mg 1 tablet daily Clopidogrel 75 mg 1 tablet daily Pantoprazole 40 mg 1 tablet daily with breakfast (D/C' d in June per patient)   Patient is due for next adherence delivery on: 03/11/21. (Pt has appt needs changed to 03/10/21) Called patient and reviewed medications and coordinated delivery.  This delivery to include:  Spirolactone 25 mg 1 tablet daily Entresto 49/51 1 tablet daily Metoprolol ER 25 mg  1/2 tablet every other day Atorvastatin 80 mg 1 tablet daily Clopidogrel 75 mg 1 tablet daily   Xarelto 2.5mg  1 tablet BID (receiving 30 day acute fill pt request per pharmacy today) Cetrizine 10 mg 1 tablet daily    Patient needs refills for: Atorvastatin 80 mg 1 tablet daily and Clopidogrel 75 mg 1 tablet daily for 90 days supply.   Requested delivery date of 03/11/21 to 03/10/21, advised patient that pharmacy will contact them the morning of delivery.  Star Rating Drugs: atorvastatin (LIPITOR) 80 MG tablet - last filled 12/06/20 90 days    Care Gaps  AWV: done 02/20/21 Colonoscopy: due 06/28/28 DM Eye Exam: N/A DM Foot Exam: N/A Microalbumin: N/A HbgAIC: N/A DEXA: N/A  Mammogram: N/A   Future Appointments  Date Time Provider [place]  05/22/2021 11:45 AM Patwardhan, Reynold Bowen, MD [facility] None  01/19/2022  2:30 PM Lavonna Monarch, MD [facility] [place], [place] Clinical Pharmacist Assistant  [phone]  10 minutes spent in review, coordination, and documentation.  Reviewed by: Beverly Milch, PharmD Clinical Pharmacist [phone]

## 2021-03-04 ENCOUNTER — Other Ambulatory Visit: Payer: Self-pay | Admitting: Cardiology

## 2021-03-04 DIAGNOSIS — I739 Peripheral vascular disease, unspecified: Secondary | ICD-10-CM

## 2021-03-04 DIAGNOSIS — I25118 Atherosclerotic heart disease of native coronary artery with other forms of angina pectoris: Secondary | ICD-10-CM

## 2021-03-11 DIAGNOSIS — R6889 Other general symptoms and signs: Secondary | ICD-10-CM | POA: Diagnosis not present

## 2021-03-31 ENCOUNTER — Other Ambulatory Visit: Payer: Self-pay | Admitting: Cardiology

## 2021-03-31 DIAGNOSIS — I25118 Atherosclerotic heart disease of native coronary artery with other forms of angina pectoris: Secondary | ICD-10-CM

## 2021-03-31 DIAGNOSIS — I739 Peripheral vascular disease, unspecified: Secondary | ICD-10-CM

## 2021-04-15 DIAGNOSIS — H3500 Unspecified background retinopathy: Secondary | ICD-10-CM | POA: Diagnosis not present

## 2021-04-15 DIAGNOSIS — H469 Unspecified optic neuritis: Secondary | ICD-10-CM | POA: Diagnosis not present

## 2021-04-15 DIAGNOSIS — R6889 Other general symptoms and signs: Secondary | ICD-10-CM | POA: Diagnosis not present

## 2021-04-15 DIAGNOSIS — R94111 Abnormal electroretinogram [ERG]: Secondary | ICD-10-CM | POA: Diagnosis not present

## 2021-04-15 DIAGNOSIS — H3552 Pigmentary retinal dystrophy: Secondary | ICD-10-CM | POA: Diagnosis not present

## 2021-04-15 DIAGNOSIS — H468 Other optic neuritis: Secondary | ICD-10-CM | POA: Diagnosis not present

## 2021-04-15 DIAGNOSIS — H30123 Disseminated chorioretinal inflammation, peripheral, bilateral: Secondary | ICD-10-CM | POA: Diagnosis not present

## 2021-04-15 DIAGNOSIS — R94112 Abnormal visually evoked potential [VEP]: Secondary | ICD-10-CM | POA: Diagnosis not present

## 2021-04-15 DIAGNOSIS — H53453 Other localized visual field defect, bilateral: Secondary | ICD-10-CM | POA: Diagnosis not present

## 2021-04-15 DIAGNOSIS — H5362 Acquired night blindness: Secondary | ICD-10-CM | POA: Diagnosis not present

## 2021-04-15 DIAGNOSIS — D8989 Other specified disorders involving the immune mechanism, not elsewhere classified: Secondary | ICD-10-CM | POA: Diagnosis not present

## 2021-04-29 ENCOUNTER — Telehealth: Payer: Self-pay | Admitting: Pharmacist

## 2021-04-29 NOTE — Progress Notes (Signed)
° ° °  Chronic Care Management Pharmacy Assistant   Name: Jonathon Cain  MRN: [id number] DOB: 20-Mar-1961  Reason for Encounter: Disease State - General Adherence Call    Recent office visits:  None noted  Recent consult visits:  None noted  Hospital visits:  None in previous 6 months  Medications: Outpatient Encounter Medications as of 04/29/2021  Medication Sig   atorvastatin (LIPITOR) 80 MG tablet TAKE ONE TABLET BY MOUTH EVERYDAY AT BEDTIME   cetirizine (ZYRTEC) 10 MG tablet Take 10 mg by mouth daily as needed for allergies.   clopidogrel (PLAVIX) 75 MG tablet TAKE ONE TABLET BY MOUTH EVERY MORNING   ENTRESTO 49-51 MG TAKE ONE TABLET BY MOUTH every morning & TAKE ONE TABLET BY MOUTH AT bedtime   fluticasone (FLONASE) 50 MCG/ACT nasal spray Place 2 sprays into both nostrils daily as needed for allergies or rhinitis.    furosemide (LASIX) 40 MG tablet Take 1 tablet (40 mg total) by mouth as needed. Take only if you have more than usual leg swelling   metoprolol succinate (TOPROL-XL) 25 MG 24 hr tablet Take 0.5 tablets (12.5 mg total) by mouth every other day.   spironolactone (ALDACTONE) 25 MG tablet TAKE 1 TABLET(25 MG) BY MOUTH DAILY   tamsulosin (FLOMAX) 0.4 MG CAPS capsule Take 1 capsule (0.4 mg total) by mouth daily as needed (urine flow).   XARELTO 2.5 MG TABS tablet TAKE ONE TABLET BY MOUTH every morning and TAKE ONE TABLET BY MOUTH every evening   No facility-administered encounter medications on file as of 04/29/2021.    Have you had any problems recently with your health? Patient reported he has not had any recent concerns with his health recently.  Have you had any problems with your pharmacy? Patient denied having any problems with his pharmacy.   What issues or side effects are you having with your medications? Patient denied having any side effects or issues with his current medications.   What would you like me to pass along to Leata Mouse, CPP for them to help  you with?  Patient did not have anything to pass along to CPP at this time.   What can we do to take care of you better? Patient had no recommendations at this time. Patient thanked me for the call and follow up.   Care Gaps   AWV: done 02/20/21 Colonoscopy: due 06/28/28 DM Eye Exam: N/A DM Foot Exam: N/A Microalbumin: N/A HbgAIC: N/A DEXA: N/A  Mammogram: N/A  Star Rating Drugs: Atorvastatin (LIPITOR) 80 MG tablet - last filled  03/04/21 90 days    Future Appointments  Date Time Provider [place]  05/22/2021 11:45 AM Patwardhan, Reynold Bowen, MD [facility] None  01/19/2022  2:30 PM Lavonna Monarch, MD [facility] [place], [facility] Clinical Pharmacist Assistant  [phone]

## 2021-05-15 ENCOUNTER — Other Ambulatory Visit: Payer: Self-pay

## 2021-05-15 ENCOUNTER — Ambulatory Visit: Payer: Medicare HMO | Admitting: Podiatry

## 2021-05-15 ENCOUNTER — Encounter: Payer: Self-pay | Admitting: Podiatry

## 2021-05-15 DIAGNOSIS — L84 Corns and callosities: Secondary | ICD-10-CM | POA: Diagnosis not present

## 2021-05-15 DIAGNOSIS — I739 Peripheral vascular disease, unspecified: Secondary | ICD-10-CM | POA: Diagnosis not present

## 2021-05-15 DIAGNOSIS — E782 Mixed hyperlipidemia: Secondary | ICD-10-CM | POA: Diagnosis not present

## 2021-05-15 NOTE — Progress Notes (Signed)
°  Subjective:  Patient ID: Jonathon Cain, male    DOB: [birth year],  MRN: [id number]  Chief Complaint  Patient presents with   Foot Pain      (xray)right foot pad and heel pain/left foot pain on pad    61 y.o. male presents with the above complaint. History confirmed with patient.   Objective:  Physical Exam: warm, good capillary refill, DP reduced right, no trophic changes or ulcerative lesions, normal sensory exam and PT reduced right.  Bilateral foot: callus plantar hallux    Assessment:   1. Callus of foot   2. Peripheral arterial disease ([place])      Plan:  Patient was evaluated and treated and all questions answered.  All symptomatic hyperkeratoses were safely debrided with a sterile #15 blade to patient's level of comfort without incident. We discussed preventative and palliative care of these lesions including supportive and accommodative shoegear, padding, prefabricated and custom molded accommodative orthoses, use of a pumice stone and lotions/creams daily.  New dancers pads were placed in his shoes to offload these.  Return if symptoms worsen or fail to improve.

## 2021-05-16 LAB — LIPID PANEL
Chol/HDL Ratio: 3 ratio (ref 0.0–5.0)
Cholesterol, Total: 111 mg/dL (ref 100–199)
HDL: 37 mg/dL — ABNORMAL LOW (ref >39)
LDL Chol Calc (NIH): 57 mg/dL (ref 0–99)
Triglycerides: 86 mg/dL (ref 0–149)
VLDL Cholesterol Cal: 17 mg/dL (ref 5–40)

## 2021-05-22 ENCOUNTER — Ambulatory Visit: Payer: Medicare HMO | Admitting: Cardiology

## 2021-05-22 ENCOUNTER — Encounter: Payer: Self-pay | Admitting: Cardiology

## 2021-05-22 ENCOUNTER — Other Ambulatory Visit: Payer: Self-pay

## 2021-05-22 ENCOUNTER — Telehealth: Payer: Self-pay | Admitting: Pharmacist

## 2021-05-22 VITALS — BP 103/54 | HR 54 | Temp 97.4°F | Resp 17 | Ht 70.0 in | Wt 286.8 lb

## 2021-05-22 DIAGNOSIS — Z006 Encounter for examination for normal comparison and control in clinical research program: Secondary | ICD-10-CM

## 2021-05-22 DIAGNOSIS — I5020 Unspecified systolic (congestive) heart failure: Secondary | ICD-10-CM | POA: Diagnosis not present

## 2021-05-22 DIAGNOSIS — I739 Peripheral vascular disease, unspecified: Secondary | ICD-10-CM

## 2021-05-22 NOTE — Progress Notes (Signed)
? ? ?  Chronic Care Management ?Pharmacy Assistant  ? ?Name: Jonathon Cain  MRN: [id number] DOB: 18-Feb-1961 ? ? ?Reason for Encounter: Disease State - General Adherence Call  ?  ? ?Recent office visits:  ?None noted.  ? ?Recent consult visits:  ?05/22/21 Jonathon Leep, MD - Cardiology - ABIs and ECHO performed. No changes noted. Follow up in 3 months.  ? ?05/15/21 Jonathon Cain DPM - Podiatry - Callus of foot - New dancers pads were placed in his shoes. Symptomatic hyperkeratoses were safely debrided with a sterile #15 blade. Follow up as needed.  ? ?Hospital visits:  ?None in previous 6 months ? ?Medications: ?Outpatient Encounter Medications as of 05/22/2021  ?Medication Sig  ? atorvastatin (LIPITOR) 80 MG tablet TAKE ONE TABLET BY MOUTH EVERYDAY AT BEDTIME  ? cetirizine (ZYRTEC) 10 MG tablet Take 10 mg by mouth daily as needed for allergies.  ? clopidogrel (PLAVIX) 75 MG tablet TAKE ONE TABLET BY MOUTH EVERY MORNING  ? ENTRESTO 49-51 MG TAKE ONE TABLET BY MOUTH every morning & TAKE ONE TABLET BY MOUTH AT bedtime  ? fluticasone (FLONASE) 50 MCG/ACT nasal spray Place 2 sprays into both nostrils daily as needed for allergies or rhinitis.   ? furosemide (LASIX) 40 MG tablet Take 1 tablet (40 mg total) by mouth as needed. Take only if you have more than usual leg swelling  ? metoprolol succinate (TOPROL-XL) 25 MG 24 hr tablet Take 0.5 tablets (12.5 mg total) by mouth every other day.  ? spironolactone (ALDACTONE) 25 MG tablet TAKE 1 TABLET(25 MG) BY MOUTH DAILY  ? tamsulosin (FLOMAX) 0.4 MG CAPS capsule Take 1 capsule (0.4 mg total) by mouth daily as needed (urine flow).  ? XARELTO 2.5 MG TABS tablet TAKE ONE TABLET BY MOUTH every morning and TAKE ONE TABLET BY MOUTH every evening  ? ?No facility-administered encounter medications on file as of 05/22/2021.  ? ? ?Have you had any problems recently with your health? ?Patient denied any problems and just saw Cardiologist today and had no changes.  ? ?Have you had any  problems with your pharmacy? ?Patient denied any problems with his current pharmacy.  ? ?What issues or side effects are you having with your medications? ?Patient denied any issues or side effects from his current medications.  ? ?What would you like me to pass along to Jonathon Cain, CPP for them to help you with?  ?Patient did not have anything to pass along to CPP at this time.  ? ?What can we do to take care of you better? ?Patient did not have recommendations at this time.  ? ?Care Gaps ? ?AWV: done 02/20/21 ?Colonoscopy: done 06/28/28 ?DM Eye Exam: N/A ?DM Foot Exam: N/A ?Microalbumin: unknown ?HbgAIC: done 07/26/19 (5.5) ?DEXA: N/A ?Mammogram: N/A ? ? ?Star Rating Drugs: ?Atorvastatin (LIPITOR) 80 MG tablet - last filled 03/04/2021 90 days  ? ?Future Appointments  ?Date Time Provider [place]  ?08/21/2021 10:30 AM PCV-ECHO/VAS 1 PCV-IMG None  ?08/21/2021 11:15 AM PCV-ECHO/VAS 1 PCV-IMG None  ?09/10/2021 11:00 AM Cain, Jonathon Bowen, MD [facility]PCV None  ?01/19/2022  2:30 PM Jonathon Monarch, MD [facility]  ? ? ?Jonathon Cain, CCMA ?Clinical Pharmacist Assistant  ?[phone] ? ? ?

## 2021-05-22 NOTE — Progress Notes (Signed)
Patient referred by Maximiano Coss, NP for CAD  Subjective:   Jonathon Cain, male    DOB: [birth year], 61 y.o.   MRN: [id number]   Chief Complaint  Patient presents with   PAD   HFrEF   Follow-up    3 MONTH     HPI  61 y.o. 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 He is able to walk up to a mile. He has tinglign in his feet, but denies claudication symptoms.    Current Outpatient Medications:    atorvastatin (LIPITOR) 80 MG tablet, TAKE ONE TABLET BY MOUTH EVERYDAY AT BEDTIME, Disp: 103 tablet, Rfl: 0   cetirizine (ZYRTEC) 10 MG tablet, Take 10 mg by mouth daily as needed for allergies., Disp: , Rfl:    clopidogrel (PLAVIX) 75 MG tablet, TAKE ONE TABLET BY MOUTH EVERY MORNING, Disp: 103 tablet, Rfl: 0   ENTRESTO 49-51 MG, TAKE ONE TABLET BY MOUTH every morning & TAKE ONE TABLET BY MOUTH AT bedtime, Disp: 60 tablet, Rfl: 1   fluticasone (FLONASE) 50 MCG/ACT nasal spray, Place 2 sprays into both nostrils daily as needed for allergies or rhinitis. , Disp: , Rfl:    furosemide (LASIX) 40 MG tablet, Take 1 tablet (40 mg total) by mouth as needed. Take only if you have more than usual leg swelling, Disp: 30 tablet, Rfl: 1   metoprolol succinate (TOPROL-XL) 25 MG 24 hr tablet, Take 0.5 tablets (12.5 mg total) by mouth every other day., Disp: 45 tablet, Rfl: 2   spironolactone (ALDACTONE) 25 MG tablet, TAKE 1 TABLET(25 MG) BY MOUTH DAILY, Disp: 90 tablet, Rfl: 1   tamsulosin (FLOMAX) 0.4 MG CAPS capsule, Take 1 capsule (0.4 mg total) by mouth daily as needed (urine flow)., Disp: 90 capsule, Rfl: 1   XARELTO 2.5 MG TABS tablet, TAKE ONE TABLET BY MOUTH every morning and TAKE ONE TABLET BY  MOUTH every evening, Disp: 180 tablet, Rfl: 1    Cardiovascular and other pertinent studies:  EKG 02/21/2021: Sinus rhythm 55 bpm Low voltage in precordial leads  Left anterior fascicular block  Nonspecific T-abnormality  Lexiscan Tetrofosmin stress test 09/18/2020: Lexiscan nuclear stress test performed using 1-day protocol. Patient experienced dyspnea and chest discomfort SPECT images showed large sized, moderate intensity, fixed perfusion defect in mid to apical, anterior, inferior, and apical myocardium. There is severe decrease in myocardial thickening and wall motion. Stress LVEF 12%. High risk study.  Echocardiogram 09/18/2020:  Left ventricle cavity is moderately dilated. Normal left ventricular wall  thickness. Moderate global hypokinesis with apical dyskinesis.  LV  encocardial poorly visualized. LVEF probably around 35%.  Doppler evidence  of grade III (restricted) diastolic dysfunction, normal LAP.  Left atrial cavity is mildly dilated.  Moderate (Grade II) mitral regurgitation.  Mild tricuspid regurgitation.  No evidence of pulmonary hypertension.  No significant change compared to previous study on 10/24/2019.  PV intervention 05/21/2020: Left SFA CTO Successful retrograde revascularization with 5.0X150 mm [place] DCB  ABI 04/17/2020:  This exam reveals normal perfusion of the right lower extremity (ABI  1.00). There is mildly abnormal biphasic waveform at the right ankle.  This exam reveals moderately decreased perfusion of the left lower  extremity, noted at the post tibial artery level (ABI 0.75).  Severely  abnormal monophasic waveform at the left ankle.  Compared to 03/07/2020, right ABI 0.75 and left ABI 0.66.  Peripheral intervention 03/19/2020: Rt CIA 80% stenosis Lt CIA 95% stenosis Rt SFA short segment of CTO 3 vessel below the knee runoff Lt SFA short segment of CTO Occluded Lt ATA with 2 vessel below the knee runoff   PTA Rt SFA 5.0X80 mm In.Pact drug  coated balloon PTA and stenting Rt CIA 9.0X40 mm Eluvia drug eluting stent PTA and stenting Rt CIA 10.0X40 mm Eluvia drug eluting stent   Will stage Lt SFA revascularization, if clinically indicated  Cardiac MRI 02/01/2020: 1. Subendocardial late gadolinium enhancement consistent with prior infarct in LAD territory. There is >50% transmural LGE suggesting nonviability in mid anterior/anteroseptal walls, apical anterior/septal/inferior walls, and apex. 2. Mild LV dilatation with moderate systolic function (EF 16%). Akinesis of mid to apical anterior, mid anteroseptal, apical septal, apical inferior, and apex. 3.  Normal RV size and systolic function (EF 10%) 4.  Moderate mitral regurgitation (regurgitant fraction 24%) 5.  Moderate right pleural effusion   Coronary angiography 11/14/2019: LM: Diffuse 30% disease. IVUS MLA 7.3 mm2,. dFR 1.0 LAD: Patent prox LAD stent with udner expansion        Optimization with 3.5X15 mm [place] balloon at 22 atm        Excellent expansion as seen on IVUS        TIMI flow II-->III        Diag: Mod diffuse disease LCx: 80% ostial OM1 stenosis. dFR 0.98 RCA: Not engaged today  Left Heart Catheterization 07/26/19 (Dr. Einar Gip):  LM: Mild diffuse disease LAD: Very large caliber vessel, occluded at the proximal end.  Gives origin to high D1 which is large.  Successful PTCA and stenting of the proximal LAD overlapping D1, 3.5 x 24 mm Synergy DES deployed at 12 atmospheric pressure for 60 S. 100% to 0% with TIMI 0 to TIMI-3 flow. LCx: Large vessel, giving origin to a very large OM1 which is secondary branches.  Ostium of OM1 has 80% stenosis. RCA: Mild diffuse disease, distal RCA 15 to 20% stenosis, PDA 50% stenosis.   Recommendation: Patient will need aggressive risk factor modification, dual antiplatelet therapy with aspirin and Brilinta for a year, will be watched carefully in the intensive care unit for preshock, suspect his EDP is extremely high at the end of  the procedure with excessive contrast use.  Will monitor for any contrast nephropathy as well.  Recent labs: 10/29/2020: Glucose 86, BUN/Cr 12/1.08. EGFR 78. Na/K 141/4.2.  NT pro BNP 733  04/19/2020: Chol 119, TG 75, HDL 40, LDL 64  02/19/2020: Glucose 90, BUN/Cr 21/1.04. EGFR 77. Na/K 136/4.0.  AST/ALT 43/55, AlKP 216 H/H 16/50. MCV 87. Platelets 258   11/02/2019: Glucose 85, BUN/Cr 15/1.18. EGFR 66. Na/K 138/4.7.  H/H 14/46. MCV 91. Platelets 222  08/17/2019: Glucose 90, BUN/Cr 17/1.21. EGFR 65. Na/K 136/4.9.  H/H 14/44. MCV 90. Platelets 289  07/26/2019: Glucose 122, BUN/Cr 9/0.9. EGFR 60. H/H 16.1/49.0. MCV 90.2. Platelets 240 HbA1C 5.5% Chol 230, TG 95, HDL 36, LDL 175   Review of Systems  Cardiovascular:  Negative for chest pain, dyspnea on exertion, leg swelling, palpitations and syncope.  Gastrointestinal:  Positive for diarrhea.        Vitals:   05/22/21 1135 05/22/21 1143  BP: (!) 103/59 (!) 103/54  Pulse: (!) 50 (!) 54  Resp: 17   Temp: (!) 97.4 F (36.3 C)   SpO2:  96%  Body mass index is 41.15 kg/m. Filed Weights   05/22/21 1135  Weight: 286 lb 12.8 oz (130.1 kg)       Objective:   Physical Exam Vitals and nursing note reviewed.  Constitutional:      General: He is not in acute distress. Neck:     Vascular: No JVD.  Cardiovascular:     Rate and Rhythm: Normal rate and regular rhythm.     Pulses:          Dorsalis pedis pulses are 1+ on the left side.       Posterior tibial pulses are 0 on the right side and 1+ on the left side.     Heart sounds: Normal heart sounds. No murmur heard. Pulmonary:     Effort: Pulmonary effort is normal.     Breath sounds: Normal breath sounds. No wheezing or rales.  Musculoskeletal:     Right lower leg: No edema.     Left lower leg: No edema.          ICD-10-CM   1. HFrEF (heart failure with reduced ejection fraction) ([place])  I50.20 PCV ECHOCARDIOGRAM COMPLETE    2. Peripheral arterial  disease (H[facility])  I73.9 PCV ANKLE BRACHIAL INDEX (ABI)    3. Enrolled in clinical trial of drug  Z00.6      Orders Placed This Encounter  Procedures   PCV ECHOCARDIOGRAM COMPLETE   PCV ANKLE BRACHIAL INDEX (ABI)     Assessment & Recommendations:   61 y.o. 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 Residual mild below the knee disease Continue Plavix 75 mg daily, Xarelto 2.5 mg bid Check surveillance ABI in 08/2021  CAD: Constant chest pain.  Large area of LAD infarct, but no ischemia on stress test 09/2020.  S/p primary PCI to culprit prox LAD (07/2019) dFR negative 80% stenosis in OM1 (82956) LAD stent optimized under IVUS (10/2019) Continue Plavix 75 mg daily, Xarelto 2.5 mg bid Continue Lipitor 80. LDL down to 64.  Ischemic cardiomyopathy: Stable NYHA class II. EF has stable around 35% (echocardiogram 08/2020), MRI 36% (2021) Continue Entesto to 49-51 mg bid, spironolactone 25 mg daily, metoprolol succinate-prefers to take 12.5 mg every other day. Not interested in Barostim at this time. Echocardiogram in 08/2021. Enrolled in EMPACT-MI clinical trial EMPACT-MI: A Streamlined, Multicentre, Randomised, Parallel Group, Double-blind Placebo-controlled Superiority Trial to Evaluate the Effect of EMPAgliflozin on Hospitalisation for Heart Failure and Mortality in Patients With aCuTe Myocardial Infarction  F/u in 3 months   [place], MD [facility] Pager: [phone] Office: [phone]

## 2021-05-30 ENCOUNTER — Telehealth: Payer: Self-pay | Admitting: Pharmacist

## 2021-05-30 NOTE — Progress Notes (Signed)
? ? ?Chronic Care Management ?Pharmacy Assistant  ? ?Name: Jonathon Cain  MRN: [id number] DOB: 28-Aug-1960 ? ? ?Reason for Encounter: Disease State - Monthly Medication Coordination Call  ?  ? ?Medications: ?Outpatient Encounter Medications as of 05/30/2021  ?Medication Sig  ? atorvastatin (LIPITOR) 80 MG tablet TAKE ONE TABLET BY MOUTH EVERYDAY AT BEDTIME  ? cetirizine (ZYRTEC) 10 MG tablet Take 10 mg by mouth daily as needed for allergies.  ? clopidogrel (PLAVIX) 75 MG tablet TAKE ONE TABLET BY MOUTH EVERY MORNING  ? ENTRESTO 49-51 MG TAKE ONE TABLET BY MOUTH every morning & TAKE ONE TABLET BY MOUTH AT bedtime  ? fluticasone (FLONASE) 50 MCG/ACT nasal spray Place 2 sprays into both nostrils daily as needed for allergies or rhinitis.   ? furosemide (LASIX) 40 MG tablet Take 1 tablet (40 mg total) by mouth as needed. Take only if you have more than usual leg swelling  ? guaiFENesin (MUCINEX) 600 MG 12 hr tablet Take by mouth 2 (two) times daily.  ? Investigational - Study Medication Study name: EMPACT MI ?Additional study details: EMPACT-MI: A Streamlined, Multicentre, Randomised, Parallel Group, Double-blind Placebo-controlled Superiority Trial to Evaluate the Effect of EMPAgliflozin on Hospitalisation for Heart Failure and Mortality in Patients With aCuTe Myocardial Infarction  ? metoprolol succinate (TOPROL-XL) 25 MG 24 hr tablet Take 0.5 tablets (12.5 mg total) by mouth every other day.  ? spironolactone (ALDACTONE) 25 MG tablet TAKE 1 TABLET(25 MG) BY MOUTH DAILY  ? tamsulosin (FLOMAX) 0.4 MG CAPS capsule Take 1 capsule (0.4 mg total) by mouth daily as needed (urine flow).  ? XARELTO 2.5 MG TABS tablet TAKE ONE TABLET BY MOUTH every morning and TAKE ONE TABLET BY MOUTH every evening  ? ?No facility-administered encounter medications on file as of 05/30/2021.  ? ?Reviewed chart for medication changes ahead of medication coordination call. ? ?No OVs, Consults, or hospital visits since last care coordination  call/Pharmacist visit. (If appropriate, list visit date, provider name) ? ?No medication changes indicated OR if recent visit, treatment plan here. ? ?BP Readings from Last 3 Encounters:  ?05/22/21 (!) 103/54  ?02/21/21 115/71  ?12/03/20 114/61  ?  ?Lab Results  ?Component Value Date  ? HGBA1C 5.5 07/26/2019  ?  ? ?Patient obtains medications through Adherence Packaging  90 Days  ? ?Last adherence delivery included: (medication name and frequency) ? ?Spirolactone 25 mg 1 tablet daily ?Entresto 49/51 1 tablet daily ?Metoprolol ER 25 mg  1/2 tablet every other day ?Atorvastatin 80 mg 1 tablet daily ?Clopidogrel 75 mg 1 tablet daily   ?Xarelto 2.'5mg'$  1 tablet BID (receiving 30 day acute fill pt request per pharmacy today) ?Cetrizine 10 mg 1 tablet daily  ?Patient is due for next adherence delivery on: 06/10/21. ?Called patient and reviewed medications and coordinated delivery. ? ?This delivery to include: ? ?Spirolactone 25 mg 1 tablet daily ?Entresto 49/51 1 tablet daily ?Metoprolol ER 25 mg  1/2 tablet every other day ?Atorvastatin 80 mg 1 tablet daily ?Clopidogrel 75 mg 1 tablet daily   ?Xarelto 2.'5mg'$  1 tablet BID ?Cetrizine 10 mg 1 tablet daily ?Furosemide 40 mg 1 tablet daily as need (in vial) ? ?  ?Patient is due for next adherence delivery on: 06/10/21. ?Called patient and reviewed medications and coordinated delivery. ? ? ?Patient needs refills for:  ?Atorvastatin 80 mg 1 tablet daily ?Clopidogrel 75 mg 1 tablet daily   ?Spirolactone 25 mg 1 tablet daily ? ?Confirmed delivery date of 06/10/21, advised patient that pharmacy  will contact them the morning of delivery. ? ? ?Care Gaps ?  ?AWV: done 02/20/21 ?Colonoscopy: done 06/28/28 ?DM Eye Exam: N/A ?DM Foot Exam: N/A ?Microalbumin: unknown ?HbgAIC: done 07/26/19 (5.5) ?DEXA: N/A ?Mammogram: N/A ? ? ?Star Rating Drugs: ?Atorvastatin (LIPITOR) 80 MG tablet - last filled 03/04/2021 90 days  ? ?Future Appointments  ?Date Time Provider [place]  ?07/02/2021 10:00 AM  [facility] PHARMACIST [facility]  ?08/21/2021 10:30 AM [facility]-ECHO/VAS 1 [facility]IMG None  ?08/21/2021 11:15 AM PCV-ECHO/VAS 1 [facility]V-IMG None  ?09/10/2021 11:00 AM Patwardhan, Reynold Bowen, MD [facility] None  ?01/19/2022  2:30 PM Lavonna Monarch, MD [facility]  ? ? ?Liza Showfety, CCMA ?Clinical Pharmacist Assistant  ?([phone] ? ?

## 2021-06-03 ENCOUNTER — Other Ambulatory Visit: Payer: Self-pay | Admitting: Cardiology

## 2021-06-03 DIAGNOSIS — I5020 Unspecified systolic (congestive) heart failure: Secondary | ICD-10-CM

## 2021-06-03 DIAGNOSIS — I25118 Atherosclerotic heart disease of native coronary artery with other forms of angina pectoris: Secondary | ICD-10-CM

## 2021-06-03 DIAGNOSIS — I739 Peripheral vascular disease, unspecified: Secondary | ICD-10-CM

## 2021-06-16 ENCOUNTER — Encounter: Payer: Self-pay | Admitting: Registered Nurse

## 2021-06-16 ENCOUNTER — Telehealth (INDEPENDENT_AMBULATORY_CARE_PROVIDER_SITE_OTHER): Payer: Medicare HMO | Admitting: Registered Nurse

## 2021-06-16 ENCOUNTER — Other Ambulatory Visit: Payer: Self-pay

## 2021-06-16 DIAGNOSIS — R197 Diarrhea, unspecified: Secondary | ICD-10-CM

## 2021-06-16 MED ORDER — DIPHENOXYLATE-ATROPINE 2.5-0.025 MG PO TABS: 1.0000 | ORAL_TABLET | Freq: Four times a day (QID) | ORAL | 0 refills | Status: DC | PRN

## 2021-06-16 NOTE — Patient Instructions (Signed)
° ° ° °  If you have lab work done today you will be contacted with your lab results within the next 2 weeks.  If you have not heard from us then please contact us. The fastest way to get your results is to register for My Chart. ° ° °IF you received an x-ray today, you will receive an invoice from [facility]. Please contact  Radiology at [phone] with questions or concerns regarding your invoice.  ° °IF you received labwork today, you will receive an invoice from [facility]. Please contact [facility] at [phone] with questions or concerns regarding your invoice.  ° °Our billing staff will not be able to assist you with questions regarding bills from these companies. ° °You will be contacted with the lab results as soon as they are available. The fastest way to get your results is to activate your My Chart account. Instructions are located on the last page of this paperwork. If you have not heard from us regarding the results in 2 weeks, please contact this office. °  ° ° ° °

## 2021-06-16 NOTE — Progress Notes (Signed)
? ? ?Telemedicine Encounter- SOAP NOTE Established Patient ? ?This telephone encounter was conducted with the patient's (or proxy's) verbal consent via audio telecommunications: yes/no: Yes ?Patient was instructed to have this encounter in a suitably private space; and to only have persons present to whom they give permission to participate. In addition, patient identity was confirmed by use of name plus two identifiers (DOB and address).  I discussed the limitations, risks, security and privacy concerns of performing an evaluation and management service by telephone and the availability of in person appointments. I also discussed with the patient that there may be a patient responsible charge related to this service. The patient expressed understanding and agreed to proceed. ? ?I spent a total of 14 minutes talking with the patient or their proxy. ? ?Patient at home ?Provider in office ? ?Participants: Kathrin Ruddy, NP and Lorenza Burton Mancil ? ?Chief Complaint  ?Patient presents with  ? Diarrhea  ?  Patient states he has been having chronic for years. Patient states he usually have an episode once a month now its like once a week. Patient thinks he has IBS.  ? ? ?Subjective  ? ?Jonathon Cain is a 61 y.o. established patient. Telephone visit today for diarrhea ? ?HPI ?Longstanding diarrhea, usually once monthly ?Now happening once a week or more ?No blood or mucus in stool ?Weight has been steady. ?Colonoscopy 2020 with 1 benign polyp. On 10 year recall.  ?Uses imodium with some relief. Often leads to 1-2 days constipation ? ?Patient Active Problem List  ? Diagnosis Date Noted  ? Sinusitis 01/08/2021  ? Edema 10/03/2020  ? Critical lower limb ischemia ([place]) 05/20/2020  ? Mixed hyperlipidemia 03/29/2020  ? Pre-ulcerative calluses 03/13/2020  ? Enrolled in clinical trial of drug 02/02/2020  ? Peripheral arterial disease ([place]) 02/02/2020  ? HFrEF (heart failure with reduced ejection fraction) ([place]) 08/02/2019  ?  Coronary artery disease of native artery of native heart with stable angina pectoris ([place]) 08/02/2019  ? Acute ST elevation myocardial infarction (STEMI) of anterolateral wall ([place]) 07/26/2019  ? 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 Leukocytosis 10/30/2016  ? Peripheral vision loss, bilateral 03/05/2016  ? Retinitis pigmentosa 12/11/2015  ? Acquired night blindness 08/08/2015  ? 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 Epiretinal membrane (ERM) of both eyes 08/08/2015  ? 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 Peripheral visual field defect of both eyes 08/08/2015  ? 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 Posterior vitreous detachment of both eyes 08/08/2015  ? Pseudophakia of both eyes 08/08/2015  ? ? ?Past Medical History:  ?Diagnosis Date  ? Allergy   ? Basal cell carcinoma 09/03/2020  ? sup & nod- Left upper arm-posterior (Cx35FU)  ? Bladder outlet obstruction   ? Cancer [facility])   ? Skin  ? CHF (congestive heart failure) ([place])   ? Coronary artery disease   ? Hyperlipidemia   ? Hypertension   ? Melanoma ([place]) 09/03/2020  ? in situ- Left upper back (EXC)  ? Retinitis pigmentosa   ? ? ?Current Outpatient Medications  ?Medication Sig Dispense Refill  ? atorvastatin (LIPITOR) 80 MG tablet TAKE ONE TABLET BY MOUTH EVERYDAY AT BEDTIME 103 tablet 0  ? cetirizine (ZYRTEC) 10 MG tablet Take 10 mg by mouth daily as needed for allergies.    ? clopidogrel (PLAVIX) 75 MG tablet TAKE ONE TABLET BY MOUTH EVERY MORNING 103 tablet 0  ? diphenoxylate-atropine (LOMOTIL) 2.5-0.025 MG tablet Take 1 tablet by mouth  4 (four) times daily as needed for diarrhea or loose stools. 30 tablet 0  ? ENTRESTO 49-51 MG TAKE ONE TABLET BY MOUTH every morning & TAKE ONE TABLET BY MOUTH AT bedtime 60 tablet 1  ? fluticasone (FLONASE) 50 MCG/ACT nasal spray Place 2 sprays into both nostrils daily as needed for allergies or rhinitis.     ? furosemide (LASIX) 40 MG tablet Take 1 tablet (40 mg  total) by mouth as needed. Take only if you have more than usual leg swelling 30 tablet 1  ? guaiFENesin (MUCINEX) 600 MG 12 hr tablet Take by mouth 2 (two) times daily.    ? Investigational - Study Medication Study name: EMPACT MI ?Additional study details: EMPACT-MI: A Streamlined, Multicentre, Randomised, Parallel Group, Double-blind Placebo-controlled Superiority Trial to Evaluate the Effect of EMPAgliflozin on Hospitalisation for Heart Failure and Mortality in Patients With aCuTe Myocardial Infarction    ? metoprolol succinate (TOPROL-XL) 25 MG 24 hr tablet Take 0.5 tablets (12.5 mg total) by mouth every other day. 45 tablet 2  ? spironolactone (ALDACTONE) 25 MG tablet TAKE ONE TABLET BY MOUTH EVERY MORNING 90 tablet 1  ? tamsulosin (FLOMAX) 0.4 MG CAPS capsule Take 1 capsule (0.4 mg total) by mouth daily as needed (urine flow). 90 capsule 1  ? XARELTO 2.5 MG TABS tablet TAKE ONE TABLET BY MOUTH every morning and TAKE ONE TABLET BY MOUTH every evening 180 tablet 1  ? ?No current facility-administered medications for this visit.  ? ? ?No Known Allergies ? ?Social History  ? ?Socioeconomic History  ? Marital status: Single  ?  Spouse name: Not on file  ? Number of children: 0  ? Years of education: 75  ? Highest education level: Bachelor's degree (e.g., BA, AB, BS)  ?Occupational History  ? Not on file  ?Tobacco Use  ? Smoking status: Former  ?  Packs/day: 1.50  ?  Years: 40.00  ?  Pack years: 60.00  ?  Types: Cigarettes  ?  Quit date: 07/26/2019  ?  Years since quitting: 1.8  ? Smokeless tobacco: Never  ?Vaping Use  ? Vaping Use: Never used  ?Substance and Sexual Activity  ? Alcohol use: Yes  ?  Comment: occasional  ? Drug use: No  ? Sexual activity: Not Currently  ?Other Topics Concern  ? Not on file  ?Social History Narrative  ? Not on file  ? ?Social Determinants of Health  ? ?Financial Resource Strain: Low Risk   ? Difficulty of Paying Living Expenses: Not hard at all  ?Food Insecurity: No Food Insecurity  ?  Worried About Charity fundraiser in the Last Year: Never true  ? Ran Out of Food in the Last Year: Never true  ?Transportation Needs: No Transportation Needs  ? Lack of Transportation (Medical): No  ? Lack of Transportation (Non-Medical): No  ?Physical Activity: Inactive  ? Days of Exercise per Week: 0 days  ? Minutes of Exercise per Session: 0 min  ?Stress: No Stress Concern Present  ? Feeling of Stress : Not at all  ?Social Connections: Socially Isolated  ? Frequency of Communication with Friends and Family: Twice a week  ? Frequency of Social Gatherings with Friends and Family: Twice a week  ? Attends Religious Services: Never  ? Active Member of Clubs or Organizations: No  ? Attends Archivist Meetings: Never  ? Marital Status: Never married  ?Intimate Partner Violence: Not At Risk  ? Fear of Current or Ex-Partner: No  ?  Emotionally Abused: No  ? Physically Abused: No  ? Sexually Abused: No  ? ? ?Review of Systems  ?Constitutional: Negative.   ?HENT: Negative.    ?Eyes: Negative.   ?Respiratory: Negative.    ?Cardiovascular: Negative.   ?Gastrointestinal:  Positive for diarrhea. Negative for abdominal pain, blood in stool, constipation, heartburn, melena, nausea and vomiting.  ?Genitourinary: Negative.   ?Musculoskeletal: Negative.   ?Skin: Negative.   ?Neurological: Negative.   ?Endo/Heme/Allergies: Negative.   ?Psychiatric/Behavioral: Negative.    ?All other systems reviewed and are negative. ? ?Objective  ? ?Vitals as reported by the patient: ?There were no vitals filed for this visit. ? ?Rodolfo was seen today for diarrhea. ? ?Diagnoses and all orders for this visit: ? ?Frequent diarrhea ?-     diphenoxylate-atropine (LOMOTIL) 2.5-0.025 MG tablet; Take 1 tablet by mouth 4 (four) times daily as needed for diarrhea or loose stools. ? ? ? ?PLAN ?Will try lomotil  ?Consider dicyclomine after clearing with cardiology. ?Discuss monitoring diet for changes associated with symptoms ?Patient encouraged to  call clinic with any questions, comments, or concerns. ? ?I discussed the assessment and treatment plan with the patient. The patient was provided an opportunity to ask questions and all were answered. The patie

## 2021-06-26 NOTE — Progress Notes (Signed)
? ?Chronic Care Management ?Pharmacy Note ? ?07/02/2021 ?Name:  Jonathon Cain MRN:  [id number] DOB:  04/17/1960 ? ?Subjective: ?Jonathon Cain is an 61 y.o. year old male who is a primary patient of Morrow, Richard, NP.  The CCM team was consulted for assistance with disease management and care coordination needs.   ? ?Engaged with patient by telephone for follow up visit in response to provider referral for pharmacy case management and/or care coordination services.  ? ?Consent to Services:  ?The patient was given the following information about Chronic Care Management services today, agreed to services, and gave verbal consent: 1. CCM service includes personalized support from designated clinical staff supervised by the primary care provider, including individualized plan of care and coordination with other care providers 2. 24/7 contact phone numbers for assistance for urgent and routine care needs. 3. Service will only be billed when office clinical staff spend 20 minutes or more in a month to coordinate care. 4. Only one practitioner may furnish and bill the service in a calendar month. 5.The patient may stop CCM services at any time (effective at the end of the month) by phone call to the office staff. 6. The patient will be responsible for cost sharing (co-pay) of up to 20% of the service fee (after annual deductible is met). Patient agreed to services and consent obtained. ? ?Patient Care Team: ?Morrow, Richard, NP as PCP - General (Adult Health Nurse Practitioner) ?Patwardhan, Manish J, MD as Consulting Physician (Cardiology) ?Iannaccone, Alessandro, MD as Referring Physician (Ophthalmology) ?Tafeen, Stuart, MD as Consulting Physician (Dermatology) ?Potts, Jacob, RPH as Pharmacist (Pharmacist) ?Davis, Christian L, RPH (Pharmacist) ? ?Hospital visits: ?None in previous 6 months ? ?Objective: ? ?Lab Results  ?Component Value Date  ? CREATININE 0.95 11/13/2020  ? CREATININE 1.08 10/29/2020  ? CREATININE  0.95 05/17/2020  ? ? ?Lab Results  ?Component Value Date  ? HGBA1C 5.5 07/26/2019  ? ?Last diabetic Eye exam: No results found for: HMDIABEYEEXA  ?Last diabetic Foot exam: No results found for: HMDIABFOOTEX  ? ?   ?Component Value Date/Time  ? CHOL 111 05/15/2021 1158  ? CHOL 119 04/19/2020 0935  ? CHOL 230 (H) 07/26/2019 1854  ? TRIG 86 05/15/2021 1158  ? TRIG 75 04/19/2020 0935  ? TRIG 95 07/26/2019 1854  ? HDL 37 (L) 05/15/2021 1158  ? HDL 40 04/19/2020 0935  ? HDL 36 (L) 07/26/2019 1854  ? CHOLHDL 3.0 05/15/2021 1158  ? CHOLHDL 6.4 07/26/2019 1854  ? VLDL 19 07/26/2019 1854  ? LDLCALC 57 05/15/2021 1158  ? LDLCALC 64 04/19/2020 0935  ? LDLCALC 175 (H) 07/26/2019 1854  ? ? ? ?  Latest Ref Rng & Units 02/19/2020  ?  2:48 PM 07/26/2019  ?  6:54 PM 12/03/2016  ? 10:16 AM  ?Hepatic Function  ?Total Protein 6.0 - 8.3 g/dL 7.4   6.9   7.1    ?Albumin 3.5 - 5.2 g/dL 3.9   3.0   2.9    ?AST 0 - 37 U/L 43   52   24    ?ALT 0 - 53 U/L 55   53   32    ?Alk Phosphatase 39 - 117 U/L 216   150   149    ?Total Bilirubin 0.2 - 1.2 mg/dL 1.1   0.8   0.44    ? ? ?No results found for: TSH, FREET4 ? ? ?  Latest Ref Rng & Units 11/13/2020  ?  2:52 PM   05/17/2020  ?  2:05 PM 03/13/2020  ?  2:17 PM  ?CBC  ?WBC 3.4 - 10.8 x10E3/uL 13.3   16.4   15.5    ?Hemoglobin 13.0 - 17.7 g/dL 15.8   15.7   16.9    ?Hematocrit 37.5 - 51.0 % 49.0   46.8   49.7    ?Platelets 150 - 450 x10E3/uL 278   241   264    ? ? ?No results found for: VD25OH ? ?Clinical ASCVD:  ?The ASCVD Risk score (Arnett DK, et al., 2019) failed to calculate for the following reasons: ?  The patient has a prior MI or stroke diagnosis   ?Social History  ? ?Tobacco Use  ?Smoking Status Former  ? Packs/day: 1.50  ? Years: 40.00  ? Pack years: 60.00  ? Types: Cigarettes  ? Quit date: 07/26/2019  ? Years since quitting: 1.9  ?Smokeless Tobacco Never  ? ?BP Readings from Last 3 Encounters:  ?05/22/21 (!) 103/54  ?02/21/21 115/71  ?12/03/20 114/61  ? ?Pulse Readings from Last 3 Encounters:   ?05/22/21 (!) 54  ?02/21/21 67  ?12/03/20 60  ? ?Wt Readings from Last 3 Encounters:  ?05/22/21 286 lb 12.8 oz (130.1 kg)  ?02/21/21 280 lb (127 kg)  ?12/03/20 277 lb 9.6 oz (125.9 kg)  ? ?BMI Readings from Last 3 Encounters:  ?05/22/21 41.15 kg/m?  ?02/21/21 40.18 kg/m?  ?12/03/20 39.83 kg/m?  ? ? ?Assessment: Review of patient past medical history, allergies, medications, health status, including review of consultants reports, laboratory and other test data, was performed as part of comprehensive evaluation and provision of chronic care management services.  ? ?SDOH:  (Social Determinants of Health) assessments and interventions performed: Yes ? ? ?[place] ? ?No Known Allergies ? ?Medications Reviewed Today   ? ? Reviewed by Edythe Clarity, RPH (Pharmacist) on 07/02/21 at 1046  Med List Status: <None>  ? ?Medication Order Taking? Sig Documenting Provider Last Dose Status Informant  ?atorvastatin (LIPITOR) 80 MG tablet [id number] Yes TAKE ONE TABLET BY MOUTH EVERYDAY AT BEDTIME Patwardhan, Manish J, MD Taking Active   ?cetirizine (ZYRTEC) 10 MG tablet [id number] Yes Take 10 mg by mouth daily as needed for allergies. [provider] Taking Active Self  ?clopidogrel (PLAVIX) 75 MG tablet [id number] Yes TAKE ONE TABLET BY MOUTH EVERY MORNING Patwardhan, Reynold Bowen, MD Taking Active   ?diphenoxylate-atropine (LOMOTIL) 2.5-0.025 MG tablet [id number] Yes Take 1 tablet by mouth 4 (four) times daily as needed for diarrhea or loose stools. Maximiano Coss, NP Taking Active   ?ENTRESTO 49-51 MG [id number] Yes TAKE ONE TABLET BY MOUTH every morning & TAKE ONE TABLET BY MOUTH AT bedtime Patwardhan, Manish J, MD Taking Active   ?fluticasone (FLONASE) 50 MCG/ACT nasal spray [id number] Yes Place 2 sprays into both nostrils daily as needed for allergies or rhinitis.  [provider] Taking Active Self  ?furosemide (LASIX) 40 MG tablet [id number] Yes Take 1 tablet (40 mg total) by mouth as needed. Take only if  you have more than usual leg swelling Patwardhan, Reynold Bowen, MD Taking Active   ?guaiFENesin (MUCINEX) 600 MG 12 hr tablet [id number] Yes Take by mouth 2 (two) times daily. [provider] Taking Active   ?Investigational - Study Medication [id number] Yes Study name: EMPACT MI ?Additional study details: EMPACT-MI: A Streamlined, Multicentre, Randomised, Parallel Group, Double-blind Placebo-controlled Superiority Trial to Evaluate the Effect of EMPAgliflozin on Hospitalisation for Heart Failure and Mortality in Patients With aCuTe Myocardial Infarction [provider] Taking Active   ?metoprolol succinate (TOPROL-XL) 25 MG 24 hr tablet [id number] Yes Take 0.5 tablets (12.5 mg total) by mouth every other day. Patwardhan, Manish J, MD Taking Active   ?spironolactone (ALDACTONE) 25 MG tablet [id number] Yes TAKE ONE TABLET BY MOUTH EVERY MORNING Patwardhan, Manish J, MD Taking Active   ?tamsulosin (FLOMAX) 0.4 MG CAPS capsule [id number] Yes Take 1 capsule (0.4 mg total) by mouth daily as needed (urine flow). Morrow, Richard, NP Taking Active   ?XARELTO 2.5 MG TABS tablet [id number] Yes TAKE ONE TABLET BY MOUTH every morning and TAKE ONE TABLET BY MOUTH every evening Patwardhan, Manish J, MD Taking Active   ? ?  ?  ? ?  ? ?Patient Active Problem List  ? Diagnosis Date Noted  ? Sinusitis 01/08/2021  ? Edema 10/03/2020  ? Critical lower limb ischemia ([facility]) 05/20/2020  ? Mixed hyperlipidemia 03/29/2020  ? Pre-ulcerative calluses 03/13/2020  ? Enrolled in clinical trial of drug 02/02/2020  ? Peripheral arterial disease ([facility]) 02/02/2020  ? HFrEF (heart failure with reduced ejection fraction) ([facility]) 08/02/2019  ? Coronary artery disease of native artery of native heart with stable angina pectoris ([facility]) 08/02/2019  ? Acute ST elevation myocardial infarction (STEMI) of anterolateral wall ([facility]) 07/26/2019  ? 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 Leukocytosis 10/30/2016  ? Peripheral vision loss,  bilateral 03/05/2016  ? Retinitis pigmentosa 12/11/2015  ? Acquired night blindness 08/08/2015  ? 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 Epiretinal membrane (ERM) of both

## 2021-07-02 ENCOUNTER — Telehealth: Payer: Self-pay | Admitting: Pharmacist

## 2021-07-02 ENCOUNTER — Ambulatory Visit (INDEPENDENT_AMBULATORY_CARE_PROVIDER_SITE_OTHER): Payer: Medicare HMO | Admitting: Pharmacist

## 2021-07-02 DIAGNOSIS — I5020 Unspecified systolic (congestive) heart failure: Secondary | ICD-10-CM

## 2021-07-02 DIAGNOSIS — E782 Mixed hyperlipidemia: Secondary | ICD-10-CM

## 2021-07-02 NOTE — Progress Notes (Signed)
? ? ?  Chronic Care Management ?Pharmacy Assistant  ? ?Name: Jonathon Cain  MRN: [id number] DOB: 08/17/1960 ? ? ?Reason for Encounter: PAP for Entresto ? ?PAP form initiated for Entresto . Will be mailed to patient to complete patient portion and return to prescribing MD office for final portion to be completed by MD and faxed in for patient for processing. Patient will update on the outcome of their application once received.  ? ? ?Liza Showfety, CCMA ?Clinical Pharmacist Assistant  ?([phone] ? ?  ? ?

## 2021-07-02 NOTE — Patient Instructions (Addendum)
Visit Information ? ? Goals Addressed   ? ?  ?  ?  ?  ? This Visit's Progress  ?  Track and Manage Fluids and Swelling-Heart Failure     ?  Timeframe:  Long-Range Goal ?Priority:  High ?Start Date:   07/02/21                          ?Expected End Date:  01/01/22                   ? ?Follow Up Date 10/01/21  ?  ?- call office if I gain more than 2 pounds in one day or 5 pounds in one week ?- keep legs up while sitting ?- weigh myself daily  ?  ?Why is this important?   ?It is important to check your weight daily and watch how much salt and liquids you have.  ?It will help you to manage your heart failure.  ?  ?Notes:  ?  ? ?  ? ?Patient Care Plan: [facility] pharmacy care plan  ?  ? ?Problem Identified: pad cad   ?Priority: High  ?  ? ?Long-Range Goal: disease management   ?Start Date: 11/13/2020  ?Expected End Date: 11/13/2021  ?This Visit's Progress: On track  ?Priority: High  ?Note:   ? ?Pharmacist Clinical Goal(s):  ?Patient will verbalize ability to afford treatment regimen through collaboration with PharmD and provider.  ? ?Interventions: ?1:1 collaboration with Maximiano Coss, NP regarding development and update of comprehensive plan of care as evidenced by provider attestation and co-signature ?Inter-disciplinary care team collaboration (see longitudinal plan of care) ?Comprehensive medication review performed; medication list updated in electronic medical record ? ?Hypertension/HFrEF (BP goal <130/80) ?-Controlled ?-Current treatment: ?Farxiga 10 mg once daily ?Spironolactone 25 mg once daily ?Furosemide 40 mg as needed  ?Entresto 49-51 twice daily  ?Metoprolol succinate 12.5 mg twice daily ?-Current exercise habits: impaired vision interferes with exercise. Will walk in driveway some, feels sob is slowly improving compared to previous years, can walk longer distances.  ?-Denies hypotensive/hypertensive symptoms ?-Educated on BP goals and benefits of medications for prevention of heart attack, stroke and kidney  damage; ?-Counseled to monitor BP at home 1-2x/wk, document, and provide log at future appointments ?-Recommended to continue current medication ? ?Hyperlipidemia: (LDL goal < 55-70 ) ?-Controlled, improved - LL 65 on 03/2020 down from 175 5/5 ?-PVD, CAD. 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 Secondary prevention on plavix, xarelto, asa, BB. -Reports ongoing loose stools - black and tar like, that have persisted months. Last CBC 04/2020. Feels there has not been any acute changes. Previous PPI use (stopped 07/2020?). Discussed with PCP and CBC ordered - patient was to going to [facility] today so agreeable to getting added CBC.  ?-Current treatment: ?Atorvastatin 80 mg once daily ?-side effect review, tolerating well ?-Educated on Cholesterol goals;  ?-Recommended to continue current medication ? ?Heart Failure (Goal: manage symptoms and prevent exacerbations) ?-Controlled ?-Last ejection fraction: 35% (Date: 08/2020) ?-HF type: HFrEF ?-NYHA Class: II ?-Current treatment: ?See htn ?-Educated on Importance of weighing daily; if you gain more than 3 pounds in one day or 5 pounds in one week, take furosemide ?-Recommended to continue current medication ?Counseled on daily weights ? ?Patient Goals/Self-Care Activities ?Patient will:  ?- take medications as prescribed ?collaborate with provider on medication access solutions ? ?Medication Assistance: will pursue PAP for Entresto and Farxiga. Verbal consent for enrollment in upstream pharmacy services obtained.  ? ?Patient's  preferred pharmacy is: ? ?[facility] - [place], [place] - [address] ?[address] ?[place] [address] ?Phone: [phone] Fax: [phone] ?  ? ?Patient Care Plan: General Pharmacy (Adult)  ?  ? ?Problem Identified: HFrEF, CAD, PVD, HLD, Edema   ?Priority: High  ?Onset Date: 07/02/2021  ?  ? ?Long-Range Goal: Patient-Specific Goal   ?Start Date: 07/02/2021  ?Expected End Date: 07/03/2022  ?This Visit's Progress: On  track  ?Priority: High  ?Note:   ? ?Pharmacist Clinical Goal(s):  ?Patient will verbalize ability to afford treatment regimen through collaboration with PharmD and provider.  ? ?Interventions: ?1:1 collaboration with Maximiano Coss, NP regarding development and update of comprehensive plan of care as evidenced by provider attestation and co-signature ?Inter-disciplinary care team collaboration (see longitudinal plan of care) ?Comprehensive medication review performed; medication list updated in electronic medical record ? ?Hypertension (BP goal <130/80) ?-Controlled ?-Current treatment: ?Spironolactone 25 mg once daily ?Furosemide 40 mg as needed  ?Entresto 49-51 twice daily  ?Metoprolol succinate 12.5 mg twice daily ?-Current exercise habits: impaired vision interferes with exercise. Will walk in driveway some, feels sob is slowly improving compared to previous years, can walk longer distances.  ?-Denies hypotensive/hypertensive symptoms ?-Educated on BP goals and benefits of medications for prevention of heart attack, stroke and kidney damage; ?-Counseled to monitor BP at home 1-2x/wk, document, and provide log at future appointments ?-Recommended to continue current medication ? ?Hyperlipidemia: (LDL goal < 55-70 ) ?-Controlled, improved - LL 65 on 03/2020 down from 175 5/5 ?-PVD, CAD. 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 Secondary prevention on plavix, xarelto, asa, BB. -Reports ongoing loose stools - black and tar like, that have persisted months. Last CBC 04/2020. Feels there has not been any acute changes. Previous PPI use (stopped 07/2020?). Discussed with PCP and CBC ordered - patient was to going to [facility] today so agreeable to getting added CBC.  ?-Current treatment: ?Atorvastatin 80 mg once daily Appropriate, Effective, Safe, Accessible ?-side effect review, tolerating well ?-Educated on Cholesterol goals;  ?-Recommended to continue current medication ? ?Update 07/02/21 ?Most recent LDL continues to improve.  He  reports adherence and is very pleased with most recent lipids. ?He does still mention persistent loose stools. ?Recent started Lomotil and has only had to use two doses so far. ?Continue to monitor, no changes at thist ime. ? ?Heart Failure (Goal: manage symptoms and prevent exacerbations) ?-Controlled ?-Last ejection fraction: 35% (Date: 08/2020) ?-HF type: HFrEF ?-NYHA Class: II ?-Current treatment: ?Spironolactone 25 mg once daily Appropriate, Effective, Safe, Accessible ?Furosemide 40 mg as needed Appropriate, Effective, Safe, Accessible ?Entresto 49-51 twice daily Appropriate, Effective, Safe, Accessible ?Metoprolol succinate 12.5 mg twice daily Appropriate, Effective, Safe, Accessible ?Investigational drug (Jardiance) Appropriate, Effective, Safe, Accessible ?-Educated on Importance of weighing daily; if you gain more than 3 pounds in one day or 5 pounds in one week, take furosemide ?-Recommended to continue current medication ?Counseled on daily weights ? ?Update 07/02/21 ?Does still have occasional swelling.  Has gained some weight but does not feel it is due to fluid.  Taking Lasix prn when swelling is excessive.  Some SOB recently.  Have asked him to continue to monitor weight and other symptoms. ?Contact providers if swelling or SOB worsens. ?No changes at this time.  I do believe he would qualify for Entresto PAP so we started that process today. ? ?Patient Goals/Self-Care Activities ?Patient will:  ?- take medications as prescribed ?collaborate with provider on medication access solutions ? ?  Medication Assistance: will pursue PAP for Entresto.. Verbal consent for enrollment in upstream pharmacy services obtained.  ? ?Patient's preferred pharmacy is: ? ?[facility] - [place], [place] - [address] ?[address] ?[place] [address] ?Phone: [phone] Fax: [phone] ?  ? ?  ?  ? ?The patient verbalized understanding of instructions, educational materials,  and care plan provided today and declined offer to receive copy of patient instructions, educational materials, and care plan.  ?Telephone follow up appointment with pharmacy team member scheduled for: 6 mont

## 2021-07-14 DIAGNOSIS — R6889 Other general symptoms and signs: Secondary | ICD-10-CM | POA: Diagnosis not present

## 2021-07-20 DIAGNOSIS — I110 Hypertensive heart disease with heart failure: Secondary | ICD-10-CM | POA: Diagnosis not present

## 2021-07-20 DIAGNOSIS — E782 Mixed hyperlipidemia: Secondary | ICD-10-CM | POA: Diagnosis not present

## 2021-07-21 ENCOUNTER — Telehealth: Payer: Self-pay | Admitting: Pharmacist

## 2021-07-21 NOTE — Progress Notes (Signed)
Chronic Care Management Pharmacy Assistant   Name: Jonathon Cain  MRN: [id number] DOB: 1960/07/10   Reason for Encounter: Disease State - General Adherence Call     Recent office visits:  None noted.   Recent consult visits:  None noted.   Hospital visits:  None in previous 6 months  Medications: Outpatient Encounter Medications as of 07/21/2021  Medication Sig   atorvastatin (LIPITOR) 80 MG tablet TAKE ONE TABLET BY MOUTH EVERYDAY AT BEDTIME   cetirizine (ZYRTEC) 10 MG tablet Take 10 mg by mouth daily as needed for allergies.   clopidogrel (PLAVIX) 75 MG tablet TAKE ONE TABLET BY MOUTH EVERY MORNING   diphenoxylate-atropine (LOMOTIL) 2.5-0.025 MG tablet Take 1 tablet by mouth 4 (four) times daily as needed for diarrhea or loose stools.   ENTRESTO 49-51 MG TAKE ONE TABLET BY MOUTH every morning & TAKE ONE TABLET BY MOUTH AT bedtime   fluticasone (FLONASE) 50 MCG/ACT nasal spray Place 2 sprays into both nostrils daily as needed for allergies or rhinitis.    furosemide (LASIX) 40 MG tablet Take 1 tablet (40 mg total) by mouth as needed. Take only if you have more than usual leg swelling   guaiFENesin (MUCINEX) 600 MG 12 hr tablet Take by mouth 2 (two) times daily.   Investigational - Study Medication Study name: EMPACT MI Additional study details: EMPACT-MI: A Streamlined, Multicentre, Randomised, Parallel Group, Double-blind Placebo-controlled Superiority Trial to Evaluate the Effect of EMPAgliflozin on Hospitalisation for Heart Failure and Mortality in Patients With aCuTe Myocardial Infarction   metoprolol succinate (TOPROL-XL) 25 MG 24 hr tablet Take 0.5 tablets (12.5 mg total) by mouth every other day.   spironolactone (ALDACTONE) 25 MG tablet TAKE ONE TABLET BY MOUTH EVERY MORNING   tamsulosin (FLOMAX) 0.4 MG CAPS capsule Take 1 capsule (0.4 mg total) by mouth daily as needed (urine flow).   XARELTO 2.5 MG TABS tablet TAKE ONE TABLET BY MOUTH every morning and TAKE ONE  TABLET BY MOUTH every evening   No facility-administered encounter medications on file as of 07/21/2021.    [place] for General Review Call   Chart Review:  Have there been any documented new, changed, or discontinued medications since last visit? No (If yes, include name, dose, frequency, date) Has there been any documented recent hospitalizations or ED visits since last visit with Clinical Pharmacist? No Brief Summary (including medication and/or Diagnosis changes):   Adherence Review:  Does the Clinical Pharmacist Assistant have access to adherence rates? Yes Adherence rates for STAR metric medications (List medication(s)/day supply/ last 2 fill dates). Adherence rates for medications indicated for disease state being reviewed (List medication(s)/day supply/ last 2 fill dates). Does the patient have >5 day gap between last estimated fill dates for any of the above medications or other medication gaps? No Reason for medication gaps.   Disease State Questions:  Able to connect with Patient? Yes Did patient have any problems with their health recently? No Note problems and Concerns: Have you had any admissions or emergency room visits or worsening of your condition(s) since last visit? No Details of ED visit, hospital visit and/or worsening condition(s): Have you had any visits with new specialists or providers since your last visit? No Explain: Have you had any new health care problem(s) since your last visit? No New problem(s) reported: Have you run out of any of your medications since you last spoke with clinical pharmacist? No What caused you to run out of your medications?  Are there any medications you are not taking as prescribed? No What kept you from taking your medications as prescribed? Are you having any issues or side effects with your medications? No Note of issues or side effects: Do you have any other health concerns or questions you want to  discuss with your Clinical Pharmacist before your next visit? No Note additional concerns and questions from Patient. Are there any health concerns that you feel we can do a better job addressing? No Note Patient's response. Are you having any problems with any of the following since the last visit: (select all that apply)  None  Details: 12. Any falls since last visit? No  Details: 13. Any increased or uncontrolled pain since last visit? No  Details: 14. Next visit Type: office       Visit with: Cardiology        Date: 09/10/21        Time: 11:00 am  15. Additional Details? Yes Patient has been mailed another PAP for Entresto to fill out as income guidelines have been updated and patient meets the income criteria.    Care Gaps   AWV: done 02/20/21 Colonoscopy: done 06/28/28 DM Eye Exam: N/A DM Foot Exam: N/A Microalbumin: unknown HbgAIC: done 07/26/19 (5.5) DEXA: N/A Mammogram: N/A     Star Rating Drugs: Atorvastatin (LIPITOR) 80 MG tablet - last filled 06/04/21 90 days    Future Appointments  Date Time Provider [place]  08/21/2021 10:30 AM [facility]ECHO/VAS 1 [facility] None  08/21/2021 11:15 AM PC[facility]-ECHO/VAS 1 [facility]IMG None  09/10/2021 11:00 AM Patwardhan, Reynold Bowen, MD [facility]PC[facility] None  12/31/2021  3:00 PM [facility] PHARMACIST [facility]  01/19/2022  2:30 PM Lavonna Monarch, MD [facility] [place], [facility] Clinical Pharmacist Assistant  [phone]

## 2021-08-14 DIAGNOSIS — R94112 Abnormal visually evoked potential [VEP]: Secondary | ICD-10-CM | POA: Diagnosis not present

## 2021-08-14 DIAGNOSIS — H5362 Acquired night blindness: Secondary | ICD-10-CM | POA: Diagnosis not present

## 2021-08-14 DIAGNOSIS — H30123 Disseminated chorioretinal inflammation, peripheral, bilateral: Secondary | ICD-10-CM | POA: Diagnosis not present

## 2021-08-14 DIAGNOSIS — H3500 Unspecified background retinopathy: Secondary | ICD-10-CM | POA: Diagnosis not present

## 2021-08-14 DIAGNOSIS — H469 Unspecified optic neuritis: Secondary | ICD-10-CM | POA: Diagnosis not present

## 2021-08-14 DIAGNOSIS — D8989 Other specified disorders involving the immune mechanism, not elsewhere classified: Secondary | ICD-10-CM | POA: Diagnosis not present

## 2021-08-14 DIAGNOSIS — H35373 Puckering of macula, bilateral: Secondary | ICD-10-CM | POA: Diagnosis not present

## 2021-08-14 DIAGNOSIS — H53453 Other localized visual field defect, bilateral: Secondary | ICD-10-CM | POA: Diagnosis not present

## 2021-08-14 DIAGNOSIS — H3552 Pigmentary retinal dystrophy: Secondary | ICD-10-CM | POA: Diagnosis not present

## 2021-08-14 DIAGNOSIS — R6889 Other general symptoms and signs: Secondary | ICD-10-CM | POA: Diagnosis not present

## 2021-08-14 DIAGNOSIS — H43813 Vitreous degeneration, bilateral: Secondary | ICD-10-CM | POA: Diagnosis not present

## 2021-08-14 DIAGNOSIS — R94111 Abnormal electroretinogram [ERG]: Secondary | ICD-10-CM | POA: Diagnosis not present

## 2021-08-21 ENCOUNTER — Ambulatory Visit: Payer: Medicare HMO

## 2021-08-21 DIAGNOSIS — I739 Peripheral vascular disease, unspecified: Secondary | ICD-10-CM

## 2021-08-21 DIAGNOSIS — I5020 Unspecified systolic (congestive) heart failure: Secondary | ICD-10-CM

## 2021-08-23 ENCOUNTER — Other Ambulatory Visit: Payer: Self-pay | Admitting: Cardiology

## 2021-08-23 DIAGNOSIS — I25118 Atherosclerotic heart disease of native coronary artery with other forms of angina pectoris: Secondary | ICD-10-CM

## 2021-08-23 DIAGNOSIS — I739 Peripheral vascular disease, unspecified: Secondary | ICD-10-CM

## 2021-08-26 ENCOUNTER — Other Ambulatory Visit: Payer: Self-pay | Admitting: Cardiology

## 2021-08-26 DIAGNOSIS — I739 Peripheral vascular disease, unspecified: Secondary | ICD-10-CM

## 2021-08-26 DIAGNOSIS — I25118 Atherosclerotic heart disease of native coronary artery with other forms of angina pectoris: Secondary | ICD-10-CM

## 2021-08-27 ENCOUNTER — Telehealth: Payer: Self-pay | Admitting: Pharmacist

## 2021-08-27 NOTE — Progress Notes (Signed)
Chronic Care Management Pharmacy Assistant   Name: Jonathon Cain  MRN: [id number] DOB: Sep 21, 1960   Reason for Encounter: Monthly Medication Coordination Call      Medications: Outpatient Encounter Medications as of 08/27/2021  Medication Sig   atorvastatin (LIPITOR) 80 MG tablet TAKE ONE TABLET BY MOUTH EVERYDAY AT BEDTIME   cetirizine (ZYRTEC) 10 MG tablet Take 10 mg by mouth daily as needed for allergies.   clopidogrel (PLAVIX) 75 MG tablet TAKE ONE TABLET BY MOUTH EVERY MORNING   diphenoxylate-atropine (LOMOTIL) 2.5-0.025 MG tablet Take 1 tablet by mouth 4 (four) times daily as needed for diarrhea or loose stools.   ENTRESTO 49-51 MG TAKE ONE TABLET BY MOUTH every morning & TAKE ONE TABLET BY MOUTH AT bedtime   fluticasone (FLONASE) 50 MCG/ACT nasal spray Place 2 sprays into both nostrils daily as needed for allergies or rhinitis.    furosemide (LASIX) 40 MG tablet Take 1 tablet (40 mg total) by mouth as needed. Take only if you have more than usual leg swelling   guaiFENesin (MUCINEX) 600 MG 12 hr tablet Take by mouth 2 (two) times daily.   Investigational - Study Medication Study name: EMPACT MI Additional study details: EMPACT-MI: A Streamlined, Multicentre, Randomised, Parallel Group, Double-blind Placebo-controlled Superiority Trial to Evaluate the Effect of EMPAgliflozin on Hospitalisation for Heart Failure and Mortality in Patients With aCuTe Myocardial Infarction   metoprolol succinate (TOPROL-XL) 25 MG 24 hr tablet Take 0.5 tablets (12.5 mg total) by mouth every other day.   spironolactone (ALDACTONE) 25 MG tablet TAKE ONE TABLET BY MOUTH EVERY MORNING   tamsulosin (FLOMAX) 0.4 MG CAPS capsule Take 1 capsule (0.4 mg total) by mouth daily as needed (urine flow).   XARELTO 2.5 MG TABS tablet TAKE ONE TABLET BY MOUTH EVERY MORNING and TAKE ONE TABLET BY MOUTH EVERY EVENING   No facility-administered encounter medications on file as of 08/27/2021.   Reviewed chart for  medication changes ahead of medication coordination call.  No OVs, Consults, or hospital visits since last care coordination call/Pharmacist visit. (If appropriate, list visit date, provider name)  No medication changes indicated OR if recent visit, treatment plan here.  BP Readings from Last 3 Encounters:  05/22/21 (!) 103/54  02/21/21 115/71  12/03/20 114/61    Lab Results  Component Value Date   HGBA1C 5.5 07/26/2019     Patient obtains medications through Adherence Packaging  90 Days   Last adherence delivery included: (medication name and frequency) Spirolactone 25 mg 1 tablet daily Entresto 49/51 1 tablet daily Metoprolol ER 25 mg  1/2 tablet every other day Atorvastatin 80 mg 1 tablet daily Clopidogrel 75 mg 1 tablet daily   Xarelto 2.'5mg'$  1 tablet BID Cetrizine 10 mg 1 tablet daily Furosemide 40 mg 1 tablet daily as need (in vial)   Patient is due for next adherence delivery on: 09/08/21. Called patient and reviewed medications and coordinated delivery.  This delivery to include: Spirolactone 25 mg 1 tablet daily Entresto 49/51 1 tablet daily Metoprolol ER 25 mg  1/2 tablet every other day Atorvastatin 80 mg 1 tablet daily Clopidogrel 75 mg 1 tablet daily   Xarelto 2.'5mg'$  1 tablet BID Cetrizine 10 mg 1 tablet daily Furosemide 40 mg 1 tablet daily as need (in vial)  Patient needs refills for: Atorvastatin 80 mg 1 tablet daily and .Clopidogrel 75 mg 1 tablet daily (have been requested from Specialist)  Confirmed delivery date of 09/08/21, advised patient that pharmacy will contact them the  morning of delivery.   Care Gaps   AWV: done 02/20/21 Colonoscopy: done 06/28/28 DM Eye Exam: N/A DM Foot Exam: N/A Microalbumin: unknown HbgAIC: done 07/26/19 (5.5) DEXA: N/A Mammogram: N/A     Star Rating Drugs: Atorvastatin (LIPITOR) 80 MG tablet - last filled 06/04/21 90 days    [name], [facility] Clinical Pharmacist Assistant  [phone]

## 2021-09-03 ENCOUNTER — Other Ambulatory Visit: Payer: Self-pay

## 2021-09-03 DIAGNOSIS — I5020 Unspecified systolic (congestive) heart failure: Secondary | ICD-10-CM

## 2021-09-04 LAB — LIPID PANEL WITH LDL/HDL RATIO
Cholesterol, Total: 97 mg/dL — ABNORMAL LOW (ref 100–199)
HDL: 32 mg/dL — ABNORMAL LOW (ref >39)
LDL Chol Calc (NIH): 45 mg/dL (ref 0–99)
LDL/HDL Ratio: 1.4 ratio (ref 0.0–3.6)
Triglycerides: 105 mg/dL (ref 0–149)
VLDL Cholesterol Cal: 20 mg/dL (ref 5–40)

## 2021-09-04 LAB — BASIC METABOLIC PANEL
BUN/Creatinine Ratio: 11 (ref 10–24)
BUN: 12 mg/dL (ref 8–27)
CO2: 21 mmol/L (ref 20–29)
Calcium: 8.6 mg/dL (ref 8.6–10.2)
Chloride: 104 mmol/L (ref 96–106)
Creatinine, Ser: 1.05 mg/dL (ref 0.76–1.27)
Glucose: 101 mg/dL — ABNORMAL HIGH (ref 70–99)
Potassium: 4.5 mmol/L (ref 3.5–5.2)
Sodium: 138 mmol/L (ref 134–144)
eGFR: 80 mL/min/{1.73_m2} (ref >59)

## 2021-09-04 LAB — PRO B NATRIURETIC PEPTIDE: NT-Pro BNP: 502 pg/mL — ABNORMAL HIGH (ref 0–210)

## 2021-09-10 ENCOUNTER — Ambulatory Visit: Payer: Medicare HMO | Admitting: Cardiology

## 2021-09-10 ENCOUNTER — Encounter: Payer: Self-pay | Admitting: Cardiology

## 2021-09-10 VITALS — BP 104/59 | HR 51 | Temp 97.6°F | Resp 16 | Ht 70.0 in | Wt 290.0 lb

## 2021-09-10 DIAGNOSIS — I25118 Atherosclerotic heart disease of native coronary artery with other forms of angina pectoris: Secondary | ICD-10-CM | POA: Diagnosis not present

## 2021-09-10 DIAGNOSIS — I5020 Unspecified systolic (congestive) heart failure: Secondary | ICD-10-CM

## 2021-09-10 NOTE — Progress Notes (Addendum)
Patient referred by Maximiano Coss, NP for CAD  Subjective:   Jonathon Cain, male    DOB: 06-21-1960, 61 y.o.   MRN: [id number]   Chief Complaint  Patient presents with   heart failure with reduced ejection fraction   Results   Follow-up     HPI  61 y.o. 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 He walks 1/2 mile regularly. He denies any orthopnea, PN, leg edema symptoms. Reviewed recent test results with the patient, details below.    Current Outpatient Medications:    atorvastatin (LIPITOR) 80 MG tablet, TAKE ONE TABLET BY MOUTH EVERYDAY AT BEDTIME, Disp: 103 tablet, Rfl: 0   cetirizine (ZYRTEC) 10 MG tablet, Take 10 mg by mouth daily as needed for allergies., Disp: , Rfl:    clopidogrel (PLAVIX) 75 MG tablet, TAKE ONE TABLET BY MOUTH EVERY MORNING, Disp: 103 tablet, Rfl: 0   diphenoxylate-atropine (LOMOTIL) 2.5-0.025 MG tablet, Take 1 tablet by mouth 4 (four) times daily as needed for diarrhea or loose stools., Disp: 30 tablet, Rfl: 0   ENTRESTO 49-51 MG, TAKE ONE TABLET BY MOUTH every morning & TAKE ONE TABLET BY MOUTH AT bedtime, Disp: 60 tablet, Rfl: 1   fluticasone (FLONASE) 50 MCG/ACT nasal spray, Place 2 sprays into both nostrils daily as needed for allergies or rhinitis. , Disp: , Rfl:    furosemide (LASIX) 40 MG tablet, Take 1 tablet (40 mg total) by mouth as needed. Take only if you have more than usual leg swelling, Disp: 30 tablet, Rfl: 1   guaiFENesin (MUCINEX) 600 MG 12 hr tablet, Take by mouth 2 (two) times daily., Disp: , Rfl:    Investigational - Study Medication, Study name: EMPACT MI  Additional study details: EMPACT-MI: A Streamlined, Multicentre, Randomised, Parallel Group, Double-blind Placebo-controlled Superiority Trial to Evaluate the Effect of EMPAgliflozin on Hospitalisation for Heart Failure and Mortality in Patients With aCuTe Myocardial Infarction, Disp: , Rfl:    metoprolol succinate (TOPROL-XL) 25 MG 24 hr tablet, Take 0.5 tablets (12.5 mg total) by mouth every other day., Disp: 45 tablet, Rfl: 2   spironolactone (ALDACTONE) 25 MG tablet, TAKE ONE TABLET BY MOUTH EVERY MORNING, Disp: 90 tablet, Rfl: 1   tamsulosin (FLOMAX) 0.4 MG CAPS capsule, Take 1 capsule (0.4 mg total) by mouth daily as needed (urine flow)., Disp: 90 capsule, Rfl: 1   XARELTO 2.5 MG TABS tablet, TAKE ONE TABLET BY MOUTH EVERY MORNING and TAKE ONE TABLET BY MOUTH EVERY EVENING, Disp: 180 tablet, Rfl: 1    Cardiovascular and other pertinent studies:  EKG 08/21/2021: Sinus bradycardia 46 bpm First degree A-V block  Left anterior fascicular block   Echocardiogram 08/21/2021:  Left ventricle cavity is normal in size and wall thickness. Severe global  hypokinesis with akinetic apex. LVEF 25-30%. Doppler evidence of grade III  (restrictive) diastolic dysfunction, elevated LAP.  Mild to moderate mitral regurgitation.  Normal right atrial pressure.  Compared to previous study on 09/18/2020, LVEF has further reduced from  around 35%.   ABI 08/21/2021:  This exam reveals normal perfusion of the right lower extremity (ABI  1.08).    This exam reveals normal perfusion of the left  lower extremity (ABI 1.01).   Compared to the study done on 04/17/2020, left ABI has improved from 0.75.    No change in the right.  This represents successful revascularization of  the bilateral SFA by history.  Markedly abnormal monophasic waveform pattern in bilateral posterior  tibial artery. \ Lexiscan Tetrofosmin stress test 09/18/2020: Lexiscan nuclear stress test performed using 1-day protocol. Patient experienced  dyspnea and chest discomfort SPECT images showed large sized, moderate intensity, fixed perfusion defect in mid to apical, anterior, inferior, and apical myocardium. There is severe decrease in myocardial thickening and wall motion. Stress LVEF 12%. High risk study.  Cardiac MRI 02/01/2020: 1. Subendocardial late gadolinium enhancement consistent with prior infarct in LAD territory. There is >50% transmural LGE suggesting nonviability in mid anterior/anteroseptal walls, apical anterior/septal/inferior walls, and apex. 2. Mild LV dilatation with moderate systolic function (EF 69%). Akinesis of mid to apical anterior, mid anteroseptal, apical septal, apical inferior, and apex. 3.  Normal RV size and systolic function (EF 62%) 4.  Moderate mitral regurgitation (regurgitant fraction 24%) 5.  Moderate right pleural effusion   Coronary angiography 11/14/2019: LM: Diffuse 30% disease. IVUS MLA 7.3 mm2,. dFR 1.0 LAD: Patent prox LAD stent with udner expansion        Optimization with 3.5X15 mm [place] balloon at 22 atm        Excellent expansion as seen on IVUS        TIMI flow II-->III        Diag: Mod diffuse disease LCx: 80% ostial OM1 stenosis. dFR 0.98 RCA: Not engaged today  Left Heart Catheterization 07/26/2019 (Dr. Einar Gip):  LM: Mild diffuse disease LAD: Very large caliber vessel, occluded at the proximal end.  Gives origin to high D1 which is large.  Successful PTCA and stenting of the proximal LAD overlapping D1, 3.5 x 24 mm Synergy DES deployed at 12 atmospheric pressure for 60 S. 100% to 0% with TIMI 0 to TIMI-3 flow. LCx: Large vessel, giving origin to a very large OM1 which is secondary branches.  Ostium of OM1 has 80% stenosis. RCA: Mild diffuse disease, distal RCA 15 to 20% stenosis, PDA 50% stenosis.   Recommendation: Patient will need aggressive risk factor modification, dual antiplatelet therapy with aspirin and Brilinta for a year, will be watched carefully in the intensive care  unit for preshock, suspect his EDP is extremely high at the end of the procedure with excessive contrast use.  Will monitor for any contrast nephropathy as well.  Recent labs: 09/03/2021: Glucose 101, BUN/Cr 12/1.05. EGFR 80. Na/K 138/4.5. Chol 97, TG 105, HDL 32, LDL 45 NT-proBNP 502  10/29/2020: Glucose 86, BUN/Cr 12/1.08. EGFR 78. Na/K 141/4.2.  NT pro BNP 733  07/26/2019: Chol 230, TG 95, HDL 36, LDL 175   Review of Systems  Cardiovascular:  Positive for chest pain and dyspnea on exertion. Negative for leg swelling, palpitations and syncope.  Gastrointestinal:  Positive for diarrhea.         Vitals:   09/10/21 1046  BP: (!) 104/59  Pulse: (!) 51  Resp: 16  Temp: 97.6 F (36.4 C)  SpO2: 95%      Body mass index is 41.61 kg/m. Filed Weights   09/10/21 1046  Weight: 290 lb (131.5 kg)       Objective:   Physical Exam Vitals and nursing note reviewed.  Constitutional:      General: He is not in acute distress. Neck:     Vascular: No JVD.  Cardiovascular:  Rate and Rhythm: Normal rate and regular rhythm.     Pulses:          Dorsalis pedis pulses are 1+ on the left side.       Posterior tibial pulses are 0 on the right side and 1+ on the left side.     Heart sounds: Normal heart sounds. No murmur heard. Pulmonary:     Effort: Pulmonary effort is normal.     Breath sounds: Normal breath sounds. No wheezing or rales.  Musculoskeletal:     Right lower leg: No edema.     Left lower leg: No edema.           ICD-10-CM   1. HFrEF (heart failure with reduced ejection fraction) ([place])  I50.20 EKG 12-Lead    Ambulatory referral to Cardiac Electrophysiology    2. Coronary artery disease of native artery of native heart with stable angina pectoris ([place])  I25.118      Orders Placed This Encounter  Procedures   Ambulatory referral to Cardiac Electrophysiology   EKG 12-Lead     Assessment & Recommendations:   61 y.o. 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 He continues to have exertional angina symptoms, raising the possibility of residual ischemia.  He is currently on Entesto to 49-51 mg bid, spironolactone 25 mg daily, metoprolol succinate-prefers to take 12.5 mg every other day. Unable to up titrate GDMT further due to dizziness and low normal BP. QRS is not wide enough to warrant CRT.  The following are areas for potential improvement. Given his angina symptoms, I recommend R/LHC with re-look at large OM. Known 80% stenosis with previously normal RFR. MRI with nonviable distal LAD territory and large infarct. It is prudent to re-evaluate for any residual ischemia that may benefit from revascularization, and offer a chance for improving EF and survival.  In addition, I will also refer him to EP for consideration for Barostim and ICD for primary prevention  Enrolled in EMPACT-MI clinical trial EMPACT-MI: A Streamlined, Multicentre, Randomised, Parallel Group, Double-blind Placebo-controlled Superiority Trial to Evaluate the Effect of EMPAgliflozin on Hospitalisation for Heart Failure and Mortality in Patients With aCuTe Myocardial Infarction  PAD: Successful intervention to b/l CIA and Rt SFA for severe caludication (02/2020) Successful intervention to Lt SFA ABI 0.8 b/l. Residual mild below the knee disease Continue Plavix 75 mg daily, Xarelto 2.5 mg bid  CAD: Recurrent anainga.  Recommend re-look at OM and rest of the coronaries, as above. Continue Plavix 75 mg daily, Xarelto 2.5 mg bid Continue Lipitor 80. LDL down to 64.  Hold Entresto and Xarelto 1 day before cath.  F/u after cath   Nigel Mormon,  MD [facility] Pager: [phone] Office: [phone]

## 2021-09-16 ENCOUNTER — Ambulatory Visit (HOSPITAL_COMMUNITY)
Admission: RE | Admit: 2021-09-16 | Discharge: 2021-09-16 | Disposition: A | Discharge status: Home / Self Care | Payer: Medicare HMO | Attending: Cardiology | Admitting: Cardiology

## 2021-09-16 ENCOUNTER — Other Ambulatory Visit: Payer: Self-pay

## 2021-09-16 ENCOUNTER — Ambulatory Visit (HOSPITAL_COMMUNITY)
Admission: RE | Disposition: A | Discharge status: Home / Self Care | Payer: Self-pay | Source: Home / Self Care | Attending: Cardiology

## 2021-09-16 DIAGNOSIS — E785 Hyperlipidemia, unspecified: Secondary | ICD-10-CM | POA: Diagnosis not present

## 2021-09-16 DIAGNOSIS — I25119 Atherosclerotic heart disease of native coronary artery with unspecified angina pectoris: Secondary | ICD-10-CM | POA: Insufficient documentation

## 2021-09-16 DIAGNOSIS — I739 Peripheral vascular disease, unspecified: Secondary | ICD-10-CM | POA: Insufficient documentation

## 2021-09-16 DIAGNOSIS — I255 Ischemic cardiomyopathy: Secondary | ICD-10-CM | POA: Diagnosis not present

## 2021-09-16 DIAGNOSIS — Z7901 Long term (current) use of anticoagulants: Secondary | ICD-10-CM | POA: Insufficient documentation

## 2021-09-16 DIAGNOSIS — I252 Old myocardial infarction: Secondary | ICD-10-CM | POA: Insufficient documentation

## 2021-09-16 DIAGNOSIS — Z7902 Long term (current) use of antithrombotics/antiplatelets: Secondary | ICD-10-CM | POA: Insufficient documentation

## 2021-09-16 DIAGNOSIS — Z6841 Body Mass Index (BMI) 40.0 and over, adult: Secondary | ICD-10-CM | POA: Diagnosis not present

## 2021-09-16 DIAGNOSIS — H3552 Pigmentary retinal dystrophy: Secondary | ICD-10-CM | POA: Insufficient documentation

## 2021-09-16 DIAGNOSIS — H548 Legal blindness, as defined in USA: Secondary | ICD-10-CM | POA: Insufficient documentation

## 2021-09-16 DIAGNOSIS — F17200 Nicotine dependence, unspecified, uncomplicated: Secondary | ICD-10-CM | POA: Diagnosis not present

## 2021-09-16 DIAGNOSIS — Z79899 Other long term (current) drug therapy: Secondary | ICD-10-CM | POA: Insufficient documentation

## 2021-09-16 DIAGNOSIS — Z87891 Personal history of nicotine dependence: Secondary | ICD-10-CM | POA: Insufficient documentation

## 2021-09-16 DIAGNOSIS — I5020 Unspecified systolic (congestive) heart failure: Secondary | ICD-10-CM | POA: Diagnosis not present

## 2021-09-16 HISTORY — PX: CORONARY PRESSURE/FFR STUDY: CATH118243

## 2021-09-16 HISTORY — PX: RIGHT/LEFT HEART CATH AND CORONARY ANGIOGRAPHY: CATH118266

## 2021-09-16 HISTORY — PX: INTRAVASCULAR PRESSURE WIRE/FFR STUDY: CATH118243

## 2021-09-16 LAB — POCT I-STAT EG7
Acid-Base Excess: 0 mmol/L (ref 0.0–2.0)
Acid-Base Excess: 0 mmol/L (ref 0.0–2.0)
Bicarbonate: 25 mmol/L (ref 20.0–28.0)
Bicarbonate: 25.6 mmol/L (ref 20.0–28.0)
Calcium, Ion: 1.09 mmol/L — ABNORMAL LOW (ref 1.15–1.40)
Calcium, Ion: 1.13 mmol/L — ABNORMAL LOW (ref 1.15–1.40)
HCT: 42 % (ref 39.0–52.0)
HCT: 43 % (ref 39.0–52.0)
Hemoglobin: 14.3 g/dL (ref 13.0–17.0)
Hemoglobin: 14.6 g/dL (ref 13.0–17.0)
O2 Saturation: 67 %
O2 Saturation: 69 %
Potassium: 3.4 mmol/L — ABNORMAL LOW (ref 3.5–5.1)
Potassium: 3.5 mmol/L (ref 3.5–5.1)
Sodium: 140 mmol/L (ref 135–145)
Sodium: 142 mmol/L (ref 135–145)
TCO2: 26 mmol/L (ref 22–32)
TCO2: 27 mmol/L (ref 22–32)
pCO2, Ven: 42.9 mmHg — ABNORMAL LOW (ref 44–60)
pCO2, Ven: 43.6 mmHg — ABNORMAL LOW (ref 44–60)
pH, Ven: 7.373 (ref 7.25–7.43)
pH, Ven: 7.377 (ref 7.25–7.43)
pO2, Ven: 36 mmHg (ref 32–45)
pO2, Ven: 37 mmHg (ref 32–45)

## 2021-09-16 LAB — POCT I-STAT 7, (LYTES, BLD GAS, ICA,H+H)
Acid-base deficit: 2 mmol/L (ref 0.0–2.0)
Bicarbonate: 22.9 mmol/L (ref 20.0–28.0)
Calcium, Ion: 1.08 mmol/L — ABNORMAL LOW (ref 1.15–1.40)
HCT: 41 % (ref 39.0–52.0)
Hemoglobin: 13.9 g/dL (ref 13.0–17.0)
O2 Saturation: 95 %
Potassium: 3.4 mmol/L — ABNORMAL LOW (ref 3.5–5.1)
Sodium: 141 mmol/L (ref 135–145)
TCO2: 24 mmol/L (ref 22–32)
pCO2 arterial: 38.1 mmHg (ref 32–48)
pH, Arterial: 7.387 (ref 7.35–7.45)
pO2, Arterial: 76 mmHg — ABNORMAL LOW (ref 83–108)

## 2021-09-16 LAB — CBC
HCT: 44.7 % (ref 39.0–52.0)
Hemoglobin: 14.7 g/dL (ref 13.0–17.0)
MCH: 29.1 pg (ref 26.0–34.0)
MCHC: 32.9 g/dL (ref 30.0–36.0)
MCV: 88.5 fL (ref 80.0–100.0)
Platelets: 221 10*3/uL (ref 150–400)
RBC: 5.05 MIL/uL (ref 4.22–5.81)
RDW: 14.4 % (ref 11.5–15.5)
WBC: 12.8 10*3/uL — ABNORMAL HIGH (ref 4.0–10.5)
nRBC: 0 % (ref 0.0–0.2)

## 2021-09-16 LAB — POCT ACTIVATED CLOTTING TIME: Activated Clotting Time: 269 seconds

## 2021-09-16 SURGERY — RIGHT/LEFT HEART CATH AND CORONARY ANGIOGRAPHY
Anesthesia: LOCAL

## 2021-09-16 MED ORDER — LABETALOL HCL 5 MG/ML IV SOLN: 10.0000 mg | INTRAVENOUS | Status: DC | PRN

## 2021-09-16 MED ORDER — HYDRALAZINE HCL 20 MG/ML IJ SOLN: 10.0000 mg | INTRAMUSCULAR | Status: DC | PRN

## 2021-09-16 MED ORDER — IOHEXOL 350 MG/ML SOLN
INTRAVENOUS | Status: DC | PRN
  Administered 2021-09-16: 50 mL

## 2021-09-16 MED ORDER — SODIUM CHLORIDE 0.9% FLUSH: 3.0000 mL | Freq: Two times a day (BID) | INTRAVENOUS | Status: DC

## 2021-09-16 MED ORDER — NITROGLYCERIN 1 MG/10 ML FOR IR/CATH LAB
INTRA_ARTERIAL | Status: AC
  Filled 2021-09-16: qty 10

## 2021-09-16 MED ORDER — ASPIRIN 81 MG PO CHEW
81.0000 mg | CHEWABLE_TABLET | ORAL | Status: AC
  Administered 2021-09-16: 81 mg via ORAL
  Filled 2021-09-16: qty 1

## 2021-09-16 MED ORDER — FENTANYL CITRATE (PF) 100 MCG/2ML IJ SOLN
INTRAMUSCULAR | Status: AC
  Filled 2021-09-16: qty 2

## 2021-09-16 MED ORDER — MIDAZOLAM HCL 2 MG/2ML IJ SOLN
INTRAMUSCULAR | Status: DC | PRN
  Administered 2021-09-16: .5 mg via INTRAVENOUS

## 2021-09-16 MED ORDER — SODIUM CHLORIDE 0.9 % IV SOLN: INTRAVENOUS | Status: DC

## 2021-09-16 MED ORDER — MIDAZOLAM HCL 2 MG/2ML IJ SOLN
INTRAMUSCULAR | Status: AC
  Filled 2021-09-16: qty 2

## 2021-09-16 MED ORDER — SODIUM CHLORIDE 0.9% FLUSH: 3.0000 mL | INTRAVENOUS | Status: DC | PRN

## 2021-09-16 MED ORDER — ACETAMINOPHEN 325 MG PO TABS: 650.0000 mg | ORAL_TABLET | ORAL | Status: DC | PRN

## 2021-09-16 MED ORDER — NITROGLYCERIN 1 MG/10 ML FOR IR/CATH LAB
INTRA_ARTERIAL | Status: DC | PRN
  Administered 2021-09-16: 200 ug via INTRACORONARY

## 2021-09-16 MED ORDER — HEPARIN SODIUM (PORCINE) 1000 UNIT/ML IJ SOLN
INTRAMUSCULAR | Status: DC | PRN
  Administered 2021-09-16: 5000 [IU] via INTRAVENOUS
  Administered 2021-09-16: 6000 [IU] via INTRAVENOUS

## 2021-09-16 MED ORDER — HEPARIN SODIUM (PORCINE) 1000 UNIT/ML IJ SOLN
INTRAMUSCULAR | Status: AC
  Filled 2021-09-16: qty 10

## 2021-09-16 MED ORDER — VERAPAMIL HCL 2.5 MG/ML IV SOLN
INTRAVENOUS | Status: DC | PRN
  Administered 2021-09-16: 10 mL via INTRA_ARTERIAL

## 2021-09-16 MED ORDER — HEPARIN (PORCINE) IN NACL 1000-0.9 UT/500ML-% IV SOLN
INTRAVENOUS | Status: AC
Start: 2021-09-16 — End: ?
  Filled 2021-09-16: qty 1000

## 2021-09-16 MED ORDER — SODIUM CHLORIDE 0.9 % IV SOLN: 250.0000 mL | INTRAVENOUS | Status: DC | PRN

## 2021-09-16 MED ORDER — SODIUM CHLORIDE 0.9% FLUSH
3.0000 mL | Freq: Two times a day (BID) | INTRAVENOUS | Status: DC
Start: 2021-09-16 — End: 2021-09-16

## 2021-09-16 MED ORDER — VERAPAMIL HCL 2.5 MG/ML IV SOLN
INTRAVENOUS | Status: AC
Start: 2021-09-16 — End: ?
  Filled 2021-09-16: qty 2

## 2021-09-16 MED ORDER — CLOPIDOGREL BISULFATE 75 MG PO TABS: 75.0000 mg | ORAL_TABLET | Freq: Once | ORAL | Status: DC

## 2021-09-16 MED ORDER — ONDANSETRON HCL 4 MG/2ML IJ SOLN: 4.0000 mg | Freq: Four times a day (QID) | INTRAMUSCULAR | Status: DC | PRN

## 2021-09-16 MED ORDER — SODIUM CHLORIDE 0.9% FLUSH
3.0000 mL | INTRAVENOUS | Status: DC | PRN
Start: 2021-09-16 — End: 2021-09-16

## 2021-09-16 MED ORDER — HEPARIN (PORCINE) IN NACL 1000-0.9 UT/500ML-% IV SOLN
INTRAVENOUS | Status: DC | PRN
  Administered 2021-09-16 (×2): 500 mL

## 2021-09-16 MED ORDER — SODIUM CHLORIDE 0.9 % IV SOLN
250.0000 mL | INTRAVENOUS | Status: DC | PRN
Start: 2021-09-16 — End: 2021-09-16

## 2021-09-16 MED ORDER — FENTANYL CITRATE (PF) 100 MCG/2ML IJ SOLN
INTRAMUSCULAR | Status: DC | PRN
  Administered 2021-09-16: 12.5 ug via INTRAVENOUS

## 2021-09-16 MED ORDER — LIDOCAINE HCL (PF) 1 % IJ SOLN
INTRAMUSCULAR | Status: AC
Start: 2021-09-16 — End: ?
  Filled 2021-09-16: qty 30

## 2021-09-16 SURGICAL SUPPLY — 16 items
BAND CMPR LRG ZPHR (HEMOSTASIS) ×1
BAND ZEPHYR COMPRESS 30 LONG (HEMOSTASIS) ×1 IMPLANT
CATH BALLN WEDGE 5F 110CM (CATHETERS) ×1 IMPLANT
CATH INFINITI JR4 5F (CATHETERS) ×1 IMPLANT
CATH LAUNCHER 6FR EBU3.5 (CATHETERS) ×1 IMPLANT
GLIDESHEATH SLEND SS 6F .021 (SHEATH) ×1 IMPLANT
GUIDEWIRE .025 260CM (WIRE) ×1 IMPLANT
GUIDEWIRE INQWIRE 1.5J.035X260 (WIRE) IMPLANT
GUIDEWIRE PRESSURE X 175 (WIRE) ×1 IMPLANT
INQWIRE 1.5J .035X260CM (WIRE) ×2
KIT ENCORE 26 ADVANTAGE (KITS) ×1 IMPLANT
KIT HEART LEFT (KITS) ×3 IMPLANT
PACK CARDIAC CATHETERIZATION (CUSTOM PROCEDURE TRAY) ×3 IMPLANT
SHEATH GLIDE SLENDER 4/5FR (SHEATH) ×1 IMPLANT
TRANSDUCER W/STOPCOCK (MISCELLANEOUS) ×3 IMPLANT
TUBING CIL FLEX 10 FLL-RA (TUBING) ×4 IMPLANT

## 2021-09-17 ENCOUNTER — Encounter (HOSPITAL_COMMUNITY): Payer: Self-pay | Admitting: Cardiology

## 2021-09-17 MED FILL — Lidocaine HCl Local Preservative Free (PF) Inj 1%: INTRAMUSCULAR | Qty: 30 | Status: AC

## 2021-10-01 NOTE — Progress Notes (Signed)
ELECTROPHYSIOLOGY CONSULT NOTE  Patient ID: Jonathon Cain, MRN: [id number], DOB/AGE: 1960/10/20 61 y.o. Admit date: (Not on file) Date of Consult: 10/02/2021  Primary Physician: Maximiano Coss, NP Primary Cardiologist: MPAt     Jonathon Cain is a 61 y.o. male who is being seen today for the evaluation of ICD at the request of MPat.    HPI Jonathon Cain is a 62 y.o. male w CAD  and prior STEMI-LAD Rx PCI 5/21, residual nonobstructive disease on the referred for consideration of ICD for primary prevention  The patient denies chest pain, nocturnal dyspnea, orthopnea or peripheral edema.  There have been no lightheadedness or syncope.  Complains of dyspnea on exertion.  He is able to walk 500 steps at about minutes, this would probably translate into about 250 m 6-minute hall walk.  No edema.  Some palpitations.  Relatively brief, not sure whether they are irregular or rapid.  Some lightheadedness.[name] Kitchen    QRSw 118 msec (6/23) DATE TEST EF   8/21 LHC    % LAD stent reexpanded OM1 80   11/21 cMRI  36 % LAD SCAR MR mod  6/23 Echo  25%    Date Cr K Hgb  6/23 1.05 3.4<<3.5 14.3            Past Medical History:  Diagnosis Date   Allergy    Basal cell carcinoma 09/03/2020   sup & nod- Left upper arm-posterior ([id number])   Bladder outlet obstruction    Cancer (HCC)    Skin   CHF (congestive heart failure) ([place])    Coronary artery disease    Hyperlipidemia    Hypertension    Melanoma ([place]) 09/03/2020   in situ- Left upper back (EXC)   Retinitis pigmentosa       Surgical History:  Past Surgical History:  Procedure Laterality Date   ABDOMINAL AORTOGRAM W/LOWER EXTREMITY N/A 03/19/2020   Procedure: ABDOMINAL AORTOGRAM W/LOWER EXTREMITY;  Surgeon: Nigel Mormon, MD;  Location: [place] [facility];  Service: Cardiovascular;  Laterality: N/A;   CARDIAC CATHETERIZATION     CATARACT EXTRACTION Bilateral 2004   CORONARY BALLOON ANGIOPLASTY N/A 11/14/2019    Procedure: CORONARY BALLOON ANGIOPLASTY;  Surgeon: Nigel Mormon, MD;  Location: [place] [facility];  Service: Cardiovascular;  Laterality: N/A;   CORONARY STENT INTERVENTION N/A 07/26/2019   Procedure: CORONARY STENT INTERVENTION;  Surgeon: Adrian Prows, MD;  Location: [place] [facility];  Service: Cardiovascular;  Laterality: N/A;   CORONARY/GRAFT ACUTE MI REVASCULARIZATION N/A 07/26/2019   Procedure: CORONARY/GRAFT ACUTE MI REVASCULARIZATION;  Surgeon: Adrian Prows, MD;  Location: [place] [facility];  Service: Cardiovascular;  Laterality: N/A;   EYE SURGERY     INTRAVASCULAR PRESSURE WIRE/FFR STUDY N/A 11/14/2019   Procedure: INTRAVASCULAR PRESSURE WIRE/FFR STUDY;  Surgeon: Nigel Mormon, MD;  Location: [place] [facility];  Service: Cardiovascular;  Laterality: N/A;   INTRAVASCULAR PRESSURE WIRE/FFR STUDY N/A 09/16/2021   Procedure: INTRAVASCULAR PRESSURE WIRE/FFR STUDY;  Surgeon: Nigel Mormon, MD;  Location: [place] [facility];  Service: Cardiovascular;  Laterality: N/A;   INTRAVASCULAR ULTRASOUND/IVUS N/A 11/14/2019   Procedure: Intravascular Ultrasound/IVUS;  Surgeon: Nigel Mormon, MD;  Location: [place] [facility];  Service: Cardiovascular;  Laterality: N/A;   LEFT HEART CATH AND CORONARY ANGIOGRAPHY N/A 07/26/2019   Procedure: LEFT HEART CATH AND CORONARY ANGIOGRAPHY;  Surgeon: Adrian Prows, MD;  Location: [place] [facility];  Service: Cardiovascular;  Laterality: N/A;  LOWER EXTREMITY ANGIOGRAPHY Left 05/21/2020   Procedure: LOWER EXTREMITY ANGIOGRAPHY;  Surgeon: Nigel Mormon, MD;  Location: [place] [facility];  Service: Cardiovascular;  Laterality: Left;   PERIPHERAL VASCULAR BALLOON ANGIOPLASTY Right 03/19/2020   Procedure: PERIPHERAL VASCULAR BALLOON ANGIOPLASTY;  Surgeon: Nigel Mormon, MD;  Location: [place] [facility];  Service: Cardiovascular;  Laterality: Right;  SFA   PERIPHERAL VASCULAR BALLOON ANGIOPLASTY  05/21/2020   Procedure: PERIPHERAL VASCULAR  BALLOON ANGIOPLASTY;  Surgeon: Nigel Mormon, MD;  Location: [place] [facility];  Service: Cardiovascular;;  left SFA   PERIPHERAL VASCULAR INTERVENTION Bilateral 03/19/2020   Procedure: PERIPHERAL VASCULAR INTERVENTION;  Surgeon: Nigel Mormon, MD;  Location: [place] [facility];  Service: Cardiovascular;  Laterality: Bilateral;  External Iliac   RIGHT/LEFT HEART CATH AND CORONARY ANGIOGRAPHY N/A 09/16/2021   Procedure: RIGHT/LEFT HEART CATH AND CORONARY ANGIOGRAPHY;  Surgeon: Nigel Mormon, MD;  Location: [place] [facility];  Service: Cardiovascular;  Laterality: N/A;   TUMOR REMOVAL  1961   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     Home Meds: Current Meds  Medication Sig   atorvastatin (LIPITOR) 80 MG tablet TAKE ONE TABLET BY MOUTH EVERYDAY AT BEDTIME   cetirizine (ZYRTEC) 10 MG tablet Take 10 mg by mouth daily as needed for allergies.   clopidogrel (PLAVIX) 75 MG tablet TAKE ONE TABLET BY MOUTH EVERY MORNING   diphenoxylate-atropine (LOMOTIL) 2.5-0.025 MG tablet Take 1 tablet by mouth 4 (four) times daily as needed for diarrhea or loose stools.   ENTRESTO 49-51 MG TAKE ONE TABLET BY MOUTH every morning & TAKE ONE TABLET BY MOUTH AT bedtime   fluticasone (FLONASE) 50 MCG/ACT nasal spray Place 2 sprays into both nostrils daily as needed for allergies or rhinitis.    furosemide (LASIX) 40 MG tablet Take 1 tablet (40 mg total) by mouth as needed. Take only if you have more than usual leg swelling   guaiFENesin (MUCINEX) 600 MG 12 hr tablet Take 600 mg by mouth 2 (two) times daily as needed (congestion/cough).   Investigational - Study Medication Study name: EMPACT MI Additional study details: EMPACT-MI: A Streamlined, Multicentre, Randomised, Parallel Group, Double-blind Placebo-controlled Superiority Trial to Evaluate the Effect of EMPAgliflozin on Hospitalisation for Heart Failure and Mortality in Patients With aCuTe Myocardial Infarction   isosorbide mononitrate (IMDUR)  30 MG 24 hr tablet Take 0.5 tablets (15 mg total) by mouth daily.   metoprolol succinate (TOPROL-XL) 25 MG 24 hr tablet Take 0.5 tablets (12.5 mg total) by mouth every other day.   spironolactone (ALDACTONE) 25 MG tablet TAKE ONE TABLET BY MOUTH EVERY MORNING   tamsulosin (FLOMAX) 0.4 MG CAPS capsule Take 1 capsule (0.4 mg total) by mouth daily as needed (urine flow).   XARELTO 2.5 MG TABS tablet TAKE ONE TABLET BY MOUTH EVERY MORNING and TAKE ONE TABLET BY MOUTH EVERY EVENING    Allergies: No Known Allergies  Social History   Socioeconomic History   Marital status: Single    Spouse name: Not on file   Number of children: 0   Years of education: 16   Highest education level: Bachelor's degree (e.g., BA, AB, BS)  Occupational History   Not on file  Tobacco Use   Smoking status: Former    Packs/day: 1.50    Years: 40.00    Total pack years: 60.00    Types: Cigarettes    Quit date: 07/26/2019    Years since quitting: 2.1   Smokeless tobacco: Never  Vaping Use   Vaping Use: Never used  Substance and Sexual Activity   Alcohol use: Not Currently    Comment: occasional   Drug use: No   Sexual activity: Not Currently  Other Topics Concern   Not on file  Social History Narrative   Not on file   Social Determinants of Health   Financial Resource Strain: Low Risk  (02/20/2021)   Overall Financial Resource Strain (CARDIA)    Difficulty of Paying Living Expenses: Not hard at all  Food Insecurity: No Food Insecurity (02/20/2021)   Hunger Vital Sign    Worried About Running Out of Food in the Last Year: Never true    Ran Out of Food in the Last Year: Never true  Transportation Needs: No Transportation Needs (02/20/2021)   PRAPARE - Hydrologist (Medical): No    Lack of Transportation (Non-Medical): No  Physical Activity: Inactive (02/20/2021)   Exercise Vital Sign    Days of Exercise per Week: 0 days    Minutes of Exercise per Session: 0 min  Stress: No  Stress Concern Present (02/20/2021)   [place]    Feeling of Stress : Not at all  Social Connections: Socially Isolated (02/20/2021)   Social Connection and Isolation Panel [NHANES]    Frequency of Communication with Friends and Family: Twice a week    Frequency of Social Gatherings with Friends and Family: Twice a week    Attends Religious Services: Never    Marine scientist or Organizations: No    Attends Archivist Meetings: Never    Marital Status: Never married  Intimate Partner Violence: Not At Risk (02/20/2021)   Humiliation, Afraid, Rape, and Kick questionnaire    Fear of Current or Ex-Partner: No    Emotionally Abused: No    Physically Abused: No    Sexually Abused: No     Family History  Problem Relation Age of Onset   Heart disease Mother    Hyperlipidemia Mother    Asthma Mother    Heart disease Father    Hypertension Father    Heart disease Maternal Grandmother    Heart disease Maternal Grandfather    Heart disease Paternal Grandmother    Heart disease Paternal Grandfather      ROS:  Please see the history of present illness.     All other systems reviewed and negative.    Physical Exam:  Blood pressure (!) 104/58, pulse 75, height '5\' 10"'$  (1.778 m), weight 292 lb (132.5 kg), SpO2 95 %. General: Well developed, Morbidly obese  male in no acute distress. Head: Normocephalic, atraumatic, sclera non-icteric, no xanthomas, nares are without discharge. EENT: normal  Lymph Nodes:  none Neck: Negative for carotid bruits. JVD not elevated. Back:without scoliosis kyphosis  Lungs: Clear bilaterally to auscultation without wheezes, rales, or rhonchi. Breathing is unlabored. Heart: RRR with S1 S2. No  /6 systolic murmur . No rubs, or gallops appreciated. Abdomen: Soft, non-tender, non-distended with normoactive bowel sounds. No hepatomegaly. No rebound/guarding. No obvious abdominal  masses. Msk:  Strength and tone appear normal for age. Extremities: No clubbing or cyanosis. edema.  Distal pedal pulses are 2+ and equal bilaterally. Skin: Warm and Dry Neuro: Alert and oriented X 3. CN III-XII intact Grossly normal sensory and motor function . Psych:  Responds to questions appropriately with a normal affect.        EKG: Sinus at 75 Intervals 20/11/40  Left axis -82   Assessment and Plan:  Ischemic cardiomyopathy status post anterior wall MI  QRSd 110 ms  Congestive heart failure-chronic-systolic class IIIb  Leukocytosis  Hypokalemia    The patient has persistent left ventricular dysfunction in the setting of an anterior wall MI.  He is appropriately considered for an ICD for primary prevention for sudden cardiac death; however, while we reviewed the potential benefits they did not include quality of life improvement and as such at this juncture he is not interested in pursuing this.  We did discuss the potential role of Baro-Stim to help with his heart failure.  I will have his information reviewed by the Baro-Stim team  He was hypokalemic at his last office check, we will check it again.  He also has persistent leukocytosis albeit with neutrophilia.  I suggested he follows up with his primary care physician.       Virl Axe

## 2021-10-02 ENCOUNTER — Encounter: Payer: Self-pay | Admitting: Cardiology

## 2021-10-02 ENCOUNTER — Encounter: Payer: Self-pay | Admitting: Internal Medicine

## 2021-10-02 ENCOUNTER — Ambulatory Visit: Payer: Medicare HMO | Admitting: Cardiology

## 2021-10-02 ENCOUNTER — Ambulatory Visit: Payer: Medicare HMO | Admitting: Internal Medicine

## 2021-10-02 VITALS — BP 104/58 | HR 75 | Ht 70.0 in | Wt 292.0 lb

## 2021-10-02 VITALS — BP 110/59 | HR 64 | Temp 98.0°F | Resp 16 | Ht 70.0 in | Wt 290.0 lb

## 2021-10-02 DIAGNOSIS — I25118 Atherosclerotic heart disease of native coronary artery with other forms of angina pectoris: Secondary | ICD-10-CM | POA: Diagnosis not present

## 2021-10-02 DIAGNOSIS — E876 Hypokalemia: Secondary | ICD-10-CM

## 2021-10-02 DIAGNOSIS — I5020 Unspecified systolic (congestive) heart failure: Secondary | ICD-10-CM

## 2021-10-02 DIAGNOSIS — I739 Peripheral vascular disease, unspecified: Secondary | ICD-10-CM

## 2021-10-02 MED ORDER — ISOSORBIDE MONONITRATE ER 30 MG PO TB24: 15.0000 mg | ORAL_TABLET | Freq: Every day | ORAL | 3 refills | Status: DC

## 2021-10-02 MED ORDER — EMPAGLIFLOZIN 10 MG PO TABS: 10.0000 mg | ORAL_TABLET | Freq: Every day | ORAL | 3 refills | Status: DC

## 2021-10-02 NOTE — Progress Notes (Signed)
Patient referred by Maximiano Coss, NP for CAD  Subjective:   Jonathon Cain, male    DOB: [birth year], 61 y.o.   MRN: [id number]   No chief complaint on file.    HPI  61 y.o. 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  Patient continues to have exertional anginal symptoms.  Separate note, he has appointment to see Dr. Caryl Comes today to discuss whether be candidate for baricitinib therapy, as well as primary prevention ICD.   Current Outpatient Medications:    atorvastatin (LIPITOR) 80 MG tablet, TAKE ONE TABLET BY MOUTH EVERYDAY AT BEDTIME, Disp: 103 tablet, Rfl: 0   cetirizine (ZYRTEC) 10 MG tablet, Take 10 mg by mouth daily as needed for allergies., Disp: , Rfl:    clopidogrel (PLAVIX) 75 MG tablet, TAKE ONE TABLET BY MOUTH EVERY MORNING, Disp: 103 tablet, Rfl: 0   diphenoxylate-atropine (LOMOTIL) 2.5-0.025 MG tablet, Take 1 tablet by mouth 4 (four) times daily as needed for diarrhea or loose stools., Disp: 30 tablet, Rfl: 0   ENTRESTO 49-51 MG, TAKE ONE TABLET BY MOUTH every morning & TAKE ONE TABLET BY MOUTH AT bedtime, Disp: 60 tablet, Rfl: 1   fluticasone (FLONASE) 50 MCG/ACT nasal spray, Place 2 sprays into both nostrils daily as needed for allergies or rhinitis. , Disp: , Rfl:    furosemide (LASIX) 40 MG tablet, Take 1 tablet (40 mg total) by mouth as needed. Take only if you have more than usual leg swelling, Disp: 30 tablet, Rfl: 1   guaiFENesin (MUCINEX) 600 MG 12 hr tablet, Take 600 mg by mouth 2 (two) times daily as needed (congestion/cough)., Disp: , Rfl:    Investigational - Study Medication, Study name: EMPACT MI Additional study details:  EMPACT-MI: A Streamlined, Multicentre, Randomised, Parallel Group, Double-blind Placebo-controlled Superiority Trial to Evaluate the Effect of EMPAgliflozin on Hospitalisation for Heart Failure and Mortality in Patients With aCuTe Myocardial Infarction, Disp: , Rfl:    metoprolol succinate (TOPROL-XL) 25 MG 24 hr tablet, Take 0.5 tablets (12.5 mg total) by mouth every other day., Disp: 45 tablet, Rfl: 2   spironolactone (ALDACTONE) 25 MG tablet, TAKE ONE TABLET BY MOUTH EVERY MORNING, Disp: 90 tablet, Rfl: 1   tamsulosin (FLOMAX) 0.4 MG CAPS capsule, Take 1 capsule (0.4 mg total) by mouth daily as needed (urine flow)., Disp: 90 capsule, Rfl: 1   XARELTO 2.5 MG TABS tablet, TAKE ONE TABLET BY MOUTH EVERY MORNING and TAKE ONE TABLET BY MOUTH EVERY EVENING, Disp: 180 tablet, Rfl: 1    Cardiovascular and other pertinent studies:  RHC/LHC 09/16/2021: LM: Normal LAD: Patent prox LAD stent with 40% late lumen loss         RFR 0.94 (physiologically non-significant) Lcx: OM1 ostial 50% stenosis (Improved from previous cath in 2021-80% with negative RFR then) RCA: Distal 20%, ostial PDA 50% stenoses   LV: 121/11 mmHg LVEDP: 23 mmHg   RA: 10 mmHg RV: 78/4 mmHg PA: 72/23 mmHg, mPAP 40 mmHg PCW: 24 mmHg   CO: 6.0 L/min CI: 2.4 L/min/m2   No indication for revascularization. Continue optimization of GDMT for HFrEF  EKG 08/21/2021: Sinus bradycardia 46 bpm First degree A-V block  Left anterior fascicular block   Echocardiogram 08/21/2021:  Left ventricle cavity is normal in size and wall thickness. Severe global  hypokinesis with akinetic apex. LVEF 25-30%. Doppler evidence of grade III  (restrictive) diastolic dysfunction, elevated LAP.  Mild to moderate mitral regurgitation.  Normal right atrial pressure.  Compared to previous study on 09/18/2020, LVEF has further reduced from  around 35%.   ABI 08/21/2021:  This exam reveals normal perfusion of the right lower extremity (ABI  1.08).     This exam reveals normal perfusion of the left lower extremity (ABI 1.01).   Compared to the study done on 04/17/2020, left ABI has improved from 0.75.    No change in the right.  This represents successful revascularization of  the bilateral SFA by history.  Markedly abnormal monophasic waveform pattern in bilateral posterior  tibial artery.  Lexiscan Tetrofosmin stress test 09/18/2020: Lexiscan nuclear stress test performed using 1-day protocol. Patient experienced dyspnea and chest discomfort SPECT images showed large sized, moderate intensity, fixed perfusion defect in mid to apical, anterior, inferior, and apical myocardium. There is severe decrease in myocardial thickening and wall motion. Stress LVEF 12%. High risk study.  Cardiac MRI 02/01/2020: 1. Subendocardial late gadolinium enhancement consistent with prior infarct in LAD territory. There is >50% transmural LGE suggesting nonviability in mid anterior/anteroseptal walls, apical anterior/septal/inferior walls, and apex. 2. Mild LV dilatation with moderate systolic function (EF 93%). Akinesis of mid to apical anterior, mid anteroseptal, apical septal, apical inferior, and apex. 3.  Normal RV size and systolic function (EF 26%) 4.  Moderate mitral regurgitation (regurgitant fraction 24%) 5.  Moderate right pleural effusion   Coronary angiography 11/14/2019: LM: Diffuse 30% disease. IVUS MLA 7.3 mm2,. dFR 1.0 LAD: Patent prox LAD stent with udner expansion        Optimization with 3.5X15 mm Worthington balloon at 22 atm        Excellent expansion as seen on IVUS        TIMI flow II-->III        Diag: Mod diffuse disease LCx: 80% ostial OM1 stenosis. dFR 0.98 RCA: Not engaged today  Left Heart Catheterization 07/26/2019 (Dr. Einar Gip):  LM: Mild diffuse disease LAD: Very large caliber vessel, occluded at the proximal end.  Gives origin to high D1 which is large.  Successful PTCA and stenting of the proximal LAD overlapping D1, 3.5 x 24  mm Synergy DES deployed at 12 atmospheric pressure for 60 S. 100% to 0% with TIMI 0 to TIMI-3 flow. LCx: Large vessel, giving origin to a very large OM1 which is secondary branches.  Ostium of OM1 has 80% stenosis. RCA: Mild diffuse disease, distal RCA 15 to 20% stenosis, PDA 50% stenosis.   Recommendation: Patient will need aggressive risk factor modification, dual antiplatelet therapy with aspirin and Brilinta for a year, will be watched carefully in the intensive care unit for preshock, suspect his EDP is extremely high at the end of the procedure with excessive contrast use.  Will monitor for any contrast nephropathy as well.  Recent labs: 09/03/2021: Glucose 101, BUN/Cr 12/1.05. EGFR 80. Na/K 138/4.5. Chol 97, TG 105, HDL 32, LDL 45 NT-proBNP 502  10/29/2020: Glucose 86, BUN/Cr 12/1.08. EGFR 78. Na/K 141/4.2.  NT pro BNP 733  07/26/2019: Chol 230, TG 95, HDL 36, LDL 175   Review of Systems  Cardiovascular:  Positive for chest pain and dyspnea on exertion. Negative for leg swelling, palpitations and syncope.  Gastrointestinal:  Positive for diarrhea.         Vitals:   10/02/21  1122  BP: (!) 110/59  Pulse: 64  Resp: 16  Temp: 98 F (36.7 C)  SpO2: 98%      Body mass index is 41.61 kg/m. Filed Weights   10/02/21 1122  Weight: 290 lb (131.5 kg)       Objective:   Physical Exam Vitals and nursing note reviewed.  Constitutional:      General: He is not in acute distress. Neck:     Vascular: No JVD.  Cardiovascular:     Rate and Rhythm: Normal rate and regular rhythm.     Pulses:          Dorsalis pedis pulses are 1+ on the left side.       Posterior tibial pulses are 0 on the right side and 1+ on the left side.     Heart sounds: Normal heart sounds. No murmur heard. Pulmonary:     Effort: Pulmonary effort is normal.     Breath sounds: Normal breath sounds. No wheezing or rales.  Musculoskeletal:     Right lower leg: No edema.     Left lower leg: No edema.            ICD-10-CM   1. HFrEF (heart failure with reduced ejection fraction) (HCC)  I50.20     2. Coronary artery disease of native artery of native heart with stable angina pectoris ([place])  I25.118     3. Peripheral arterial disease (HCC)  I73.9      No orders of the defined types were placed in this encounter.    Assessment & Recommendations:   61 y.o. 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 He is currently on Entesto to 49-51 mg bid, spironolactone 25 mg daily, metoprolol succinate-prefers to take 12.5 mg every other day.  Unable to up titrate GDMT further due to dizziness and low normal BP. QRS is not wide enough to warrant CRT.  Patient is enrolled in back to my clinical trial for post MI heart failure.  However, patient is now 2 years post MI, and continues to have rather chronic HFrEF, with recent decrease in EF.  I would like to discontinue his study medication, and start him on Jardiance 10 mg daily for guideline directed management of heart failure.  He will stop the statin medication, and start Jardiance after 7 days of washout period.  Encourage liberal hydration of at least 2-3 L of fluid after addition of Jardiance.  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  [facility].  After addition of Jardiance and Imdur, [facility] may have to redo his pill packs going forward.  Seeing EP today for consideration for Barostim and ICD for primary prevention  PAD: Successful intervention to b/l CIA and Rt SFA for severe caludication (02/2020) Successful intervention  to Lt SFA ABI 0.8 b/l. Residual mild below the knee disease Continue Plavix 75 mg daily, Xarelto 2.5 mg bid  CAD: Nonobstructive CAD, patent LAD stent (08/2021) However, he continues to have anginal symptoms.   I have added Imdur 15 mg daily.   Continue Plavix 75 mg daily, Xarelto 2.5 mg bid Continue Lipitor 80. LDL down to 64.  F/u in 2 months  Jonathon Cain  Esther Hardy, MD [facility]. [facility] Pager: [phone] Office: [phone]

## 2021-10-02 NOTE — Patient Instructions (Addendum)
Medication Instructions:  Your physician recommends that you continue on your current medications as directed. Please refer to the Current Medication list given to you today.  *If you need a refill on your cardiac medications before your next appointment, please call your pharmacy*   Lab Work: BMET today  If you have labs (blood work) drawn today and your tests are completely normal, you will receive your results only by: [place] (if you have MyChart) OR A paper copy in the mail If you have any lab test that is abnormal or we need to change your treatment, we will call you to review the results.   Testing/Procedures: None ordered.    Follow-Up: At [facility], you and your health needs are our priority.  As part of our continuing mission to provide you with exceptional heart care, we have created designated Provider Care Teams.  These Care Teams include your primary Cardiologist (physician) and Advanced Practice Providers (APPs -  Physician Assistants and Nurse Practitioners) who all work together to provide you with the care you need, when you need it.  We recommend signing up for the patient portal called "MyChart".  Sign up information is provided on this After Visit Summary.  MyChart is used to connect with patients for Virtual Visits (Telemedicine).  Patients are able to view lab/test results, encounter notes, upcoming appointments, etc.  Non-urgent messages can be sent to your provider as well.   To learn more about what you can do with MyChart, go to [email or web address].    Your next appointment:   Follow up as needed with Dr Klein:1}    Other Instructions Dr Caryl Comes will discuss Barostim with Oda Kilts, PA-C  Important Information About Sugar

## 2021-10-02 NOTE — Addendum Note (Signed)
Addended by: Nigel Mormon on: 10/02/2021 03:33 PM   Modules accepted: Orders

## 2021-10-03 ENCOUNTER — Institutional Professional Consult (permissible substitution): Payer: Medicare HMO | Admitting: Internal Medicine

## 2021-10-03 LAB — BASIC METABOLIC PANEL
BUN/Creatinine Ratio: 14 (ref 10–24)
BUN: 13 mg/dL (ref 8–27)
CO2: 18 mmol/L — ABNORMAL LOW (ref 20–29)
Calcium: 8.5 mg/dL — ABNORMAL LOW (ref 8.6–10.2)
Chloride: 105 mmol/L (ref 96–106)
Creatinine, Ser: 0.94 mg/dL (ref 0.76–1.27)
Glucose: 116 mg/dL — ABNORMAL HIGH (ref 70–99)
Potassium: 4.3 mmol/L (ref 3.5–5.2)
Sodium: 138 mmol/L (ref 134–144)
eGFR: 91 mL/min/{1.73_m2} (ref >59)

## 2021-10-23 ENCOUNTER — Telehealth: Payer: Self-pay | Admitting: Pharmacist

## 2021-10-23 NOTE — Progress Notes (Signed)
Chronic Care Management Pharmacy Assistant   Name: Jonathon Cain  MRN: [id number] DOB: Aug 20, 1960   Reason for Encounter: Disease State - Hypertension Call     Recent office visits:  None noted  Recent consult visits:  10/02/21 Virl Axe MD - Cardiology - Heart Failure - EKG and labs performed.Follow up as needed.   09/10/21 Vernell Leep MD - Cardiology - EKG performed. Referral to Cardiac Electrophysiology. Cardiac Cath scheduled. Follow up after procedure   Hospital visits: 09/16/21 Medication Reconciliation was completed by comparing discharge summary, patient's EMR and Pharmacy list, and upon discussion with patient.  Admitted to the hospital on 09/16/21 due to Cardiac Cath. Discharge date was 09/16/21. Discharged from [facility]?Medications Started at [facility] Discharge:?? None noted  Medication Changes at Hospital Discharge: None noted  Medications Discontinued at Hospital Discharge: None noted  Medications that remain the same after Hospital Discharge:??  All other medications will remain the same.    Medications: Outpatient Encounter Medications as of 10/23/2021  Medication Sig   atorvastatin (LIPITOR) 80 MG tablet TAKE ONE TABLET BY MOUTH EVERYDAY AT BEDTIME   cetirizine (ZYRTEC) 10 MG tablet Take 10 mg by mouth daily as needed for allergies.   clopidogrel (PLAVIX) 75 MG tablet TAKE ONE TABLET BY MOUTH EVERY MORNING   diphenoxylate-atropine (LOMOTIL) 2.5-0.025 MG tablet Take 1 tablet by mouth 4 (four) times daily as needed for diarrhea or loose stools.   empagliflozin (JARDIANCE) 10 MG TABS tablet Take 1 tablet (10 mg total) by mouth daily before breakfast. Please start 7 days after stopping study drug Avoid dehydration.   ENTRESTO 49-51 MG TAKE ONE TABLET BY MOUTH every morning & TAKE ONE TABLET BY MOUTH AT bedtime   fluticasone (FLONASE) 50 MCG/ACT nasal spray Place 2 sprays into both nostrils daily as needed for allergies or rhinitis.     furosemide (LASIX) 40 MG tablet Take 1 tablet (40 mg total) by mouth as needed. Take only if you have more than usual leg swelling   guaiFENesin (MUCINEX) 600 MG 12 hr tablet Take 600 mg by mouth 2 (two) times daily as needed (congestion/cough).   Investigational - Study Medication Study name: EMPACT MI Additional study details: EMPACT-MI: A Streamlined, Multicentre, Randomised, Parallel Group, Double-blind Placebo-controlled Superiority Trial to Evaluate the Effect of EMPAgliflozin on Hospitalisation for Heart Failure and Mortality in Patients With aCuTe Myocardial Infarction   isosorbide mononitrate (IMDUR) 30 MG 24 hr tablet Take 0.5 tablets (15 mg total) by mouth daily.   metoprolol succinate (TOPROL-XL) 25 MG 24 hr tablet Take 0.5 tablets (12.5 mg total) by mouth every other day.   spironolactone (ALDACTONE) 25 MG tablet TAKE ONE TABLET BY MOUTH EVERY MORNING   tamsulosin (FLOMAX) 0.4 MG CAPS capsule Take 1 capsule (0.4 mg total) by mouth daily as needed (urine flow).   XARELTO 2.5 MG TABS tablet TAKE ONE TABLET BY MOUTH EVERY MORNING and TAKE ONE TABLET BY MOUTH EVERY EVENING   No facility-administered encounter medications on file as of 10/23/2021.   Current antihypertensive regimen:  Entresto 49/51 1 tablet daily Metoprolol ER 25 mg  1/2 tablet every other day Furosemide 40 mg 1 tablet daily as need Spironolactone 25 mg once daily  How often are you checking your Blood Pressure?  Patient reported checking blood pressures  Current home BP readings: 120/70 range    What recent interventions/DTPs have been made by any provider to improve Blood Pressure control since last CPP Visit:  Patient  denied and recent changes in his current medication regimen.    Any recent hospitalizations or ED visits since last visit with CPP? Patient had a cardiac cath on 09/16/21   What diet changes have been made to improve Blood Pressure Control?  Patient tries to limit his salt intake in his diet    What exercise is being done to improve your Blood Pressure Control?   Patient remains as active as possible.    Adherence Review: Is the patient currently on ACE/ARB medication? No Does the patient have >5 day gap between last estimated fill dates? No   Care Gaps   AWV: done 02/20/21 Colonoscopy: done 06/28/28 DM Eye Exam: N/A DM Foot Exam: N/A Microalbumin: unknown HbgAIC: done 07/26/19 (5.5) DEXA: N/A Mammogram: N/A     Star Rating Drugs: Atorvastatin (LIPITOR) 80 MG tablet - last filled 09/02/21 90 days    Future Appointments  Date Time Provider [place]  12/03/2021 11:45 AM Patwardhan, Reynold Bowen, MD [facility] None  12/31/2021  3:00 PM [facility] CCM PHARMACIST [facility] P[facility]  01/02/2022  3:00 PM [facility] 1 [facility]  01/19/2022  2:30 PM Lavonna Monarch, MD [facility] [place], [facility] Clinical Pharmacist Assistant  [phone]

## 2021-11-13 DIAGNOSIS — H40053 Ocular hypertension, bilateral: Secondary | ICD-10-CM | POA: Diagnosis not present

## 2021-11-13 DIAGNOSIS — H3500 Unspecified background retinopathy: Secondary | ICD-10-CM | POA: Diagnosis not present

## 2021-11-13 DIAGNOSIS — H3552 Pigmentary retinal dystrophy: Secondary | ICD-10-CM | POA: Diagnosis not present

## 2021-11-13 DIAGNOSIS — R6889 Other general symptoms and signs: Secondary | ICD-10-CM | POA: Diagnosis not present

## 2021-11-13 DIAGNOSIS — D8989 Other specified disorders involving the immune mechanism, not elsewhere classified: Secondary | ICD-10-CM | POA: Diagnosis not present

## 2021-11-13 DIAGNOSIS — Z961 Presence of intraocular lens: Secondary | ICD-10-CM | POA: Diagnosis not present

## 2021-11-13 DIAGNOSIS — Z1589 Genetic susceptibility to other disease: Secondary | ICD-10-CM | POA: Diagnosis not present

## 2021-11-13 DIAGNOSIS — H35373 Puckering of macula, bilateral: Secondary | ICD-10-CM | POA: Diagnosis not present

## 2021-11-13 DIAGNOSIS — H43813 Vitreous degeneration, bilateral: Secondary | ICD-10-CM | POA: Diagnosis not present

## 2021-11-24 ENCOUNTER — Other Ambulatory Visit: Payer: Self-pay | Admitting: Cardiology

## 2021-11-24 DIAGNOSIS — I739 Peripheral vascular disease, unspecified: Secondary | ICD-10-CM

## 2021-11-24 DIAGNOSIS — I25118 Atherosclerotic heart disease of native coronary artery with other forms of angina pectoris: Secondary | ICD-10-CM

## 2021-11-24 DIAGNOSIS — I5020 Unspecified systolic (congestive) heart failure: Secondary | ICD-10-CM

## 2021-11-25 ENCOUNTER — Telehealth: Payer: Self-pay | Admitting: Pharmacist

## 2021-11-25 ENCOUNTER — Other Ambulatory Visit: Payer: Self-pay | Admitting: *Deleted

## 2021-11-25 DIAGNOSIS — Z006 Encounter for examination for normal comparison and control in clinical research program: Secondary | ICD-10-CM

## 2021-11-25 NOTE — Progress Notes (Signed)
Chronic Care Management Pharmacy Assistant   Name: Jonathon Cain  MRN: [id number] DOB: 1960-10-25   Reason for Encounter: Monthly Medication Coordination Call     Medications: Outpatient Encounter Medications as of 11/25/2021  Medication Sig   atorvastatin (LIPITOR) 80 MG tablet TAKE ONE TABLET BY MOUTH EVERYDAY AT BEDTIME   cetirizine (ZYRTEC) 10 MG tablet Take 10 mg by mouth daily as needed for allergies.   clopidogrel (PLAVIX) 75 MG tablet TAKE ONE TABLET BY MOUTH EVERY MORNING   diphenoxylate-atropine (LOMOTIL) 2.5-0.025 MG tablet Take 1 tablet by mouth 4 (four) times daily as needed for diarrhea or loose stools.   empagliflozin (JARDIANCE) 10 MG TABS tablet Take 1 tablet (10 mg total) by mouth daily before breakfast. Please start 7 days after stopping study drug Avoid dehydration.   ENTRESTO 49-51 MG TAKE ONE TABLET BY MOUTH every morning & TAKE ONE TABLET BY MOUTH AT bedtime   fluticasone (FLONASE) 50 MCG/ACT nasal spray Place 2 sprays into both nostrils daily as needed for allergies or rhinitis.    furosemide (LASIX) 40 MG tablet Take 1 tablet (40 mg total) by mouth as needed. Take only if you have more than usual leg swelling   guaiFENesin (MUCINEX) 600 MG 12 hr tablet Take 600 mg by mouth 2 (two) times daily as needed (congestion/cough).   Investigational - Study Medication Study name: EMPACT MI Additional study details: EMPACT-MI: A Streamlined, Multicentre, Randomised, Parallel Group, Double-blind Placebo-controlled Superiority Trial to Evaluate the Effect of EMPAgliflozin on Hospitalisation for Heart Failure and Mortality in Patients With aCuTe Myocardial Infarction   isosorbide mononitrate (IMDUR) 30 MG 24 hr tablet Take 0.5 tablets (15 mg total) by mouth daily.   metoprolol succinate (TOPROL-XL) 25 MG 24 hr tablet Take 0.5 tablets (12.5 mg total) by mouth every other day.   spironolactone (ALDACTONE) 25 MG tablet TAKE ONE TABLET BY MOUTH EVERY MORNING   tamsulosin  (FLOMAX) 0.4 MG CAPS capsule Take 1 capsule (0.4 mg total) by mouth daily as needed (urine flow).   XARELTO 2.5 MG TABS tablet TAKE ONE TABLET BY MOUTH EVERY MORNING and TAKE ONE TABLET BY MOUTH EVERY EVENING   No facility-administered encounter medications on file as of 11/25/2021.    Reviewed chart for medication changes ahead of medication coordination call.  No OVs, Consults, or hospital visits since last care coordination call/Pharmacist visit. (If appropriate, list visit date, provider name)  No medication changes indicated OR if recent visit, treatment plan here.  BP Readings from Last 3 Encounters:  10/02/21 (!) 104/58  10/02/21 (!) 110/59  09/16/21 107/60    Lab Results  Component Value Date   HGBA1C 5.5 07/26/2019     Patient obtains medications through Adherence Packaging  90 Days   Last adherence delivery included: (medication name and frequency)  Spirolactone 25 mg 1 tablet daily Entresto 49/51 1 tablet daily Metoprolol ER 25 mg  1/2 tablet every other day Atorvastatin 80 mg 1 tablet daily Clopidogrel 75 mg 1 tablet daily   Xarelto 2.'5mg'$  1 tablet BID Cetrizine 10 mg 1 tablet daily Furosemide 40 mg 1 tablet daily as need (in vial)    Patient is due for next adherence delivery on: 12/05/21. Called patient and reviewed medications and coordinated delivery.  This delivery to include:  Spirolactone 25 mg 1 tablet daily Entresto 49/51 1 tablet daily Metoprolol ER 25 mg  1/2 tablet every other day Atorvastatin 80 mg 1 tablet daily Clopidogrel 75 mg 1 tablet daily   Cetrizine  10 mg 1 tablet daily Furosemide 40 mg 1 tablet daily as need (in vial) Isosorbide Mono ER 30 mg 1/2 tablet breakfast (new) Jardiance 10 mg 1 tablet daily before breakfast (new) Lomotil as needed  4 times a day  Discontinued Xarelto 2.'5mg'$  1 tablet BID   Patients refills have been requested from Specialist by pharmacy staff.   Confirmed delivery date of 12/05/21, advised patient that  pharmacy will contact them the morning of delivery.   Care Gaps   AWV: done 02/20/21 Colonoscopy: done 06/28/28 DM Eye Exam: N/A DM Foot Exam: N/A Microalbumin: unknown HbgAIC: done 07/26/19 (5.5) DEXA: N/A Mammogram: N/A     Star Rating Drugs: Atorvastatin (LIPITOR) 80 MG tablet - last filled 09/02/21 90 days    Future Appointments  Date Time Provider [place]  12/02/2021 11:00 AM [place] 1 [facility] None  12/03/2021 11:45 AM Patwardhan, Reynold Bowen, MD [facility] None  12/31/2021  3:00 PM [facility] CCM PHARMACIST [facility]  01/02/2022  3:00 PM [facility] 1 [facility]    [name], [place] Clinical Pharmacist Assistant  [phone]

## 2021-11-25 NOTE — Progress Notes (Signed)
Orders entered for batwire screening

## 2021-12-01 ENCOUNTER — Encounter (INDEPENDENT_AMBULATORY_CARE_PROVIDER_SITE_OTHER): Payer: Self-pay

## 2021-12-02 ENCOUNTER — Ambulatory Visit (HOSPITAL_COMMUNITY)
Admission: RE | Admit: 2021-12-02 | Discharge: 2021-12-02 | Disposition: A | Discharge status: Home / Self Care | Payer: Medicare HMO | Source: Ambulatory Visit | Attending: Cardiology | Admitting: Cardiology

## 2021-12-02 ENCOUNTER — Encounter: Payer: Medicare HMO | Admitting: *Deleted

## 2021-12-02 ENCOUNTER — Ambulatory Visit (HOSPITAL_BASED_OUTPATIENT_CLINIC_OR_DEPARTMENT_OTHER)
Admission: RE | Admit: 2021-12-02 | Discharge: 2021-12-02 | Disposition: A | Discharge status: Home / Self Care | Payer: Medicare HMO | Source: Ambulatory Visit | Attending: Cardiology | Admitting: Cardiology

## 2021-12-02 VITALS — BP 112/62 | HR 60 | Ht 71.0 in | Wt 289.0 lb

## 2021-12-02 DIAGNOSIS — E785 Hyperlipidemia, unspecified: Secondary | ICD-10-CM | POA: Insufficient documentation

## 2021-12-02 DIAGNOSIS — Z006 Encounter for examination for normal comparison and control in clinical research program: Secondary | ICD-10-CM

## 2021-12-02 DIAGNOSIS — I110 Hypertensive heart disease with heart failure: Secondary | ICD-10-CM | POA: Insufficient documentation

## 2021-12-02 DIAGNOSIS — I350 Nonrheumatic aortic (valve) stenosis: Secondary | ICD-10-CM | POA: Insufficient documentation

## 2021-12-02 DIAGNOSIS — I509 Heart failure, unspecified: Secondary | ICD-10-CM | POA: Insufficient documentation

## 2021-12-02 DIAGNOSIS — I2510 Atherosclerotic heart disease of native coronary artery without angina pectoris: Secondary | ICD-10-CM | POA: Insufficient documentation

## 2021-12-02 LAB — ECHOCARDIOGRAM COMPLETE
AR max vel: 2.18 cm2
AV Peak grad: 9.5 mmHg
Ao pk vel: 1.55 m/s
Area-P 1/2: 4.33 cm2
Calc EF: 14.6 %
S' Lateral: 5.9 cm
Single Plane A2C EF: -6.4 %
Single Plane A4C EF: 30.5 %

## 2021-12-02 MED ORDER — PERFLUTREN LIPID MICROSPHERE
1.0000 mL | INTRAVENOUS | Status: DC | PRN
  Administered 2021-12-02: 4 mL via INTRAVENOUS
  Filled 2021-12-02: qty 10

## 2021-12-02 NOTE — Research (Signed)
bmpSection A:  Administrative Section  Subject ID: _[id number]_ - _033_ - 015 Subject Initials: _G_ _W_ _E_  Date subject signed informed consent  _12_/_SEP_/_2023___      (DD /  MMM  / YYYY)  Has the subject been previously screened?    _0   No     _1    Yes                                                                                  Previous Subject ID   __ __ __ __ - __ __ __ - 015       Has the subject been randomized in the BeAT-HF Trial?    _2   No     _3    Yes  Previous Subject ID   __ __ __ __ - __ __ __ - 013  Section B:  Enrollment Criteria  In the investigator's opinion does the subject meet the FDA Indication for Use:  _4  Yes    _5  No (STOP - subject does not qualify for the study)  Has the subject received cardiac resynchronization therapy (CRT) within six months of enrollment, or is actively receiving CRT? _6  Yes (STOP - subject does not qualify for the study)   _7  No  Section C:  Serum Biomarkers   NT-proBNP: _709_ Date:    _12_/_SEP_/_2023_                  (DD / MMM / YYYY)  eGFR:  _100_ mL/min/1.43m Date:    _12_/_SEP_/_2023_                  (DD / MMM / [place]  Negative Pregnancy Test? _8  N/A Date:    _____/_______/_______                  (DD / MMM / YYYY)   _9  Yes     _10  No   Section D:  Appropriate Surgical/Study Candidate:   Is the subject able to discontinue the use of antiplatelet drugs (e.g. aspirin) in advance of the procedure, if required? _11  Yes   _12  No  Assessed by:  __Well BTrula Slade MD_ Implanting Physician _13  Yes Date:    _12_/_SEP_/_2023_                 (DD / MMM / [place] Breeding   _14  No   Assessed by:   ________________________ Vascular / Other Surgeon _15  Yes Date:    _____/_______/_______                 (DD / MMM / YYYY)     _16  No   Section E:  Inclusion/Exclusion Criteria  Does the subject meet all Inclusion Criteria? _17  Yes   _18  No (STOP - subject does not qualify for the study)   Check all that apply   _19  Age at least 21 years and no  more than 80 years at the time of enrollment.   _20  Appropriate candidate for the surgery as determined by an evaluation from the implanting physician using a carotid duplex ultrasound (CDU) [or Computed Tomography Angiography (CTA) if CDU inconclusive or incomplete] and a review of medical history (  including existence of infections that may increase implant risk).  Evaluation must confirm the following within 45 days of the BAROSTIM NEO implant: Appropriate medical condition and medical history for implantation of the BAROSTIM NEO System AND Anatomy that enables this implant procedure, with no vascular structures or orientations or neck anomalies that would be obstructive to the implantation path AND The artery planned for the BAROSTIM NEO implant must have: A carotid bifurcation below the level of the mandible AND No ulcerative carotid arterial plaques AND No carotid atherosclerosis producing a 30% or greater stenosis in linear diameter in the internal carotid AND No carotid atherosclerosis producing a 30% or greater stenosis in linear diameter in the distal common carotid AND Have had no prior surgery, radiation, or endovascular stent placement in the carotid artery or the carotid sinus region AND Able to discontinue the use of antiplatelet drugs (e.g. aspirin) in advance of the procedure, if required   _0  Six-minute hall walk (6MHW) ? 150 m AND ? 400 m within 45 days prior to implant.   _1  Serum estimated glomerular filtration rate (eGFR) ? 25 mL/min/1.73 m2 using the CKD-EPI method within 45 days prior to the [facility] NEO implant.   _2  Body mass index ? 40 kg/m2 within 45 days prior to the [facility] NEO implant.   _3  If male and of childbearing potential, must use a medically accepted method of birth control (e.g., barrier method with spermicide, oral contraceptive, or abstinence) and agree to continue use of this method for the duration of the study. Women of childbearing potential must have a  negative pregnancy test within 14 days prior to the [facility] NEO implant.   _4  Subjects implanted with a cardiac rhythm management device that does not utilize an intracardiac lead, or implanted with a neurostimulation device, must be approved by the [place].   _5  At the end of screening/baseline, subject still meets the Barostim Neo Indication for Use   _6  Signed a [facility]-approved informed consent form for participation in this study.      Does the subject meet any Exclusion Criteria? _7  Yes (STOP - subject does not qualify for the study)   _8  No     List criteria # met: __________________________   _9  Received cardiac resynchronization therapy (CRT) within six months of enrollment or is actively receiving CRT.   _10  Any of the following contraindications: Baroreflex failure or autonomic neuropathy  Uncontrolled, symptomatic cardiac bradyarrhythmias Known allergy to silicone or titanium   _11  Unstable ventricular arrhythmias.   _12  Presence of baseline cranial nerve dysfunction at risk from cervical interventions on the carotid bifurcation determined by the Ear, Nose and Throat (ENT) examination.   _13  Subjects with any surgery that has occurred, or is planned to occur, within 45 days of the [facility] NEO implant.   _14  Recent history (within 6 months of implant) of significant and uncontrolled bleeding.   _15  Known and untreated hypercoagulability state.   _16  An inappropriate study candidate as evidenced by: Solid organ or hematologic transplant, or currently being evaluated for an organ transplant. Has received or is receiving LVAD therapy or chronic dialysis. Current or planned treatment with intravenous positive inotrope therapy. Primary pulmonary hypertension. Severe COPD or severe restrictive lung disease (e.g. requires chronic oral steroid use or home oxygen use).  Heart failure secondary to a reversible cause, such as cardiac structural valvular disease, acute myocarditis  and pericardial constriction. Clinically significant cardiac structural valvular disease.  Unable or unwilling to fulfill the protocol  medication compliance and follow-up requirements, for reasons including but not limited to an unresolved history of alcohol or substance abuse or psychiatric disorder. Active malignancy.  Any other serious medical condition that may adversely affect the safety of the participant or validity of the study, in the opinion of the investigator. Life expectancy less than one year.   _0  Any of the following within 3 months prior to the [facility] NEO implant. Myocardial infarction Unstable angina Coronary intervention (e.g. CABG or PTCA)  Cerebral vascular accident or transient ischemic attack Sudden cardiac death  Surgical cardiac intervention (e.g. cardiac ablation, valve replacement)   _1  Enrolled and active in another (e.g. device, pharmaceutical, or biological) clinical study unless approved by the [facility] Clinical department.  Section F:  Adverse Events  Were there any Adverse Events that occurred since consent? _2  Yes (complete AE form)   _3  No  Section H:  Medication Changes   Have there been any changes to the subject's home use medications for Arrhythmia, Antiplatelet/Anticoagulation and Heart failure medications during screening and baseline? _4  Yes (update Med form)   _5  No  Section I:  Signature   Person completing form (Print Name): __Kimberly [place] :) __________________  Signature: Philemon Kingdom :) __________ Date: __09/12/2023________________________     Section A:  Administrative Section  Subject ID: _[id number]_ - _033_ - 015 Subject Initials: _G_ _W_ _E_  Visit Interval:    _6   Screening/Baseline    _7  Activation   _8  0.5 Month       _9  1 Month     _10  2 Month     _11  3 Month       _12  6 Month     _13  12 Month         _14  Unscheduled, reason for visit: _________________________________________  Section B:  Physical Assessment   Date:     _12_/_SEP_/_2023__   (DD / MMM / YYYY)  Weight: _289__  _15  kg  _16  pounds Height (Screening Visit Only): _180__  _17  cm _18  inches  Blood Pressure: _112/62_mmHg Heart Rate: _60_ bpm  Section E:  Signature    Person completing form (Print Name): Philemon Kingdom :) _______________________  Signature: Philemon Kingdom :) _______ Date: _09/12/2023________________     SECTION A:  Administrative Section  Patient ID: _[id number]_ - _033_ - 015 Patient Initials: _G_ _W_ _E_  Visit Interval:    _19   Screening/Baseline*    _20  Activation   _21  0.5 Month       _22  1 Month     _23  2 Month     _24  3 Month       _25  6 Month     _26  12 Month         _27  Unscheduled, reason for visit: _________________________________________  * For Screening / Baseline only the questions are based on 30 days prior to consent.  SECTION B:  COVID-19 Like Illness Symptoms   Has the subject experienced any cold, flu or COVID-19 symptoms since the last study visit? _28   No  (skip to section C)     _29  Yes, date of onset:  _____/_____ MMM/YYYY    If yes, check all symptoms that apply:   _30  Fevers or chills   _31   New or Worsening Cough  _32  Productive  _33  Dry     If yes to cough, indicate severity  _34  Constant  _35  Occasional, several per hour   _36  New or worsened shortness of breath   _37  Diarrhea   _38   Altered or reduced sense of smell or taste   _0  Muscle aches/Severe Fatigue   _1  Chest pain or tightness   _2  Sore throat   _3  Nausea or vomiting  SECTION C:  COVID-19 Like Illness Testing   Has the patient been tested for COVID-19 since the last study visit? _4  No     _5  Yes, date of test:  _____/_____ MMM/YYYY    If yes, test results:   _6  Positive _7  Negative _8  Unknown  Has the patient been tested for COVID-19 Antibodies since the last study visit? _9  No     _10  Yes, date of test:  _____/_____ MMM/YYYY    If yes, test results:   _11  Positive _12  Negative _13  Unknown  Has the patient been vaccinated for COVID-19 since  the last study visit? _14  No     _15  Yes, date of test:  _____/_____ MMM/YYYY  Has the patient been tested for Influenza ("flu") since the last study visit? _16  No     _17  Yes, date of test:  _____/_____ MMM/YYYY    If yes, test results:   _18  Positive _19  Negative _20  Unknown  Has the patient been vaccinated for Influenza ("flu") since the last study visit? _21  No     _22  Yes, date of test:  _____/_____ MMM/YYYY  SECTION D:  COVID-19 Like Illness Exposure  Has the subject been told that they might have had COVID-19/have symptoms suggestive of COVID-19 since the last study visit? _23    No   _24  Yes   _25   Unknown  Has the subject been exposed to anyone with known or suspected COVID-19 since the last study visit? _26    No   _27  Yes   _28   Unknown  Has the subject been told that they might have had the "flu" or influenza since the last study visit? _29    No   _30  Yes   _31   Unknown  SECTION E:  Effects of COVID Pandemic on Subject's Healthcare Interactions  Since the last study visit, did the subject feel the need to go to an emergency department or hospital for their heart failure but decided not to because of concerns about COVID-19? _32   No   _33  Yes   _34  Unknown    If yes, how did they seek care (check all that apply):   _35  Telemedicine visit _36  In-person _37  Clinic _38  Urgent Care   _39  Subject did not change hospital/ER use due to COVID-19 _40  Other, specify: ________________________  Since the last study visit, did the subject have a cardiology/HF related appointment cancelled/rescheduled due to COVID-19 pandemic? _41   No   _42  Yes, how many? ___________   _43  Unknown  Since the last study visit, did the subject have any cardiology/HF related telemedicine visit due to COVID-19 pandemic? _44   No   _45  Yes, how many? ___________   _46  Unknown  Since the last study visit, did the subject have a cardiology/HF procedure cancelled/rescheduled due to COVID-19 pandemic? _47   No   _48  Yes, how many?  ___________   _49  Unknown  SECTION F:  Effects of COVID Pandemic on Subject's Medications  Since the last visit, did any of their heart failure medications change or stop, even for a short time?   If yes, enter change into medication eCRF _50   No   _51  Yes, how many? ___________   _52  Unknown    If yes, why:   _53  Instructed by Doctor _54  Self-discontinued _55  Unknown _56  Other:  ________________  Since the last visit, was the subject prescribed any medications for COVID-19? _0   No   _1  Yes   _2  Unknown    If yes, what medications (generic name):  SECTION G:  Effects of COVID Pandemic on Subject's Lifestyle  How has the subject's activity/exercise level changed due to COVID-19 pandemic? _3   No change   _4  More activity/exercise   _5  Less activity/exercise  How has the subject's smoking habits changed due to COVID-19? _6   Does not smoke   _7  No change   _8  Smoke more   _9  Smoke less  How has the subject's alcohol drinking habits changed due to COVID-19? _10   Does not drink   _11  No change   _12  Drink more   _13  Drink less  Section H:  Signature  Person completing form (Print Name): Philemon Kingdom :) __________________  Signature: Philemon Kingdom :) _____ Date: _09/12/2023__________________    Section A:  Administrative Section  Subject ID: _[id number]_ - _033_ - 015 Subject Initials: _G_ _W_ _E_ Visit Interval:  _14   Baseline     _15  6 Month  Section B:  NYHA   NYHA Classification Date:    _12_/_Sep_/_2023__  (DD / MMM / YYYY)  Select One Class Subject Symptoms  _16  I No limitations of physical activity, No undue fatigue, palpitation or dyspnea  _17  II Slight limitation of physical activity, Comfortable at rest, Less than ordinary activity results in fatigue, Palpitation, or dyspnea  _18  III Marked limitation of physical activity, Comfortable at rest, Less than ordinary activity results in fatigue, palpitation, or dyspnea  _19  IV Unable to carry out any physical activity without  discomfort, Symptoms of cardiac insufficiency at rest, Physical activity causes increased discomfort  Name of person conducting NYHA: Philemon Kingdom :) _____________________  Section C:  6MHW   Six Minute Hall Walk Test Date:    _12_/_SEP_/_2023__  (DD / MMM / [name])  Distance Walked __283___ meters  Were there any devices used to assist the subject in walking (e.g. cane, walker, etc.)? _20  No   _21  Yes specify:____cane___________________  Was the walk terminated before 6 minutes? _22  No   _23  Yes specify reason: (select all that apply)    _24  Angina    _25   Dyspnea    _26   Fatigue    _27   Dizziness    _28   Syncope    _29   Other, specify:  Name of person conducting 6-Minute Hall Walk: __Kimberly Alba Destine :) ________  Section D:  Signature   Person completing form (Print Name): Philemon Kingdom :) ____________________  Signature: Philemon Kingdom :) ________ Date: __09/12/2023_______________     Section A:  Administrative Section   Subject ID: _[id number]_ - _033_ - 015  Subject Initials: _G_ _W_ _E_  Section B:  Carotid Duplex Ultrasound (CDU) _30  Done _31  Not Done  Date: _12_/_SEP_/_2023_ (DD / MMM / [name])  _32  Images sent to [facility]  % atherosclerosis in the internal and distal common carotid and duplex measurements:  Right Side Measurements    Both internal and distal common carotids are less than 30% _33  Yes  _34  No (cannot be used for implant)   Comments on determination of <30% stenosis:  Internal Carotid (ICA) _35   Normal (no plaque) PSV (peak systolic velocity) __34___ cm/sec   _36   ?15% (*see CTA) EDV (end diastolic velocity) __19___ cm/sec   _37   16-49% (*see CTA) Linear diameter __28___ mm   _38   50-69% Normal spectral waveform  _39  Yes   _40   ?  70%  _0  No, specify pattern: __________________  Distal Common Carotid [facility]) _1   Normal (no plaque) PSV (peak systolic velocity) __28___ cm/sec   _2   ?15% (*see CTA) EDV (end diastolic velocity) __31___ cm/sec   _3   16-49% (*see  CTA) Linear diameter __63___ mm   _4   50-69% Normal spectral waveform  _5  Yes   _6   ?70%  _7  No, specify pattern: __________________  Left Side Measurements   Both internal and distal common carotids are less than 30% _8  Yes  _9  No (cannot be used for implant)   Comments on determination of <30% stenosis:  Internal Carotid (ICA)  _10   Stenosis Normal (no plaque) PSV (peak systolic velocity) __51___ cm/sec   _11   ?15% (*see CTA) EDV (end diastolic velocity) __76___ cm/sec   _12   16-49% (*see CTA) Linear diameter __53___ mm   _13   50-69%  Normal spectral waveform  _14  Yes   _15   ?70%   _16  No, specify pattern: __________________  Distal Common Carotid [facility])  _17   Stenosis Normal (no plaque) PSV (peak systolic velocity) __16___ cm/sec   _18   ?15% (*see CTA) EDV (end diastolic velocity) __07___ cm/sec   _19   16-49% (*see CTA) Linear diameter __72___ mm   _20   50-69%  Normal spectral waveform  _21  Yes   _22   ?70%   _23  No, specify pattern: __________________  Name of person reading CDU: Servando Snare, MD  Section C:  Computed Tomography Angiogram (CTA) (if required) _24  Done _25  Not Done  _26  CDU was inconclusive (e.g. *stenosis greater than 0%) Date: _____/_______/_______  (DD / MMM / [name])  _27  Images sent to [facility]  _28  CDU was incomplete/not done    _29  CTA requested by [facility]    % atherosclerosis Internal Carotid (ICA) Distal Common Carotid [place])  Right Side _______% _______%  Left Side _______% _______%  Name of person reading CTA:   Section D:  Physician Assessment  Are there any vascular structures or orientations or neck anomalies that would be obstructive to the implantation path? _30  No _31  Yes (Specify side):     _32  Left  _33  Right _34  Both  Has the subject had prior surgery, radiation, or endovascular stent placement in the carotid artery or the carotid sinus region? _35  No _36  Yes (Specify side):     _37  Left  _38  Right _39  Both  Is at least one carotid bifurcation below the level of the  mandible? _40  No  _41  Yes (Specify side):     _42  Left  _43  Right _44  Both  Are there any ulcerative carotid arterial plaques? _45  No  _46  Yes (Specify side):     _47  Left  _48  Right _49  Both  Is this subject a candidate for the BATwire procedure? _50  No  _51  Yes (Specify side):     _52  Left  _53  Right _54  Both  Section E:  Comments  Screen failed due to bilateral bifurcations to high         Section F:  Signature   Person completing form (Print Name): Philemon Kingdom :) ______  Signature: Philemon Kingdom :) __________ Date: __09/12/2023________   Section A:  Administrative Section  Subject ID: _[id number] - _033_ - 015 Subject Initials: _G_ _W_ _E_ Visit Interval:  _55   Baseline     _56  6 Month     _57  12 Month  Section B:  Cardiac Echo   Cardiac Echo Date:  _12_/_SEP_/_2023_ (DD / MMM / [name])  _58  Images sent to  [facility]  Heart rate ___55__ bpm Echo type: _0  2D Images Captured: (4-Chamber and Biplane required) _1  PS-LAX  Blood Pressure _105_/_69_ mmHg  _2  3D  _3  Biplane        _4  Apical 4-Chamber        _5  Other, specify:_______   LV End Diastolic Dimension __15___ mm   LV End Systolic Dimension __86___ mm   LA Volume __23___ mL/m2   LV End-Diastolic Volume __825__RK   LV End-Systolic Volume __935__ mL   LV Ejection Fraction __25___ %  Section C:  Comments  N/a    Section C:  Signature   Person completing form (Print Name): __Kimberly Alba Destine :) _________________  Signature: Philemon Kingdom :) ______ Date: ___09/12/2023_______________________

## 2021-12-02 NOTE — Research (Signed)
Batwire Informed Consent   Subject Name: Jonathon Cain  Subject met inclusion and exclusion criteria.  The informed consent form, study requirements and expectations were reviewed with the subject and questions and concerns were addressed prior to the signing of the consent form.  The subject verbalized understanding of the trial requirements.  The subject agreed to participate in the [facility] trial and signed the informed consent at 1108 on 12/02/2021.  The informed consent was obtained prior to performance of any protocol-specific procedures for the subject.  A copy of the signed informed consent was given to the subject and a copy was placed in the subject's medical record.   [name]

## 2021-12-03 ENCOUNTER — Ambulatory Visit: Payer: Medicare HMO | Admitting: Cardiology

## 2021-12-03 ENCOUNTER — Encounter: Payer: Self-pay | Admitting: Cardiology

## 2021-12-03 VITALS — BP 91/53 | HR 51 | Temp 98.0°F | Resp 16 | Ht 71.0 in | Wt 295.0 lb

## 2021-12-03 DIAGNOSIS — I5020 Unspecified systolic (congestive) heart failure: Secondary | ICD-10-CM | POA: Diagnosis not present

## 2021-12-03 DIAGNOSIS — I25118 Atherosclerotic heart disease of native coronary artery with other forms of angina pectoris: Secondary | ICD-10-CM | POA: Diagnosis not present

## 2021-12-03 DIAGNOSIS — I739 Peripheral vascular disease, unspecified: Secondary | ICD-10-CM

## 2021-12-03 LAB — BASIC METABOLIC PANEL
BUN/Creatinine Ratio: 18 (ref 10–24)
BUN: 14 mg/dL (ref 8–27)
CO2: 21 mmol/L (ref 20–29)
Calcium: 8.6 mg/dL (ref 8.6–10.2)
Chloride: 100 mmol/L (ref 96–106)
Creatinine, Ser: 0.79 mg/dL (ref 0.76–1.27)
Glucose: 89 mg/dL (ref 70–99)
Potassium: 4.2 mmol/L (ref 3.5–5.2)
Sodium: 134 mmol/L (ref 134–144)
eGFR: 100 mL/min/{1.73_m2} (ref >59)

## 2021-12-03 LAB — PRO B NATRIURETIC PEPTIDE: NT-Pro BNP: 709 pg/mL — ABNORMAL HIGH (ref 0–210)

## 2021-12-03 MED ORDER — ASPIRIN 81 MG PO TBEC: 81.0000 mg | DELAYED_RELEASE_TABLET | Freq: Every day | ORAL | 3 refills | Status: DC

## 2021-12-03 MED ORDER — EMPAGLIFLOZIN 10 MG PO TABS: 10.0000 mg | ORAL_TABLET | ORAL | 3 refills | Status: DC

## 2021-12-03 NOTE — Progress Notes (Signed)
Patient referred by Janeece Agee, NP for CAD  Subjective:   Jonathon Cain, male    DOB: March 10, 1961, 61 y.o.   MRN: [id number]   Chief Complaint  Patient presents with   Coronary Artery Disease   Follow-up    2 month     HPI  61 y.o. 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 Angina and dyspnea symptoms have significantly improved after starting Imdur 15 mg daily.  He is not considered candidate for Barostim clinical trial due to anatomical reasons.    Current Outpatient Medications:    atorvastatin (LIPITOR) 80 MG tablet, TAKE ONE TABLET BY MOUTH EVERYDAY AT BEDTIME, Disp: 103 tablet, Rfl: 0   cetirizine (ZYRTEC) 10 MG tablet, Take 10 mg by mouth daily as needed for allergies., Disp: , Rfl:    clopidogrel (PLAVIX) 75 MG tablet, TAKE ONE TABLET BY MOUTH EVERY MORNING, Disp: 103 tablet, Rfl: 0   diphenoxylate-atropine (LOMOTIL) 2.5-0.025 MG tablet, Take 1 tablet by mouth 4 (four) times daily as needed for diarrhea or loose stools., Disp: 30 tablet, Rfl: 0   empagliflozin (JARDIANCE) 10 MG TABS tablet, Take 1 tablet (10 mg total) by mouth daily before breakfast. Please start 7 days after stopping study drug Avoid dehydration., Disp: 90 tablet, Rfl: 3   ENTRESTO 49-51 MG, TAKE ONE TABLET BY MOUTH TWICE DAILY, Disp: 180 tablet, Rfl: 3   fluticasone (FLONASE) 50 MCG/ACT nasal spray, Place 2 sprays into both nostrils daily as needed for allergies or rhinitis. , Disp: , Rfl:    furosemide (LASIX) 40 MG tablet, TAKE ONE TABLET BY MOUTH AS NEEDED. take ONLY IF you have more THAN usual LEG swelling, Disp: 30 tablet, Rfl: 2   guaiFENesin  (MUCINEX) 600 MG 12 hr tablet, Take 600 mg by mouth 2 (two) times daily as needed (congestion/cough)., Disp: , Rfl:    Investigational - Study Medication, Study name: EMPACT MI Additional study details: EMPACT-MI: A Streamlined, Multicentre, Randomised, Parallel Group, Double-blind Placebo-controlled Superiority Trial to Evaluate the Effect of EMPAgliflozin on Hospitalisation for Heart Failure and Mortality in Patients With aCuTe Myocardial Infarction, Disp: , Rfl:    isosorbide mononitrate (IMDUR) 30 MG 24 hr tablet, Take 0.5 tablets (15 mg total) by mouth daily., Disp: 30 tablet, Rfl: 3   metoprolol succinate (TOPROL-XL) 25 MG 24 hr tablet, TAKE 1/2 TABLET BY MOUTH Every Other Day at Bedtime, Disp: 45 tablet, Rfl: 2   spironolactone (ALDACTONE) 25 MG tablet, TAKE ONE TABLET BY MOUTH EVERY MORNING, Disp: 90 tablet, Rfl: 2   tamsulosin (FLOMAX) 0.4 MG CAPS capsule, Take 1 capsule (0.4 mg total) by mouth daily as needed (urine flow)., Disp: 90 capsule, Rfl: 1   XARELTO 2.5 MG TABS tablet, TAKE ONE TABLET BY MOUTH EVERY MORNING and TAKE ONE TABLET BY MOUTH EVERY EVENING, Disp: 180 tablet, Rfl: 1    Cardiovascular and other pertinent studies:  RHC/LHC 09/16/2021: LM: Normal LAD: Patent prox LAD stent with 40% late lumen loss         RFR 0.94 (physiologically non-significant) Lcx: OM1 ostial 50% stenosis (Improved from previous cath in 2021-80% with negative RFR then) RCA: Distal 20%, ostial PDA 50% stenoses   LV: 121/11 mmHg LVEDP: 23  mmHg   RA: 10 mmHg RV: 78/4 mmHg PA: 72/23 mmHg, mPAP 40 mmHg PCW: 24 mmHg   CO: 6.0 L/min CI: 2.4 L/min/m2   No indication for revascularization. Continue optimization of GDMT for HFrEF  EKG 08/21/2021: Sinus bradycardia 46 bpm First degree A-V block  Left anterior fascicular block   Echocardiogram 08/21/2021:  Left ventricle cavity is normal in size and wall thickness. Severe global  hypokinesis with akinetic apex. LVEF 25-30%. Doppler evidence of  grade III  (restrictive) diastolic dysfunction, elevated LAP.  Mild to moderate mitral regurgitation.  Normal right atrial pressure.  Compared to previous study on 09/18/2020, LVEF has further reduced from  around 35%.   ABI 08/21/2021:  This exam reveals normal perfusion of the right lower extremity (ABI  1.08).    This exam reveals normal perfusion of the left lower extremity (ABI 1.01).   Compared to the study done on 04/17/2020, left ABI has improved from 0.75.    No change in the right.  This represents successful revascularization of  the bilateral SFA by history.  Markedly abnormal monophasic waveform pattern in bilateral posterior  tibial artery.  Lexiscan Tetrofosmin stress test 09/18/2020: Lexiscan nuclear stress test performed using 1-day protocol. Patient experienced dyspnea and chest discomfort SPECT images showed large sized, moderate intensity, fixed perfusion defect in mid to apical, anterior, inferior, and apical myocardium. There is severe decrease in myocardial thickening and wall motion. Stress LVEF 12%. High risk study.  Cardiac MRI 02/01/2020: 1. Subendocardial late gadolinium enhancement consistent with prior infarct in LAD territory. There is >50% transmural LGE suggesting nonviability in mid anterior/anteroseptal walls, apical anterior/septal/inferior walls, and apex. 2. Mild LV dilatation with moderate systolic function (EF 62%). Akinesis of mid to apical anterior, mid anteroseptal, apical septal, apical inferior, and apex. 3.  Normal RV size and systolic function (EF 83%) 4.  Moderate mitral regurgitation (regurgitant fraction 24%) 5.  Moderate right pleural effusion   Coronary angiography 11/14/2019: LM: Diffuse 30% disease. IVUS MLA 7.3 mm2,. dFR 1.0 LAD: Patent prox LAD stent with udner expansion        Optimization with 3.5X15 mm [place] balloon at 22 atm        Excellent expansion as seen on IVUS        TIMI flow II-->III        Diag: Mod diffuse  disease LCx: 80% ostial OM1 stenosis. dFR 0.98 RCA: Not engaged today  Left Heart Catheterization 07/26/2019 (Dr. Einar Gip):  LM: Mild diffuse disease LAD: Very large caliber vessel, occluded at the proximal end.  Gives origin to high D1 which is large.  Successful PTCA and stenting of the proximal LAD overlapping D1, 3.5 x 24 mm Synergy DES deployed at 12 atmospheric pressure for 60 S. 100% to 0% with TIMI 0 to TIMI-3 flow. LCx: Large vessel, giving origin to a very large OM1 which is secondary branches.  Ostium of OM1 has 80% stenosis. RCA: Mild diffuse disease, distal RCA 15 to 20% stenosis, PDA 50% stenosis.   Recommendation: Patient will need aggressive risk factor modification, dual antiplatelet therapy with aspirin and Brilinta for a year, will be watched carefully in the intensive care unit for preshock, suspect his EDP is extremely high at the end of the procedure with excessive contrast use.  Will monitor for any contrast nephropathy as well.  Recent labs: 09/03/2021: Glucose 101, BUN/Cr 12/1.05. EGFR 80. Na/K 138/4.5. Chol 97, TG 105, HDL 32, LDL 45 NT-proBNP 502  10/29/2020: Glucose 86, BUN/Cr 12/1.08. EGFR  78. Na/K 141/4.2.  NT pro BNP 733  07/26/2019: Chol 230, TG 95, HDL 36, LDL 175   Review of Systems  Cardiovascular:  Negative for chest pain, dyspnea on exertion, leg swelling, palpitations and syncope.  Gastrointestinal:  Positive for diarrhea.         Vitals:   12/03/21 1109  BP: (!) 91/53  Pulse: (!) 51  Resp: 16  Temp: 98 F (36.7 C)  SpO2: 98%      Body mass index is 41.14 kg/m. Filed Weights   12/03/21 1109  Weight: 295 lb (133.8 kg)       Objective:   Physical Exam Vitals and nursing note reviewed.  Constitutional:      General: He is not in acute distress. Neck:     Vascular: No JVD.  Cardiovascular:     Rate and Rhythm: Normal rate and regular rhythm.     Pulses:          Dorsalis pedis pulses are 1+ on the left side.       Posterior  tibial pulses are 0 on the right side and 1+ on the left side.     Heart sounds: Normal heart sounds. No murmur heard. Pulmonary:     Effort: Pulmonary effort is normal.     Breath sounds: Normal breath sounds. No wheezing or rales.  Musculoskeletal:     Right lower leg: No edema.     Left lower leg: No edema.    VISIT DIAGNOSES:      ICD-10-CM   1. HFrEF (heart failure with reduced ejection fraction) (HCC)  I50.20 empagliflozin (JARDIANCE) 10 MG TABS tablet    Basic metabolic panel    Pro b natriuretic peptide (BNP)    Pro b natriuretic peptide (BNP)    Basic metabolic panel    2. Coronary artery disease of native artery of native heart with stable angina pectoris ([place])  I25.118      Orders Placed This Encounter  Procedures   Basic metabolic panel   Pro b natriuretic peptide (BNP)     Assessment & Recommendations:   60 y.o. 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 I confirmed with the patient. He is currently on Entesto to 49-51 mg bid, spironolactone 25 mg daily, metoprolol succinate-prefers 12.5 mg every other day, lasix 20 mg every other-not taking Jardiance. Considered not a candidate for Barostim clinical trial due to anatomical issues.  I recommend the following changes today. Instead of taking Lasix 20 mg every other day, recommend taking Jardiance 10 mg every other day.  To avoid hypotension, which seems to occur the morning after he takes metoprolol, I recommend taking Jardiance on the morning of the day he takes metoprolol.  PAD: Successful intervention to b/l CIA and Rt SFA for severe caludication (02/2020) Successful  intervention to Lt SFA ABI 0.8 b/l. Residual mild below the knee disease Stop Plavix 75 mg daily, start Aspirin 81 mg daily. Conitnue Xarelto 2.5 mg bid  CAD: Nonobstructive CAD, patent LAD stent (08/2021) Improved anginal symptoms on Imdur 15 mg daily.   Continue Plavix 75 mg daily, Xarelto 2.5 mg bid Continue Lipitor 80. LDL down to 64.  F/u in 4 weeks  [place], MD [facility] Pager:  [phone] Office: [phone]  

## 2021-12-04 NOTE — Progress Notes (Signed)
[place] patient 033  Screen failed due to bifurcation to high will see [facility] in clinic for commercial

## 2021-12-05 ENCOUNTER — Other Ambulatory Visit: Payer: Self-pay

## 2021-12-05 DIAGNOSIS — I5020 Unspecified systolic (congestive) heart failure: Secondary | ICD-10-CM

## 2021-12-05 MED ORDER — EMPAGLIFLOZIN 10 MG PO TABS: 10.0000 mg | ORAL_TABLET | ORAL | 3 refills | Status: DC

## 2021-12-08 ENCOUNTER — Telehealth (HOSPITAL_COMMUNITY): Payer: Self-pay

## 2021-12-08 NOTE — Telephone Encounter (Addendum)
Pt aware, agreeable, and verbalized understanding    ----- Message from Larey Dresser, MD sent at 12/02/2021  7:44 PM EDT ----- Minimal disease

## 2021-12-12 ENCOUNTER — Other Ambulatory Visit: Payer: Self-pay

## 2021-12-12 DIAGNOSIS — I5020 Unspecified systolic (congestive) heart failure: Secondary | ICD-10-CM

## 2021-12-12 MED ORDER — EMPAGLIFLOZIN 10 MG PO TABS: 10.0000 mg | ORAL_TABLET | ORAL | 3 refills | Status: DC

## 2021-12-15 ENCOUNTER — Encounter: Payer: Medicare HMO | Admitting: Surgery

## 2021-12-18 NOTE — Research (Signed)
Section A.   Administrative Section  Patient ID: [id number] - _[id number] - [id number] Patient Initials: _G_ _W_ _E_  Exit / Termination Date: __12__/_SEP__/_2023_                                            (DD / MMM / [name])  Section B.   Reason for Exit / Termination  '[x]'$  Screening/baseline failure  '[]'$  Failed Implant Attempt(s)  '[]'$  Completed all Study Required Visits  '[]'$  PI Withdrew  '[]'$  Subject withdrew consent (add comment below)  '[]'$  Subject refuses further follow-up testing (add comment below)  '[]'$  Lost to Follow-up  '[]'$  System Explanted (Complete Additional Procedure Worksheet)  '[]'$  Deceased (Complete Subject Death Worksheet)  '[]'$  Other, please specify: ________________________  Comments          Section C. Signature   Person completing form (Print Name): ___Kimberly Alba Destine :) _____  Signature: Philemon Kingdom :) ________ Date: __09/28/2023_______

## 2021-12-23 NOTE — Progress Notes (Signed)
Chronic Care Management Pharmacy Note Summary: PharmD FU.  Wanted to coordinate all recent med changes to make sure pharmacy was sending correctly and patient understood.  He confirmed all med changes - BP has been on lower end but was improving since changes.  Recommendations: No changes at this time  FU 6 months  01/02/2022 Name:  Jonathon Cain MRN:  [id number] DOB:  Mar 20, 1961  Subjective: Jonathon Cain is an 61 y.o. year old male who is a primary patient of Maximiano Coss, NP.  The CCM team was consulted for assistance with disease management and care coordination needs.    Engaged with patient by telephone for follow up visit in response to provider referral for pharmacy case management and/or care coordination services.   Consent to Services:  The patient was given the following information about Chronic Care Management services today, agreed to services, and gave verbal consent: 1. CCM service includes personalized support from designated clinical staff supervised by the primary care provider, including individualized plan of care and coordination with other care providers 2. 24/7 contact phone numbers for assistance for urgent and routine care needs. 3. Service will only be billed when office clinical staff spend 20 minutes or more in a month to coordinate care. 4. Only one practitioner may furnish and bill the service in a calendar month. 5.The patient may stop CCM services at any time (effective at the end of the month) by phone call to the office staff. 6. The patient will be responsible for cost sharing (co-pay) of up to 20% of the service fee (after annual deductible is met). Patient agreed to services and consent obtained.  Patient Care Team: Maximiano Coss, NP as PCP - General (Adult Health Nurse Practitioner) Nigel Mormon, MD as Consulting Physician (Cardiology) Peter Congo, MD as Referring Physician (Ophthalmology) Lavonna Monarch, MD (Inactive)  as Consulting Physician (Dermatology) Edythe Clarity, [facility] (Pharmacist)  Hospital visits: None in previous 6 months  Objective:  Lab Results  Component Value Date   CREATININE 0.79 12/02/2021   CREATININE 0.94 10/02/2021   CREATININE 1.05 09/03/2021    Lab Results  Component Value Date   HGBA1C 5.5 07/26/2019   Last diabetic Eye exam: No results found for: "HMDIABEYEEXA"  Last diabetic Foot exam: No results found for: "HMDIABFOOTEX"      Component Value Date/Time   CHOL 97 (L) 09/03/2021 1428   CHOL 111 05/15/2021 1158   CHOL 119 04/19/2020 0935   TRIG 105 09/03/2021 1428   TRIG 86 05/15/2021 1158   TRIG 75 04/19/2020 0935   HDL 32 (L) 09/03/2021 1428   HDL 37 (L) 05/15/2021 1158   HDL 40 04/19/2020 0935   CHOLHDL 3.0 05/15/2021 1158   CHOLHDL 6.4 07/26/2019 1854   VLDL 19 07/26/2019 1854   LDLCALC 45 09/03/2021 1428   LDLCALC 57 05/15/2021 1158   LDLCALC 64 04/19/2020 0935       Latest Ref Rng & Units 02/19/2020    2:48 PM 07/26/2019    6:54 PM 12/03/2016   10:16 AM  Hepatic Function  Total Protein 6.0 - 8.3 g/dL 7.4  6.9  7.1   Albumin 3.5 - 5.2 g/dL 3.9  3.0  2.9   AST 0 - 37 U/L 43  52  24   ALT 0 - 53 U/L 55  53  32   Alk Phosphatase 39 - 117 U/L 216  150  149   Total Bilirubin 0.2 - 1.2 mg/dL 1.1  0.8  0.44     No results found for: "TSH", "FREET4"     Latest Ref Rng & Units 09/16/2021   12:00 PM 09/16/2021   11:56 AM 09/16/2021   10:38 AM  CBC  WBC 4.0 - 10.5 K/uL   12.8   Hemoglobin 13.0 - 17.0 g/dL 14.3  14.6    13.9  14.7   Hematocrit 39.0 - 52.0 % 42.0  43.0    41.0  44.7   Platelets 150 - 400 K/uL   221     No results found for: "VD25OH"  Clinical ASCVD:  The ASCVD Risk score (Arnett DK, et al., 2019) failed to calculate for the following reasons:   The patient has a prior MI or stroke diagnosis   Social History   Tobacco Use  Smoking Status Former   Packs/day: 1.50   Years: 40.00   Total pack years: 60.00   Types: Cigarettes    Quit date: 07/26/2019   Years since quitting: 2.4  Smokeless Tobacco Never   BP Readings from Last 3 Encounters:  01/01/22 (!) 91/45  12/03/21 (!) 91/53  12/02/21 112/62   Pulse Readings from Last 3 Encounters:  01/01/22 (!) 47  12/03/21 (!) 51  12/02/21 60   Wt Readings from Last 3 Encounters:  01/01/22 290 lb (131.5 kg)  12/03/21 295 lb (133.8 kg)  12/02/21 289 lb (131.1 kg)   BMI Readings from Last 3 Encounters:  01/01/22 40.45 kg/m  12/03/21 41.14 kg/m  12/02/21 40.31 kg/m    Assessment: Review of patient past medical history, allergies, medications, health status, including review of consultants reports, laboratory and other test data, was performed as part of comprehensive evaluation and provision of chronic care management services.   SDOH:  (Social Determinants of Health) assessments and interventions performed: No, done within year Financial Resource Strain: Low Risk  (02/20/2021)   Overall Financial Resource Strain (CARDIA)    Difficulty of Paying Living Expenses: Not hard at all    SDOH Interventions    Flowsheet Row Clinical Support from 02/20/2021 in [place] Office Visit from 12/03/2020 in [place] from 09/19/2019 in [place]  SDOH Interventions     Food Insecurity Interventions Intervention Not Indicated -- --  Transportation Interventions Intervention Not Indicated -- --  Depression Interventions/Treatment  -- Patient refuses Treatment Patient refuses Treatment  Jonathon Cain has discussed with PCP and does not want to take antidepressants]  Financial Strain Interventions Intervention Not Indicated -- --  Physical Activity Interventions Intervention Not Indicated -- --  Stress Interventions Intervention Not Indicated -- --  Social Connections Interventions Intervention Not Indicated -- --       CCM Care  Plan  No Known Allergies  Medications Reviewed Today     Reviewed by Edythe Clarity, [facility] (Pharmacist) on 01/02/22 at [place] List Status: <None>   Medication Order Taking? Sig Documenting Provider Last Dose Status Informant  aspirin EC 81 MG tablet [id number] Yes Take 1 tablet (81 mg total) by mouth daily. Swallow whole. Patwardhan, Reynold Bowen, MD Taking Active   atorvastatin (LIPITOR) 80 MG tablet [id number] Yes TAKE ONE TABLET BY MOUTH EVERYDAY AT BEDTIME Patwardhan, Manish J, MD Taking Active   cetirizine (ZYRTEC) 10 MG tablet [id number] Yes Take 10 mg by mouth daily as needed for allergies. [provider] Taking Active Self    Discontinued 01/01/22 1111 empagliflozin (JARDIANCE) 10 MG TABS tablet [id number]  Yes Take 1 tablet (10 mg total) by mouth as directed. Take it every other day Take it on the morning that you take metoprolol at night Patwardhan, Reynold Bowen, MD Taking Active   ENTRESTO 49-51 MG [id number] Yes TAKE ONE TABLET BY MOUTH TWICE DAILY Patwardhan, Reynold Bowen, MD Taking Active   fluticasone (FLONASE) 50 MCG/ACT nasal spray [id number] Yes Place 2 sprays into both nostrils daily as needed for allergies or rhinitis.  [provider] Taking Active Self  furosemide (LASIX) 40 MG tablet [id number] Yes TAKE ONE TABLET BY MOUTH AS NEEDED. take ONLY IF you have more THAN usual LEG swelling Patwardhan, Manish J, MD Taking Active   guaiFENesin (MUCINEX) 600 MG 12 hr tablet [id number] Yes Take 600 mg by mouth 2 (two) times daily as needed (congestion/cough). [provider] Taking Active Self    Discontinued 01/01/22 1409 (Completed Course) isosorbide mononitrate (IMDUR) 30 MG 24 hr tablet [id number] Yes Take 0.5 tablets (15 mg total) by mouth daily. Patwardhan, Reynold Bowen, MD Taking Active   metoprolol succinate (TOPROL-XL) 25 MG 24 hr tablet [id number] Yes TAKE 1/2 TABLET BY MOUTH Every Other Day at Bedtime Nigel Mormon, MD Taking Active     Discontinued 01/01/22  1155 (Discontinued by provider)            Med Note>> Nigel Mormon, MD   01/01/2022 11:55 AM Hypotension    tamsulosin (FLOMAX) 0.4 MG CAPS capsule [id number] Yes Take 1 capsule (0.4 mg total) by mouth daily as needed (urine flow). Maximiano Coss, NP Taking Active Self  XARELTO 2.5 MG TABS tablet [id number] Yes TAKE ONE TABLET BY MOUTH EVERY MORNING and TAKE ONE TABLET BY MOUTH EVERY EVENING Patwardhan, Reynold Bowen, MD Taking Active Self           Patient Active Problem List   Diagnosis Date Noted   Sinusitis 01/08/2021   Edema 10/03/2020   Critical lower limb ischemia (HCC) 05/20/2020   Mixed hyperlipidemia 03/29/2020   Pre-ulcerative calluses 03/13/2020   Enrolled in clinical trial of drug 02/02/2020   PAD (peripheral artery disease) ([place]) 02/02/2020   HFrEF (heart failure with reduced ejection fraction) ([place]) 08/02/2019   Coronary artery disease of native artery of native heart with stable angina pectoris ([place]) 08/02/2019   Acute ST elevation myocardial infarction (STEMI) of anterolateral wall ([place]) 07/26/2019   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 10/20/2017   Leukocytosis 10/30/2016   Peripheral vision loss, bilateral 03/05/2016   Retinitis pigmentosa 12/11/2015   Acquired night blindness 08/08/2015   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   Epiretinal membrane (ERM) of both eyes 08/08/2015   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   Peripheral visual field defect of both eyes 08/08/2015   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   Posterior vitreous detachment of both eyes 08/08/2015   Pseudophakia of both eyes 08/08/2015    Immunization History  Administered Date(s) Administered   Influenza,inj,Quad PF,6+ Mos 12/03/2020   PFIZER(Purple Top)SARS-COV-2 Vaccination 12/14/2019, 01/04/2020   PNEUMOCOCCAL CONJUGATE-20 12/03/2020   Conditions to be addressed/monitored: HFrEF, CAD, PVD, HLD, Edema  Care Plan : ccm pharmacy care  plan  Updates made by Edythe Clarity, RPH since 01/02/2022 12:00 AM     Problem: pad cad   Priority: High     Long-Range Goal: disease management   Start Date: 11/13/2020  Expected End Date: 11/13/2021  Recent Progress: On track  Priority: High  Note:    Pharmacist Clinical Goal(s):  Patient will verbalize ability to afford  treatment regimen through collaboration with PharmD and provider.   Interventions: 1:1 collaboration with Maximiano Coss, NP regarding development and update of comprehensive plan of care as evidenced by provider attestation and co-signature Inter-disciplinary care team collaboration (see longitudinal plan of care) Comprehensive medication review performed; medication list updated in electronic medical record  Hypertension (BP goal <130/80) 01/01/22 -Controlled, lower end -Current treatment: Spironolactone 25 mg once daily Appropriate, Effective, Safe, Accessible Furosemide 40 mg as needed Appropriate, Effective, Safe, Accessible Entresto 49-51 twice daily Appropriate, Effective, Safe, Accessible Metoprolol succinate 12.5 mg twice daily Appropriate, Effective, Safe, Accessible -Current exercise habits: impaired vision interferes with exercise. Will walk in driveway some, feels sob is slowly improving compared to previous years, can walk longer distances.  -Denies hypotensive/hypertensive symptoms -Educated on BP goals and benefits of medications for prevention of heart attack, stroke and kidney damage; -Counseled to monitor BP at home 1-2x/wk, document, and provide log at future appointments -Recommended to continue current medication -BP trending on the lower end - patient is used to this. -Continue current doses for now, may need to reduce doses of meds in the future.  Hyperlipidemia: (LDL goal < 55-70 ) -Controlled, improved - LL 65 on 03/2020 down from 175 5/5 -PVD, CAD. 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 Secondary prevention on plavix, xarelto, asa, BB.  -Reports ongoing loose stools - black and tar like, that have persisted months. Last CBC 04/2020. Feels there has not been any acute changes. Previous PPI use (stopped 07/2020?). Discussed with PCP and CBC ordered - patient was to going to [facility] today so agreeable to getting added CBC.  -Current treatment: Atorvastatin 80 mg once daily Appropriate, Effective, Safe, Accessible -side effect review, tolerating well -Educated on Cholesterol goals;  -Recommended to continue current medication  Update 07/02/21 Most recent LDL continues to improve.  He reports adherence and is very pleased with most recent lipids. He does still mention persistent loose stools. Recent started Lomotil and has only had to use two doses so far. Continue to monitor, no changes at thist ime.  Heart Failure (Goal: manage symptoms and prevent exacerbations) 01/02/22 -Controlled -Last ejection fraction: 35% (Date: 08/2020) -HF type: HFrEF -NYHA Class: II -Current treatment: Spironolactone 25 mg once daily Appropriate, Effective, Safe, Accessible Entresto 49-51 twice daily Appropriate, Effective, Safe, Accessible Metoprolol succinate 12.5 mg twice daily Appropriate, Effective, Safe, Accessible Jardiance 10mg   Appropriate, Effective, Safe, Accessible -Educated on Importance of weighing daily; if you gain more than 3 pounds in one day or 5 pounds in one week, take furosemide -Recommended to continue current medication Counseled on daily weights -He denies any swelling at the moment.  BP is a little on the low end but denies dizziness.  He has always had BP on the lower end and is "normal" for him. No changes at this time, continue current treatment.    Update 07/02/21 Does still have occasional swelling.  Has gained some weight but does not feel it is due to fluid.  Taking Lasix prn when swelling is excessive.  Some SOB recently.  Have asked him to continue to monitor weight and other symptoms. Contact providers if  swelling or SOB worsens. No changes at this time.  I do believe he would qualify for Entresto PAP so we started that process today.  Patient Goals/Self-Care Activities Patient will:  - take medications as prescribed collaborate with provider on medication access solutions  Medication Assistance: None needed at this visit   Patient's preferred pharmacy is:  [place], [place] -  [address] [place] [address] Phone: [phone] Fax: [phone]           Compliance/Adherence/Medication fill history: Care Gaps: None  Star-Rating Drugs: Atorvastatin 80mg  12/02/21 90ds Jardiance 10mg  12/05/21 90ds  Pt endorses 100% compliance  Follow Up:  Patient agrees to Care Plan and Follow-up. Plan:FU 6 months, CMA to check on BP  Future Appointments  Date Time Provider [place]  01/02/2022  3:00 PM [facility] 1 [facility]-CT [facility]  03/11/2022 11:00 AM Patwardhan, Reynold Bowen, MD PCV-PCV None   Beverly Milch, PharmD Clinical Pharmacist  [facility] [phone]

## 2021-12-31 ENCOUNTER — Ambulatory Visit: Payer: Medicare HMO | Admitting: Pharmacist

## 2021-12-31 DIAGNOSIS — I5020 Unspecified systolic (congestive) heart failure: Secondary | ICD-10-CM

## 2021-12-31 DIAGNOSIS — E782 Mixed hyperlipidemia: Secondary | ICD-10-CM

## 2022-01-01 ENCOUNTER — Encounter: Payer: Self-pay | Admitting: Cardiology

## 2022-01-01 ENCOUNTER — Ambulatory Visit: Payer: Medicare HMO | Admitting: Cardiology

## 2022-01-01 VITALS — BP 91/45 | HR 47 | Temp 98.0°F | Resp 16 | Ht 71.0 in | Wt 290.0 lb

## 2022-01-01 DIAGNOSIS — I25118 Atherosclerotic heart disease of native coronary artery with other forms of angina pectoris: Secondary | ICD-10-CM

## 2022-01-01 DIAGNOSIS — I739 Peripheral vascular disease, unspecified: Secondary | ICD-10-CM

## 2022-01-01 DIAGNOSIS — I5020 Unspecified systolic (congestive) heart failure: Secondary | ICD-10-CM | POA: Diagnosis not present

## 2022-01-01 NOTE — Progress Notes (Signed)
Patient referred by Maximiano Coss, NP for CAD  Subjective:   Jonathon Cain, male    DOB: 01-26-1961, 61 y.o.   MRN: [id number]   Chief Complaint  Patient presents with   HFrEF   Follow-up    4 week     HPI   61 y.o. 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  At last visit, I started Jardiance 10 mg every other day. He has had occasional lightheadedness, denies syncope. He denies any significant leg edema, orthopnea, PND.   He is legally blind, gets his medications in a pill box set for 3 months.     Current Outpatient Medications:    aspirin EC 81 MG tablet, Take 1 tablet (81 mg total) by mouth daily. Swallow whole., Disp: 90 tablet, Rfl: 3   atorvastatin (LIPITOR) 80 MG tablet, TAKE ONE TABLET BY MOUTH EVERYDAY AT BEDTIME, Disp: 103 tablet, Rfl: 0   cetirizine (ZYRTEC) 10 MG tablet, Take 10 mg by mouth daily as needed for allergies., Disp: , Rfl:    diphenoxylate-atropine (LOMOTIL) 2.5-0.025 MG tablet, Take 1 tablet by mouth 4 (four) times daily as needed for diarrhea or loose stools., Disp: 30 tablet, Rfl: 0   empagliflozin (JARDIANCE) 10 MG TABS tablet, Take 1 tablet (10 mg total) by mouth as directed. Take it every other day Take it on the morning that you take metoprolol at night, Disp: 60 tablet, Rfl: 3   ENTRESTO 49-51 MG, TAKE ONE TABLET BY MOUTH TWICE DAILY, Disp: 180 tablet, Rfl: 3   fluticasone (FLONASE) 50 MCG/ACT nasal spray, Place 2 sprays into both nostrils daily as needed for allergies or rhinitis. , Disp: , Rfl:    furosemide (LASIX) 40 MG tablet, TAKE ONE TABLET BY MOUTH AS NEEDED. take ONLY IF you have more THAN usual LEG swelling, Disp: 30 tablet, Rfl: 2   guaiFENesin (MUCINEX) 600 MG 12 hr tablet, Take 600 mg by mouth 2 (two) times daily as needed  (congestion/cough)., Disp: , Rfl:    Investigational - Study Medication, Study name: EMPACT MI Additional study details: EMPACT-MI: A Streamlined, Multicentre, Randomised, Parallel Group, Double-blind Placebo-controlled Superiority Trial to Evaluate the Effect of EMPAgliflozin on Hospitalisation for Heart Failure and Mortality in Patients With aCuTe Myocardial Infarction, Disp: , Rfl:    isosorbide mononitrate (IMDUR) 30 MG 24 hr tablet, Take 0.5 tablets (15 mg total) by mouth daily., Disp: 30 tablet, Rfl: 3   metoprolol succinate (TOPROL-XL) 25 MG 24 hr tablet, TAKE 1/2 TABLET BY MOUTH Every Other Day at Bedtime, Disp: 45 tablet, Rfl: 2   spironolactone (ALDACTONE) 25 MG tablet, TAKE ONE TABLET BY MOUTH EVERY MORNING, Disp: 90 tablet, Rfl: 2   tamsulosin (FLOMAX) 0.4 MG CAPS capsule, Take 1 capsule (0.4 mg total) by mouth daily as needed (urine flow)., Disp: 90 capsule, Rfl: 1   XARELTO 2.5 MG TABS tablet, TAKE ONE TABLET BY MOUTH EVERY MORNING and TAKE ONE TABLET BY MOUTH EVERY EVENING, Disp: 180 tablet, Rfl: 1    Cardiovascular and other pertinent studies:  RHC/LHC 09/16/2021: LM: Normal LAD: Patent prox LAD stent with 40% late lumen loss         RFR 0.94 (physiologically non-significant) Lcx: OM1 ostial 50% stenosis (Improved from previous cath in 2021-80% with negative RFR then) RCA: Distal 20%, ostial PDA 50% stenoses   LV: 121/11 mmHg  LVEDP: 23 mmHg   RA: 10 mmHg RV: 78/4 mmHg PA: 72/23 mmHg, mPAP 40 mmHg PCW: 24 mmHg   CO: 6.0 L/min CI: 2.4 L/min/m2   No indication for revascularization. Continue optimization of GDMT for HFrEF  EKG 08/21/2021: Sinus bradycardia 46 bpm First degree A-V block  Left anterior fascicular block   Echocardiogram 08/21/2021:  Left ventricle cavity is normal in size and wall thickness. Severe global  hypokinesis with akinetic apex. LVEF 25-30%. Doppler evidence of grade III  (restrictive) diastolic dysfunction, elevated LAP.  Mild to moderate  mitral regurgitation.  Normal right atrial pressure.  Compared to previous study on 09/18/2020, LVEF has further reduced from  around 35%.   ABI 08/21/2021:  This exam reveals normal perfusion of the right lower extremity (ABI  1.08).    This exam reveals normal perfusion of the left lower extremity (ABI 1.01).   Compared to the study done on 04/17/2020, left ABI has improved from 0.75.    No change in the right.  This represents successful revascularization of  the bilateral SFA by history.  Markedly abnormal monophasic waveform pattern in bilateral posterior  tibial artery.  Lexiscan Tetrofosmin stress test 09/18/2020: Lexiscan nuclear stress test performed using 1-day protocol. Patient experienced dyspnea and chest discomfort SPECT images showed large sized, moderate intensity, fixed perfusion defect in mid to apical, anterior, inferior, and apical myocardium. There is severe decrease in myocardial thickening and wall motion. Stress LVEF 12%. High risk study.  Cardiac MRI 02/01/2020: 1. Subendocardial late gadolinium enhancement consistent with prior infarct in LAD territory. There is >50% transmural LGE suggesting nonviability in mid anterior/anteroseptal walls, apical anterior/septal/inferior walls, and apex. 2. Mild LV dilatation with moderate systolic function (EF 20%). Akinesis of mid to apical anterior, mid anteroseptal, apical septal, apical inferior, and apex. 3.  Normal RV size and systolic function (EF 25%) 4.  Moderate mitral regurgitation (regurgitant fraction 24%) 5.  Moderate right pleural effusion   Coronary angiography 11/14/2019: LM: Diffuse 30% disease. IVUS MLA 7.3 mm2,. dFR 1.0 LAD: Patent prox LAD stent with udner expansion        Optimization with 3.5X15 mm [place] balloon at 22 atm        Excellent expansion as seen on IVUS        TIMI flow II-->III        Diag: Mod diffuse disease LCx: 80% ostial OM1 stenosis. dFR 0.98 RCA: Not engaged today  Left Heart  Catheterization 07/26/2019 (Dr. Einar Gip):  LM: Mild diffuse disease LAD: Very large caliber vessel, occluded at the proximal end.  Gives origin to high D1 which is large.  Successful PTCA and stenting of the proximal LAD overlapping D1, 3.5 x 24 mm Synergy DES deployed at 12 atmospheric pressure for 60 S. 100% to 0% with TIMI 0 to TIMI-3 flow. LCx: Large vessel, giving origin to a very large OM1 which is secondary branches.  Ostium of OM1 has 80% stenosis. RCA: Mild diffuse disease, distal RCA 15 to 20% stenosis, PDA 50% stenosis.   Recommendation: Patient will need aggressive risk factor modification, dual antiplatelet therapy with aspirin and Brilinta for a year, will be watched carefully in the intensive care unit for [place]hock, suspect his EDP is extremely high at the end of the procedure with excessive contrast use.  Will monitor for any contrast nephropathy as well.  Recent labs: 09/03/2021: Glucose 101, BUN/Cr 12/1.05. EGFR 80. Na/K 138/4.5. Chol 97, TG 105, HDL 32, LDL 45 NT-proBNP 502  10/29/2020: Glucose 86, BUN/Cr  12/1.08. EGFR 78. Na/K 141/4.2.  NT pro BNP 733  07/26/2019: Chol 230, TG 95, HDL 36, LDL 175   Review of Systems  Cardiovascular:  Negative for chest pain, dyspnea on exertion, leg swelling, palpitations and syncope.  Gastrointestinal:  Positive for diarrhea.         Vitals:   01/01/22 1113 01/01/22 1136  BP: (!) 77/46 (!) 91/45  Pulse: (!) 49 (!) 47  Resp:    Temp:    SpO2:        Body mass index is 40.45 kg/m. Filed Weights   01/01/22 1110  Weight: 290 lb (131.5 kg)       Objective:   Physical Exam Vitals and nursing note reviewed.  Constitutional:      General: He is not in acute distress. Neck:     Vascular: No JVD.  Cardiovascular:     Rate and Rhythm: Normal rate and regular rhythm.     Pulses:          Dorsalis pedis pulses are 1+ on the left side.       Posterior tibial pulses are 0 on the right side and 1+ on the left side.     Heart  sounds: Normal heart sounds. No murmur heard. Pulmonary:     Effort: Pulmonary effort is normal.     Breath sounds: Normal breath sounds. No wheezing or rales.  Musculoskeletal:     Right lower leg: No edema.     Left lower leg: No edema.     VISIT DIAGNOSES:      ICD-10-CM   1. HFrEF (heart failure with reduced ejection fraction) (HCC)  I50.20     2. Coronary artery disease of native artery of native heart with stable angina pectoris ([place])  I25.118     3. PAD (peripheral artery disease) (HCC)  I73.9        Assessment & Recommendations:   61 y.o. 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  BP did improve after drinking water in the office. Not overly symptomatic at this time.  Given hypotension, recommend increased hydration.  In addition, I will stop his spironolactone.  In future, may reduce Entresto to 24-26 mg bid. If possible, continue Jardiance 10 mg every other day, alternating with metoprolol succinate 12.5 mg daily.   Arranged for remote patient monitoring and principal care management through pur pharmacist Haynes Dage.  Considered not a candidate for Barostim clinical trial due to anatomical issues.  PAD: Successful intervention to b/l CIA and Rt SFA for severe caludication (02/2020) Successful intervention to Lt SFA ABI 0.8 b/l. Residual mild below the knee disease Continue Aspirin 81 mg daily, Xarelto 2.5 mg bid  CAD: Nonobstructive CAD, patent LAD stent (08/2021) Improved anginal symptoms on Imdur 15 mg daily.   Continue Plavix 75 mg daily, Xarelto 2.5 mg bid Continue Lipitor 80. LDL down to 64.  F/u in 2  months   Nigel Mormon, MD Pager: [phone] Office: [phone]

## 2022-01-01 NOTE — Addendum Note (Signed)
Addended by: Nigel Mormon on: 01/01/2022 02:09 PM   Modules accepted: Orders

## 2022-01-02 ENCOUNTER — Ambulatory Visit (HOSPITAL_BASED_OUTPATIENT_CLINIC_OR_DEPARTMENT_OTHER)
Admission: RE | Admit: 2022-01-02 | Discharge: 2022-01-02 | Disposition: A | Discharge status: Home / Self Care | Payer: Medicare HMO | Source: Ambulatory Visit | Attending: Cardiology | Admitting: Cardiology

## 2022-01-02 DIAGNOSIS — Z87891 Personal history of nicotine dependence: Secondary | ICD-10-CM | POA: Diagnosis not present

## 2022-01-02 NOTE — Patient Instructions (Addendum)
Visit Information   Goals Addressed   None    Patient Care Plan: [facility] pharmacy care plan     Problem Identified: pad cad   Priority: High     Long-Range Goal: disease management   Start Date: 11/13/2020  Expected End Date: 11/13/2021  This Visit's Progress: On track  Priority: High  Note:    Pharmacist Clinical Goal(s):  Patient will verbalize ability to afford treatment regimen through collaboration with PharmD and provider.   Interventions: 1:1 collaboration with Maximiano Coss, NP regarding development and update of comprehensive plan of care as evidenced by provider attestation and co-signature Inter-disciplinary care team collaboration (see longitudinal plan of care) Comprehensive medication review performed; medication list updated in electronic medical record  Hypertension/HFrEF (BP goal <130/80) -Controlled -Current treatment: Farxiga 10 mg once daily Spironolactone 25 mg once daily Furosemide 40 mg as needed  Entresto 49-51 twice daily  Metoprolol succinate 12.5 mg twice daily -Current exercise habits: impaired vision interferes with exercise. Will walk in driveway some, feels sob is slowly improving compared to previous years, can walk longer distances.  -Denies hypotensive/hypertensive symptoms -Educated on BP goals and benefits of medications for prevention of heart attack, stroke and kidney damage; -Counseled to monitor BP at home 1-2x/wk, document, and provide log at future appointments -Recommended to continue current medication  Hyperlipidemia: (LDL goal < 55-70 ) -Controlled, improved - LL 65 on 03/2020 down from 175 5/5 -PVD, CAD. 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 Secondary prevention on plavix, xarelto, asa, BB. -Reports ongoing loose stools - black and tar like, that have persisted months. Last CBC 04/2020. Feels there has not been any acute changes. Previous PPI use (stopped 07/2020?). Discussed with PCP and CBC ordered - patient was to going to [facility] today so  agreeable to getting added CBC.  -Current treatment: Atorvastatin 80 mg once daily -side effect review, tolerating well -Educated on Cholesterol goals;  -Recommended to continue current medication  Heart Failure (Goal: manage symptoms and prevent exacerbations) -Controlled -Last ejection fraction: 35% (Date: 08/2020) -HF type: HFrEF -NYHA Class: II -Current treatment: See htn -Educated on Importance of weighing daily; if you gain more than 3 pounds in one day or 5 pounds in one week, take furosemide -Recommended to continue current medication Counseled on daily weights  Patient Goals/Self-Care Activities Patient will:  - take medications as prescribed collaborate with provider on medication access solutions  Medication Assistance: will pursue PAP for Entresto and Farxiga. Verbal consent for enrollment in upstream pharmacy services obtained.   Patient's preferred pharmacy is:  [facility] - [place], [place] - [address] [place] [address] Phone: [phone] Fax: [phone]    Patient Care Plan: General Pharmacy (Adult)     Problem Identified: HFrEF, CAD, PVD, HLD, Edema   Priority: High  Onset Date: 07/02/2021     Long-Range Goal: Patient-Specific Goal   Start Date: 07/02/2021  Expected End Date: 07/03/2022  This Visit's Progress: On track  Priority: High  Note:    Pharmacist Clinical Goal(s):  Patient will verbalize ability to afford treatment regimen through collaboration with PharmD and provider.   Interventions: 1:1 collaboration with Maximiano Coss, NP regarding development and update of comprehensive plan of care as evidenced by provider attestation and co-signature Inter-disciplinary care team collaboration (see longitudinal plan of care) Comprehensive medication review performed; medication list updated in electronic medical record  Hypertension (BP goal <130/80) -Controlled -Current  treatment: Spironolactone 25 mg once daily Furosemide 40 mg  as needed  Entresto 49-51 twice daily  Metoprolol succinate 12.5 mg twice daily -Current exercise habits: impaired vision interferes with exercise. Will walk in driveway some, feels sob is slowly improving compared to previous years, can walk longer distances.  -Denies hypotensive/hypertensive symptoms -Educated on BP goals and benefits of medications for prevention of heart attack, stroke and kidney damage; -Counseled to monitor BP at home 1-2x/wk, document, and provide log at future appointments -Recommended to continue current medication  Hyperlipidemia: (LDL goal < 55-70 ) -Controlled, improved - LL 65 on 03/2020 down from 175 5/5 -PVD, CAD. 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 Secondary prevention on plavix, xarelto, asa, BB. -Reports ongoing loose stools - black and tar like, that have persisted months. Last CBC 04/2020. Feels there has not been any acute changes. Previous PPI use (stopped 07/2020?). Discussed with PCP and CBC ordered - patient was to going [facility] today so agreeable to getting added CBC.  -Current treatment: Atorvastatin 80 mg once daily Appropriate, Effective, Safe, Accessible -side effect review, tolerating well -Educated on Cholesterol goals;  -Recommended to continue current medication  Update 07/02/21 Most recent LDL continues to improve.  He reports adherence and is very pleased with most recent lipids. He does still mention persistent loose stools. Recent started Lomotil and has only had to use two doses so far. Continue to monitor, no changes at thist ime.  Heart Failure (Goal: manage symptoms and prevent exacerbations) -Controlled -Last ejection fraction: 35% (Date: 08/2020) -HF type: HFrEF -NYHA Class: II -Current treatment: Spironolactone 25 mg once daily Appropriate, Effective, Safe, Accessible Furosemide 40 mg as needed Appropriate, Effective, Safe, Accessible Entresto 49-51 twice daily  Appropriate, Effective, Safe, Accessible Metoprolol succinate 12.5 mg twice daily Appropriate, Effective, Safe, Accessible Investigational drug (Jardiance) Appropriate, Effective, Safe, Accessible -Educated on Importance of weighing daily; if you gain more than 3 pounds in one day or 5 pounds in one week, take furosemide -Recommended to continue current medication Counseled on daily weights  Update 07/02/21 Does still have occasional swelling.  Has gained some weight but does not feel it is due to fluid.  Taking Lasix prn when swelling is excessive.  Some SOB recently.  Have asked him to continue to monitor weight and other symptoms. Contact providers if swelling or SOB worsens. No changes at this time.  I do believe he would qualify for Entresto PAP so we started that process today.  Patient Goals/Self-Care Activities Patient will:  - take medications as prescribed collaborate with provider on medication access solutions  Medication Assistance: will pursue PAP for Entresto.. Verbal consent for enrollment in upstream pharmacy services obtained.   Patient's preferred pharmacy is:  [facility] - [place], [place] - [address] [place] [address] Phone: [phone] Fax: [phone]         The patient verbalized understanding of instructions, educational materials, and care plan provided today and DECLINED offer to receive copy of patient instructions, educational materials, and care plan.  Telephone follow up appointment with pharmacy team member scheduled for: 6 months  Edythe Clarity, [place], PharmD Clinical Pharmacist  [facility] [phone]

## 2022-01-06 ENCOUNTER — Telehealth: Payer: Self-pay

## 2022-01-06 NOTE — Telephone Encounter (Signed)
Patient says since he did not qualify for the Research Study, he does not think the benefits would outweigh the risks of surgery.  H said he already walks quite a good distance without the Barostim device and will "hold off" on trying to get the device approved for now.  I advised patient that we will respect his wishes at this time.

## 2022-01-08 ENCOUNTER — Telehealth: Payer: Self-pay

## 2022-01-08 NOTE — Telephone Encounter (Signed)
Called and left voicemail with patient for RPM 1 week follow-up. Will continue to monitor and speak with patient.   BP Summary Average Systolic BP Level 98 mmHg Lowest Systolic BP Level 83 mmHg Highest Systolic BP Level 161 mmHg  Average Diastolic BP Level 09.60 mmHg Lowest Diastolic BP Level 48 mmHg Highest Diastolic BP Level 84 mmHg   BP Readings 01/07/2022 Wednesday at 08:32 AM 96 / 66      01/06/2022 Tuesday at 11:59 AM 97 / 65      01/06/2022 Tuesday at 07:29 AM 99 / 53      01/05/2022 Monday at 12:20 PM 100 / 57      01/05/2022 Monday at 12:17 PM 100 / 72      01/04/2022 'Sunday at 08:50 AM 105 / 67      01/03/2022 Saturday at 12:13 PM 88 / 54      01/02/2022 Friday at 12:42 PM 96 / 61      01/02/2022 Friday at 12:36 PM 97 / 60      01/01/2022 Thursday at 12:07 PM 99 / 65      01/01/2022 Thursday at 12:00 PM 116 / 84      10'$ /02/2022 Thursday at 11:59 AM 83 / 48

## 2022-01-13 NOTE — Telephone Encounter (Signed)
Better. Will monitor for now.  Thanks MJP

## 2022-01-19 ENCOUNTER — Ambulatory Visit: Payer: Medicare HMO | Admitting: Dermatology

## 2022-01-26 DIAGNOSIS — I5020 Unspecified systolic (congestive) heart failure: Secondary | ICD-10-CM | POA: Diagnosis not present

## 2022-02-09 ENCOUNTER — Other Ambulatory Visit: Payer: Self-pay

## 2022-02-09 DIAGNOSIS — I5020 Unspecified systolic (congestive) heart failure: Secondary | ICD-10-CM

## 2022-02-09 MED ORDER — ENTRESTO 24-26 MG PO TABS: 1.0000 | ORAL_TABLET | Freq: Two times a day (BID) | ORAL | 5 refills | Status: DC

## 2022-02-09 NOTE — Progress Notes (Signed)
Per discussion with Dr. Virgina Jock - reducing Jonathon Cain strength due to soft BP  02/08/2022 'Sunday at 12:23 PM 105 / 60      02/08/2022 Sunday at 09:37 AM 94 / 62      02/08/2022 Sunday at 09:34 AM 90 / 59      02/07/2022 Saturday at 03:21 PM 106 / 66      02/06/2022 Friday at 10:51 AM 110 / 66      02/05/2022 Thursday at 10:21 AM 110 / 70      11'$ /16/2023 Thursday at 10:19 AM 90 / 51  Medications Discontinued During This Encounter  Medication Reason   ENTRESTO 49-51 MG Dose change

## 2022-02-20 ENCOUNTER — Other Ambulatory Visit: Payer: Self-pay | Admitting: Cardiology

## 2022-02-23 ENCOUNTER — Telehealth: Payer: Self-pay | Admitting: Pharmacist

## 2022-02-23 ENCOUNTER — Telehealth: Payer: Self-pay | Admitting: Acute Care

## 2022-02-23 DIAGNOSIS — Z87891 Personal history of nicotine dependence: Secondary | ICD-10-CM

## 2022-02-23 DIAGNOSIS — Z122 Encounter for screening for malignant neoplasm of respiratory organs: Secondary | ICD-10-CM

## 2022-02-23 NOTE — Telephone Encounter (Signed)
CT result letter sent via [place]. CT results faxed to PCP. Order placed for 12 mth follow up lung screening CT.

## 2022-02-23 NOTE — Progress Notes (Signed)
Chronic Care Management Pharmacy Assistant   Name: Jonathon Cain  MRN: [id number] DOB: [birth year]   Reason for Encounter: Monthly Medication Coordination Call      Medications: Outpatient Encounter Medications as of 02/23/2022  Medication Sig   aspirin EC 81 MG tablet Take 1 tablet (81 mg total) by mouth daily. Swallow whole.   atorvastatin (LIPITOR) 80 MG tablet TAKE ONE TABLET BY MOUTH EVERYDAY AT BEDTIME   cetirizine (ZYRTEC) 10 MG tablet Take 10 mg by mouth daily as needed for allergies.   empagliflozin (JARDIANCE) 10 MG TABS tablet Take 1 tablet (10 mg total) by mouth as directed. Take it every other day Take it on the morning that you take metoprolol at night   fluticasone (FLONASE) 50 MCG/ACT nasal spray Place 2 sprays into both nostrils daily as needed for allergies or rhinitis.    furosemide (LASIX) 40 MG tablet TAKE ONE TABLET BY MOUTH AS NEEDED. take ONLY IF you have more THAN usual LEG swelling   guaiFENesin (MUCINEX) 600 MG 12 hr tablet Take 600 mg by mouth 2 (two) times daily as needed (congestion/cough).   isosorbide mononitrate (IMDUR) 30 MG 24 hr tablet Take 0.5 tablets (15 mg total) by mouth daily.   metoprolol succinate (TOPROL-XL) 25 MG 24 hr tablet TAKE 1/2 TABLET BY MOUTH Every Other Day at Bedtime   sacubitril-valsartan (ENTRESTO) 24-26 MG Take 1 tablet by mouth 2 (two) times daily.   tamsulosin (FLOMAX) 0.4 MG CAPS capsule Take 1 capsule (0.4 mg total) by mouth daily as needed (urine flow).   XARELTO 2.5 MG TABS tablet TAKE ONE TABLET BY MOUTH EVERY MORNING and TAKE ONE TABLET BY MOUTH EVERY EVENING   No facility-administered encounter medications on file as of 02/23/2022.    Reviewed chart for medication changes ahead of medication coordination call.  No OVs, Consults, or hospital visits since last care coordination call/Pharmacist visit. (If appropriate, list visit date, provider name)  No medication changes indicated OR if recent visit, treatment plan  here.  BP Readings from Last 3 Encounters:  01/01/22 (!) 91/45  12/03/21 (!) 91/53  12/02/21 112/62    Lab Results  Component Value Date   HGBA1C 5.5 07/26/2019     Patient obtains medications through Adherence Packaging  90 Days   Last adherence delivery included: (medication name and frequency)  Spirolactone 25 mg 1 tablet daily Entresto 49/51 1 tablet daily Metoprolol ER 25 mg  1/2 tablet every other day Atorvastatin 80 mg 1 tablet daily Clopidogrel 75 mg 1 tablet daily   Cetrizine 10 mg 1 tablet daily Furosemide 40 mg 1 tablet daily as need (in vial) Isosorbide Mono ER 30 mg 1/2 tablet breakfast (new) Jardiance 10 mg 1 tablet daily before breakfast (new) Lomotil as needed  4 times a day  Patient is due for next adherence delivery on: 03/05/22. Called patient and reviewed medications and coordinated delivery.  This delivery to include:   Entresto 24/26 1 tablet BID (dose change) Metoprolol ER 25 mg  1/2 tablet every other day same day as Jardiance Atorvastatin 80 mg 1 tablet daily Cetrizine 10 mg 1 tablet daily Furosemide 40 mg 1 tablet daily as need (in vial) Isosorbide Mono ER 30 mg 1/2 tablet breakfast (new) Jardiance 10 mg 1 tablet every other day before breakfast (new) (same day as Metoprolol) Xarelto 2.5 1 tablet daily Aspirin 81 mg 1 tablet daily in am Lomotil as needed  4 times a day   Patient needs refills for: 90  day supply from specialist Isosorbide Mono ER 30 mg 1/2 tablet breakfast (new) Atorvastatin 80 mg 1 tablet daily  Confirmed delivery date of 03/05/22, advised patient that pharmacy will contact them the morning of delivery.   Care Gaps   AWV: done 02/20/21 Colonoscopy: done 06/28/28 DM Eye Exam: N/A DM Foot Exam: N/A Microalbumin: unknown HbgAIC: done 07/26/19 (5.5) DEXA: N/A Mammogram: N/A     Star Rating Drugs: Atorvastatin (LIPITOR) 80 MG tablet - last filled 09/02/21 90 days     Future Appointments  Date Time Provider  [place]  02/25/2022  1:30 PM de [place], Raymond J, MD [facility]  03/11/2022 11:00 AM Patwardhan, Reynold Bowen, MD [facility] None     [name], [facility] Clinical Pharmacist Assistant  [phone]

## 2022-02-23 NOTE — Telephone Encounter (Signed)
12 month follow up for this patient. 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 Emphysema is related to his smoking history.  Thanks so much

## 2022-02-25 ENCOUNTER — Encounter (HOSPITAL_BASED_OUTPATIENT_CLINIC_OR_DEPARTMENT_OTHER): Payer: Self-pay | Admitting: Family Medicine

## 2022-02-25 ENCOUNTER — Ambulatory Visit (INDEPENDENT_AMBULATORY_CARE_PROVIDER_SITE_OTHER): Payer: Medicare HMO | Admitting: Family Medicine

## 2022-02-25 VITALS — BP 108/66 | HR 52 | Temp 97.7°F | Ht 71.0 in | Wt 297.3 lb

## 2022-02-25 DIAGNOSIS — Z0000 Encounter for general adult medical examination without abnormal findings: Secondary | ICD-10-CM

## 2022-02-25 DIAGNOSIS — I5020 Unspecified systolic (congestive) heart failure: Secondary | ICD-10-CM

## 2022-02-25 DIAGNOSIS — Z125 Encounter for screening for malignant neoplasm of prostate: Secondary | ICD-10-CM

## 2022-02-25 NOTE — Assessment & Plan Note (Signed)
Recommend continued close follow-up with cardiologist.  Continue with current medications as prescribed.

## 2022-02-25 NOTE — Patient Instructions (Signed)
  Medication Instructions:  Your physician recommends that you continue on your current medications as directed. Please refer to the Current Medication list given to you today. --If you need a refill on any your medications before your next appointment, please call your pharmacy first. If no refills are authorized on file call the office.-- Lab Work: Your physician has recommended that you have lab work today: No If you have labs (blood work) drawn today and your tests are completely normal, you will receive your results via [place] a phone call from our staff.  Please ensure you check your voicemail in the event that you authorized detailed messages to be left on a delegated number. If you have any lab test that is abnormal or we need to change your treatment, we will call you to review the results.  Referrals/Procedures/Imaging: No  Follow-Up: Your next appointment:   Your physician recommends that you schedule a follow-up appointment in: 1-2 months physical with Dr. de [place].  You will receive a text message or e-mail with a link to a survey about your care and experience with [place] today! We would greatly appreciate your feedback!   Thanks for letting us be apart of your health journey!!  Primary Care and Sports Medicine   Dr. Arlina Robes [place]   We encourage you to activate your patient portal called "MyChart".  Sign up information is provided on this After Visit Summary.  MyChart is used to connect with patients for Virtual Visits (Telemedicine).  Patients are able to view lab/test results, encounter notes, upcoming appointments, etc.  Non-urgent messages can be sent to your provider as well. To learn more about what you can do with MyChart, please visit --  [email or web address].

## 2022-02-25 NOTE — Progress Notes (Signed)
New Patient Office Visit  Subjective    Patient ID: Jonathon Cain, male    DOB: Apr 13, 1960  Age: 61 y.o. MRN: [id number]  CC:  Chief Complaint  Patient presents with   New Patient (Initial Visit)    Pt here to establish new care     HPI [name] presents to establish care Last PCP - Maximiano Coss, last visit some time ago  HTN/HFrEF/CAD/STEMI treated with prior PCI: Patient follows with Dr. Virgina Jock.  Current medications include ASA 81, atorvastatin, empagliflozin, metoprolol, Entresto.  He does have regular follow-up as scheduled with his cardiology office.  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  He additionally does report that he will have CT scan completed annually for lung cancer screening.  It does appear that this has been ordered through [facility] pulmonology previously, however he does not seem to recall having a pulmonologist that he sees regularly.  Most recent CT scan was completed about 1 to 2 months ago with recommendations for continued annual monitoring  Patient is originally from [place] - moved here when young. Patient enjoys watching TV, reports activities are limited due to chronic medical issues.  Outpatient Encounter Medications as of 02/25/2022  Medication Sig   aspirin EC 81 MG tablet Take 1 tablet (81 mg total) by mouth daily. Swallow whole.   atorvastatin (LIPITOR) 80 MG tablet TAKE ONE TABLET BY MOUTH EVERYDAY AT BEDTIME   cetirizine (ZYRTEC) 10 MG tablet Take 10 mg by mouth daily as needed for allergies.   empagliflozin (JARDIANCE) 10 MG TABS tablet Take 1 tablet (10 mg total) by mouth as directed. Take it every other day Take it on the morning that you take metoprolol at night   fluticasone (FLONASE) 50 MCG/ACT nasal spray Place 2 sprays into both nostrils daily as needed for allergies or rhinitis.    furosemide (LASIX) 40 MG tablet TAKE ONE TABLET BY MOUTH AS NEEDED.  take ONLY IF you have more THAN usual LEG swelling   isosorbide mononitrate (IMDUR) 30 MG 24 hr tablet Take 0.5 tablets (15 mg total) by mouth daily.   metoprolol succinate (TOPROL-XL) 25 MG 24 hr tablet TAKE 1/2 TABLET BY MOUTH Every Other Day at Bedtime   sacubitril-valsartan (ENTRESTO) 24-26 MG Take 1 tablet by mouth 2 (two) times daily.   tamsulosin (FLOMAX) 0.4 MG CAPS capsule Take 1 capsule (0.4 mg total) by mouth daily as needed (urine flow).   XARELTO 2.5 MG TABS tablet TAKE ONE TABLET BY MOUTH EVERY MORNING and TAKE ONE TABLET BY MOUTH EVERY EVENING   [DISCONTINUED] guaiFENesin (MUCINEX) 600 MG 12 hr tablet Take 600 mg by mouth 2 (two) times daily as needed (congestion/cough).   No facility-administered encounter medications on file as of 02/25/2022.    Past Medical History:  Diagnosis Date   Allergy    Basal cell carcinoma 09/03/2020   sup & nod- Left upper arm-posterior ([id number])   Bladder outlet obstruction    Cancer (HCC)    Skin   CHF (congestive heart failure) ([place])    Coronary artery disease    Hyperlipidemia    Hypertension    Melanoma ([place]) 09/03/2020   in situ- Left upper back (EXC)   Retinitis pigmentosa     Past Surgical History:  Procedure Laterality Date   ABDOMINAL AORTOGRAM W/LOWER EXTREMITY N/A 03/19/2020   Procedure: ABDOMINAL AORTOGRAM W/LOWER EXTREMITY;  Surgeon: Nigel Mormon, MD;  Location: [place]  [facility];  Service: Cardiovascular;  Laterality: N/A;   CARDIAC CATHETERIZATION     CATARACT EXTRACTION Bilateral 2004   CORONARY BALLOON ANGIOPLASTY N/A 11/14/2019   Procedure: CORONARY BALLOON ANGIOPLASTY;  Surgeon: Nigel Mormon, MD;  Location: [place] [facility];  Service: Cardiovascular;  Laterality: N/A;   CORONARY STENT INTERVENTION N/A 07/26/2019   Procedure: CORONARY STENT INTERVENTION;  Surgeon: Adrian Prows, MD;  Location: [place] [facility];  Service: Cardiovascular;  Laterality: N/A;   CORONARY/GRAFT ACUTE MI REVASCULARIZATION N/A  07/26/2019   Procedure: CORONARY/GRAFT ACUTE MI REVASCULARIZATION;  Surgeon: Adrian Prows, MD;  Location: [place] [facility];  Service: Cardiovascular;  Laterality: N/A;   EYE SURGERY     INTRAVASCULAR PRESSURE WIRE/FFR STUDY N/A 11/14/2019   Procedure: INTRAVASCULAR PRESSURE WIRE/FFR STUDY;  Surgeon: Nigel Mormon, MD;  Location: [place] [facility];  Service: Cardiovascular;  Laterality: N/A;   INTRAVASCULAR PRESSURE WIRE/FFR STUDY N/A 09/16/2021   Procedure: INTRAVASCULAR PRESSURE WIRE/FFR STUDY;  Surgeon: Nigel Mormon, MD;  Location: [place] [facility];  Service: Cardiovascular;  Laterality: N/A;   INTRAVASCULAR ULTRASOUND/IVUS N/A 11/14/2019   Procedure: Intravascular Ultrasound/IVUS;  Surgeon: Nigel Mormon, MD;  Location: [place] [facility];  Service: Cardiovascular;  Laterality: N/A;   LEFT HEART CATH AND CORONARY ANGIOGRAPHY N/A 07/26/2019   Procedure: LEFT HEART CATH AND CORONARY ANGIOGRAPHY;  Surgeon: Adrian Prows, MD;  Location: [place] [facility];  Service: Cardiovascular;  Laterality: N/A;   LOWER EXTREMITY ANGIOGRAPHY Left 05/21/2020   Procedure: LOWER EXTREMITY ANGIOGRAPHY;  Surgeon: Nigel Mormon, MD;  Location: [place] [facility];  Service: Cardiovascular;  Laterality: Left;   PERIPHERAL VASCULAR BALLOON ANGIOPLASTY Right 03/19/2020   Procedure: PERIPHERAL VASCULAR BALLOON ANGIOPLASTY;  Surgeon: Nigel Mormon, MD;  Location: [place] [facility];  Service: Cardiovascular;  Laterality: Right;  SFA   PERIPHERAL VASCULAR BALLOON ANGIOPLASTY  05/21/2020   Procedure: PERIPHERAL VASCULAR BALLOON ANGIOPLASTY;  Surgeon: Nigel Mormon, MD;  Location: [place] [facility];  Service: Cardiovascular;;  left SFA   PERIPHERAL VASCULAR INTERVENTION Bilateral 03/19/2020   Procedure: PERIPHERAL VASCULAR INTERVENTION;  Surgeon: Nigel Mormon, MD;  Location: [place] [facility];  Service: Cardiovascular;  Laterality: Bilateral;  External Iliac   RIGHT/LEFT HEART CATH  AND CORONARY ANGIOGRAPHY N/A 09/16/2021   Procedure: RIGHT/LEFT HEART CATH AND CORONARY ANGIOGRAPHY;  Surgeon: Nigel Mormon, MD;  Location: [place] [facility];  Service: Cardiovascular;  Laterality: N/A;   TUMOR REMOVAL  1961   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    Family History  Problem Relation Age of Onset   Heart disease Mother    Hyperlipidemia Mother    Asthma Mother    Heart disease Father    Hypertension Father    Heart disease Maternal Grandmother    Heart disease Maternal Grandfather    Heart disease Paternal Grandmother    Heart disease Paternal Grandfather     Social History   Socioeconomic History   Marital status: Single    Spouse name: Not on file   Number of children: 0   Years of education: 16   Highest education level: Bachelor's degree (e.g., BA, AB, BS)  Occupational History   Not on file  Tobacco Use   Smoking status: Former    Packs/day: 1.50    Years: 40.00    Total pack years: 60.00    Types: Cigarettes    Quit date: 07/26/2019    Years since quitting: 2.5   Smokeless  tobacco: Never  Vaping Use   Vaping Use: Never used  Substance and Sexual Activity   Alcohol use: Not Currently    Comment: occasional   Drug use: No   Sexual activity: Not Currently  Other Topics Concern   Not on file  Social History Narrative   Not on file   Social Determinants of Health   Financial Resource Strain: Low Risk  (02/20/2021)   Overall Financial Resource Strain (CARDIA)    Difficulty of Paying Living Expenses: Not hard at all  Food Insecurity: No Food Insecurity (02/20/2021)   Hunger Vital Sign    Worried About Running Out of Food in the Last Year: Never true    Ran Out of Food in the Last Year: Never true  Transportation Needs: No Transportation Needs (02/20/2021)   PRAPARE - Hydrologist (Medical): No    Lack of Transportation (Non-Medical): No  Physical Activity: Inactive (02/20/2021)   Exercise Vital Sign    Days  of Exercise per Week: 0 days    Minutes of Exercise per Session: 0 min  Stress: No Stress Concern Present (02/20/2021)   [place]    Feeling of Stress : Not at all  Social Connections: Socially Isolated (02/20/2021)   Social Connection and Isolation Panel [NHANES]    Frequency of Communication with Friends and Family: Twice a week    Frequency of Social Gatherings with Friends and Family: Twice a week    Attends Religious Services: Never    Marine scientist or Organizations: No    Attends Archivist Meetings: Never    Marital Status: Never married  Intimate Partner Violence: Not At Risk (02/20/2021)   Humiliation, Afraid, Rape, and Kick questionnaire    Fear of Current or Ex-Partner: No    Emotionally Abused: No    Physically Abused: No    Sexually Abused: No    Objective    BP 108/66 (BP Location: Left Arm, Patient Position: Sitting, Cuff Size: Large)   Pulse (!) 52   Temp 97.7 F (36.5 C) (Oral)   Ht '5\' 11"'$  (1.803 m)   Wt 297 lb 4.8 oz (134.9 kg)   SpO2 100%   BMI 41.46 kg/m   Physical Exam  61 year old male in no acute distress Cardiovascular exam with regular rate and rhythm Lungs clear to auscultation bilaterally  Assessment & Plan:   Problem List Items Addressed This Visit       Cardiovascular and Mediastinum   HFrEF (heart failure with reduced ejection fraction) ([place]) - Primary    Recommend continued close follow-up with cardiologist.  Continue with current medications as prescribed.      Other Visit Diagnoses     Wellness examination       Relevant Orders   CBC with Differential/Platelet   Comprehensive metabolic panel   Hemoglobin A1c   Lipid panel   TSH Rfx on Abnormal to Free T4   PSA Total (Reflex To Free)   Prostate cancer screening       Relevant Orders   PSA Total (Reflex To Free)       Return in about 6 weeks (around 04/08/2022) for CPE with FBW a few days  prior.   Seeley Hissong J [place], MD

## 2022-02-28 ENCOUNTER — Other Ambulatory Visit: Payer: Self-pay | Admitting: Cardiology

## 2022-02-28 DIAGNOSIS — I739 Peripheral vascular disease, unspecified: Secondary | ICD-10-CM

## 2022-02-28 DIAGNOSIS — I25118 Atherosclerotic heart disease of native coronary artery with other forms of angina pectoris: Secondary | ICD-10-CM

## 2022-03-04 ENCOUNTER — Other Ambulatory Visit: Payer: Self-pay | Admitting: Cardiology

## 2022-03-04 DIAGNOSIS — I25118 Atherosclerotic heart disease of native coronary artery with other forms of angina pectoris: Secondary | ICD-10-CM

## 2022-03-04 DIAGNOSIS — I739 Peripheral vascular disease, unspecified: Secondary | ICD-10-CM

## 2022-03-09 ENCOUNTER — Ambulatory Visit: Payer: Medicare HMO | Admitting: Cardiology

## 2022-03-09 ENCOUNTER — Other Ambulatory Visit: Payer: Self-pay

## 2022-03-09 DIAGNOSIS — I25118 Atherosclerotic heart disease of native coronary artery with other forms of angina pectoris: Secondary | ICD-10-CM

## 2022-03-09 DIAGNOSIS — I739 Peripheral vascular disease, unspecified: Secondary | ICD-10-CM

## 2022-03-09 MED ORDER — XARELTO 2.5 MG PO TABS: ORAL_TABLET | ORAL | 3 refills | Status: DC

## 2022-03-09 MED ORDER — ISOSORBIDE MONONITRATE ER 30 MG PO TB24: 15.0000 mg | ORAL_TABLET | Freq: Every day | ORAL | 3 refills | Status: DC

## 2022-03-11 ENCOUNTER — Ambulatory Visit: Payer: Medicare HMO | Admitting: Cardiology

## 2022-03-19 ENCOUNTER — Telehealth: Payer: Self-pay | Admitting: *Deleted

## 2022-03-19 NOTE — Patient Outreach (Signed)
  Care Coordination   03/19/2022 Name: Juanjose Mojica MRN: [id number] DOB: 1961-03-15   Care Coordination Outreach Attempts:  An unsuccessful telephone outreach was attempted today to offer the patient information about available care coordination services as a benefit of their health plan.   Follow Up Plan:  Additional outreach attempts will be made to offer the patient care coordination information and services.   Encounter Outcome:  No Answer   Care Coordination Interventions:  No, not indicated    Raina Mina, RN Care Management Coordinator [place] Office [phone]

## 2022-03-26 DIAGNOSIS — Z125 Encounter for screening for malignant neoplasm of prostate: Secondary | ICD-10-CM | POA: Diagnosis not present

## 2022-03-26 DIAGNOSIS — Z0000 Encounter for general adult medical examination without abnormal findings: Secondary | ICD-10-CM | POA: Diagnosis not present

## 2022-03-26 DIAGNOSIS — I5020 Unspecified systolic (congestive) heart failure: Secondary | ICD-10-CM | POA: Diagnosis not present

## 2022-03-27 LAB — COMPREHENSIVE METABOLIC PANEL
ALT: 19 IU/L (ref 0–44)
AST: 22 IU/L (ref 0–40)
Albumin/Globulin Ratio: 1.2 (ref 1.2–2.2)
Albumin: 3.9 g/dL (ref 3.9–4.9)
Alkaline Phosphatase: 148 IU/L — ABNORMAL HIGH (ref 44–121)
BUN/Creatinine Ratio: 12 (ref 10–24)
BUN: 11 mg/dL (ref 8–27)
Bilirubin Total: 1 mg/dL (ref 0.0–1.2)
CO2: 19 mmol/L — ABNORMAL LOW (ref 20–29)
Calcium: 8.9 mg/dL (ref 8.6–10.2)
Chloride: 101 mmol/L (ref 96–106)
Creatinine, Ser: 0.92 mg/dL (ref 0.76–1.27)
Globulin, Total: 3.2 g/dL (ref 1.5–4.5)
Glucose: 74 mg/dL (ref 70–99)
Potassium: 4.8 mmol/L (ref 3.5–5.2)
Sodium: 137 mmol/L (ref 134–144)
Total Protein: 7.1 g/dL (ref 6.0–8.5)
eGFR: 93 mL/min/{1.73_m2} (ref >59)

## 2022-03-27 LAB — CBC WITH DIFFERENTIAL/PLATELET
Basophils Absolute: 0.1 10*3/uL (ref 0.0–0.2)
Basos: 0 %
EOS (ABSOLUTE): 0.2 10*3/uL (ref 0.0–0.4)
Eos: 2 %
Hematocrit: 47.5 % (ref 37.5–51.0)
Hemoglobin: 15.8 g/dL (ref 13.0–17.7)
Immature Grans (Abs): 0 10*3/uL (ref 0.0–0.1)
Immature Granulocytes: 0 %
Lymphocytes Absolute: 2.4 10*3/uL (ref 0.7–3.1)
Lymphs: 21 %
MCH: 28.6 pg (ref 26.6–33.0)
MCHC: 33.3 g/dL (ref 31.5–35.7)
MCV: 86 fL (ref 79–97)
Monocytes Absolute: 0.7 10*3/uL (ref 0.1–0.9)
Monocytes: 7 %
Neutrophils Absolute: 7.9 10*3/uL — ABNORMAL HIGH (ref 1.4–7.0)
Neutrophils: 70 %
Platelets: 278 10*3/uL (ref 150–450)
RBC: 5.52 x10E6/uL (ref 4.14–5.80)
RDW: 12.7 % (ref 11.6–15.4)
WBC: 11.3 10*3/uL — ABNORMAL HIGH (ref 3.4–10.8)

## 2022-03-27 LAB — LIPID PANEL
Chol/HDL Ratio: 3.3 ratio (ref 0.0–5.0)
Cholesterol, Total: 107 mg/dL (ref 100–199)
HDL: 32 mg/dL — ABNORMAL LOW (ref >39)
LDL Chol Calc (NIH): 58 mg/dL (ref 0–99)
Triglycerides: 88 mg/dL (ref 0–149)
VLDL Cholesterol Cal: 17 mg/dL (ref 5–40)

## 2022-03-27 LAB — BASIC METABOLIC PANEL
BUN/Creatinine Ratio: 12 (ref 10–24)
BUN: 11 mg/dL (ref 8–27)
CO2: 20 mmol/L (ref 20–29)
Calcium: 9 mg/dL (ref 8.6–10.2)
Chloride: 102 mmol/L (ref 96–106)
Creatinine, Ser: 0.91 mg/dL (ref 0.76–1.27)
Glucose: 72 mg/dL (ref 70–99)
Potassium: 4.8 mmol/L (ref 3.5–5.2)
Sodium: 137 mmol/L (ref 134–144)
eGFR: 95 mL/min/{1.73_m2} (ref >59)

## 2022-03-27 LAB — PRO B NATRIURETIC PEPTIDE: NT-Pro BNP: 1279 pg/mL — ABNORMAL HIGH (ref 0–210)

## 2022-03-27 LAB — HEMOGLOBIN A1C
Est. average glucose Bld gHb Est-mCnc: 111 mg/dL
Hgb A1c MFr Bld: 5.5 % (ref 4.8–5.6)

## 2022-03-27 LAB — PSA TOTAL (REFLEX TO FREE): Prostate Specific Ag, Serum: 1.8 ng/mL (ref 0.0–4.0)

## 2022-03-27 LAB — TSH RFX ON ABNORMAL TO FREE T4: TSH: 1.9 u[IU]/mL (ref 0.450–4.500)

## 2022-03-28 DIAGNOSIS — I5020 Unspecified systolic (congestive) heart failure: Secondary | ICD-10-CM | POA: Diagnosis not present

## 2022-04-02 ENCOUNTER — Ambulatory Visit: Payer: Medicare HMO | Admitting: Cardiology

## 2022-04-02 ENCOUNTER — Telehealth: Payer: Self-pay

## 2022-04-02 ENCOUNTER — Encounter: Payer: Self-pay | Admitting: Cardiology

## 2022-04-02 VITALS — BP 138/75 | HR 66 | Resp 16 | Ht 71.0 in | Wt 293.0 lb

## 2022-04-02 DIAGNOSIS — I5020 Unspecified systolic (congestive) heart failure: Secondary | ICD-10-CM

## 2022-04-02 DIAGNOSIS — I739 Peripheral vascular disease, unspecified: Secondary | ICD-10-CM

## 2022-04-02 DIAGNOSIS — I25118 Atherosclerotic heart disease of native coronary artery with other forms of angina pectoris: Secondary | ICD-10-CM | POA: Diagnosis not present

## 2022-04-02 MED ORDER — SPIRONOLACTONE 25 MG PO TABS: 12.5000 mg | ORAL_TABLET | Freq: Every day | ORAL | 3 refills | Status: DC

## 2022-04-02 NOTE — Progress Notes (Signed)
Patient referred by Maximiano Coss, NP for CAD  Subjective:   Jonathon Cain, male    DOB: [birth year], 62 y.o.   MRN: [id number]   Chief Complaint  Patient presents with   HFrEF   Follow-up    2 month     HPI   62 y.o. 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  Patient's home BP is well controlled on current regimen. BP less soft with Entresto dose decrease.   Average Systolic BP Level 350.0 mmHg Lowest Systolic BP Level 98 mmHg Highest Systolic BP Level 938 mmHg   Average Diastolic BP Level 18.29 mmHg Lowest Diastolic BP Level 59 mmHg Highest Diastolic BP Level 94 mmHg   04/01/2022 Wednesday at 01:06 PM 101 / 59                04/01/2022 Wednesday at 01:05 PM 99 / 63      03/31/2022 Tuesday at 12:36 PM      110 / 70                03/30/2022 Monday at 11:23 AM       118 / 82                03/29/2022 'Sunday at 04:48 PM        115 / 74                03/28/2022 Saturday at 02:51 PM      118 / 81                03/27/2022 Friday at 09:31 AM          123 / 80                03/25/2022 Wednesday at 12:59 PM 115 / 61                03/24/2022 Tuesday at 09:00 AM      125 / 78                03/23/2022 Monday at 11:46 AM       132 / 92                03/22/2022 Sunday at 09:47 AM        108 / 75                03/21/2022 Saturday at 01:13 PM      122 / 86                03/21/2022 Saturday at 01:12 PM      12'$ 7 / 94  Reviewed recent test results with the patient, details below. He has noticed occasional leg swelling. He denies overt dyspnea symptoms. He has reportedly had diarrhea for last 3 years ever since he has been on Jardiance (part of treatment as well as part of clinical trial. He would like to come off Jardiance.    Current Outpatient Medications:     aspirin EC 81 MG tablet, Take 1 tablet (81 mg total) by mouth daily. Swallow whole., Disp: 90 tablet, Rfl: 3   atorvastatin (LIPITOR) 80 MG tablet, TAKE ONE TABLET BY MOUTH EVERYDAY AT BEDTIME, Disp: 103 tablet, Rfl: 3   cetirizine (ZYRTEC) 10 MG tablet, Take 10 mg by mouth daily  as needed for allergies., Disp: , Rfl:    empagliflozin (JARDIANCE) 10 MG TABS tablet, Take 1 tablet (10 mg total) by mouth as directed. Take it every other day Take it on the morning that you take metoprolol at night, Disp: 60 tablet, Rfl: 3   fluticasone (FLONASE) 50 MCG/ACT nasal spray, Place 2 sprays into both nostrils daily as needed for allergies or rhinitis. , Disp: , Rfl:    furosemide (LASIX) 40 MG tablet, TAKE ONE TABLET BY MOUTH AS NEEDED. take ONLY IF you have more THAN usual LEG swelling, Disp: 30 tablet, Rfl: 2   isosorbide mononitrate (IMDUR) 30 MG 24 hr tablet, Take 0.5 tablets (15 mg total) by mouth daily., Disp: 30 tablet, Rfl: 3   metoprolol succinate (TOPROL-XL) 25 MG 24 hr tablet, TAKE 1/2 TABLET BY MOUTH Every Other Day at Bedtime, Disp: 45 tablet, Rfl: 2   rivaroxaban (XARELTO) 2.5 MG TABS tablet, TAKE ONE TABLET BY MOUTH EVERY MORNING and TAKE ONE TABLET BY MOUTH EVERY EVENING, Disp: 180 tablet, Rfl: 3   sacubitril-valsartan (ENTRESTO) 24-26 MG, Take 1 tablet by mouth 2 (two) times daily., Disp: 60 tablet, Rfl: 5   tamsulosin (FLOMAX) 0.4 MG CAPS capsule, Take 1 capsule (0.4 mg total) by mouth daily as needed (urine flow)., Disp: 90 capsule, Rfl: 1    Cardiovascular and other pertinent studies:  EKG 04/02/2022: Sinus rhythm 65 bpm First degree A-V block Frequent pvcs -ventricular trigeminy  Left axis -anterior fascicular block Nonspecific T-abnormality  RHC/LHC 09/16/2021: LM: Normal LAD: Patent prox LAD stent with 40% late lumen loss         RFR 0.94 (physiologically non-significant) Lcx: OM1 ostial 50% stenosis (Improved from previous cath in 2021-80% with negative RFR then) RCA: Distal  20%, ostial PDA 50% stenoses   LV: 121/11 mmHg LVEDP: 23 mmHg   RA: 10 mmHg RV: 78/4 mmHg PA: 72/23 mmHg, mPAP 40 mmHg PCW: 24 mmHg   CO: 6.0 L/min CI: 2.4 L/min/m2   No indication for revascularization. Continue optimization of GDMT for HFrEF  Echocardiogram 08/21/2021:  Left ventricle cavity is normal in size and wall thickness. Severe global  hypokinesis with akinetic apex. LVEF 25-30%. Doppler evidence of grade III  (restrictive) diastolic dysfunction, elevated LAP.  Mild to moderate mitral regurgitation.  Normal right atrial pressure.  Compared to previous study on 09/18/2020, LVEF has further reduced from  around 35%.   ABI 08/21/2021:  This exam reveals normal perfusion of the right lower extremity (ABI  1.08).    This exam reveals normal perfusion of the left lower extremity (ABI 1.01).   Compared to the study done on 04/17/2020, left ABI has improved from 0.75.    No change in the right.  This represents successful revascularization of  the bilateral SFA by history.  Markedly abnormal monophasic waveform pattern in bilateral posterior  tibial artery.  Lexiscan Tetrofosmin stress test 09/18/2020: Lexiscan nuclear stress test performed using 1-day protocol. Patient experienced dyspnea and chest discomfort SPECT images showed large sized, moderate intensity, fixed perfusion defect in mid to apical, anterior, inferior, and apical myocardium. There is severe decrease in myocardial thickening and wall motion. Stress LVEF 12%. High risk study.  Cardiac MRI 02/01/2020: 1. Subendocardial late gadolinium enhancement consistent with prior infarct in LAD territory. There is >50% transmural LGE suggesting nonviability in mid anterior/anteroseptal walls, apical anterior/septal/inferior walls, and apex. 2. Mild LV dilatation with moderate systolic function (EF 70%). Akinesis of mid to apical anterior, mid anteroseptal, apical septal, apical inferior,  and apex. 3.  Normal RV size  and systolic function (EF 12%) 4.  Moderate mitral regurgitation (regurgitant fraction 24%) 5.  Moderate right pleural effusion   Coronary angiography 11/14/2019: LM: Diffuse 30% disease. IVUS MLA 7.3 mm2,. dFR 1.0 LAD: Patent prox LAD stent with udner expansion        Optimization with 3.5X15 mm [place] balloon at 22 atm        Excellent expansion as seen on IVUS        TIMI flow II-->III        Diag: Mod diffuse disease LCx: 80% ostial OM1 stenosis. dFR 0.98 RCA: Not engaged today  Left Heart Catheterization 07/26/2019 (Dr. Einar Gip):  LM: Mild diffuse disease LAD: Very large caliber vessel, occluded at the proximal end.  Gives origin to high D1 which is large.  Successful PTCA and stenting of the proximal LAD overlapping D1, 3.5 x 24 mm Synergy DES deployed at 12 atmospheric pressure for 60 S. 100% to 0% with TIMI 0 to TIMI-3 flow. LCx: Large vessel, giving origin to a very large OM1 which is secondary branches.  Ostium of OM1 has 80% stenosis. RCA: Mild diffuse disease, distal RCA 15 to 20% stenosis, PDA 50% stenosis.   Recommendation: Patient will need aggressive risk factor modification, dual antiplatelet therapy with aspirin and Brilinta for a year, will be watched carefully in the intensive care unit for preshock, suspect his EDP is extremely high at the end of the procedure with excessive contrast use.  Will monitor for any contrast nephropathy as well.  Recent labs: 03/26/2022: Glucose 72, BUN/Cr 11/0.91. EGFR 95. Na/K 137/4.8. AlKP 148. Rest of the CMP normal H/H 15/47. MCV 86. Platelets 278 HbA1C 5.5% Chol 107, TG 88, HDL 32, LDL 58 TSH 1.9 normal  Latest Reference Range & Units 12/02/21 11:15 03/26/22 11:19  NT-Pro BNP 0 - 210 pg/mL 709 (H) 1,279 (H)  (H): Data is abnormally high  09/03/2021: Glucose 101, BUN/Cr 12/1.05. EGFR 80. Na/K 138/4.5. Chol 97, TG 105, HDL 32, LDL 45 NT-proBNP 502  10/29/2020: Glucose 86, BUN/Cr 12/1.08. EGFR 78. Na/K 141/4.2.  NT pro BNP  733  07/26/2019: Chol 230, TG 95, HDL 36, LDL 175   Review of Systems  Cardiovascular:  Negative for chest pain, dyspnea on exertion, leg swelling, palpitations and syncope.  Gastrointestinal:  Positive for diarrhea.         Vitals:   04/02/22 0915  BP: 138/75  Pulse: 66  Resp: 16  SpO2: 95%      Body mass index is 40.87 kg/m. Filed Weights   04/02/22 0915  Weight: 293 lb (132.9 kg)       Objective:   Physical Exam Vitals and nursing note reviewed.  Constitutional:      General: He is not in acute distress. Neck:     Vascular: No JVD.  Cardiovascular:     Rate and Rhythm: Normal rate and regular rhythm.     Pulses:          Dorsalis pedis pulses are 1+ on the left side.       Posterior tibial pulses are 0 on the right side and 1+ on the left side.     Heart sounds: Normal heart sounds. No murmur heard. Pulmonary:     Effort: Pulmonary effort is normal.     Breath sounds: Normal breath sounds. No wheezing or rales.  Musculoskeletal:     Right lower leg: No edema.     Left lower leg:  No edema.    VISIT DIAGNOSES:      ICD-10-CM   1. HFrEF (heart failure with reduced ejection fraction) (HCC)  I50.20 EKG 12-Lead       Assessment & Recommendations:   62 y.o. 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 ProBNP 1200 (03/2022). Now that his blood pressures are higher, added spironolactone 12.5 mg daily. Patient would like to come off Jardiance due to side effects of diarrhea. Continue Entresto to 24-26 mg bid. Continue metoprolol succinate 12.5 mg every other day.  PAD: Successful intervention to b/l CIA and Rt SFA for  severe caludication (02/2020) Successful intervention to Lt SFA ABI 0.8 b/l. Residual mild below the knee disease Continue Aspirin 81 mg daily, Xarelto 2.5 mg bid  CAD: Nonobstructive CAD, patent LAD stent (08/2021) Improved anginal symptoms on Imdur 15 mg daily.   Continue Plavix 75 mg daily, Xarelto 2.5 mg bid Continue Lipitor 80. LDL down to 64.  BMP, proBNP in 2 weeks F/u in 4 weeks    Nigel Mormon, MD Pager: [phone] Office: [phone]

## 2022-04-02 NOTE — Telephone Encounter (Signed)
Patient's home BP is well controlled on current regimen. BP less soft with Entresto dose decrease.  Average Systolic BP Level 215.8 mmHg Lowest Systolic BP Level 98 mmHg Highest Systolic BP Level 727 mmHg  Average Diastolic BP Level 61.84 mmHg Lowest Diastolic BP Level 59 mmHg Highest Diastolic BP Level 94 mmHg  04/01/2022 Wednesday at 01:06 PM 101 / 59      04/01/2022 Wednesday at 01:05 PM 99 / 63      03/31/2022 Tuesday at 12:36 PM 110 / 70      03/30/2022 Monday at 11:23 AM 118 / 82      03/29/2022 'Sunday at 04:48 PM 115 / 74      03/28/2022 Saturday at 02:51 PM 118 / 81      03/27/2022 Friday at 09:31 AM 123 / 80      03/25/2022 Wednesday at 12:59 PM 115 / 61      03/24/2022 Tuesday at 09:00 AM 125 / 78      03/23/2022 Monday at 11:46 AM 132 / 92      03/22/2022 Sunday at 09:47 AM 108 / 75      03/21/2022 Saturday at 01:13 PM 122 / 86      12'$ /30/2023 Saturday at 01:12 PM 127 / 94

## 2022-04-06 ENCOUNTER — Ambulatory Visit (HOSPITAL_BASED_OUTPATIENT_CLINIC_OR_DEPARTMENT_OTHER): Payer: Medicare HMO

## 2022-04-06 DIAGNOSIS — Z0000 Encounter for general adult medical examination without abnormal findings: Secondary | ICD-10-CM

## 2022-04-06 DIAGNOSIS — Z125 Encounter for screening for malignant neoplasm of prostate: Secondary | ICD-10-CM

## 2022-04-13 ENCOUNTER — Encounter (HOSPITAL_BASED_OUTPATIENT_CLINIC_OR_DEPARTMENT_OTHER): Payer: Self-pay | Admitting: Family Medicine

## 2022-04-13 ENCOUNTER — Ambulatory Visit (INDEPENDENT_AMBULATORY_CARE_PROVIDER_SITE_OTHER): Payer: Medicare HMO | Admitting: Family Medicine

## 2022-04-13 VITALS — BP 101/52 | HR 52 | Temp 97.7°F | Ht 71.0 in | Wt 288.0 lb

## 2022-04-13 DIAGNOSIS — Z85828 Personal history of other malignant neoplasm of skin: Secondary | ICD-10-CM | POA: Insufficient documentation

## 2022-04-13 DIAGNOSIS — F320 Major depressive disorder, single episode, mild: Secondary | ICD-10-CM | POA: Diagnosis not present

## 2022-04-13 DIAGNOSIS — Z0000 Encounter for general adult medical examination without abnormal findings: Secondary | ICD-10-CM

## 2022-04-13 DIAGNOSIS — Z23 Encounter for immunization: Secondary | ICD-10-CM | POA: Diagnosis not present

## 2022-04-13 NOTE — Progress Notes (Signed)
Complete physical exam  Patient: Jonathon Cain   DOB: [birth year]   62 y.o. Male  MRN: [id number]  Subjective:    Chief Complaint  Patient presents with   Annual Exam    Pt here for annual exam     Yavuz Kirby is a 62 y.o. male who presents today for a complete physical exam. He reports consuming a low salt  diet.  Does not participate in exercise due to vision problems  He generally feels fairly well. He reports sleeping fairly well. He does not have additional problems to discuss today.   Sees cardiology for HF, they are working on getting medications under control. No medication refills per PCP.   PHQ-9: 9, denies suicidal ideation, denies counseling.    Most recent fall risk assessment:    02/25/2022    1:37 PM  [place] in the past year? 0  Number falls in past yr: 0  Injury with Fall? 0  Risk for fall due to : No Fall Risks  Follow up Falls evaluation completed     Most recent depression screenings:    04/13/2022    1:49 PM 02/25/2022    1:37 PM  PHQ 2/9 Scores  PHQ - 2 Score 2 0  PHQ- 9 Score 9 0  Exception Documentation Medical reason Medical reason        Patient Care Team: [place], [name], MD as PCP - General (Family Medicine) Nigel Mormon, MD as Consulting Physician (Cardiology) Peter Congo, MD as Referring Physician (Ophthalmology) Lavonna Monarch, MD (Inactive) as Consulting Physician (Dermatology) Edythe Clarity, [facility] (Pharmacist)   Outpatient Medications Prior to Visit  Medication Sig   aspirin EC 81 MG tablet Take 1 tablet (81 mg total) by mouth daily. Swallow whole.   atorvastatin (LIPITOR) 80 MG tablet TAKE ONE TABLET BY MOUTH EVERYDAY AT BEDTIME   cetirizine (ZYRTEC) 10 MG tablet Take 10 mg by mouth daily as needed for allergies.   fluticasone (FLONASE) 50 MCG/ACT nasal spray Place 2 sprays into both nostrils daily as needed for allergies or rhinitis.    furosemide (LASIX) 40 MG tablet TAKE ONE  TABLET BY MOUTH AS NEEDED. take ONLY IF you have more THAN usual LEG swelling   isosorbide mononitrate (IMDUR) 30 MG 24 hr tablet Take 0.5 tablets (15 mg total) by mouth daily.   metoprolol succinate (TOPROL-XL) 25 MG 24 hr tablet TAKE 1/2 TABLET BY MOUTH Every Other Day at Bedtime   rivaroxaban (XARELTO) 2.5 MG TABS tablet TAKE ONE TABLET BY MOUTH EVERY MORNING and TAKE ONE TABLET BY MOUTH EVERY EVENING   sacubitril-valsartan (ENTRESTO) 24-26 MG Take 1 tablet by mouth 2 (two) times daily.   spironolactone (ALDACTONE) 25 MG tablet Take 0.5 tablets (12.5 mg total) by mouth daily.   tamsulosin (FLOMAX) 0.4 MG CAPS capsule Take 1 capsule (0.4 mg total) by mouth daily as needed (urine flow).   No facility-administered medications prior to visit.    Review of Systems  Constitutional:  Positive for chills (on and off, attributes to having constiptation.). Negative for fever.  Respiratory:  Positive for shortness of breath (not new, has HF).   Cardiovascular:  Negative for chest pain (sees cards for HF, last saw 2 weeks ago, sees cards every 1-2 months.) and leg swelling.  Gastrointestinal:  Negative for abdominal pain, diarrhea (not every day, up to 7-8 per day. takes immodium), nausea and vomiting.  Psychiatric/Behavioral:  Positive for depression. Negative for suicidal  ideas.           Objective:     BP (!) 101/52 (BP Location: Left Arm, Patient Position: Sitting, Cuff Size: Large)   Pulse (!) 52   Temp 97.7 F (36.5 C) (Oral)   Ht '5\' 11"'$  (1.803 m)   Wt 288 lb (130.6 kg)   SpO2 99%   BMI 40.17 kg/m    Physical Exam Vitals and nursing note reviewed.  Constitutional:      General: He is not in acute distress.    Appearance: Normal appearance. He is obese.  Cardiovascular:     Rate and Rhythm: Regular rhythm.     Heart sounds: Normal heart sounds.  Pulmonary:     Effort: Pulmonary effort is normal.     Breath sounds: Normal breath sounds.  Musculoskeletal:     Right lower  leg: No edema.     Left lower leg: No edema.  Skin:    Capillary Refill: Capillary refill takes less than 2 seconds.  Neurological:     General: No focal deficit present.     Mental Status: He is alert. Mental status is at baseline.  Psychiatric:        Mood and Affect: Mood normal.        Behavior: Behavior normal.      No results found for any visits on 04/13/22.     Assessment & Plan:    Routine Health Maintenance and Physical Exam  Immunization History  Administered Date(s) Administered   Influenza,inj,Quad PF,6+ Mos 12/03/2020, 04/13/2022   PFIZER(Purple Top)SARS-COV-2 Vaccination 12/14/2019, 01/04/2020   PNEUMOCOCCAL CONJUGATE-20 12/03/2020    Health Maintenance  Topic Date Due   HIV Screening  Never done   Hepatitis C Screening  Never done   COVID-19 Vaccine (3 - Pfizer risk series) 02/01/2020   Medicare Annual Wellness (AWV)  02/20/2022   Zoster Vaccines- Shingrix (1 of 2) 07/27/2022 (Originally 09/14/1977)   Lung Cancer Screening  01/03/2023   COLONOSCOPY (Pts 45-58yr Insurance coverage will need to be confirmed)  06/28/2028   INFLUENZA VACCINE  Completed   HPV VACCINES  Aged Out   DTaP/Tdap/Td  Discontinued    Discussed health benefits of physical activity, and encouraged him to engage in regular exercise appropriate for his age and condition.  Problem List Items Addressed This Visit     Encounter for administration of COVID-19 vaccine - Primary   Depression, major, single episode, mild (HOmega   History of skin cancer   Relevant Orders   Ambulatory referral to Dermatology   Need for influenza vaccination   Relevant Orders   Flu Vaccine QUAD 6+ mos PF IM (Fluarix Quad PF) (Completed)  No refills needed today, sees specialist for medication management. Labs per cards on 03/26/22.  Will get flu shot today, wants to go to outside pharmacy for updated Covid vaccine. Last EKG per cards on 04/02/22.  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    Routine labs reviewed. HCM reviewed/discussed. Anticipatory guidance regarding healthy weight, lifestyle and choices given. Recommend healthy diet.  Recommend approximately 150 minutes/week of moderate intensity exercise. This is a struggle for patient due to HF and vision.  Limit alcohol consumption: no more than one drink per day for women and 2 drinks per day for me. Recommend regular dental and vision exams. Always use seatbelt/lap and shoulder restraints. Recommend using smoke alarms and checking batteries at least twice a year. Recommend using sunscreen when outside. Discussed colon cancer screening  recommendations. Received seasonal influenza vaccine today.     Return in about 6 months (around 10/12/2022) for for general health.     Chalmers Guest, FNP

## 2022-04-28 DIAGNOSIS — H30123 Disseminated chorioretinal inflammation, peripheral, bilateral: Secondary | ICD-10-CM | POA: Diagnosis not present

## 2022-04-28 DIAGNOSIS — H5362 Acquired night blindness: Secondary | ICD-10-CM | POA: Diagnosis not present

## 2022-04-28 DIAGNOSIS — R6889 Other general symptoms and signs: Secondary | ICD-10-CM | POA: Diagnosis not present

## 2022-04-28 DIAGNOSIS — H3552 Pigmentary retinal dystrophy: Secondary | ICD-10-CM | POA: Diagnosis not present

## 2022-04-28 DIAGNOSIS — I5020 Unspecified systolic (congestive) heart failure: Secondary | ICD-10-CM | POA: Diagnosis not present

## 2022-04-28 DIAGNOSIS — H53453 Other localized visual field defect, bilateral: Secondary | ICD-10-CM | POA: Diagnosis not present

## 2022-04-28 DIAGNOSIS — Z1589 Genetic susceptibility to other disease: Secondary | ICD-10-CM | POA: Diagnosis not present

## 2022-04-30 ENCOUNTER — Telehealth: Payer: Self-pay

## 2022-04-30 ENCOUNTER — Encounter: Payer: Self-pay | Admitting: Cardiology

## 2022-04-30 ENCOUNTER — Ambulatory Visit: Payer: Medicare HMO | Admitting: Cardiology

## 2022-04-30 VITALS — BP 101/59 | HR 51 | Resp 16 | Ht 71.0 in | Wt 295.0 lb

## 2022-04-30 DIAGNOSIS — I5020 Unspecified systolic (congestive) heart failure: Secondary | ICD-10-CM | POA: Diagnosis not present

## 2022-04-30 DIAGNOSIS — I25118 Atherosclerotic heart disease of native coronary artery with other forms of angina pectoris: Secondary | ICD-10-CM

## 2022-04-30 DIAGNOSIS — I739 Peripheral vascular disease, unspecified: Secondary | ICD-10-CM

## 2022-04-30 MED ORDER — FUROSEMIDE 40 MG PO TABS: 40.0000 mg | ORAL_TABLET | Freq: Every day | ORAL | 0 refills | Status: DC

## 2022-04-30 NOTE — Progress Notes (Signed)
Patient referred by [place], [name], MD for CAD  Subjective:   Jonathon Cain, male    DOB: Apr 13, 1960, 62 y.o.   MRN: [id number]   Chief Complaint  Patient presents with   HFrEF   Follow-up    4 week     HPI   62 y.o. 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  Patient home BP is soft on current antihypertensive regimen.    Systolic Blood Pressure          103.9 (30.1 - 601.0) Diastolic Blood Pressure        69.1 (56.0 - 84.0) Heart Rate       50.3 (45.0 - 58.0)   04/29/22 12:18 PM         Blip 800           105      /           56        mmHg  56        bpm      04/28/22 8:03 AM           Blip 800           109      /           84        mmHg  46        bpm      04/27/22 10:26 AM         Blip 800           112      /           69        mmHg  47        bpm      04/26/22 8:36 AM           Blip 800           105      /           66        mmHg  58        bpm      04/25/22 11:38 AM         Blip 800           93        /           62        mmHg  46        bpm      04/24/22 10:44 AM         Blip 800           110      /           76        mmHg  54        bpm      04/24/22 10:37 AM         Blip 800           93        /           71        mmHg  45        bpm  It appears that patient was taking spironolactone 25 mg daily, instead of 12.5 mg daily.  He is currently taking Lasix 40 mg every other day.  For last few days, he has felt lightheaded, as well as short of breath with usual activities.  His diarrhea stopped after stopping Jardiance.   Current Outpatient Medications:    aspirin EC 81 MG tablet, Take 1 tablet (81 mg total) by mouth daily. Swallow whole., Disp: 90 tablet, Rfl: 3   atorvastatin (LIPITOR) 80 MG tablet, TAKE ONE TABLET BY MOUTH EVERYDAY AT BEDTIME, Disp: 103  tablet, Rfl: 3   cetirizine (ZYRTEC) 10 MG tablet, Take 10 mg by mouth daily as needed for allergies., Disp: , Rfl:    fluticasone (FLONASE) 50 MCG/ACT nasal spray, Place 2 sprays into both nostrils daily as needed for allergies or rhinitis. , Disp: , Rfl:    furosemide (LASIX) 40 MG tablet, TAKE ONE TABLET BY MOUTH AS NEEDED. take ONLY IF you have more THAN usual LEG swelling, Disp: 30 tablet, Rfl: 2   isosorbide mononitrate (IMDUR) 30 MG 24 hr tablet, Take 0.5 tablets (15 mg total) by mouth daily., Disp: 30 tablet, Rfl: 3   metoprolol succinate (TOPROL-XL) 25 MG 24 hr tablet, TAKE 1/2 TABLET BY MOUTH Every Other Day at Bedtime, Disp: 45 tablet, Rfl: 2   rivaroxaban (XARELTO) 2.5 MG TABS tablet, TAKE ONE TABLET BY MOUTH EVERY MORNING and TAKE ONE TABLET BY MOUTH EVERY EVENING, Disp: 180 tablet, Rfl: 3   sacubitril-valsartan (ENTRESTO) 24-26 MG, Take 1 tablet by mouth 2 (two) times daily., Disp: 60 tablet, Rfl: 5   spironolactone (ALDACTONE) 25 MG tablet, Take 0.5 tablets (12.5 mg total) by mouth daily., Disp: 30 tablet, Rfl: 3   tamsulosin (FLOMAX) 0.4 MG CAPS capsule, Take 1 capsule (0.4 mg total) by mouth daily as needed (urine flow)., Disp: 90 capsule, Rfl: 1    Cardiovascular and other pertinent studies:  EKG 04/02/2022: Sinus rhythm 65 bpm First degree A-V block Frequent pvcs -ventricular trigeminy  Left axis -anterior fascicular block Nonspecific T-abnormality  RHC/LHC 09/16/2021: LM: Normal LAD: Patent prox LAD stent with 40% late lumen loss         RFR 0.94 (physiologically non-significant) Lcx: OM1 ostial 50% stenosis (Improved from previous cath in 2021-80% with negative RFR then) RCA: Distal 20%, ostial PDA 50% stenoses   LV: 121/11 mmHg LVEDP: 23 mmHg   RA: 10 mmHg RV: 78/4 mmHg PA: 72/23 mmHg, mPAP 40 mmHg PCW: 24 mmHg   CO: 6.0 L/min CI: 2.4 L/min/m2   No indication for revascularization. Continue optimization of GDMT for HFrEF  Echocardiogram 08/21/2021:  Left  ventricle cavity is normal in size and wall thickness. Severe global  hypokinesis with akinetic apex. LVEF 25-30%. Doppler evidence of grade III  (restrictive) diastolic dysfunction, elevated LAP.  Mild to moderate mitral regurgitation.  Normal right atrial pressure.  Compared to previous study on 09/18/2020, LVEF has further reduced from  around 35%.   ABI 08/21/2021:  This exam reveals normal perfusion of the right lower extremity (ABI  1.08).    This exam reveals normal perfusion of the left lower extremity (ABI 1.01).   Compared to the study done on 04/17/2020, left ABI has improved from 0.75.    No change in the right.  This represents successful revascularization of  the bilateral SFA by history.  Markedly abnormal monophasic waveform pattern in bilateral posterior  tibial artery.  Lexiscan Tetrofosmin stress test 09/18/2020: Lexiscan nuclear stress test performed using 1-day  protocol. Patient experienced dyspnea and chest discomfort SPECT images showed large sized, moderate intensity, fixed perfusion defect in mid to apical, anterior, inferior, and apical myocardium. There is severe decrease in myocardial thickening and wall motion. Stress LVEF 12%. High risk study.  Cardiac MRI 02/01/2020: 1. Subendocardial late gadolinium enhancement consistent with prior infarct in LAD territory. There is >50% transmural LGE suggesting nonviability in mid anterior/anteroseptal walls, apical anterior/septal/inferior walls, and apex. 2. Mild LV dilatation with moderate systolic function (EF 70%). Akinesis of mid to apical anterior, mid anteroseptal, apical septal, apical inferior, and apex. 3.  Normal RV size and systolic function (EF 35%) 4.  Moderate mitral regurgitation (regurgitant fraction 24%) 5.  Moderate right pleural effusion   Coronary angiography 11/14/2019: LM: Diffuse 30% disease. IVUS MLA 7.3 mm2,. dFR 1.0 LAD: Patent prox LAD stent with udner expansion        Optimization with  3.5X15 mm [place] balloon at 22 atm        Excellent expansion as seen on IVUS        TIMI flow II-->III        Diag: Mod diffuse disease LCx: 80% ostial OM1 stenosis. dFR 0.98 RCA: Not engaged today  Left Heart Catheterization 07/26/2019 (Dr. Einar Gip):  LM: Mild diffuse disease LAD: Very large caliber vessel, occluded at the proximal end.  Gives origin to high D1 which is large.  Successful PTCA and stenting of the proximal LAD overlapping D1, 3.5 x 24 mm Synergy DES deployed at 12 atmospheric pressure for 60 S. 100% to 0% with TIMI 0 to TIMI-3 flow. LCx: Large vessel, giving origin to a very large OM1 which is secondary branches.  Ostium of OM1 has 80% stenosis. RCA: Mild diffuse disease, distal RCA 15 to 20% stenosis, PDA 50% stenosis.   Recommendation: Patient will need aggressive risk factor modification, dual antiplatelet therapy with aspirin and Brilinta for a year, will be watched carefully in the intensive care unit for preshock, suspect his EDP is extremely high at the end of the procedure with excessive contrast use.  Will monitor for any contrast nephropathy as well.  Recent labs: 03/26/2022: Glucose 72, BUN/Cr 11/0.91. EGFR 95. Na/K 137/4.8. AlKP 148. Rest of the CMP normal H/H 15/47. MCV 86. Platelets 278 HbA1C 5.5% Chol 107, TG 88, HDL 32, LDL 58 TSH 1.9 normal  Latest Reference Range & Units 12/02/21 11:15 03/26/22 11:19  NT-Pro BNP 0 - 210 pg/mL 709 (H) 1,279 (H)  (H): Data is abnormally high  09/03/2021: Glucose 101, BUN/Cr 12/1.05. EGFR 80. Na/K 138/4.5. Chol 97, TG 105, HDL 32, LDL 45 NT-proBNP 502  10/29/2020: Glucose 86, BUN/Cr 12/1.08. EGFR 78. Na/K 141/4.2.  NT pro BNP 733  07/26/2019: Chol 230, TG 95, HDL 36, LDL 175   Review of Systems  Cardiovascular:  Positive for dyspnea on exertion. Negative for chest pain, leg swelling, palpitations and syncope.  Gastrointestinal:  Negative for diarrhea.  Neurological:  Positive for light-headedness.         Vitals:    04/30/22 1043  BP: (!) 101/59  Pulse: (!) 51  Resp: 16  SpO2: 95%      Body mass index is 41.14 kg/m. Filed Weights   04/30/22 1043  Weight: 295 lb (133.8 kg)       Objective:   Physical Exam Vitals and nursing note reviewed.  Constitutional:      General: He is not in acute distress. Neck:     Vascular: No JVD.  Cardiovascular:  Rate and Rhythm: Normal rate and regular rhythm.     Pulses:          Dorsalis pedis pulses are 1+ on the left side.       Posterior tibial pulses are 0 on the right side and 1+ on the left side.     Heart sounds: Normal heart sounds. No murmur heard. Pulmonary:     Effort: Pulmonary effort is normal.     Breath sounds: Normal breath sounds. No wheezing or rales.  Musculoskeletal:     Right lower leg: No edema.     Left lower leg: No edema.    VISIT DIAGNOSES:    No diagnosis found.    Assessment & Recommendations:   62 y.o. 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 When he looks euvolemic on exam, he is more short of breath than usual. Given his lightheadedness, I have asked him to do spironolactone from 25 mg daily to 12.5 mg daily, and is to take Lasix 40 mg daily with regular hydration. ProBNP 1200 (03/2022). Repeat BMP and proBNP today. Further recommendations after above testing.   Continue Entresto to 24-26 mg bid. Continue metoprolol succinate 12.5 mg every other day.  PAD: Successful intervention to b/l CIA and Rt SFA for severe caludication (02/2020) Successful intervention to Lt SFA ABI 0.8 b/l. Residual mild below the knee disease Continue Aspirin 81 mg daily, Xarelto 2.5 mg  bid  CAD: Nonobstructive CAD, patent LAD stent (08/2021) Improved anginal symptoms on Imdur 15 mg daily.   Continue Plavix 75 mg daily, Xarelto 2.5 mg bid Continue Lipitor 80. LDL down to 64.  BMP, proBNP checked today, results pending F/u in 4 weeks    Nigel Mormon, MD Pager: [phone] Office: [phone]

## 2022-04-30 NOTE — Telephone Encounter (Signed)
Patient home BP is soft on current antihypertensive regimen.   Systolic Blood Pressure 225.7 (50.5 - 183.3) Diastolic Blood Pressure 58.2 (56.0 - 84.0) Heart Rate 50.3 (45.0 - 58.0)  04/29/22 12:18 PM Blip 800 105 / 56 mmHg 56 bpm  04/28/22 8:03 AM Blip 800 109 / 84 mmHg 46 bpm  04/27/22 10:26 AM Blip 800 112 / 69 mmHg 47 bpm  04/26/22 8:36 AM Blip 800 105 / 66 mmHg 58 bpm  04/25/22 11:38 AM Blip 800 93 / 62 mmHg 46 bpm  04/24/22 10:44 AM Blip 800 110 / 76 mmHg 54 bpm  04/24/22 10:37 AM Blip 800 93 / 71 mmHg 45 bpm

## 2022-05-01 LAB — BASIC METABOLIC PANEL
BUN/Creatinine Ratio: 18 (ref 10–24)
BUN: 16 mg/dL (ref 8–27)
CO2: 18 mmol/L — ABNORMAL LOW (ref 20–29)
Calcium: 9.1 mg/dL (ref 8.6–10.2)
Chloride: 102 mmol/L (ref 96–106)
Creatinine, Ser: 0.9 mg/dL (ref 0.76–1.27)
Glucose: 89 mg/dL (ref 70–99)
Potassium: 4.7 mmol/L (ref 3.5–5.2)
Sodium: 139 mmol/L (ref 134–144)
eGFR: 96 mL/min/{1.73_m2} (ref >59)

## 2022-05-01 LAB — PRO B NATRIURETIC PEPTIDE: NT-Pro BNP: 1046 pg/mL — ABNORMAL HIGH (ref 0–210)

## 2022-05-18 ENCOUNTER — Telehealth: Payer: Self-pay | Admitting: Family Medicine

## 2022-05-18 NOTE — Telephone Encounter (Signed)
[facility] to schedule their annual wellness visit. Appointment made for 05/26/2022.  Thank you,  [place] Direct dial  [phone]

## 2022-05-26 ENCOUNTER — Ambulatory Visit (INDEPENDENT_AMBULATORY_CARE_PROVIDER_SITE_OTHER): Payer: Medicare HMO

## 2022-05-26 ENCOUNTER — Encounter (HOSPITAL_BASED_OUTPATIENT_CLINIC_OR_DEPARTMENT_OTHER): Payer: Self-pay

## 2022-05-26 VITALS — Ht 71.0 in | Wt 295.0 lb

## 2022-05-26 DIAGNOSIS — Z0000 Encounter for general adult medical examination without abnormal findings: Secondary | ICD-10-CM

## 2022-05-26 NOTE — Progress Notes (Signed)
Subjective:   Jonathon Cain is a 62 y.o. male who presents for Medicare Annual/Subsequent preventive examination.  Review of Systems    Virtual Visit via Telephone Note  I connected with  Jonathon Cain on 05/26/22 at 10:45 AM EST by telephone and verified that I am speaking with the correct person using two identifiers.  Location: Patient: Home Provider: Office Persons participating in the virtual visit: patient/Nurse Health Advisor   I discussed the limitations, risks, security and privacy concerns of performing an evaluation and management service by telephone and the availability of in person appointments. The patient expressed understanding and agreed to proceed.  Interactive audio and video telecommunications were attempted between this nurse and patient, however failed, due to patient having technical difficulties OR patient did not have access to video capability.  We continued and completed visit with audio only.  Some vital signs may be absent or patient reported.   Criselda Peaches, LPN  Cardiac Risk Factors include: advanced age (>45mn, >>38women);male gender;Other (see comment), Risk factor comments: HHeart Disease     Objective:    Today's Vitals   05/26/22 1059  Weight: 295 lb (133.8 kg)  Height: '5\' 11"'$  (1.803 m)   Body mass index is 41.14 kg/m.     09/16/2021   10:47 AM 02/20/2021    2:01 PM 05/21/2020    6:05 AM 03/19/2020    7:11 AM 11/14/2019   10:34 AM 10/25/2019    2:20 PM 09/19/2019    1:49 PM  Advanced Directives  Does Patient Have a Medical Advance Directive? No No No No Yes No No  Type of APersonnel officerLiving will    Does patient want to make changes to medical advance directive?     No - Patient declined    Copy of HCoushattain Chart?     No - copy requested    Would patient like information on creating a medical advance directive? No - Patient declined No - Patient declined No - Patient  declined No - Patient declined   Yes (MAU/Ambulatory/Procedural Areas - Information given)    Current Medications (verified) Outpatient Encounter Medications as of 05/26/2022  Medication Sig   aspirin EC 81 MG tablet Take 1 tablet (81 mg total) by mouth daily. Swallow whole.   atorvastatin (LIPITOR) 80 MG tablet TAKE ONE TABLET BY MOUTH EVERYDAY AT BEDTIME   cetirizine (ZYRTEC) 10 MG tablet Take 10 mg by mouth daily as needed for allergies.   fluticasone (FLONASE) 50 MCG/ACT nasal spray Place 2 sprays into both nostrils daily as needed for allergies or rhinitis.    furosemide (LASIX) 40 MG tablet Take 1 tablet (40 mg total) by mouth daily.   isosorbide mononitrate (IMDUR) 30 MG 24 hr tablet Take 0.5 tablets (15 mg total) by mouth daily.   metoprolol succinate (TOPROL-XL) 25 MG 24 hr tablet TAKE 1/2 TABLET BY MOUTH Every Other Day at Bedtime   rivaroxaban (XARELTO) 2.5 MG TABS tablet TAKE ONE TABLET BY MOUTH EVERY MORNING and TAKE ONE TABLET BY MOUTH EVERY EVENING   sacubitril-valsartan (ENTRESTO) 24-26 MG Take 1 tablet by mouth 2 (two) times daily.   spironolactone (ALDACTONE) 25 MG tablet Take 0.5 tablets (12.5 mg total) by mouth daily. (Patient taking differently: Take 25 mg by mouth daily.)   tamsulosin (FLOMAX) 0.4 MG CAPS capsule Take 1 capsule (0.4 mg total) by mouth daily as needed (urine flow).   No  facility-administered encounter medications on file as of 05/26/2022.    Allergies (verified) Patient has no known allergies.   History: Past Medical History:  Diagnosis Date   Allergy    Basal cell carcinoma 09/03/2020   sup & nod- Left upper arm-posterior ([id number])   Bladder outlet obstruction    Cancer (HCC)    Skin   CHF (congestive heart failure) (HCC)    Coronary artery disease    Hyperlipidemia    Hypertension    Melanoma ([place]) 09/03/2020   in situ- Left upper back (EXC)   Retinitis pigmentosa    Past Surgical History:  Procedure Laterality Date   ABDOMINAL AORTOGRAM  W/LOWER EXTREMITY N/A 03/19/2020   Procedure: ABDOMINAL AORTOGRAM W/LOWER EXTREMITY;  Surgeon: Nigel Mormon, MD;  Location: [facility];  Service: Cardiovascular;  Laterality: N/A;   CARDIAC CATHETERIZATION     CATARACT EXTRACTION Bilateral 2004   CORONARY BALLOON ANGIOPLASTY N/A 11/14/2019   Procedure: CORONARY BALLOON ANGIOPLASTY;  Surgeon: Nigel Mormon, MD;  Location: [facility];  Service: Cardiovascular;  Laterality: N/A;   CORONARY STENT INTERVENTION N/A 07/26/2019   Procedure: CORONARY STENT INTERVENTION;  Surgeon: Adrian Prows, MD;  Location: [place] [place] [facility];  Service: Cardiovascular;  Laterality: N/A;   CORONARY/GRAFT ACUTE MI REVASCULARIZATION N/A 07/26/2019   Procedure: CORONARY/GRAFT ACUTE MI REVASCULARIZATION;  Surgeon: Adrian Prows, MD;  Location: [facility];  Service: Cardiovascular;  Laterality: N/A;   EYE SURGERY     INTRAVASCULAR PRESSURE WIRE/FFR STUDY N/A 11/14/2019   Procedure: INTRAVASCULAR PRESSURE WIRE/FFR STUDY;  Surgeon: Nigel Mormon, MD;  Location: [facility];  Service: Cardiovascular;  Laterality: N/A;   INTRAVASCULAR PRESSURE WIRE/FFR STUDY N/A 09/16/2021   Procedure: INTRAVASCULAR PRESSURE WIRE/FFR STUDY;  Surgeon: Nigel Mormon, MD;  Location: [facility];  Service: Cardiovascular;  Laterality: N/A;   INTRAVASCULAR ULTRASOUND/IVUS N/A 11/14/2019   Procedure: Intravascular Ultrasound/IVUS;  Surgeon: Nigel Mormon, MD;  Location: [facility];  Service: Cardiovascular;  Laterality: N/A;   LEFT HEART CATH AND CORONARY ANGIOGRAPHY N/A 07/26/2019   Procedure: LEFT HEART CATH AND CORONARY ANGIOGRAPHY;  Surgeon: Adrian Prows, MD;  Location: [facility] [facility];  Service: Cardiovascular;  Laterality: N/A;   LOWER EXTREMITY ANGIOGRAPHY Left 05/21/2020   Procedure: LOWER EXTREMITY ANGIOGRAPHY;  Surgeon: Nigel Mormon, MD;  Location: [facility];  Service: Cardiovascular;  Laterality: Left;    PERIPHERAL VASCULAR BALLOON ANGIOPLASTY Right 03/19/2020   Procedure: PERIPHERAL VASCULAR BALLOON ANGIOPLASTY;  Surgeon: Nigel Mormon, MD;  Location: [place];  Service: Cardiovascular;  Laterality: Right;  SFA   PERIPHERAL VASCULAR BALLOON ANGIOPLASTY  05/21/2020   Procedure: PERIPHERAL VASCULAR BALLOON ANGIOPLASTY;  Surgeon: Nigel Mormon, MD;  Location: [facility] [facility];  Service: Cardiovascular;;  left SFA   PERIPHERAL VASCULAR INTERVENTION Bilateral 03/19/2020   Procedure: PERIPHERAL VASCULAR INTERVENTION;  Surgeon: Nigel Mormon, MD;  Location: [facility];  Service: Cardiovascular;  Laterality: Bilateral;  External Iliac   RIGHT/LEFT HEART CATH AND CORONARY ANGIOGRAPHY N/A 09/16/2021   Procedure: RIGHT/LEFT HEART CATH AND CORONARY ANGIOGRAPHY;  Surgeon: Nigel Mormon, MD;  Location: [facility];  Service: Cardiovascular;  Laterality: N/A;   TUMOR REMOVAL  1961   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   Family History  Problem Relation Age of Onset   Heart disease Mother    Hyperlipidemia Mother    Asthma Mother    Heart disease Father  Hypertension Father    Heart disease Maternal Grandmother    Heart disease Maternal Grandfather    Heart disease Paternal Grandmother    Heart disease Paternal Grandfather    Social History   Socioeconomic History   Marital status: Single    Spouse name: Not on file   Number of children: 0   Years of education: 16   Highest education level: Bachelor's degree (e.g., BA, AB, BS)  Occupational History   Not on file  Tobacco Use   Smoking status: Former    Packs/day: 1.50    Years: 40.00    Total pack years: 60.00    Types: Cigarettes    Quit date: 07/26/2019    Years since quitting: 2.8   Smokeless tobacco: Never  Vaping Use   Vaping Use: Never used  Substance and Sexual Activity   Alcohol use: Not Currently    Comment: occasional   Drug use: No   Sexual activity: Not Currently   Other Topics Concern   Not on file  Social History Narrative   Not on file   Social Determinants of Health   Financial Resource Strain: Low Risk  (05/26/2022)   Overall Financial Resource Strain (CARDIA)    Difficulty of Paying Living Expenses: Not hard at all  Food Insecurity: No Food Insecurity (05/26/2022)   Hunger Vital Sign    Worried About Running Out of Food in the Last Year: Never true    Ran Out of Food in the Last Year: Never true  Transportation Needs: No Transportation Needs (05/26/2022)   PRAPARE - Hydrologist (Medical): No    Lack of Transportation (Non-Medical): No  Physical Activity: Inactive (05/26/2022)   Exercise Vital Sign    Days of Exercise per Week: 0 days    Minutes of Exercise per Session: 0 min  Stress: No Stress Concern Present (05/26/2022)   [place]    Feeling of Stress : Not at all  Social Connections: Moderately Integrated (05/26/2022)   Social Connection and Isolation Panel [NHANES]    Frequency of Communication with Friends and Family: More than three times a week    Frequency of Social Gatherings with Friends and Family: More than three times a week    Attends Religious Services: More than 4 times per year    Active Member of [employer] or Organizations: Yes    Attends Music therapist: More than 4 times per year    Marital Status: Never married    Tobacco Counseling Counseling given: Yes   Clinical Intake:  Pre-visit preparation completed: Yes  Pain : No/denies pain     BMI - recorded: 41.14 Nutritional Status: BMI > 30  Obese Nutritional Risks: None Diabetes: No  How often do you need to have someone help you when you read instructions, pamphlets, or other written materials from your doctor or pharmacy?: 1 - Never  Diabetic? No  Interpreter Needed?: No  Information entered by :: Rolene Arbour LPN   Activities of Daily  Living    05/26/2022   11:04 AM 05/22/2022    1:34 PM  In your present state of health, do you have any difficulty performing the following activities:  Hearing? 0 0  Vision? 0 1  Difficulty concentrating or making decisions? 0 0  Walking or climbing stairs? 0 1  Dressing or bathing? 0 0  Doing errands, shopping? 0 0  Preparing Food and eating ? N  N  Using the Toilet? N N  In the past six months, have you accidently leaked urine? N Y  Do you have problems with loss of bowel control? N Y  Managing your Medications? N N  Managing your Finances? N N  Housekeeping or managing your Housekeeping? N Y    Patient Care Team: [place], [name], MD as PCP - General (Family Medicine) Nigel Mormon, MD as Consulting Physician (Cardiology) Peter Congo, MD as Referring Physician (Ophthalmology) Lavonna Monarch, MD (Inactive) as Consulting Physician (Dermatology) Edythe Clarity, [facility] (Pharmacist)  Indicate any recent Medical Services you may have received from other than [facility] providers in the past year (date may be approximate).     Assessment:   This is a routine wellness examination for Valerio.  Hearing/Vision screen Hearing Screening - Comments:: Denies hearing difficulties   Vision Screening - Comments:: Wears rx glasses - up to date with routine eye exams with  [place] issues and exercise activities discussed: Current Exercise Habits: The patient does not participate in regular exercise at present, Exercise limited by: None identified   Goals Addressed               This Visit's Progress     No curent goals (pt-stated)         Depression Screen    05/26/2022   11:03 AM 04/13/2022    1:49 PM 02/25/2022    1:37 PM 06/16/2021    3:02 PM 02/20/2021    2:02 PM 02/20/2021    1:59 PM 01/20/2021    2:05 PM  PHQ 2/9 Scores  PHQ - 2 Score 0 2 0 0 0 0 0  PHQ- 9 Score 0 9 0 0   0  Exception Documentation  Medical reason Medical reason        Fall  Risk    05/26/2022   11:07 AM 05/22/2022    1:34 PM 02/25/2022    1:37 PM 06/16/2021    3:02 PM 02/20/2021    2:02 PM  [place] in the past year? 0 0 0 0 0  Number falls in past yr: 0 0 0 0 0  Injury with Fall? 0 0 0 0 0  Risk for fall due to : No Fall Risks  No Fall Risks No Fall Risks   Follow up Falls prevention discussed  Falls evaluation completed Falls evaluation completed Falls evaluation completed    [place]:  Any stairs in or around the home? Yes  If so, are there any without handrails? No  Home free of loose throw rugs in walkways, pet beds, electrical cords, etc? Yes  Adequate lighting in your home to reduce risk of falls? Yes   ASSISTIVE DEVICES UTILIZED TO PREVENT FALLS:  Life alert? No  Use of a cane, walker or w/c? No  Grab bars in the bathroom? No  Shower chair or bench in shower? No  Elevated toilet seat or a handicapped toilet? No   TIMED UP AND GO:  Was the test performed? No . Audio Visit    Cognitive Function:        05/26/2022   11:07 AM  6CIT Screen  What Year? 0 points  What month? 0 points  What time? 0 points  Count back from 20 0 points  Months in reverse 0 points  Repeat phrase 0 points  Total Score 0 points    Immunizations Immunization History  Administered Date(s) Administered   Influenza,inj,Quad PF,6+ Mos 12/03/2020, 04/13/2022   PFIZER(Purple Top)SARS-COV-2 Vaccination 12/14/2019, 01/04/2020   PNEUMOCOCCAL CONJUGATE-20 12/03/2020      Flu Vaccine status: Up to date    Covid-19 vaccine status: Completed vaccines  Qualifies for Shingles Vaccine? Yes   Zostavax completed No   Shingrix Completed?: No.    Education has been provided regarding the importance of this vaccine. Patient has been advised to call insurance company to determine out of pocket expense if they have not yet received this vaccine. Advised may also receive vaccine at local pharmacy or Health Dept. Verbalized  acceptance and understanding.  Screening Tests Health Maintenance  Topic Date Due   COVID-19 Vaccine (3 - Pfizer risk series) 06/11/2022 (Originally 02/01/2020)   Zoster Vaccines- Shingrix (1 of 2) 07/27/2022 (Originally [birth year]1977)   Hepatitis C Screening  05/26/2023 (Originally 09/14/1976)   HIV Screening  05/26/2023 (Originally 09/14/1973)   Lung Cancer Screening  01/03/2023   Medicare Annual Wellness (AWV)  05/26/2023   COLONOSCOPY (Pts 45-31yr Insurance coverage will need to be confirmed)  06/28/2028   INFLUENZA VACCINE  Completed   HPV VACCINES  Aged Out   DTaP/Tdap/Td  Discontinued    Health Maintenance  There are no preventive care reminders to display for this patient.   Colorectal cancer screening: Type of screening: Colonoscopy. Completed 06/29/18. Repeat every 10 years  Lung Cancer Screening: (Low Dose CT Chest recommended if Age 203-80years, 30 pack-year currently smoking OR have quit w/in 15years.) does qualify.   Lung Cancer Screening Referral: Deferred  Additional Screening:  Hepatitis C Screening: does qualify; Deferred  Vision Screening: Recommended annual ophthalmology exams for early detection of glaucoma and other disorders of the eye. Is the patient up to date with their annual eye exam?  Yes  Who is the provider or what is the name of the office in which the patient attends annual eye exams? [place] is not established with a provider, would they like to be referred to a provider to establish care? No .   Dental Screening: Recommended annual dental exams for proper oral hygiene  Community Resource Referral / Chronic Care Management:  CRR required this visit?  No   CCM required this visit?  No      Plan:     I have personally reviewed and noted the following in the patient's chart:   Medical and social history Use of alcohol, tobacco or illicit drugs  Current medications and supplements including opioid prescriptions. Patient is not  currently taking opioid prescriptions. Functional ability and status Nutritional status Physical activity Advanced directives List of other physicians Hospitalizations, surgeries, and ER visits in previous 12 months Vitals Screenings to include cognitive, depression, and falls Referrals and appointments  In addition, I have reviewed and discussed with patient certain preventive protocols, quality metrics, and best practice recommendations. A written personalized care plan for preventive services as well as general preventive health recommendations were provided to patient.     BCriselda Peaches LPN   [id number]  Nurse Notes: Patient due Hep-C and HIV screening

## 2022-05-26 NOTE — Patient Instructions (Addendum)
Jonathon Cain , Thank you for taking time to come for your Medicare Wellness Visit. I appreciate your ongoing commitment to your health goals. Please review the following plan we discussed and let me know if I can assist you in the future.   These are the goals we discussed:  Goals       No curent goals (pt-stated)      Track and Manage Fluids and Swelling-Heart Failure      Timeframe:  Long-Range Goal Priority:  High Start Date:   07/02/21                          Expected End Date:  01/01/22                    Follow Up Date 10/01/21    - call office if I gain more than 2 pounds in one day or 5 pounds in one week - keep legs up while sitting - weigh myself daily    Why is this important?   It is important to check your weight daily and watch how much salt and liquids you have.  It will help you to manage your heart failure.    Notes:         This is a list of the screening recommended for you and due dates:  Health Maintenance  Topic Date Due   COVID-19 Vaccine (3 - Pfizer risk series) 06/11/2022*   Zoster (Shingles) Vaccine (1 of 2) 07/27/2022*   Hepatitis C Screening: USPSTF Recommendation to screen - Ages 18-79 yo.  05/26/2023*   HIV Screening  05/26/2023*   Screening for Lung Cancer  01/03/2023   Medicare Annual Wellness Visit  05/26/2023   Colon Cancer Screening  06/28/2028   Flu Shot  Completed   HPV Vaccine  Aged Out   DTaP/Tdap/Td vaccine  Discontinued  *Topic was postponed. The date shown is not the original due date.    Advanced directives: Advance directive discussed with you today. Even though you declined this today, please call our office should you change your mind, and we can give you the proper paperwork for you to fill out.   Conditions/risks identified: None  Next appointment: Follow up in one year for your annual wellness visit   Preventive Care 40-64 Years, Male Preventive care refers to lifestyle choices and visits with your health care provider  that can promote health and wellness. What does preventive care include? A yearly physical exam. This is also called an annual well check. Dental exams once or twice a year. Routine eye exams. Ask your health care provider how often you should have your eyes checked. Personal lifestyle choices, including: Daily care of your teeth and gums. Regular physical activity. Eating a healthy diet. Avoiding tobacco and drug use. Limiting alcohol use. Practicing safe sex. Taking low-dose aspirin every day starting at age 42. What happens during an annual well check? The services and screenings done by your health care provider during your annual well check will depend on your age, overall health, lifestyle risk factors, and family history of disease. Counseling  Your health care provider may ask you questions about your: Alcohol use. Tobacco use. Drug use. Emotional well-being. Home and relationship well-being. Sexual activity. Eating habits. Work and work Statistician. Screening  You may have the following tests or measurements: Height, weight, and BMI. Blood pressure. Lipid and cholesterol levels. These may be checked every 5 years, or more  frequently if you are over 8 years old. Skin check. Lung cancer screening. You may have this screening every year starting at age 69 if you have a 30-pack-year history of smoking and currently smoke or have quit within the past 15 years. Fecal occult blood test (FOBT) of the stool. You may have this test every year starting at age 68. Flexible sigmoidoscopy or colonoscopy. You may have a sigmoidoscopy every 5 years or a colonoscopy every 10 years starting at age 18. Prostate cancer screening. Recommendations will vary depending on your family history and other risks. Hepatitis C blood test. Hepatitis B blood test. Sexually transmitted disease (STD) testing. Diabetes screening. This is done by checking your blood sugar (glucose) after you have not  eaten for a while (fasting). You may have this done every 1-3 years. Discuss your test results, treatment options, and if necessary, the need for more tests with your health care provider. Vaccines  Your health care provider may recommend certain vaccines, such as: Influenza vaccine. This is recommended every year. Tetanus, diphtheria, and acellular pertussis (Tdap, Td) vaccine. You may need a Td booster every 10 years. Zoster vaccine. You may need this after age 20. Pneumococcal 13-valent conjugate (PCV13) vaccine. You may need this if you have certain conditions and have not been vaccinated. Pneumococcal polysaccharide (PPSV23) vaccine. You may need one or two doses if you smoke cigarettes or if you have certain conditions. Talk to your health care provider about which screenings and vaccines you need and how often you need them. This information is not intended to replace advice given to you by your health care provider. Make sure you discuss any questions you have with your health care provider. Document Released: 04/05/2015 Document Revised: 11/27/2015 Document Reviewed: 01/08/2015 Elsevier Interactive Patient Education  2017 [place] Prevention in the Home Falls can cause injuries. They can happen to people of all ages. There are many things you can do to make your home safe and to help prevent falls. What can I do on the outside of my home? Regularly fix the edges of walkways and driveways and fix any cracks. Remove anything that might make you trip as you walk through a door, such as a raised step or threshold. Trim any bushes or trees on the path to your home. Use bright outdoor lighting. Clear any walking paths of anything that might make someone trip, such as rocks or tools. Regularly check to see if handrails are loose or broken. Make sure that both sides of any steps have handrails. Any raised decks and porches should have guardrails on the edges. Have any leaves,  snow, or ice cleared regularly. Use sand or salt on walking paths during winter. Clean up any spills in your garage right away. This includes oil or grease spills. What can I do in the bathroom? Use night lights. Install grab bars by the toilet and in the tub and shower. Do not use towel bars as grab bars. Use non-skid mats or decals in the tub or shower. If you need to sit down in the shower, use a plastic, non-slip stool. Keep the floor dry. Clean up any water that spills on the floor as soon as it happens. Remove soap buildup in the tub or shower regularly. Attach bath mats securely with double-sided non-slip rug tape. Do not have throw rugs and other things on the floor that can make you trip. What can I do in the bedroom? Use night lights. Make sure that  you have a light by your bed that is easy to reach. Do not use any sheets or blankets that are too big for your bed. They should not hang down onto the floor. Have a firm chair that has side arms. You can use this for support while you get dressed. Do not have throw rugs and other things on the floor that can make you trip. What can I do in the kitchen? Clean up any spills right away. Avoid walking on wet floors. Keep items that you use a lot in easy-to-reach places. If you need to reach something above you, use a strong step stool that has a grab bar. Keep electrical cords out of the way. Do not use floor polish or wax that makes floors slippery. If you must use wax, use non-skid floor wax. Do not have throw rugs and other things on the floor that can make you trip. What can I do with my stairs? Do not leave any items on the stairs. Make sure that there are handrails on both sides of the stairs and use them. Fix handrails that are broken or loose. Make sure that handrails are as long as the stairways. Check any carpeting to make sure that it is firmly attached to the stairs. Fix any carpet that is loose or worn. Avoid having throw  rugs at the top or bottom of the stairs. If you do have throw rugs, attach them to the floor with carpet tape. Make sure that you have a light switch at the top of the stairs and the bottom of the stairs. If you do not have them, ask someone to add them for you. What else can I do to help prevent falls? Wear shoes that: Do not have high heels. Have rubber bottoms. Are comfortable and fit you well. Are closed at the toe. Do not wear sandals. If you use a stepladder: Make sure that it is fully opened. Do not climb a closed stepladder. Make sure that both sides of the stepladder are locked into place. Ask someone to hold it for you, if possible. Clearly mark and make sure that you can see: Any grab bars or handrails. First and last steps. Where the edge of each step is. Use tools that help you move around (mobility aids) if they are needed. These include: Canes. Walkers. Scooters. Crutches. Turn on the lights when you go into a dark area. Replace any light bulbs as soon as they burn out. Set up your furniture so you have a clear path. Avoid moving your furniture around. If any of your floors are uneven, fix them. If there are any pets around you, be aware of where they are. Review your medicines with your doctor. Some medicines can make you feel dizzy. This can increase your chance of falling. Ask your doctor what other things that you can do to help prevent falls. This information is not intended to replace advice given to you by your health care provider. Make sure you discuss any questions you have with your health care provider. Document Released: 01/03/2009 Document Revised: 08/15/2015 Document Reviewed: 04/13/2014 Elsevier Interactive Patient Education  2017 [employer].

## 2022-05-28 ENCOUNTER — Ambulatory Visit: Payer: Medicare HMO | Admitting: Cardiology

## 2022-05-28 ENCOUNTER — Encounter: Payer: Self-pay | Admitting: Cardiology

## 2022-05-28 ENCOUNTER — Telehealth: Payer: Self-pay

## 2022-05-28 VITALS — BP 102/57 | HR 52 | Resp 16 | Ht 71.0 in | Wt 294.0 lb

## 2022-05-28 DIAGNOSIS — I25118 Atherosclerotic heart disease of native coronary artery with other forms of angina pectoris: Secondary | ICD-10-CM

## 2022-05-28 DIAGNOSIS — I5020 Unspecified systolic (congestive) heart failure: Secondary | ICD-10-CM

## 2022-05-28 DIAGNOSIS — I739 Peripheral vascular disease, unspecified: Secondary | ICD-10-CM

## 2022-05-28 MED ORDER — SPIRONOLACTONE 25 MG PO TABS: 25.0000 mg | ORAL_TABLET | ORAL | 3 refills | Status: DC

## 2022-05-28 NOTE — Telephone Encounter (Signed)
Patient home blood pressure remains controlled but soft on current antihypertensive regimen.  Systolic Blood Pressure mmHg -- 105.0 ([id number] - [id number]) Diastolic Blood Pressure mmHg -- 66.7 (53.0 - 90.0) Heart Rate bpm -- 51.3 (41.0 - 60.0)  05/27/22 8:14 AM  98 / 63 mmHg 55 bpm  05/26/22 12:07 PM  100 / 62 mmHg 50 bpm  05/25/22 10:22 AM  104 / 63 mmHg 56 bpm  05/24/22 11:49 AM  110 / 61 mmHg 44 bpm  05/23/22 5:10 PM  101 / 65 mmHg 55 bpm  05/23/22 3:35 PM  87 / 53 mmHg 57 bpm  05/23/22 3:34 PM  88 / 54 mmHg 58 bpm  05/22/22 1:17 PM  118 / 82 mmHg 57 bpm  05/21/22 2:01 PM  106 / 75 mmHg 59 bpm

## 2022-05-28 NOTE — Progress Notes (Signed)
Patient referred by [place], Jonathon Reveal, MD for CAD  Subjective:   Jonathon Cain, male    DOB: 06/20/1960, 62 y.o.   MRN: [id number]   Chief Complaint  Patient presents with   HFrEF   Follow-up    4 week     HPI   62 y.o. 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  Patient home blood pressure remains controlled but soft on current antihypertensive regimen.   Systolic Blood Pressure      mmHg  --          105.0 ([id number] - 123456) Diastolic Blood Pressure     mmHg  --          66.7 (53.0 - 90.0) Heart Rate      bpm     --          51.3 (41.0 - 60.0)   05/27/22 8:14 AM                       98        /           63        mmHg  55        bpm      05/26/22 12:07 PM                     100      /           62        mmHg  50        bpm      05/25/22 10:22 AM                     104      /           63        mmHg  56        bpm      05/24/22 11:49 AM                     110      /           61        mmHg  44        bpm      05/23/22 5:10 PM                       101      /           65        mmHg  55        bpm      05/23/22 3:35 PM                       87        /           53        mmHg  57        bpm      05/23/22 3:34 PM  88        /           54        mmHg  58        bpm      05/22/22 1:17 PM                       118      /           82        mmHg  57        bpm      05/21/22 2:01 PM                     106      /           75        mmHg  59        bpm  He is staying about the same with his lightheadedness as well as shortness of breath.  He has not had any syncope episode.  Denies any leg swelling. Reviewed recent test results with the patient, details below.       Current Outpatient Medications:    aspirin EC 81 MG tablet, Take 1 tablet (81 mg total) by  mouth daily. Swallow whole., Disp: 90 tablet, Rfl: 3   atorvastatin (LIPITOR) 80 MG tablet, TAKE ONE TABLET BY MOUTH EVERYDAY AT BEDTIME, Disp: 103 tablet, Rfl: 3   cetirizine (ZYRTEC) 10 MG tablet, Take 10 mg by mouth daily as needed for allergies., Disp: , Rfl:    fluticasone (FLONASE) 50 MCG/ACT nasal spray, Place 2 sprays into both nostrils daily as needed for allergies or rhinitis. , Disp: , Rfl:    furosemide (LASIX) 40 MG tablet, Take 1 tablet (40 mg total) by mouth daily., Disp: 1 tablet, Rfl: 0   isosorbide mononitrate (IMDUR) 30 MG 24 hr tablet, Take 0.5 tablets (15 mg total) by mouth daily., Disp: 30 tablet, Rfl: 3   metoprolol succinate (TOPROL-XL) 25 MG 24 hr tablet, TAKE 1/2 TABLET BY MOUTH Every Other Day at Bedtime, Disp: 45 tablet, Rfl: 2   rivaroxaban (XARELTO) 2.5 MG TABS tablet, TAKE ONE TABLET BY MOUTH EVERY MORNING and TAKE ONE TABLET BY MOUTH EVERY EVENING, Disp: 180 tablet, Rfl: 3   sacubitril-valsartan (ENTRESTO) 24-26 MG, Take 1 tablet by mouth 2 (two) times daily., Disp: 60 tablet, Rfl: 5   spironolactone (ALDACTONE) 25 MG tablet, Take 0.5 tablets (12.5 mg total) by mouth daily. (Patient taking differently: Take 25 mg by mouth daily.), Disp: 30 tablet, Rfl: 3   tamsulosin (FLOMAX) 0.4 MG CAPS capsule, Take 1 capsule (0.4 mg total) by mouth daily as needed (urine flow)., Disp: 90 capsule, Rfl: 1    Cardiovascular and other pertinent studies:  EKG 04/02/2022: Sinus rhythm 65 bpm First degree A-V block Frequent pvcs -ventricular trigeminy  Left axis -anterior fascicular block Nonspecific T-abnormality  RHC/LHC 09/16/2021: LM: Normal LAD: Patent prox LAD stent with 40% late lumen loss         RFR 0.94 (physiologically non-significant) Lcx: OM1 ostial 50% stenosis (Improved from previous cath in 2021-80% with negative RFR then) RCA: Distal 20%, ostial PDA 50% stenoses   LV: 121/11 mmHg LVEDP: 23 mmHg   RA: 10 mmHg RV: 78/4 mmHg PA: 72/23 mmHg, mPAP 40 mmHg PCW:  24 mmHg   CO: 6.0 L/min CI: 2.4 L/min/m2   No indication for  revascularization. Continue optimization of GDMT for HFrEF  Echocardiogram 08/21/2021:  Left ventricle cavity is normal in size and wall thickness. Severe global  hypokinesis with akinetic apex. LVEF 25-30%. Doppler evidence of grade III  (restrictive) diastolic dysfunction, elevated LAP.  Mild to moderate mitral regurgitation.  Normal right atrial pressure.  Compared to previous study on 09/18/2020, LVEF has further reduced from  around 35%.   ABI 08/21/2021:  This exam reveals normal perfusion of the right lower extremity (ABI  1.08).    This exam reveals normal perfusion of the left lower extremity (ABI 1.01).   Compared to the study done on 04/17/2020, left ABI has improved from 0.75.    No change in the right.  This represents successful revascularization of  the bilateral SFA by history.  Markedly abnormal monophasic waveform pattern in bilateral posterior  tibial artery.  Lexiscan Tetrofosmin stress test 09/18/2020: Lexiscan nuclear stress test performed using 1-day protocol. Patient experienced dyspnea and chest discomfort SPECT images showed large sized, moderate intensity, fixed perfusion defect in mid to apical, anterior, inferior, and apical myocardium. There is severe decrease in myocardial thickening and wall motion. Stress LVEF 12%. High risk study.  Cardiac MRI 02/01/2020: 1. Subendocardial late gadolinium enhancement consistent with prior infarct in LAD territory. There is >50% transmural LGE suggesting nonviability in mid anterior/anteroseptal walls, apical anterior/septal/inferior walls, and apex. 2. Mild LV dilatation with moderate systolic function (EF 123456). Akinesis of mid to apical anterior, mid anteroseptal, apical septal, apical inferior, and apex. 3.  Normal RV size and systolic function (EF 123XX123) 4.  Moderate mitral regurgitation (regurgitant fraction 24%) 5.  Moderate right pleural effusion    Coronary angiography 11/14/2019: LM: Diffuse 30% disease. IVUS MLA 7.3 mm2,. dFR 1.0 LAD: Patent prox LAD stent with udner expansion        Optimization with 3.5X15 mm [place] balloon at 22 atm        Excellent expansion as seen on IVUS        TIMI flow II-->III        Diag: Mod diffuse disease LCx: 80% ostial OM1 stenosis. dFR 0.98 RCA: Not engaged today  Left Heart Catheterization 07/26/2019 (Dr. Einar Gip):  LM: Mild diffuse disease LAD: Very large caliber vessel, occluded at the proximal end.  Gives origin to high D1 which is large.  Successful PTCA and stenting of the proximal LAD overlapping D1, 3.5 x 24 mm Synergy DES deployed at 12 atmospheric pressure for 60 S. 100% to 0% with TIMI 0 to TIMI-3 flow. LCx: Large vessel, giving origin to a very large OM1 which is secondary branches.  Ostium of OM1 has 80% stenosis. RCA: Mild diffuse disease, distal RCA 15 to 20% stenosis, PDA 50% stenosis.   Recommendation: Patient will need aggressive risk factor modification, dual antiplatelet therapy with aspirin and Brilinta for a year, will be watched carefully in the intensive care unit for preshock, suspect his EDP is extremely high at the end of the procedure with excessive contrast use.  Will monitor for any contrast nephropathy as well.  Recent labs: 04/30/2022: Glucose 89, BUN/Cr 16/0.9. EGFR 96. Na/K 139/4.7 NTproBNP 1,046  03/26/2022: Glucose 72, BUN/Cr 11/0.91. EGFR 95. Na/K 137/4.8. AlKP 148. Rest of the CMP normal H/H 15/47. MCV 86. Platelets 278 HbA1C 5.5% Chol 107, TG 88, HDL 32, LDL 58 TSH 1.9 normal NTproBNP 1,279  12/03/2022: NTproBNP 709  09/03/2021: NT-proBNP 502  10/29/2020: NT pro BNP 733     Review of Systems  Cardiovascular:  Positive for  dyspnea on exertion. Negative for chest pain, leg swelling, palpitations and syncope.  Gastrointestinal:  Negative for diarrhea.  Neurological:  Positive for light-headedness.         Vitals:   05/28/22 1106  BP: (!) 102/57   Pulse: (!) 52  Resp: 16  SpO2: 95%      Body mass index is 41 kg/m. Filed Weights   05/28/22 1106  Weight: 294 lb (133.4 kg)       Objective:   Physical Exam Vitals and nursing note reviewed.  Constitutional:      General: He is not in acute distress. Neck:     Vascular: No JVD.  Cardiovascular:     Rate and Rhythm: Normal rate and regular rhythm.     Pulses:          Dorsalis pedis pulses are 1+ on the left side.       Posterior tibial pulses are 0 on the right side and 1+ on the left side.     Heart sounds: Normal heart sounds. No murmur heard. Pulmonary:     Effort: Pulmonary effort is normal.     Breath sounds: Normal breath sounds. No wheezing or rales.  Musculoskeletal:     Right lower leg: No edema.     Left lower leg: No edema.     VISIT DIAGNOSES:    No diagnosis found.    Assessment & Recommendations:   62 y.o. 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 While he looks euvolemic on exam, he continues to have NYHA class II-III dyspnea, as well as lightheadedness. ProBNP 1000 in 04/2022. Increase spironolactone to 25 mg daily on the days he does not take metoprolol succinate 12.5 mg, take spironolactone 12.5 mg on days he takes metoprolol succinate 12.5 mg.  Continue Entresto to 24-26 mg bid. SGLT2i stopped due to diarrhea.  Check BMP and proBNP today and repeat in 3 weeks. Continue home blood pressure monitoring.  PAD: Successful intervention to b/l CIA and Rt SFA for severe caludication (02/2020) Successful intervention to Lt SFA ABI 0.8 b/l. Residual mild below the knee disease Continue Aspirin 81 mg daily,  Xarelto 2.5 mg bid  CAD: Nonobstructive CAD, patent LAD stent (08/2021) Improved anginal symptoms on Imdur 15 mg daily.   Continue Plavix 75 mg daily, Xarelto 2.5 mg bid Chol 107, TG 88, HDL 32, LDL 58 (03/2022) Continue lipitor 80 mg daily.   F/u in 4 weeks    Nigel Mormon, MD Pager: [phone] Office: [phone]

## 2022-05-29 LAB — BASIC METABOLIC PANEL
BUN/Creatinine Ratio: 17 (ref 10–24)
BUN: 17 mg/dL (ref 8–27)
CO2: 18 mmol/L — ABNORMAL LOW (ref 20–29)
Calcium: 8.7 mg/dL (ref 8.6–10.2)
Chloride: 104 mmol/L (ref 96–106)
Creatinine, Ser: 1.02 mg/dL (ref 0.76–1.27)
Glucose: 99 mg/dL (ref 70–99)
Potassium: 4.7 mmol/L (ref 3.5–5.2)
Sodium: 138 mmol/L (ref 134–144)
eGFR: 83 mL/min/{1.73_m2} (ref >59)

## 2022-05-29 LAB — PRO B NATRIURETIC PEPTIDE: NT-Pro BNP: 509 pg/mL — ABNORMAL HIGH (ref 0–210)

## 2022-05-29 NOTE — Progress Notes (Signed)
Patient aware.

## 2022-05-29 NOTE — Progress Notes (Signed)
Okay with me since proBNP has indeed improved.  Thanks MJP

## 2022-05-29 NOTE — Progress Notes (Signed)
Called and spoke with patient regarding his results. Patient thinks the better plan is to do a 1/2 pill everyday. He wants to know what are your thoughts on this? Please advise.

## 2022-06-24 DIAGNOSIS — I5020 Unspecified systolic (congestive) heart failure: Secondary | ICD-10-CM | POA: Diagnosis not present

## 2022-06-25 ENCOUNTER — Encounter: Payer: Self-pay | Admitting: Cardiology

## 2022-06-25 ENCOUNTER — Ambulatory Visit: Payer: Medicare HMO | Admitting: Cardiology

## 2022-06-25 ENCOUNTER — Telehealth: Payer: Self-pay

## 2022-06-25 VITALS — BP 90/51 | HR 52 | Resp 16 | Ht 71.0 in | Wt 303.0 lb

## 2022-06-25 DIAGNOSIS — I739 Peripheral vascular disease, unspecified: Secondary | ICD-10-CM | POA: Diagnosis not present

## 2022-06-25 DIAGNOSIS — I25118 Atherosclerotic heart disease of native coronary artery with other forms of angina pectoris: Secondary | ICD-10-CM | POA: Diagnosis not present

## 2022-06-25 DIAGNOSIS — I5020 Unspecified systolic (congestive) heart failure: Secondary | ICD-10-CM | POA: Diagnosis not present

## 2022-06-25 LAB — BASIC METABOLIC PANEL
BUN/Creatinine Ratio: 14 (ref 10–24)
BUN: 14 mg/dL (ref 8–27)
CO2: 20 mmol/L (ref 20–29)
Calcium: 8.4 mg/dL — ABNORMAL LOW (ref 8.6–10.2)
Chloride: 101 mmol/L (ref 96–106)
Creatinine, Ser: 1.02 mg/dL (ref 0.76–1.27)
Glucose: 89 mg/dL (ref 70–99)
Potassium: 4.8 mmol/L (ref 3.5–5.2)
Sodium: 138 mmol/L (ref 134–144)
eGFR: 83 mL/min/{1.73_m2} (ref >59)

## 2022-06-25 LAB — PRO B NATRIURETIC PEPTIDE: NT-Pro BNP: 586 pg/mL — ABNORMAL HIGH (ref 0–210)

## 2022-06-25 NOTE — Telephone Encounter (Signed)
Patient home blood pressure remains soft at baseline. Last month's average was 104/66. Patient denies hypotensive symptoms when BP is very soft.   Blood pressure and heart rate 06/25/22 7:02 AM 113 / 72 58     06/24/22 11:54 AM 93 / 61 48     06/24/22 11:53 AM 100 / 51 54     06/24/22 10:13 AM 98 / 72 53     06/24/22 9:57 AM 92 / 41 51     06/23/22 12:37 PM 110 / 78 54     06/22/22 4:13 PM 93 / 59 50     06/22/22 4:12 PM 92 / 52 54     06/21/22 11:51 AM 98 / 59 52     06/20/22 1:12 PM 102 / 70 56

## 2022-06-25 NOTE — Progress Notes (Signed)
Patient referred by [place], [name], MD for CAD  Subjective:   Jonathon Cain, male    DOB: 1960/09/30, 62 y.o.   MRN: [id number]   Chief Complaint  Patient presents with   HFrEF   Follow-up    4 week     HPI   62 y.o. 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  Patient home blood pressure remains soft at baseline. Last month's average was 104/66. Patient denies hypotensive symptoms when BP is very soft.    Blood pressure and heart rate 06/25/22 7:02 AM           113      /           72        58                                 06/24/22 11:54 AM         93        /           61        48                                 06/24/22 11:53 AM         100      /           51        54                                 06/24/22 10:13 AM         98        /           72        53                                 06/24/22 9:57 AM           92        /           41        51                                 06/23/22 12:37 PM         110      /           78        54                                 06/22/22 4:13 PM           93        /           59        50  06/22/22 4:12 PM           92        /           52        54                                 06/21/22 11:51 AM       98        /           59        52                                 06/20/22 1:12 PM         102      /           70        56           Reviewed recent test results with the patient, details below.     Current Outpatient Medications:    aspirin EC 81 MG tablet, Take 1 tablet (81 mg total) by mouth daily. Swallow whole., Disp: 90 tablet, Rfl: 3   atorvastatin (LIPITOR) 80 MG tablet, TAKE ONE TABLET BY MOUTH EVERYDAY AT BEDTIME, Disp: 103 tablet, Rfl: 3   cetirizine (ZYRTEC) 10 MG tablet, Take 10  mg by mouth daily as needed for allergies., Disp: , Rfl:    fluticasone (FLONASE) 50 MCG/ACT nasal spray, Place 2 sprays into both nostrils daily as needed for allergies or rhinitis. , Disp: , Rfl:    furosemide (LASIX) 40 MG tablet, Take 1 tablet (40 mg total) by mouth daily., Disp: 1 tablet, Rfl: 0   isosorbide mononitrate (IMDUR) 30 MG 24 hr tablet, Take 0.5 tablets (15 mg total) by mouth daily., Disp: 30 tablet, Rfl: 3   metoprolol succinate (TOPROL-XL) 25 MG 24 hr tablet, TAKE 1/2 TABLET BY MOUTH Every Other Day at Bedtime, Disp: 45 tablet, Rfl: 2   rivaroxaban (XARELTO) 2.5 MG TABS tablet, TAKE ONE TABLET BY MOUTH EVERY MORNING and TAKE ONE TABLET BY MOUTH EVERY EVENING, Disp: 180 tablet, Rfl: 3   sacubitril-valsartan (ENTRESTO) 24-26 MG, Take 1 tablet by mouth 2 (two) times daily., Disp: 60 tablet, Rfl: 5   spironolactone (ALDACTONE) 25 MG tablet, Take 1 tablet (25 mg total) by mouth as directed. 12.5 and 25 every other day, Disp: 90 tablet, Rfl: 3   tamsulosin (FLOMAX) 0.4 MG CAPS capsule, Take 1 capsule (0.4 mg total) by mouth daily as needed (urine flow)., Disp: 90 capsule, Rfl: 1    Cardiovascular and other pertinent studies:  EKG 04/02/2022: Sinus rhythm 65 bpm First degree A-V block Frequent pvcs -ventricular trigeminy  Left axis -anterior fascicular block Nonspecific T-abnormality  RHC/LHC 09/16/2021: LM: Normal LAD: Patent prox LAD stent with 40% late lumen loss         RFR 0.94 (physiologically non-significant) Lcx: OM1 ostial 50% stenosis (Improved from previous cath in 2021-80% with negative RFR then) RCA: Distal 20%, ostial PDA 50% stenoses   LV: 121/11 mmHg LVEDP: 23 mmHg   RA: 10 mmHg RV: 78/4 mmHg PA: 72/23 mmHg, mPAP 40 mmHg PCW: 24 mmHg   CO: 6.0 L/min CI: 2.4 L/min/m2   No indication for revascularization. Continue optimization of GDMT for HFrEF  Echocardiogram 08/21/2021:  Left ventricle  cavity is normal in size and wall thickness. Severe global   hypokinesis with akinetic apex. LVEF 25-30%. Doppler evidence of grade III  (restrictive) diastolic dysfunction, elevated LAP.  Mild to moderate mitral regurgitation.  Normal right atrial pressure.  Compared to previous study on 09/18/2020, LVEF has further reduced from  around 35%.   ABI 08/21/2021:  This exam reveals normal perfusion of the right lower extremity (ABI  1.08).    This exam reveals normal perfusion of the left lower extremity (ABI 1.01).   Compared to the study done on 04/17/2020, left ABI has improved from 0.75.    No change in the right.  This represents successful revascularization of  the bilateral SFA by history.  Markedly abnormal monophasic waveform pattern in bilateral posterior  tibial artery.  Lexiscan Tetrofosmin stress test 09/18/2020: Lexiscan nuclear stress test performed using 1-day protocol. Patient experienced dyspnea and chest discomfort SPECT images showed large sized, moderate intensity, fixed perfusion defect in mid to apical, anterior, inferior, and apical myocardium. There is severe decrease in myocardial thickening and wall motion. Stress LVEF 12%. High risk study.  Cardiac MRI 02/01/2020: 1. Subendocardial late gadolinium enhancement consistent with prior infarct in LAD territory. There is >50% transmural LGE suggesting nonviability in mid anterior/anteroseptal walls, apical anterior/septal/inferior walls, and apex. 2. Mild LV dilatation with moderate systolic function (EF 123456). Akinesis of mid to apical anterior, mid anteroseptal, apical septal, apical inferior, and apex. 3.  Normal RV size and systolic function (EF 123XX123) 4.  Moderate mitral regurgitation (regurgitant fraction 24%) 5.  Moderate right pleural effusion   Coronary angiography 11/14/2019: LM: Diffuse 30% disease. IVUS MLA 7.3 mm2,. dFR 1.0 LAD: Patent prox LAD stent with udner expansion        Optimization with 3.5X15 mm [place] balloon at 22 atm        Excellent expansion as seen on  IVUS        TIMI flow II-->III        Diag: Mod diffuse disease LCx: 80% ostial OM1 stenosis. dFR 0.98 RCA: Not engaged today  Left Heart Catheterization 07/26/2019 (Dr. Einar Gip):  LM: Mild diffuse disease LAD: Very large caliber vessel, occluded at the proximal end.  Gives origin to high D1 which is large.  Successful PTCA and stenting of the proximal LAD overlapping D1, 3.5 x 24 mm Synergy DES deployed at 12 atmospheric pressure for 60 S. 100% to 0% with TIMI 0 to TIMI-3 flow. LCx: Large vessel, giving origin to a very large OM1 which is secondary branches.  Ostium of OM1 has 80% stenosis. RCA: Mild diffuse disease, distal RCA 15 to 20% stenosis, PDA 50% stenosis.   Recommendation: Patient will need aggressive risk factor modification, dual antiplatelet therapy with aspirin and Brilinta for a year, will be watched carefully in the intensive care unit for preshock, suspect his EDP is extremely high at the end of the procedure with excessive contrast use.  Will monitor for any contrast nephropathy as well.  Recent labs: 06/24/2022: Glucose 89, BUN/Cr 14/1.02. EGFR 83. Na/K 138/4.8. Rest of the CMP normal NT proBNP 586  04/30/2022: Glucose 89, BUN/Cr 16/0.9. EGFR 96. Na/K 139/4.7 NTproBNP 1,046  03/26/2022: Glucose 72, BUN/Cr 11/0.91. EGFR 95. Na/K 137/4.8. AlKP 148. Rest of the CMP normal H/H 15/47. MCV 86. Platelets 278 HbA1C 5.5% Chol 107, TG 88, HDL 32, LDL 58 TSH 1.9 normal NTproBNP 1,279  12/03/2022: NTproBNP 709  09/03/2021: NT-proBNP 502  10/29/2020: NT pro BNP 733     Review of  Systems  Cardiovascular:  Positive for dyspnea on exertion. Negative for chest pain, leg swelling, palpitations and syncope.  Gastrointestinal:  Negative for diarrhea.  Neurological:  Positive for light-headedness.         Vitals:   06/25/22 1049  BP: (!) 90/51  Pulse: (!) 52  Resp: 16  SpO2: 93%      Body mass index is 42.26 kg/m. Filed Weights   06/25/22 1049  Weight: (!) 303 lb  (137.4 kg)       Objective:   Physical Exam Vitals and nursing note reviewed.  Constitutional:      General: He is not in acute distress. Neck:     Vascular: No JVD.  Cardiovascular:     Rate and Rhythm: Normal rate and regular rhythm.     Pulses:          Dorsalis pedis pulses are 1+ on the left side.       Posterior tibial pulses are 0 on the right side and 1+ on the left side.     Heart sounds: Normal heart sounds. No murmur heard. Pulmonary:     Effort: Pulmonary effort is normal.     Breath sounds: Normal breath sounds. No wheezing or rales.  Musculoskeletal:     Right lower leg: No edema.     Left lower leg: No edema.     VISIT DIAGNOSES:      ICD-10-CM   1. HFrEF (heart failure with reduced ejection fraction)  I50.20     2. PAD (peripheral artery disease)  I73.9     3. Coronary artery disease of native artery of native heart with stable angina pectoris  I25.118        Assessment & Recommendations:   62 y.o. 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 ProBNP stable in 500s, lightheadedness at baseline without any syncope. Continue spironolactone to 25 mg daily on the days he does not take metoprolol succinate 12.5 mg, take spironolactone 12.5 mg on days he takes metoprolol succinate 12.5 mg.  Continue Entresto to 24-26 mg bid. SGLT2i stopped due to diarrhea.  Continue home blood pressure monitoring.  PAD: Successful intervention to b/l CIA and Rt SFA for severe caludication (02/2020) Successful intervention to Lt SFA ABI 0.8 b/l. Residual mild below the knee disease Continue Aspirin 81 mg daily, Xarelto 2.5 mg  bid  CAD: Nonobstructive CAD, patent LAD stent (08/2021) Improved anginal symptoms on Imdur 15 mg daily.   Continue Plavix 75 mg daily, Xarelto 2.5 mg bid Chol 107, TG 88, HDL 32, LDL 58 (03/2022) Continue lipitor 80 mg daily.   F/u in 3 months    Nigel Mormon, MD Pager: [phone] Office: [phone]

## 2022-06-26 ENCOUNTER — Other Ambulatory Visit: Payer: Self-pay | Admitting: Cardiology

## 2022-06-26 DIAGNOSIS — I5020 Unspecified systolic (congestive) heart failure: Secondary | ICD-10-CM

## 2022-06-27 DIAGNOSIS — I5020 Unspecified systolic (congestive) heart failure: Secondary | ICD-10-CM | POA: Diagnosis not present

## 2022-07-16 NOTE — Progress Notes (Signed)
Averages January 2024 data Average Systolic BP Level 108 mmHg Lowest Systolic BP Level 80 mmHg Highest Systolic BP Level 132 mmHg  Feb 2024 Systolic Blood Pressure 106.3 (93.0 - 122.0) Diastolic Blood Pressure 68.1 (16.1 - 90.0) Heart Rate  49.7 (41.0 - 58.0)  Mar 2024 Systolic Blood Pressure 104.4 (87.0 - 122.0) Diastolic Blood Pressure 66.4 (09.6 - 90.0) Heart Rate  52.2 (44.0 - 62.0)  April 2024 Average BP 102/63 Average HR 57  Date Systolic Diastolic Units Heart Rate Units 04/24/22 10:37 AM 93 71 mmHg 45 bpm 04/24/22 10:44 AM 110 76 mmHg 54 bpm 04/25/22 11:38 AM 93 62 mmHg 46 bpm 04/26/22 8:36 AM 105 66 mmHg 58 bpm 04/27/22 10:26 AM 112 69 mmHg 47 bpm 04/28/22 8:03 AM 109 84 mmHg 46 bpm 04/29/22 12:18 PM 105 56 mmHg 56 bpm 04/30/22 8:24 AM 96 69 mmHg 52 bpm 05/01/22 10:14 AM 103 68 mmHg 52 bpm 05/02/22 3:01 PM 105 60 mmHg 52 bpm 05/03/22 12:25 PM 105 66 mmHg 42 bpm 05/04/22 3:43 PM 120 73 mmHg 52 bpm 05/05/22 10:37 AM 105 61 mmHg 49 bpm 05/05/22 10:40 AM 105 64 mmHg 41 bpm 05/06/22 10:16 PM 122 80 mmHg 41 bpm 05/07/22 1:28 PM 117 61 mmHg 57 bpm 05/08/22 2:08 PM 119 90 mmHg 50 bpm 05/09/22 2:26 PM 103 66 mmHg 43 bpm 05/10/22 3:18 PM 110 75 mmHg 52 bpm 05/11/22 12:46 PM 107 73 mmHg 56 bpm 05/12/22 10:56 AM 109 69 mmHg 43 bpm 05/13/22 12:57 PM 93 59 mmHg 47 bpm 05/13/22 12:59 PM 97 61 mmHg 45 bpm 05/13/22 1:08 PM 95 58 mmHg 45 bpm 05/14/22 3:04 PM 109 67 mmHg 57 bpm 05/15/22 4:17 PM 105 63 mmHg 58 bpm 05/15/22 4:19 PM 104 63 mmHg 56 bpm 05/16/22 2:55 PM 117 76 mmHg 48 bpm 05/17/22 7:15 PM 109 64 mmHg 49 bpm 05/18/22 1:36 PM 108 72 mmHg 52 bpm 05/19/22 2:53 PM 106 67 mmHg 58 bpm 05/20/22 10:11 AM 106 55 mmHg 60 bpm 05/20/22 12:50 PM 98 61 mmHg 56 bpm 05/21/22 2:01 PM 106 75 mmHg 59 bpm 05/22/22 1:17 PM 118 82 mmHg 57 bpm 05/23/22 3:34 PM 88 54 mmHg 58 bpm 05/23/22 3:35 PM 87 53 mmHg 57 bpm 05/23/22 5:10 PM 101 65 mmHg 55 bpm 05/24/22 11:49 AM 110 61 mmHg 44 bpm 05/25/22 10:22 AM 104 63 mmHg 56 bpm 05/26/22 12:07  PM 100 62 mmHg 50 bpm 05/27/22 8:14 AM 98 63 mmHg 55 bpm 05/29/22 9:12 AM 114 65 mmHg 56 bpm 05/30/22 12:37 PM 110 65 mmHg 58 bpm 06/01/22 1:39 PM 100 64 mmHg 56 bpm 06/02/22 11:20 AM 93 59 mmHg 46 bpm 06/03/22 12:40 PM 101 64 mmHg 52 bpm 06/04/22 3:47 PM 102 61 mmHg 52 bpm 06/05/22 11:51 AM 108 70 mmHg 57 bpm 06/06/22 2:41 PM 106 71 mmHg 46 bpm 06/07/22 3:17 PM 90 59 mmHg 61 bpm 06/08/22 8:38 AM 93 63 mmHg 51 bpm 06/08/22 11:28 AM 102 69 mmHg 47 bpm 06/09/22 2:13 PM 98 59 mmHg 56 bpm 06/09/22 2:15 PM 96 57 mmHg 59 bpm 06/10/22 6:50 AM 107 81 mmHg 59 bpm 06/10/22 12:42 PM 105 66 mmHg 54 bpm 06/11/22 9:32 AM 108 66 mmHg 56 bpm 06/12/22 9:39 AM 107 67 mmHg 50 bpm 06/13/22 3:04 PM 96 61 mmHg 54 bpm 06/14/22 10:17 AM 114 78 mmHg 51 bpm 06/15/22 2:09 PM 111 70 mmHg 62 bpm 06/16/22 12:18 PM 111 53 mmHg 49 bpm 06/17/22 1:09 PM 88 58 mmHg 57 bpm  06/17/22 1:11 PM 122 70 mmHg 56 bpm 06/18/22 12:09 PM 107 73 mmHg 61 bpm 06/19/22 9:43 AM 92 64 mmHg 50 bpm 06/19/22 9:44 AM 93 59 mmHg 47 bpm 06/19/22 10:29 AM 106 55 mmHg 52 bpm 06/20/22 1:12 PM 102 70 mmHg 56 bpm 06/21/22 11:51 AM 98 59 mmHg 52 bpm 06/22/22 4:12 PM 92 52 mmHg 54 bpm 06/22/22 4:13 PM 93 59 mmHg 50 bpm 06/23/22 12:37 PM 110 78 mmHg 54 bpm 06/24/22 9:57 AM 92 41 mmHg 51 bpm 06/24/22 10:13 AM 98 72 mmHg 53 bpm 06/24/22 11:53 AM 100 51 mmHg 54 bpm 06/24/22 11:54 AM 93 61 mmHg 48 bpm 06/25/22 7:02 AM 113 72 mmHg 58 bpm 06/26/22 11:42 AM 106 70 mmHg 73 bpm 06/27/22 2:25 PM 101 56 mmHg 54 bpm 06/28/22 3:42 PM 106 70 mmHg 61 bpm 06/29/22 10:28 AM 86 49 mmHg 66 bpm 06/29/22 10:29 AM 94 58 mmHg 56 bpm 06/29/22 12:51 PM 94 56 mmHg 56 bpm 06/29/22 10:12 PM 111 69 mmHg 82 bpm 06/30/22 1:01 PM 106 65 mmHg 61 bpm 07/01/22 9:01 AM 113 74 mmHg 64 bpm 07/02/22 3:45 PM 104 68 mmHg 56 bpm 07/03/22 9:43 AM 116 68 mmHg 56 bpm 07/04/22 4:10 PM 98 60 mmHg 69 bpm 07/05/22 2:53 PM 105 63 mmHg 60 bpm 07/07/22 10:49 AM 108 69 mmHg 60 bpm 07/08/22 11:35 AM 102 74 mmHg 55 bpm 07/09/22 10:16  AM 103 65 mmHg 57 bpm 07/10/22 11:40 AM 111 65 mmHg 58 bpm 07/11/22 2:33 PM 118 76 mmHg 68 bpm 07/12/22 3:16 PM 94 66 mmHg 59 bpm 07/12/22 3:17 PM 104 64 mmHg 50 bpm 07/13/22 10:30 AM 104 56 mmHg 55 bpm 07/14/22 11:26 AM 94 60 mmHg 46 bpm 07/14/22 11:27 AM 94 58 mmHg 48 bpm 07/15/22 4:50 PM 97 58 mmHg 54 bpm 07/16/22 1:33 PM 100 57 mmHg 47 bpm

## 2022-07-27 DIAGNOSIS — I5020 Unspecified systolic (congestive) heart failure: Secondary | ICD-10-CM | POA: Diagnosis not present

## 2022-08-02 IMAGING — MR MR CARD MORPHOLOGY WO/W CM
45 of 48 series · 45 of 48 positions shown · IV contrast (Contrast agent)
Comparison: none

CLINICAL DATA: Ischemic cardiomyopathy evaluation

EXAM:
CARDIAC MRI
TECHNIQUE: The patient was scanned on a 1.5 Tesla Siemens magnet. A dedicated
cardiac coil was used. Functional imaging was done using [employer]
sequences. [DATE], and 4 chamber views were done to assess for RWMA's.
Modified Pozisa rule using a short axis stack was used to
calculate an ejection fraction on a dedicated work station using
[employer] software. The patient received 10 cc of Gadavist. After 10
minutes inversion recovery sequences were used to assess for
infiltration and scar tissue.
CONTRAST:  10 cc  of Gadavist

[Series 4: t2_haste_db_tra_bh · axial · 8.0mm · 1.56mm/px · 1 of 20 slices shown]
[im 1/20]
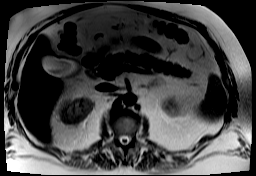

[Series 8: bSSFP · oblique · 8.0mm · 1.61mm/px · 1 of 25 slices shown (1 of 23)]
[im 1/25]
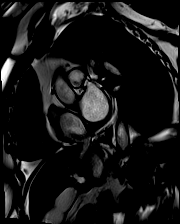

[Series 9: bSSFP · oblique · 8.0mm · 1.61mm/px · 1 of 25 slices shown (2 of 23)]
[im 1/25]
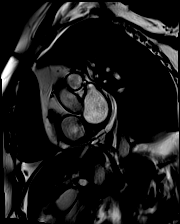

[Series 10: bSSFP · oblique · 8.0mm · 1.61mm/px · 1 of 25 slices shown (3 of 23)]
[im 1/25]
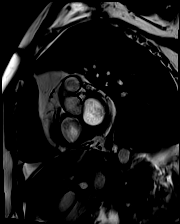

[Series 11: bSSFP · oblique · 8.0mm · 1.61mm/px · 1 of 25 slices shown (4 of 23)]
[im 1/25]
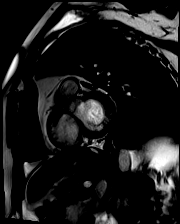

[Series 12: bSSFP · oblique · 8.0mm · 1.61mm/px · 1 of 25 slices shown (5 of 23)]
[im 1/25]
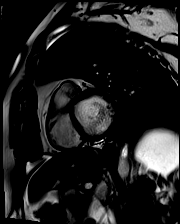

[Series 13: bSSFP · oblique · 8.0mm · 1.61mm/px · 1 of 25 slices shown (6 of 23)]
[im 1/25]
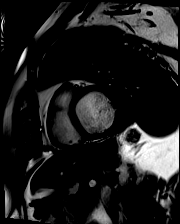

[Series 14: bSSFP · oblique · 8.0mm · 1.61mm/px · 1 of 25 slices shown (7 of 23)]
[im 1/25]
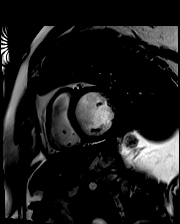

[Series 15: bSSFP · oblique · 8.0mm · 1.61mm/px · 1 of 25 slices shown (8 of 23)]
[im 1/25]
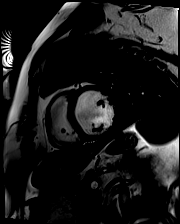

[Series 16: bSSFP · oblique · 8.0mm · 1.61mm/px · 1 of 25 slices shown (9 of 23)]
[im 1/25]
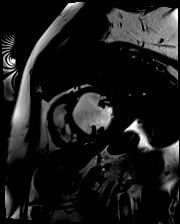

[Series 17: bSSFP · oblique · 8.0mm · 1.61mm/px · 1 of 25 slices shown (10 of 23)]
[im 1/25]
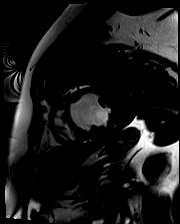

[Series 18: bSSFP · oblique · 8.0mm · 1.61mm/px · 1 of 25 slices shown (11 of 23)]
[im 1/25]
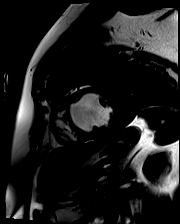

[Series 19: bSSFP · oblique · 8.0mm · 1.61mm/px · 1 of 25 slices shown (12 of 23)]
[im 1/25]
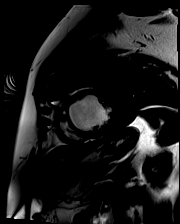

[Series 20: bSSFP · oblique · 8.0mm · 1.61mm/px · 1 of 25 slices shown (13 of 23)]
[im 1/25]
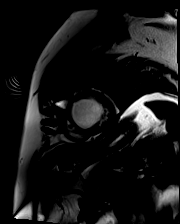

[Series 21: bSSFP · oblique · 8.0mm · 1.61mm/px · 1 of 25 slices shown (14 of 23)]
[im 1/25]
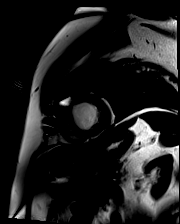

[Series 22: bSSFP · oblique · 8.0mm · 1.61mm/px · 1 of 25 slices shown (15 of 23)]
[im 1/25]
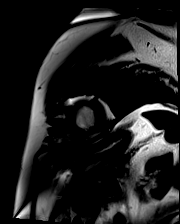

[Series 23: bSSFP · oblique · 8.0mm · 1.65mm/px · 1 of 25 slices shown (16 of 23)]
[im 1/25]
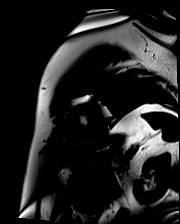

[Series 24: bSSFP · oblique · 8.0mm · 1.65mm/px · 1 of 25 slices shown (17 of 23)]
[im 1/25]
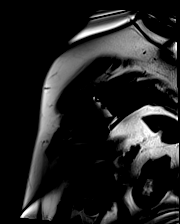

[Series 25: bSSFP · oblique · 8.0mm · 1.65mm/px · 1 of 25 slices shown (18 of 23)]
[im 1/25]
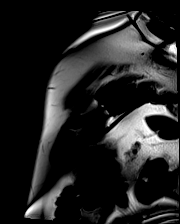

[Series 26: bSSFP · oblique · 8.0mm · 1.65mm/px · 1 of 25 slices shown (19 of 23)]
[im 1/25]
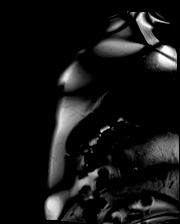

[Series 27: (id)_long_t1 · oblique · 8.0mm · 1.60mm/px · 1 of 24 slices shown]
[im 1/24]
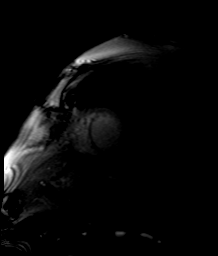

[Series 28: (id)_long_t1_moco · oblique · 8.0mm · 1.60mm/px · 1 of 24 slices shown]
[im 1/24]
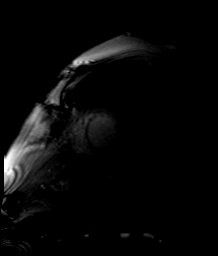

[Series 29: (id)_long_t1_moco_t1 · oblique · 8.0mm · 1.60mm/px · 1 of 6 slices shown]
[im 1/6]
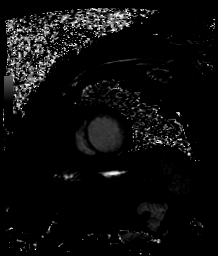

[Series 31: (id)_trufi · oblique · 8.0mm · 2.14mm/px · 1 of 9 slices shown]
[im 1/9]
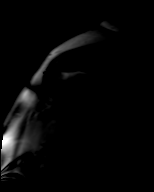

[Series 32: (id)_trufi_moco · oblique · 8.0mm · 2.14mm/px · 1 of 9 slices shown]
[im 1/9]
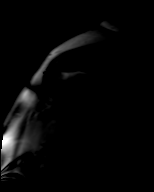

[Series 33: (id)_trufi_moco_t2 · oblique · 8.0mm · 2.14mm/px · 1 of 3 slices shown]
[im 1/3]
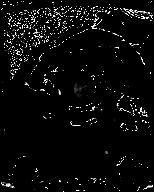

[Series 35: bSSFP · oblique · 6.0mm · 1.60mm/px · 1 of 25 slices shown (20 of 23)]
[im 1/25]
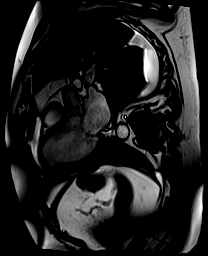

[Series 36: bSSFP · sagittal · 6.0mm · 1.41mm/px · 1 of 25 slices shown (21 of 23)]
[im 1/25]
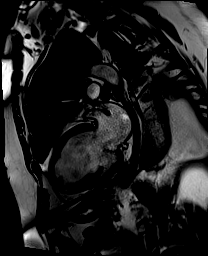

[Series 38: bSSFP · axial · 6.0mm · 1.60mm/px · 1 of 25 slices shown (22 of 23)]
[im 1/25]
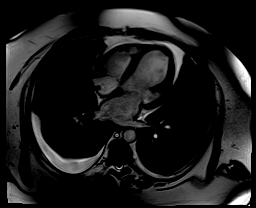

[Series 39: bSSFP · coronal · 6.0mm · 1.56mm/px · 1 of 25 slices shown (23 of 23)]
[im 1/25]
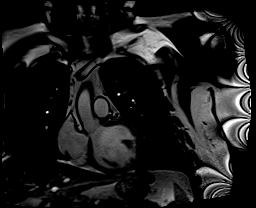

[Series 41: cine rvit · sagittal · 6.0mm · 1.60mm/px · 1 of 25 slices shown]
[im 1/25]
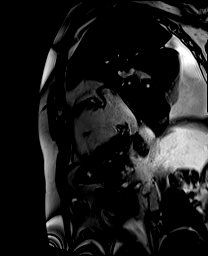

[Series 43: aortic valve cine · axial · 6.0mm · 1.41mm/px · 1 of 25 slices shown]
[im 1/25]
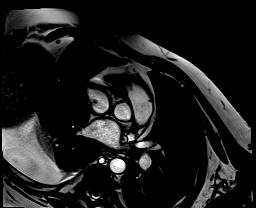

[Series 44: aortic valve flow_200_tp_retro_bh · axial · 6.0mm · 1.73mm/px · 1 of 30 slices shown]
[im 1/30]
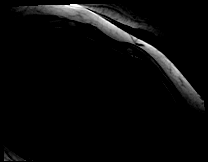

[Series 45: aortic valve flow_200_tp_retro_bh_mag · axial · 6.0mm · 1.73mm/px · 1 of 30 slices shown]
[im 1/30]
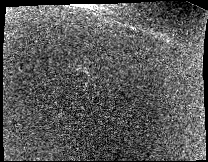

[Series 46: aortic valve flow_200_tp_retro_bh_p · axial · 6.0mm · 1.73mm/px · 1 of 30 slices shown]
[im 1/30]
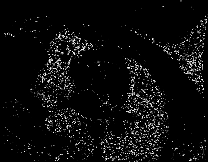

[Series 48: lge_single shot sa · oblique · 8.0mm · 2.08mm/px · 1 of 17 slices shown (1 of 2)]
[im 1/17]
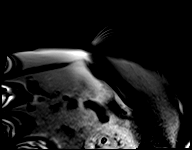

[Series 49: lge_single shot sa · oblique · 8.0mm · 2.08mm/px · 1 of 17 slices shown (2 of 2)]
[im 1/17]
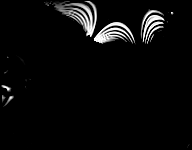

[Series 52: lge_single shot 4 · axial · 6.0mm · 2.14mm/px · 1 of 1 slices shown (1 of 2)]
[im 1/1]
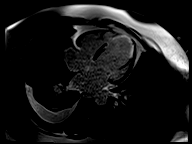

[Series 53: lge_single shot 4 · axial · 6.0mm · 2.14mm/px · 1 of 1 slices shown (2 of 2)]
[im 1/1]
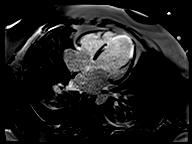

[Series 56: (id)_short_t1 · oblique · 8.0mm · 1.56mm/px · 1 of 27 slices shown]
[im 1/27]
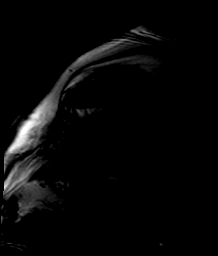

[Series 57: (id)_short_t1_moco · oblique · 8.0mm · 1.56mm/px · 1 of 27 slices shown]
[im 1/27]
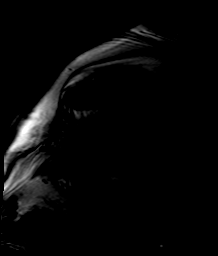

[Series 58: (id)_short_t1_moco_t1 · oblique · 8.0mm · 1.56mm/px · 1 of 6 slices shown]
[im 1/6]
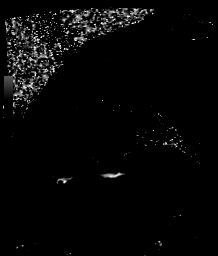

[Series 61: lge short axis · oblique · 8.0mm · 1.71mm/px · 1 of 15 slices shown (1 of 2)]
[im 1/15]
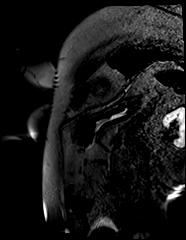

[Series 62: lge short axis · oblique · 8.0mm · 1.71mm/px · 1 of 15 slices shown (2 of 2)]
[im 1/15]
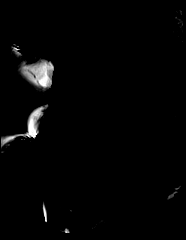

[Series 67: lge 4 ch · axial · 8.0mm · 1.67mm/px · 1 of 1 slices shown]
[im 1/1]
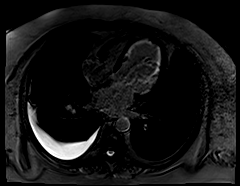

[45 of 48 positions shown; findings below may reference images not displayed]

FINDINGS: Left ventricle:

-Mild dilatation

-Moderate systolic dysfunction. Akinesis of mid to apical anterior,
mid anteroseptal, apical septal, apical inferior, and apex

-Nonspecific elevation in ECV (30%)

-Subendocardial LGE consistent with prior infarct in mid anterior,
mid anteroseptal, apical anterior, apical septal, apical inferior,
apex. >50% transmural LGE

LV EF: 36% (Normal 56-78%)

Absolute volumes:

LV EDV: 275mL (Normal 77-195 mL)

LV ESV: 176mL (Normal 19-72 mL)

LV SV: 99mL (Normal 51-133 mL)

CO: 4.1L/min (Normal 2.8-8.8 L/min)

Indexed volumes:

LV EDV: 110mL/sq-m (Normal 47-92 mL/sq-m)

LV ESV: 71mL/sq-m (Normal 13-30 mL/sq-m)

LV SV: 40mL/sq-m (Normal 32-62 mL/sq-m)

CI: 1.6L/min/sq-m (Normal 1.7-4.2 L/min/sq-m)

Right ventricle: Normal size and systolic function

RV EF:  54% (Normal 47-74%)

Absolute volumes:

RV EDV: 155mL (Normal 88-227 mL)

RV ESV: 71mL (Normal 23-103 mL)

RV SV: 84mL (Normal 52-138 mL)

CO: 3.5L/min (Normal 2.8-8.8 L/min)

Indexed volumes:

RV EDV: 62mL/sq-m (Normal 55-105 mL/sq-m)

RV ESV: 28mL/sq-m (Normal 15-43 mL/sq-m)

RV SV: 34mL/sq-m (Normal 32-64 mL/sq-m)

CI: 1.4L/min/sq-m (Normal 1.7-4.2 L/min/sq-m)

Left atrium: Mild enlargement

Right atrium: Normal size

Mitral valve: Moderate regurgitation (regurgitant fraction 24%)

Aortic valve: No regurgitation

Tricuspid valve: No regurgitation

Pulmonic valve: No regurgitation

Aorta: Normal proximal ascending aorta

Coronary arteries: Normal origins

Pericardium: Normal

Extracardiac structures: Moderate right pleural effusion
IMPRESSION: 1. Subendocardial late gadolinium enhancement consistent with prior
infarct in LAD territory. There is >50% transmural LGE suggesting
nonviability in mid anterior/anteroseptal walls, apical
anterior/septal/inferior walls, and apex.

2. Mild LV dilatation with moderate systolic function (EF 36%).
Akinesis of mid to apical anterior, mid anteroseptal, apical septal,
apical inferior, and apex.

3.  Normal RV size and systolic function (EF 54%)

4.  Moderate mitral regurgitation (regurgitant fraction 24%)

5.  Moderate right pleural effusion

## 2022-08-26 ENCOUNTER — Other Ambulatory Visit: Payer: Self-pay | Admitting: Cardiology

## 2022-08-26 DIAGNOSIS — I5020 Unspecified systolic (congestive) heart failure: Secondary | ICD-10-CM | POA: Diagnosis not present

## 2022-09-21 ENCOUNTER — Other Ambulatory Visit: Payer: Self-pay | Admitting: Cardiology

## 2022-09-21 DIAGNOSIS — I5020 Unspecified systolic (congestive) heart failure: Secondary | ICD-10-CM

## 2022-09-28 ENCOUNTER — Ambulatory Visit: Payer: Medicare HMO | Admitting: Cardiology

## 2022-09-28 ENCOUNTER — Encounter: Payer: Self-pay | Admitting: Cardiology

## 2022-09-28 VITALS — BP 97/62 | HR 49 | Resp 16 | Ht 71.0 in | Wt 299.0 lb

## 2022-09-28 DIAGNOSIS — I498 Other specified cardiac arrhythmias: Secondary | ICD-10-CM | POA: Diagnosis not present

## 2022-09-28 DIAGNOSIS — I739 Peripheral vascular disease, unspecified: Secondary | ICD-10-CM

## 2022-09-28 DIAGNOSIS — I5020 Unspecified systolic (congestive) heart failure: Secondary | ICD-10-CM | POA: Diagnosis not present

## 2022-09-28 DIAGNOSIS — I25118 Atherosclerotic heart disease of native coronary artery with other forms of angina pectoris: Secondary | ICD-10-CM

## 2022-09-28 NOTE — Progress Notes (Signed)
Patient referred by de Peru, Buren Kos, MD for CAD  Subjective:   Jonathon Cain, male    DOB: 1960-07-08, 62 y.o.   MRN: [id number]   Chief Complaint  Patient presents with   heart failure with reduced ejection fraction   Follow-up     HPI   62 y.o. 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  Few days ago, he had swelling in his right leg, which has since resolved.  He has constant chest tightness that states throughout the day, no worse with physical exertion, no different from before.  He has unchanged lightheadedness, without any syncope episodes.   Current Outpatient Medications:    aspirin EC 81 MG tablet, Take 1 tablet (81 mg total) by mouth daily. Swallow whole., Disp: 90 tablet, Rfl: 3   atorvastatin (LIPITOR) 80 MG tablet, TAKE ONE TABLET BY MOUTH EVERYDAY AT BEDTIME, Disp: 103 tablet, Rfl: 3   cetirizine (ZYRTEC) 10 MG tablet, Take 10 mg by mouth daily as needed for allergies., Disp: , Rfl:    ENTRESTO 24-26 MG, TAKE ONE TABLET BY MOUTH TWICE DAILY, Disp: 60 tablet, Rfl: 5   fluticasone (FLONASE) 50 MCG/ACT nasal spray, Place 2 sprays into both nostrils daily as needed for allergies or rhinitis. , Disp: , Rfl:    furosemide (LASIX) 40 MG tablet, TAKE ONE TABLET BY MOUTH ONCE daily AS NEEDED ONLY IF you have more THAN usual LEG swelling, Disp: 30 tablet, Rfl: 2   isosorbide mononitrate (IMDUR) 30 MG 24 hr tablet, Take 0.5 tablets (15 mg total) by mouth daily., Disp: 30 tablet, Rfl: 3   metoprolol succinate (TOPROL-XL) 25 MG 24 hr tablet, TAKE 1/2 TABLET BY MOUTH Every Other Day at Bedtime, Disp: 45 tablet, Rfl: 2   rivaroxaban (XARELTO) 2.5 MG TABS tablet, TAKE ONE TABLET BY MOUTH EVERY MORNING and TAKE ONE TABLET BY MOUTH EVERY EVENING, Disp: 180 tablet, Rfl: 3    spironolactone (ALDACTONE) 25 MG tablet, Take 1 tablet (25 mg total) by mouth as directed. 12.5 and 25 every other day, Disp: 90 tablet, Rfl: 3   tamsulosin (FLOMAX) 0.4 MG CAPS capsule, Take 1 capsule (0.4 mg total) by mouth daily as needed (urine flow)., Disp: 90 capsule, Rfl: 1    Cardiovascular and other pertinent studies:  EKG 04/02/2022: Sinus rhythm 65 bpm First degree A-V block Frequent pvcs -ventricular trigeminy  Left axis -anterior fascicular block Nonspecific T-abnormality  RHC/LHC 09/16/2021: LM: Normal LAD: Patent prox LAD stent with 40% late lumen loss         RFR 0.94 (physiologically non-significant) Lcx: OM1 ostial 50% stenosis (Improved from previous cath in 2021-80% with negative RFR then) RCA: Distal 20%, ostial PDA 50% stenoses   LV: 121/11 mmHg LVEDP: 23 mmHg   RA: 10 mmHg RV: 78/4 mmHg PA: 72/23 mmHg, mPAP 40 mmHg PCW: 24 mmHg   CO: 6.0 L/min CI: 2.4 L/min/m2   No indication for revascularization. Continue optimization of GDMT for HFrEF  Echocardiogram 08/21/2021:  Left ventricle cavity is normal in size and wall thickness. Severe global  hypokinesis with akinetic apex. LVEF 25-30%. Doppler evidence of grade III  (restrictive) diastolic dysfunction, elevated LAP.  Mild to moderate mitral regurgitation.  Normal right atrial pressure.  Compared to previous study on 09/18/2020, LVEF has further reduced from  around 35%.   ABI 08/21/2021:  This exam reveals normal perfusion of the right lower extremity (ABI  1.08).    This exam reveals normal perfusion of the left lower extremity (ABI 1.01).   Compared to the study done on 04/17/2020, left ABI has improved from 0.75.    No change in the right.  This represents successful revascularization of  the bilateral SFA by history.  Markedly abnormal monophasic waveform pattern in bilateral posterior  tibial artery.  Lexiscan Tetrofosmin stress test 09/18/2020: Lexiscan nuclear stress test performed using  1-day protocol. Patient experienced dyspnea and chest discomfort SPECT images showed large sized, moderate intensity, fixed perfusion defect in mid to apical, anterior, inferior, and apical myocardium. There is severe decrease in myocardial thickening and wall motion. Stress LVEF 12%. High risk study.  Cardiac MRI 02/01/2020: 1. Subendocardial late gadolinium enhancement consistent with prior infarct in LAD territory. There is >50% transmural LGE suggesting nonviability in mid anterior/anteroseptal walls, apical anterior/septal/inferior walls, and apex. 2. Mild LV dilatation with moderate systolic function (EF 36%). Akinesis of mid to apical anterior, mid anteroseptal, apical septal, apical inferior, and apex. 3.  Normal RV size and systolic function (EF 54%) 4.  Moderate mitral regurgitation (regurgitant fraction 24%) 5.  Moderate right pleural effusion   Coronary angiography 11/14/2019: LM: Diffuse 30% disease. IVUS MLA 7.3 mm2,. dFR 1.0 LAD: Patent prox LAD stent with udner expansion        Optimization with 3.5X15 mm Eden balloon at 22 atm        Excellent expansion as seen on IVUS        TIMI flow II-->III        Diag: Mod diffuse disease LCx: 80% ostial OM1 stenosis. dFR 0.98 RCA: Not engaged today  Left Heart Catheterization 07/26/2019 (Dr. Jacinto Halim):  LM: Mild diffuse disease LAD: Very large caliber vessel, occluded at the proximal end.  Gives origin to high D1 which is large.  Successful PTCA and stenting of the proximal LAD overlapping D1, 3.5 x 24 mm Synergy DES deployed at 12 atmospheric pressure for 60 S. 100% to 0% with TIMI 0 to TIMI-3 flow. LCx: Large vessel, giving origin to a very large OM1 which is secondary branches.  Ostium of OM1 has 80% stenosis. RCA: Mild diffuse disease, distal RCA 15 to 20% stenosis, PDA 50% stenosis.   Recommendation: Patient will need aggressive risk factor modification, dual antiplatelet therapy with aspirin and Brilinta for a year, will be  watched carefully in the intensive care unit for preshock, suspect his EDP is extremely high at the end of the procedure with excessive contrast use.  Will monitor for any contrast nephropathy as well.  Recent labs: 06/24/2022: Glucose 89, BUN/Cr 14/1.02. EGFR 83. Na/K 138/4.8. Rest of the CMP normal NT proBNP 586  04/30/2022: Glucose 89, BUN/Cr 16/0.9. EGFR 96. Na/K 139/4.7 NTproBNP 1,046  03/26/2022: Glucose 72, BUN/Cr 11/0.91. EGFR 95. Na/K 137/4.8. AlKP 148. Rest of the CMP normal H/H 15/47. MCV 86. Platelets 278 HbA1C 5.5% Chol 107, TG 88, HDL 32, LDL 58 TSH 1.9 normal NTproBNP 1,279  12/03/2022: NTproBNP 709  09/03/2021: NT-proBNP 502  10/29/2020: NT pro BNP 733     Review of Systems  Cardiovascular:  Positive for dyspnea on exertion. Negative for chest pain, leg swelling, palpitations and syncope.  Gastrointestinal:  Negative for diarrhea.  Neurological:  Positive for light-headedness.         Vitals:   09/28/22 1019  BP: 97/62  Pulse: (!) 49  Resp: 16  SpO2: 96%  Body mass index is 41.7 kg/m. Filed Weights   09/28/22 1019  Weight: 299 lb (135.6 kg)       Objective:   Physical Exam Vitals and nursing note reviewed.  Constitutional:      General: He is not in acute distress. Neck:     Vascular: No JVD.  Cardiovascular:     Rate and Rhythm: Normal rate and regular rhythm.     Pulses:          Dorsalis pedis pulses are 1+ on the left side.       Posterior tibial pulses are 0 on the right side and 1+ on the left side.     Heart sounds: Normal heart sounds. No murmur heard. Pulmonary:     Effort: Pulmonary effort is normal.     Breath sounds: Normal breath sounds. No wheezing or rales.  Musculoskeletal:     Right lower leg: No edema.     Left lower leg: No edema.     VISIT DIAGNOSES:      ICD-10-CM   1. HFrEF (heart failure with reduced ejection fraction) (HCC)  I50.20 EKG 12-Lead    PCV ECHOCARDIOGRAM COMPLETE    LONG TERM MONITOR  (3-14 DAYS)    2. PAD (peripheral artery disease) (HCC)  I73.9     3. Coronary artery disease of native artery of native heart with stable angina pectoris (HCC)  I25.118     4. Junctional rhythm  I49.8 LONG TERM MONITOR (3-14 DAYS)       Assessment & Recommendations:   62 y.o. 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 Cath 08/2021 showed patent LAD stent, moderate circumflex/RCA disease with negative FFR Continue aspirin, statin.  Ischemic cardiomyopathy: EF 25-30% (echocardiogram 08/2021) Nonobstructive CAD, patent LAD stent (08/2021) NYHA class II symptoms. Continue spironolactone to 12.5-25 mg on alternate days.   Continue Entresto to 24-26 mg bid. SGLT2i stopped due to diarrhea.  Stopping metoprolol due to junctional rhythm. Will check echocardiogram. If EF remains <35%, will refer for ICD placement.  Junctional rhythm: New finding.  No change in baseline lightheadedness, no syncope. Check 2-week cardiac telemetry.  PAD: Successful intervention to b/l CIA and Rt SFA for severe caludication (02/2020) Successful intervention to Lt SFA ABI 0.8 b/l. Residual mild below the knee disease Continue Aspirin 81 mg daily, Xarelto 2.5 mg bid  F/u in 6-8 weeks     Elder Negus, MD Pager: [phone] Office: [phone]

## 2022-10-05 ENCOUNTER — Encounter (HOSPITAL_BASED_OUTPATIENT_CLINIC_OR_DEPARTMENT_OTHER): Payer: Self-pay | Admitting: *Deleted

## 2022-10-05 ENCOUNTER — Telehealth (HOSPITAL_BASED_OUTPATIENT_CLINIC_OR_DEPARTMENT_OTHER): Payer: Self-pay | Admitting: *Deleted

## 2022-10-05 NOTE — Telephone Encounter (Signed)
Called as records show colon cancer screening done in 2020 but no record in chart. Pt is not sure when last colonoscopy was and is not sure when last cologuard was. This would have been done through [facility] last so records have been requested from [facility]

## 2022-10-07 ENCOUNTER — Encounter (HOSPITAL_BASED_OUTPATIENT_CLINIC_OR_DEPARTMENT_OTHER): Payer: Self-pay | Admitting: Family Medicine

## 2022-10-12 ENCOUNTER — Encounter (HOSPITAL_BASED_OUTPATIENT_CLINIC_OR_DEPARTMENT_OTHER): Payer: Self-pay | Admitting: Family Medicine

## 2022-10-12 ENCOUNTER — Ambulatory Visit (INDEPENDENT_AMBULATORY_CARE_PROVIDER_SITE_OTHER): Payer: Medicare HMO | Admitting: Family Medicine

## 2022-10-12 VITALS — BP 121/71 | HR 55 | Temp 97.7°F | Ht 71.0 in | Wt 295.0 lb

## 2022-10-12 DIAGNOSIS — Z1283 Encounter for screening for malignant neoplasm of skin: Secondary | ICD-10-CM | POA: Diagnosis not present

## 2022-10-12 DIAGNOSIS — Z0000 Encounter for general adult medical examination without abnormal findings: Secondary | ICD-10-CM

## 2022-10-12 DIAGNOSIS — I5020 Unspecified systolic (congestive) heart failure: Secondary | ICD-10-CM | POA: Diagnosis not present

## 2022-10-12 DIAGNOSIS — R3912 Poor urinary stream: Secondary | ICD-10-CM | POA: Diagnosis not present

## 2022-10-12 NOTE — Assessment & Plan Note (Signed)
Patient indicates ongoing difficulties with slow urinary stream.  He has been utilizing Flomax, however does not feel that this has been providing adequate improvement in symptoms. Discussed options, patient interested in referral to urology for further evaluation and recommendations, referral placed today

## 2022-10-12 NOTE — Patient Instructions (Signed)
  Medication Instructions:  Your physician recommends that you continue on your current medications as directed. Please refer to the Current Medication list given to you today. --If you need a refill on any your medications before your next appointment, please call your pharmacy first. If no refills are authorized on file call the office.-- Lab Work: Your physician has recommended that you have lab work today: No If you have labs (blood work) drawn today and your tests are completely normal, you will receive your results via MyChart message OR a phone call from our staff.  Please ensure you check your voicemail in the event that you authorized detailed messages to be left on a delegated number. If you have any lab test that is abnormal or we need to change your treatment, we will call you to review the results.  Referrals/Procedures/Imaging: Yes  Follow-Up: Your next appointment:   Your physician recommends that you schedule a follow-up appointment in: 6 months with Dr. de Cuba.  You will receive a text message or e-mail with a link to a survey about your care and experience with us today! We would greatly appreciate your feedback!   Thanks for letting us be apart of your health journey!!  Primary Care and Sports Medicine   Dr. Raymond de Cuba   We encourage you to activate your patient portal called "MyChart".  Sign up information is provided on this After Visit Summary.  MyChart is used to connect with patients for Virtual Visits (Telemedicine).  Patients are able to view lab/test results, encounter notes, upcoming appointments, etc.  Non-urgent messages can be sent to your provider as well. To learn more about what you can do with MyChart, please visit --  [email or web address].    

## 2022-10-12 NOTE — Progress Notes (Signed)
    Procedures performed today:    None.  Independent interpretation of notes and tests performed by another provider:   None.  Brief History, Exam, Impression, and Recommendations:    BP 121/71 (BP Location: Right Arm, Patient Position: Sitting, Cuff Size: Large)   Pulse (!) 55   Temp 97.7 F (36.5 C)   Ht 5\' 11"  (1.803 m)   Wt 295 lb (133.8 kg)   SpO2 98%   BMI 41.14 kg/m   Skin cancer screening -     Ambulatory referral to Dermatology  Poor urinary stream Assessment & Plan: Patient indicates ongoing difficulties with slow urinary stream.  He has been utilizing Flomax, however does not feel that this has been providing adequate improvement in symptoms. Discussed options, patient interested in referral to urology for further evaluation and recommendations, referral placed today  Orders: -     Ambulatory referral to Urology  HFrEF (heart failure with reduced ejection fraction) (HCC) Assessment & Plan: Continues with medications as prescribed by cardiology.  Not currently taking beta-blocker, partly related to symptoms of fatigue, low heart rate and blood pressure.  He continues to have some intermittent fatigue.  Notes that when he is able to be somewhat active, he does have improvement in symptoms. Feel that it is likely that ongoing fatigue is related to underlying cardiac etiology.  Recommend continued close follow-up with cardiology, continue with medications as prescribed.  Would recommend that patient continue with activities as tolerated, this is limited partly due to vision issues/patient being legally blind.   Wellness examination -     CBC with Differential/Platelet; Future -     Comprehensive metabolic panel; Future -     Hemoglobin A1c; Future -     Lipid panel; Future -     TSH Rfx on Abnormal to Free T4; Future  He does report having intermittent dry eyes.  He has not discussed with his ophthalmologist, however does have upcoming appointment in the next few  weeks and plans to discuss with him.  Encouraged patient to discuss with eye specialist to determine if any further evaluation or treatment would be needed.  Requesting referral to dermatologist to establish with one locally, referral placed today Return in about 6 months (around 04/14/2023) for CPE with fasting labs 1 week prior.   ___________________________________________ Macrae Wiegman de [place], MD, ABFM, CAQSM Primary Care and Sports Medicine [facility]

## 2022-10-12 NOTE — Assessment & Plan Note (Signed)
Continues with medications as prescribed by cardiology.  Not currently taking beta-blocker, partly related to symptoms of fatigue, low heart rate and blood pressure.  He continues to have some intermittent fatigue.  Notes that when he is able to be somewhat active, he does have improvement in symptoms. Feel that it is likely that ongoing fatigue is related to underlying cardiac etiology.  Recommend continued close follow-up with cardiology, continue with medications as prescribed.  Would recommend that patient continue with activities as tolerated, this is limited partly due to vision issues/patient being legally blind.

## 2022-10-16 ENCOUNTER — Other Ambulatory Visit: Payer: Medicare HMO

## 2022-10-16 DIAGNOSIS — I498 Other specified cardiac arrhythmias: Secondary | ICD-10-CM

## 2022-10-16 DIAGNOSIS — I5020 Unspecified systolic (congestive) heart failure: Secondary | ICD-10-CM

## 2022-10-28 DIAGNOSIS — H53453 Other localized visual field defect, bilateral: Secondary | ICD-10-CM | POA: Diagnosis not present

## 2022-10-28 DIAGNOSIS — H3552 Pigmentary retinal dystrophy: Secondary | ICD-10-CM | POA: Diagnosis not present

## 2022-10-28 DIAGNOSIS — R6889 Other general symptoms and signs: Secondary | ICD-10-CM | POA: Diagnosis not present

## 2022-10-28 DIAGNOSIS — H35373 Puckering of macula, bilateral: Secondary | ICD-10-CM | POA: Diagnosis not present

## 2022-10-28 DIAGNOSIS — H43813 Vitreous degeneration, bilateral: Secondary | ICD-10-CM | POA: Diagnosis not present

## 2022-11-03 ENCOUNTER — Ambulatory Visit: Payer: Medicare HMO

## 2022-11-03 DIAGNOSIS — I5020 Unspecified systolic (congestive) heart failure: Secondary | ICD-10-CM | POA: Diagnosis not present

## 2022-11-03 DIAGNOSIS — I498 Other specified cardiac arrhythmias: Secondary | ICD-10-CM | POA: Diagnosis not present

## 2022-11-21 ENCOUNTER — Other Ambulatory Visit: Payer: Self-pay | Admitting: Cardiology

## 2022-11-21 DIAGNOSIS — I739 Peripheral vascular disease, unspecified: Secondary | ICD-10-CM

## 2022-11-27 DIAGNOSIS — I5020 Unspecified systolic (congestive) heart failure: Secondary | ICD-10-CM | POA: Diagnosis not present

## 2022-11-27 DIAGNOSIS — I498 Other specified cardiac arrhythmias: Secondary | ICD-10-CM | POA: Diagnosis not present

## 2022-11-27 NOTE — Progress Notes (Signed)
Discussed with the patient. Will get cardiac MRI for accurate EF assessment. Please schedule f/u w/me in 3 months for HFrEF.   Elder Negus, MD Pager: [phone] Office: [phone]

## 2022-12-13 DIAGNOSIS — I498 Other specified cardiac arrhythmias: Secondary | ICD-10-CM | POA: Diagnosis not present

## 2022-12-13 DIAGNOSIS — I5020 Unspecified systolic (congestive) heart failure: Secondary | ICD-10-CM | POA: Diagnosis not present

## 2022-12-16 ENCOUNTER — Institutional Professional Consult (permissible substitution): Payer: Medicare HMO | Admitting: Plastic Surgery

## 2022-12-18 ENCOUNTER — Encounter: Payer: Medicare HMO | Admitting: Urology

## 2023-01-04 ENCOUNTER — Telehealth (HOSPITAL_COMMUNITY): Payer: Self-pay | Admitting: *Deleted

## 2023-01-04 ENCOUNTER — Other Ambulatory Visit: Payer: Self-pay | Admitting: Cardiology

## 2023-01-04 ENCOUNTER — Encounter (HOSPITAL_COMMUNITY): Payer: Self-pay

## 2023-01-04 DIAGNOSIS — I5020 Unspecified systolic (congestive) heart failure: Secondary | ICD-10-CM

## 2023-01-04 NOTE — Telephone Encounter (Signed)
Attempted to call patient regarding upcoming cardiac MRI appointment. Left message on voicemail with name and callback number  Larey Brick RN Navigator Cardiac Imaging [facility] [phone] Office [phone] Cell

## 2023-01-04 NOTE — Telephone Encounter (Signed)
Patient returning call about his upcoming cardiac imaging study; pt verbalizes understanding of appt date/time, parking situation and where to check in, and verified current allergies; name and call back number provided for further questions should they arise  Larey Brick RN Navigator Cardiac Imaging [facility] [phone] office [phone] cell  Patient states he has had this study in the past.

## 2023-01-05 ENCOUNTER — Ambulatory Visit (HOSPITAL_BASED_OUTPATIENT_CLINIC_OR_DEPARTMENT_OTHER)
Admission: RE | Admit: 2023-01-05 | Discharge: 2023-01-05 | Disposition: A | Discharge status: Home / Self Care | Payer: Medicare HMO | Source: Ambulatory Visit | Attending: Cardiology | Admitting: Cardiology

## 2023-01-05 ENCOUNTER — Ambulatory Visit (HOSPITAL_BASED_OUTPATIENT_CLINIC_OR_DEPARTMENT_OTHER)
Admission: RE | Admit: 2023-01-05 | Discharge: 2023-01-05 | Disposition: A | Discharge status: Home / Self Care | Payer: Medicare HMO | Source: Ambulatory Visit | Attending: Family Medicine | Admitting: Family Medicine

## 2023-01-05 ENCOUNTER — Other Ambulatory Visit: Payer: Self-pay | Admitting: Cardiology

## 2023-01-05 DIAGNOSIS — Z87891 Personal history of nicotine dependence: Secondary | ICD-10-CM | POA: Insufficient documentation

## 2023-01-05 DIAGNOSIS — I5020 Unspecified systolic (congestive) heart failure: Secondary | ICD-10-CM

## 2023-01-05 DIAGNOSIS — Z122 Encounter for screening for malignant neoplasm of respiratory organs: Secondary | ICD-10-CM | POA: Diagnosis not present

## 2023-01-05 DIAGNOSIS — R6889 Other general symptoms and signs: Secondary | ICD-10-CM | POA: Diagnosis not present

## 2023-01-05 MED ORDER — GADOBUTROL 1 MMOL/ML IV SOLN
10.0000 mL | Freq: Once | INTRAVENOUS | Status: AC | PRN
  Administered 2023-01-05: 10 mL via INTRAVENOUS

## 2023-01-06 ENCOUNTER — Telehealth: Payer: Self-pay

## 2023-01-06 DIAGNOSIS — I5020 Unspecified systolic (congestive) heart failure: Secondary | ICD-10-CM

## 2023-01-06 NOTE — Telephone Encounter (Signed)
Thank you :)

## 2023-01-06 NOTE — Telephone Encounter (Signed)
Call to patient to explain what heart failure clinic is. Patient says he is willing to accept referral but advises he is legally blind and unable to drive. He states he can do telephone visits and does use [facility] for many of his doctor visits. Referral placed.

## 2023-01-06 NOTE — Progress Notes (Signed)
Send patient a my chart message about heart failure referral. If he is agreeable, please place a referral.  Thanks MJP

## 2023-01-06 NOTE — Telephone Encounter (Signed)
-----   Message from [facility] sent at 01/06/2023  4:50 PM EDT ----- Send patient a my chart message about heart failure referral. If he is agreeable, please place a referral.  Thanks MJP

## 2023-01-07 ENCOUNTER — Ambulatory Visit: Payer: Medicare HMO | Admitting: Dermatology

## 2023-01-07 ENCOUNTER — Encounter: Payer: Self-pay | Admitting: Dermatology

## 2023-01-07 VITALS — BP 90/60 | HR 67

## 2023-01-07 DIAGNOSIS — Z85828 Personal history of other malignant neoplasm of skin: Secondary | ICD-10-CM | POA: Diagnosis not present

## 2023-01-07 DIAGNOSIS — L814 Other melanin hyperpigmentation: Secondary | ICD-10-CM

## 2023-01-07 DIAGNOSIS — L578 Other skin changes due to chronic exposure to nonionizing radiation: Secondary | ICD-10-CM

## 2023-01-07 DIAGNOSIS — L821 Other seborrheic keratosis: Secondary | ICD-10-CM

## 2023-01-07 DIAGNOSIS — W908XXA Exposure to other nonionizing radiation, initial encounter: Secondary | ICD-10-CM

## 2023-01-07 DIAGNOSIS — Z1283 Encounter for screening for malignant neoplasm of skin: Secondary | ICD-10-CM | POA: Diagnosis not present

## 2023-01-07 DIAGNOSIS — D1801 Hemangioma of skin and subcutaneous tissue: Secondary | ICD-10-CM

## 2023-01-07 DIAGNOSIS — Z86006 Personal history of melanoma in-situ: Secondary | ICD-10-CM

## 2023-01-07 DIAGNOSIS — L570 Actinic keratosis: Secondary | ICD-10-CM

## 2023-01-07 DIAGNOSIS — R221 Localized swelling, mass and lump, neck: Secondary | ICD-10-CM

## 2023-01-07 DIAGNOSIS — Z85820 Personal history of malignant melanoma of skin: Secondary | ICD-10-CM

## 2023-01-07 DIAGNOSIS — R229 Localized swelling, mass and lump, unspecified: Secondary | ICD-10-CM

## 2023-01-07 NOTE — Progress Notes (Signed)
   New Patient Visit   Subjective  Jonathon Cain is a 62 y.o. male who presents for the following: Skin Cancer Screening and Full Body Skin Exam  The patient presents for Total-Body Skin Exam (TBSE) for skin cancer screening and mole check. The patient has spots, moles and lesions to be evaluated, some may be new or changing. 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 MIS- L upper back- 2022- excision- T[place]en Pt has no hx of skin cancer.  The following portions of the chart were reviewed this encounter and updated as appropriate: medications, allergies, medical history  Review of Systems:  No other skin or systemic complaints except as noted in HPI or Assessment and Plan.  Objective  Well appearing patient in no apparent distress; mood and affect are within normal limits.  A full examination was performed including scalp, head, eyes, ears, nose, lips, neck, chest, axillae, abdomen, back, buttocks, bilateral upper extremities, bilateral lower extremities, hands, feet, fingers, toes, fingernails, and toenails. All findings within normal limits unless otherwise noted below.   Relevant physical exam findings are noted in the Assessment and Plan.   Assessment & Plan   SKIN CANCER SCREENING PERFORMED TODAY.  ACTINIC DAMAGE - Chronic condition, secondary to cumulative UV/sun exposure - diffuse scaly erythematous macules with underlying dyspigmentation - Recommend daily broad spectrum sunscreen SPF 30+ to sun-exposed areas, reapply every 2 hours as needed.  - Staying in the shade or wearing long sleeves, sun glasses (UVA+UVB protection) and wide brim hats (4-inch brim around the entire circumference of the hat) are also recommended for sun protection.  - Call for new or changing lesions.  MELANOCYTIC NEVI - Tan-brown and/or pink-flesh-colored symmetric macules and papules - Benign appearing on exam today - Observation - Call clinic for new or changing moles - Recommend  daily use of broad spectrum spf 30+ sunscreen to sun-exposed areas.   SEBORRHEIC KERATOSIS - Stuck-on, waxy, tan-brown papules and/or plaques  - Benign-appearing - Discussed benign etiology and prognosis. - Observe - Call for any changes  LENTIGINES Exam: scattered tan macules Due to sun exposure Treatment Plan: Benign-appearing, observe. Recommend daily broad spectrum sunscreen SPF 30+ to sun-exposed areas, reapply every 2 hours as needed.  Call for any changes    HEMANGIOMA Exam: red papule(s) Discussed benign nature. Recommend observation. Call for changes.   History of NMSC and MIS - No evidence of recurrence   AK (actinic keratosis) Mid Frontal Scalp  Destruction of lesion - Mid Frontal Scalp  Destruction method: cryotherapy   Lesion destroyed using liquid nitrogen: Yes   Region frozen until ice ball extended beyond lesion: Yes    Subcutaneous nodule- Left postauricular neck  Related Procedures US Soft Tissue Head/Neck (NON-THYROID)   Return in about 6 months (around 07/08/2023) for TBSE.  I, Tillie Fantasia, CMA, am acting as scribe for Gwenith Daily, MD.   Documentation: I have reviewed the above documentation for accuracy and completeness, and I agree with the above.  Gwenith Daily, MD

## 2023-01-07 NOTE — Patient Instructions (Addendum)
Call radiology at [phone]        Skin Education : We counseled the patient regarding the following: Sun screen (SPF 30 or greater) should be applied during peak UV exposure (between 10am and 2pm) and reapplied after exercise or swimming.  The ABCDEs of melanoma were reviewed with the patient, and the importance of monthly self-examination of moles was emphasized. Should any moles change in shape or color, or itch, bleed or burn, pt will contact our office for evaluation sooner then their interval appointment.  Plan: Sunscreen Recommendations We recommended a broad spectrum sunscreen with a SPF of 30 or higher. SPF 30 sunscreens block approximately 97 percent of the sun's harmful rays. Sunscreens should be applied at least 15 minutes prior to expected sun exposure and then every 2 hours after that as long as sun exposure continues. If swimming or exercising sunscreen should be reapplied every 45 minutes to an hour after getting wet or sweating. One ounce, or the equivalent of a shot glass full of sunscreen, is adequate to protect the skin not covered by a bathing suit. We also recommended a lip balm with a sunscreen as well. Sun protective clothing can be used in lieu of sunscreen but must be worn the entire time you are exposed to the sun's rays.   Important Information   Due to recent changes in healthcare laws, you may see results of your pathology and/or laboratory studies on MyChart before the doctors have had a chance to review them. We understand that in some cases there may be results that are confusing or concerning to you. Please understand that not all results are received at the same time and often the doctors may need to interpret multiple results in order to provide you with the best plan of care or course of treatment. Therefore, we ask that you please give [place] 2 business days to thoroughly review all your results before contacting the office for clarification. Should we see a  critical lab result, you will be contacted sooner.     If You Need Anything After Your Visit   If you have any questions or concerns for your doctor, please call our main line at [phone]. If no one answers, please leave a voicemail as directed and we will return your call as soon as possible. Messages left after 4 pm will be answered the following business day.    You may also send [place] a message via MyChart. We typically respond to MyChart messages within 1-2 business days.  For prescription refills, please ask your pharmacy to contact our office. Our fax number is [phone].  If you have an urgent issue when the clinic is closed that cannot wait until the next business day, you can page your doctor at the number below.     Please note that while we do our best to be available for urgent issues outside of office hours, we are not available 24/7.    If you have an urgent issue and are unable to reach [place], you may choose to seek medical care at your doctor's office, retail clinic, urgent care center, or emergency room.   If you have a medical emergency, please immediately call 911 or go to the emergency department. In the event of inclement weather, please call our main line at [phone] for an update on the status of any delays or closures.  Dermatology Medication Tips: Please keep the boxes that topical medications come in in order to help keep track of  the instructions about where and how to use these. Pharmacies typically print the medication instructions only on the boxes and not directly on the medication tubes.   If your medication is too expensive, please contact our office at [phone] or send [place] a message through MyChart.    We are unable to tell what your co-pay for medications will be in advance as this is different depending on your insurance coverage. However, we may be able to find a substitute medication at lower cost or fill out paperwork to get insurance to cover  a needed medication.    If a prior authorization is required to get your medication covered by your insurance company, please allow [place] 1-2 business days to complete this process.   Drug prices often vary depending on where the prescription is filled and some pharmacies may offer cheaper prices.   The website www.goodrx.com contains coupons for medications through different pharmacies. The prices here do not account for what the cost may be with help from insurance (it may be cheaper with your insurance), but the website can give you the price if you did not use any insurance.  - You can print the associated coupon and take it with your prescription to the pharmacy.  - You may also stop by our office during regular business hours and pick up a GoodRx coupon card.  - If you need your prescription sent electronically to a different pharmacy, notify our office through [facility] or by phone at [phone]

## 2023-01-12 ENCOUNTER — Other Ambulatory Visit: Payer: Self-pay | Admitting: Acute Care

## 2023-01-12 DIAGNOSIS — Z87891 Personal history of nicotine dependence: Secondary | ICD-10-CM

## 2023-01-12 DIAGNOSIS — Z122 Encounter for screening for malignant neoplasm of respiratory organs: Secondary | ICD-10-CM

## 2023-01-14 NOTE — Addendum Note (Signed)
Addended by: Luellen Pucker on: 01/14/2023 05:57 PM   Modules accepted: Orders

## 2023-01-28 ENCOUNTER — Other Ambulatory Visit: Payer: Self-pay | Admitting: Cardiology

## 2023-01-29 ENCOUNTER — Telehealth: Payer: Self-pay | Admitting: Cardiology

## 2023-01-29 NOTE — Telephone Encounter (Signed)
Called the pt back.  He was able to get his follow-up appt with our office scheduled with Jari Favre PA-C for 12/13.  He is still awaiting for CHF clinic to call and schedule the appt with them, from referral placed back in early Oct by Dr. Rosemary Holms.  Informed the pt that I will send a staff message now to our CHF Clinic/team, to call him back to arrange his referral appt with their clinic.  Advised him to be on the listen out.   Pt is aware that I will endorse to CHF clinic in the message that he will require transportation via [facility] or a family member to attend their appt, due to being legally blind.  Pt requested that I mention in the note that the appt should be arranged for 10 am or later, to allow for transportation needs to be met. Endorsed this in the staff message per pt request.  Pt verbalized understanding and agrees with this plan.

## 2023-01-29 NOTE — Telephone Encounter (Signed)
Pt was seen at [facility] before we moved. Per pt Dr Rosemary Holms was going to set him up with a pharmacist and he was suppose to have a f/u. I guess because of the merge it was lost in transition. I have scheduled him a f/u to see Suann Larry. Still needs info about pharmacist. Pt would like a call back. Thanks

## 2023-01-29 NOTE — Telephone Encounter (Signed)
Pt is scheduled with CHF Clinic on 02/03/23 at 0940 with Dr. Gasper Lloyd.   Pt made aware of appt date and time by CHF Clinic Team.

## 2023-02-03 ENCOUNTER — Encounter (HOSPITAL_COMMUNITY): Payer: Medicare HMO | Admitting: Cardiology

## 2023-02-03 ENCOUNTER — Telehealth (HOSPITAL_COMMUNITY): Payer: Self-pay | Admitting: Licensed Clinical Social Worker

## 2023-02-03 NOTE — Telephone Encounter (Signed)
H&V Care Navigation CSW Progress Note  Clinical Social Worker received call from pt requesting help from clinic with getting to appt Friday.  Has insurance transport but its too close to appt to book and no family or friends available.  CSW able to arrange taxi to pick up at 1:15pm and bring to appt.  SDOH Screenings   Food Insecurity: No Food Insecurity (05/26/2022)  Housing: Low Risk  (05/26/2022)  Transportation Needs: Unmet Transportation Needs (02/03/2023)  Utilities: Not At Risk (05/26/2022)  Alcohol Screen: Low Risk  (05/26/2022)  Depression (PHQ2-9): Low Risk  (10/12/2022)  Financial Resource Strain: Low Risk  (05/26/2022)  Physical Activity: Inactive (05/26/2022)  Social Connections: Moderately Integrated (05/26/2022)  Stress: No Stress Concern Present (05/26/2022)  Tobacco Use: Medium Risk (01/07/2023)   Burna Sis, LCSW Clinical Social Worker [facility] Desk#: [phone] Cell#: [phone]

## 2023-02-05 ENCOUNTER — Ambulatory Visit (HOSPITAL_COMMUNITY)
Admission: RE | Admit: 2023-02-05 | Discharge: 2023-02-05 | Disposition: A | Discharge status: Home / Self Care | Payer: Medicare HMO | Source: Ambulatory Visit | Attending: Cardiology | Admitting: Cardiology

## 2023-02-05 ENCOUNTER — Encounter (HOSPITAL_COMMUNITY): Payer: Self-pay | Admitting: Cardiology

## 2023-02-05 ENCOUNTER — Other Ambulatory Visit (HOSPITAL_COMMUNITY): Payer: Self-pay

## 2023-02-05 VITALS — BP 136/86 | HR 70 | Wt 297.4 lb

## 2023-02-05 DIAGNOSIS — I739 Peripheral vascular disease, unspecified: Secondary | ICD-10-CM | POA: Diagnosis not present

## 2023-02-05 DIAGNOSIS — I2510 Atherosclerotic heart disease of native coronary artery without angina pectoris: Secondary | ICD-10-CM

## 2023-02-05 DIAGNOSIS — Z6841 Body Mass Index (BMI) 40.0 and over, adult: Secondary | ICD-10-CM | POA: Insufficient documentation

## 2023-02-05 DIAGNOSIS — G4730 Sleep apnea, unspecified: Secondary | ICD-10-CM | POA: Diagnosis not present

## 2023-02-05 DIAGNOSIS — G4733 Obstructive sleep apnea (adult) (pediatric): Secondary | ICD-10-CM

## 2023-02-05 DIAGNOSIS — Z79899 Other long term (current) drug therapy: Secondary | ICD-10-CM | POA: Diagnosis not present

## 2023-02-05 DIAGNOSIS — I2102 ST elevation (STEMI) myocardial infarction involving left anterior descending coronary artery: Secondary | ICD-10-CM | POA: Diagnosis not present

## 2023-02-05 DIAGNOSIS — I5022 Chronic systolic (congestive) heart failure: Secondary | ICD-10-CM | POA: Diagnosis not present

## 2023-02-05 DIAGNOSIS — H3552 Pigmentary retinal dystrophy: Secondary | ICD-10-CM

## 2023-02-05 DIAGNOSIS — I110 Hypertensive heart disease with heart failure: Secondary | ICD-10-CM | POA: Diagnosis not present

## 2023-02-05 DIAGNOSIS — I5020 Unspecified systolic (congestive) heart failure: Secondary | ICD-10-CM

## 2023-02-05 DIAGNOSIS — I255 Ischemic cardiomyopathy: Secondary | ICD-10-CM | POA: Diagnosis not present

## 2023-02-05 DIAGNOSIS — Z955 Presence of coronary angioplasty implant and graft: Secondary | ICD-10-CM | POA: Insufficient documentation

## 2023-02-05 LAB — BASIC METABOLIC PANEL
Anion gap: 7 (ref 5–15)
BUN: 15 mg/dL (ref 8–23)
CO2: 25 mmol/L (ref 22–32)
Calcium: 8.7 mg/dL — ABNORMAL LOW (ref 8.9–10.3)
Chloride: 104 mmol/L (ref 98–111)
Creatinine, Ser: 1.15 mg/dL (ref 0.61–1.24)
GFR, Estimated: >60 mL/min (ref >60)
Glucose, Bld: 79 mg/dL (ref 70–99)
Potassium: 4.6 mmol/L (ref 3.5–5.1)
Sodium: 136 mmol/L (ref 135–145)

## 2023-02-05 LAB — BRAIN NATRIURETIC PEPTIDE: B Natriuretic Peptide: 144.4 pg/mL — ABNORMAL HIGH (ref 0.0–100.0)

## 2023-02-05 MED ORDER — METOPROLOL SUCCINATE ER 25 MG PO TB24: 12.5000 mg | ORAL_TABLET | Freq: Every evening | ORAL | 3 refills | Status: DC

## 2023-02-05 MED ORDER — DAPAGLIFLOZIN PROPANEDIOL 10 MG PO TABS: 10.0000 mg | ORAL_TABLET | Freq: Every day | ORAL | 5 refills | Status: DC

## 2023-02-05 NOTE — Progress Notes (Signed)
Height:     Weight: BMI:  Today's Date:  STOP BANG RISK ASSESSMENT S (snore) Have you been told that you snore?     YES   T (tired) Are you often tired, fatigued, or sleepy during the day?   YES  O (obstruction) Do you stop breathing, choke, or gasp during sleep? NO   P (pressure) Do you have or are you being treated for high blood pressure? YES   B (BMI) Is your body index greater than 35 kg/m? YES   A (age) Are you 64 years old or older? YES   N (neck) Do you have a neck circumference greater than 16 inches?   NO   G (gender) Are you a male? YES   TOTAL STOP/BANG "YES" ANSWERS 6                                                                       For Office Use Only              Procedure Order Form    YES to 3+ Stop Bang questions OR two clinical symptoms - patient qualifies for WatchPAT (CPT 95800)      Clinical Notes: Will consult Sleep Specialist and refer for management of therapy due to patient increased risk of Sleep Apnea. Ordering a sleep study due to the following two clinical symptoms: Excessive daytime sleepiness G47.10 / Gastroesophageal reflux K21.9 / Nocturia R35.1 / Morning Headaches G44.221 / Difficulty concentrating R41.840 / Memory problems or poor judgment G31.84 / Personality changes or irritability R45.4 / Loud snoring R06.83 / Depression F32.9 / Unrefreshed by sleep G47.8 / Impotence N52.9 / History of high blood pressure R03.0 / Insomnia G47.00

## 2023-02-05 NOTE — Patient Instructions (Signed)
Medication Changes:  START: METOPROLOL SUCCINATE 12.5MG  DAILY AT NIGHT   START: FARXIGA 10MG  ONCE DAILY   Lab Work:  Labs done today, your results will be available in MyChart, we will contact you for abnormal readings.  Testing/Procedures:  Your provider has recommended that you have a home sleep study (Itamar Test).  We have provided you with the equipment in our office today. Please go ahead and download the app. DO NOT OPEN OR TAMPER WITH THE BOX UNTIL WE ADVISE YOU TO DO SO. Once insurance has approved the test our office will call you with PIN number and approval to proceed with testing. Once you have completed the test you just dispose of the equipment, the information is automatically uploaded to [place] via blue-tooth technology. If your test is positive for sleep apnea and you need a home CPAP machine you will be contacted by Dr Norris Cross office [facility]) to set this up.  Referrals: YOU HAVE BEEN REFERRED TO pharmD THEY WILL REACH OUT TO YOU OR CALL TO ARRANGE THIS. PLEASE CALL us WITH ANY CONCERNS   Follow-Up in: 6 weeks as scheduled with Dr. Gasper Lloyd  At the [facility], you and your health needs are our priority. We have a designated team specialized in the treatment of Heart Failure. This Care Team includes your primary Heart Failure Specialized Cardiologist (physician), Advanced Practice Providers (APPs- Physician Assistants and Nurse Practitioners), and Pharmacist who all work together to provide you with the care you need, when you need it.   You may see any of the following providers on your designated Care Team at your next follow up:  Dr. Arvilla Meres Dr. Marca Ancona Dr. Dorthula Nettles Dr. Theresia Bough Tonye Becket, NP Robbie Lis, [place] [facility] [place], [place] Brynda Peon, NP [place] Lee, NP Karle Plumber, PharmD   Please be sure to bring in all your medications bottles to every appointment.   Need to Contact us:  If you  have any questions or concerns before your next appointment please send [place] a message through [place] or call our office at [phone].    TO LEAVE A MESSAGE FOR THE NURSE SELECT OPTION 2, PLEASE LEAVE A MESSAGE INCLUDING: YOUR NAME DATE OF BIRTH CALL BACK NUMBER REASON FOR CALL**this is important as we prioritize the call backs  YOU WILL RECEIVE A CALL BACK THE SAME DAY AS LONG AS YOU CALL BEFORE 4:00 PM

## 2023-02-05 NOTE — Progress Notes (Signed)
ADVANCED HEART FAILURE CLINIC NOTE  Referring Physician: [place], Buren Kos, MD  Primary Care: de [place], Buren Kos, MD Primary Cardiologist: Dr. Joaquim Nam  HPI: Jonathon Cain is a 62 y.o. 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   His cardiac history dates back to atleast 2021 when he underwent PCI to the LAD for anterior STEMI. Referred to CHF clinic after CMR demonstrated LVEF of 30% in 10/24 secondary to ischemic cardiomyopathy. From a functional standpoint, he reports he can walk up 1/4 mile before he becomes severely short of breath.   Activity level/exercise tolerance:  NYHA IIB-III Orthopnea:  Sleeps on 2 pillows Paroxysmal noctural dyspnea:  No Chest pain/pressure:  No Orthostatic lightheadedness:  No Palpitations:  No Lower extremity edema:  No Presyncope/syncope:  No Cough:  No  Past Medical History:  Diagnosis Date   Allergy    Basal cell carcinoma 09/03/2020   sup & nod- Left upper arm-posterior (Cx35FU)   Bladder outlet obstruction    Cancer (HCC)    Skin   CHF (congestive heart failure) (HCC)    Coronary artery disease    Hyperlipidemia    Hypertension    Melanoma (HCC) 09/03/2020   in situ- Left upper back (EXC)   Retinitis pigmentosa     Current Outpatient Medications  Medication Sig Dispense Refill   aspirin EC 81 MG tablet TAKE 1 TABLET BY MOUTH DAILY 90 tablet 3   atorvastatin (LIPITOR) 80 MG tablet TAKE ONE TABLET BY MOUTH EVERYDAY AT BEDTIME 103 tablet 3   cetirizine (ZYRTEC) 10 MG tablet Take 10 mg by mouth daily as needed for allergies.     ENTRESTO 24-26 MG TAKE ONE TABLET BY MOUTH TWICE DAILY 60 tablet 5   fluticasone (FLONASE) 50 MCG/ACT nasal spray Place 2 sprays into both nostrils daily as needed for allergies or rhinitis.      furosemide (LASIX) 40 MG tablet TAKE ONE TABLET BY MOUTH ONCE daily  AS NEEDED ONLY IF you have more THAN usual LEG swelling (Patient taking differently: Take by mouth daily.) 30 tablet 2   isosorbide mononitrate (IMDUR) 30 MG 24 hr tablet TAKE 1/2 TABLET BY MOUTH DAILY 30 tablet 8   rivaroxaban (XARELTO) 2.5 MG TABS tablet TAKE ONE TABLET BY MOUTH EVERY MORNING and TAKE ONE TABLET BY MOUTH EVERY EVENING 180 tablet 3   tamsulosin (FLOMAX) 0.4 MG CAPS capsule Take 1 capsule (0.4 mg total) by mouth daily as needed (urine flow). 90 capsule 1   spironolactone (ALDACTONE) 25 MG tablet Take 1 tablet (25 mg total) by mouth as directed. 12.5 and 25 every other day 90 tablet 3   No current facility-administered medications for this encounter.    No Known Allergies    Social History   Socioeconomic History   Marital status: Single    Spouse name: Not on file   Number of children: 0   Years of education: 16   Highest education level: Bachelor's degree (e.g., BA, AB, BS)  Occupational History   Not on file  Tobacco Use   Smoking status: Former    Current packs/day: 0.00    Average packs/day: 1.5 packs/day for 40.0 years (60.0 ttl pk-yrs)    Types: Cigarettes    Start date: 07/26/1979    Quit date: 07/26/2019    Years since quitting: 3.5   Smokeless tobacco: Never  Vaping  Use   Vaping status: Never Used  Substance and Sexual Activity   Alcohol use: Not Currently    Comment: occasional   Drug use: No   Sexual activity: Not Currently  Other Topics Concern   Not on file  Social History Narrative   Not on file   Social Determinants of Health   Financial Resource Strain: Low Risk  (05/26/2022)   Overall Financial Resource Strain (CARDIA)    Difficulty of Paying Living Expenses: Not hard at all  Food Insecurity: No Food Insecurity (05/26/2022)   Hunger Vital Sign    Worried About Running Out of Food in the Last Year: Never true    Ran Out of Food in the Last Year: Never true  Transportation Needs: Unmet Transportation Needs (02/03/2023)   PRAPARE -  Administrator, Civil Service (Medical): Yes    Lack of Transportation (Non-Medical): No  Physical Activity: Inactive (05/26/2022)   Exercise Vital Sign    Days of Exercise per Week: 0 days    Minutes of Exercise per Session: 0 min  Stress: No Stress Concern Present (05/26/2022)   [employer] of Occupational Health - Occupational Stress Questionnaire    Feeling of Stress : Not at all  Social Connections: Moderately Integrated (05/26/2022)   Social Connection and Isolation Panel [NHANES]    Frequency of Communication with Friends and Family: More than three times a week    Frequency of Social Gatherings with Friends and Family: More than three times a week    Attends Religious Services: More than 4 times per year    Active Member of [employer] or Organizations: Yes    Attends Engineer, structural: More than 4 times per year    Marital Status: Never married  Intimate Partner Violence: Not At Risk (05/26/2022)   Humiliation, Afraid, Rape, and Kick questionnaire    Fear of Current or Ex-Partner: No    Emotionally Abused: No    Physically Abused: No    Sexually Abused: No      Family History  Problem Relation Age of Onset   Heart disease Mother    Hyperlipidemia Mother    Asthma Mother    Heart disease Father    Hypertension Father    Heart disease Maternal Grandmother    Heart disease Maternal Grandfather    Heart disease Paternal Grandmother    Heart disease Paternal Grandfather     PHYSICAL EXAM: Vitals:   02/05/23 1355  BP: 136/86  Pulse: 70  SpO2: 97%   GENERAL: Well nourished, well developed, and in no apparent distress at rest.  HEENT: Negative for arcus senilis or xanthelasma. There is no scleral icterus.  The mucous membranes are pink and moist.   NECK: Supple, No masses. Normal carotid upstrokes without bruits. No masses or thyromegaly.    CHEST: There are no chest wall deformities. There is no chest wall tenderness. Respirations are unlabored.   Lungs- CTA B/L CARDIAC:  JVP: 7 cm H2O         Normal S1, S2  Normal rate with regular rhythm. No murmurs, rubs or gallops.  Pulses are 2+ and symmetrical in upper and lower extremities. No edema.  ABDOMEN: Soft, non-tender, non-distended. There are no masses or hepatomegaly. There are normal bowel sounds.  EXTREMITIES: Warm and well perfused with no cyanosis, clubbing.  LYMPHATIC: No axillary or supraclavicular lymphadenopathy.  NEUROLOGIC: Patient is oriented x3 with no focal or lateralizing neurologic deficits.  PSYCH: Patients affect is appropriate,  there is no evidence of anxiety or depression.  SKIN: Warm and dry; no lesions or wounds.   DATA REVIEW  ECG: 02/05/23: NSR w/ PVC  As per my personal interpretation  ECHO: 11/06/22: Unable to assess LV due to poor visualization  RHC/LHC 09/16/2021: LM: Normal LAD: Patent prox LAD stent with 40% late lumen loss         RFR 0.94 (physiologically non-significant) Lcx: OM1 ostial 50% stenosis (Improved from previous cath in 2021-80% with negative RFR then) RCA: Distal 20%, ostial PDA 50% stenoses   LV: 121/11 mmHg LVEDP: 23 mmHg   RA: 10 mmHg RV: 78/4 mmHg PA: 72/23 mmHg, mPAP 40 mmHg PCW: 24 mmHg   CO: 6.0 L/min CI: 2.4 L/min/m2  CMR: 01/05/23:  IMPRESSION: 1. Mildly dilated LV with EF 30%. Regional wall motion abnormalities as noted above.  2.  Normal RV size with mildly decreased systolic function, EF 46%.  3. Delayed enhancement pattern suggestive of prior LAD-territory MI.  4. Mildly elevated extracellular volume percentage at 33%, suggesting increased myocardial fibrotic content (consistent with scar from prior MI).   ASSESSMENT & PLAN:  Heart Failure with reduced EF Etiology of KG:MWNUUVOZ cardiomyopathy with STEMI to the LAD s/p PCI in 2023  NYHA class / AHA Stage:III Volume status & Diuretics: lasix 40mg  daily, euvolemic Vasodilators:Entresto 24/26mg  BID Beta-Blocker:toprol 12.5mg  qhs DGU:[id number]  25mg  daily Cardiometabolic:will start farxiga 10mg  daily. He has most of his prescription available.  Devices therapies & Valvulopathies: escalate GDMT; will re-evaluate EF in 84m.  Advanced therapies:Not a candidate unfortunately due to blindness.   2. CAD - s/p PCI to the LAD; LHC above - Followed by Dr.Patwardan  3. PAD - s/p PCI to b/l CIA and right SFA   4. Sleep apnea - Reports that sleep study prior to his MI was negative.  - Years since his last sleep study; he is overweight. Will repeat evaluation.   5. Obesity  - Body mass index is 41.48 kg/m. - Will refer to initiate GLP1RA  6. Blindness - secondary to retinitis pigmentosa - He will try stem cell therapy at [facility] soon - He can say vague shapes; can read how many fingers I hold up from 80ft away.   Jonathon Cain Advanced Heart Failure Mechanical Circulatory Support

## 2023-02-05 NOTE — Progress Notes (Signed)
ITAMAR home sleep study given to patient, all instructions explained, waiver signed, and CLOUDPAT registration complete.  

## 2023-02-08 ENCOUNTER — Encounter (INDEPENDENT_AMBULATORY_CARE_PROVIDER_SITE_OTHER): Payer: Self-pay | Admitting: Cardiology

## 2023-02-08 DIAGNOSIS — G4733 Obstructive sleep apnea (adult) (pediatric): Secondary | ICD-10-CM | POA: Diagnosis not present

## 2023-02-09 ENCOUNTER — Ambulatory Visit: Payer: Medicare HMO | Attending: Cardiology

## 2023-02-09 DIAGNOSIS — I5020 Unspecified systolic (congestive) heart failure: Secondary | ICD-10-CM

## 2023-02-09 NOTE — Procedures (Signed)
SLEEP STUDY REPORT Patient Information Study Date: 02/08/2023 Patient Name: Jonathon Cain Patient ID: [id number] Birth Date: 01/28/1961 Age: 63 Gender: Male BMI: 41.7 (W=297 lb, H=5' 11'') Stopbang: 4 Referring Physician: Loletta Specter, DO  TEST DESCRIPTION: Home sleep apnea testing was completed using the WatchPat, a Type 1 device, utilizing  peripheral arterial tonometry (PAT), chest movement, actigraphy, pulse oximetry, pulse rate, body position and snore.  AHI was calculated with apnea and hypopnea using valid sleep time as the denominator. RDI includes apneas,  hypopneas, and RERAs. The data acquired and the scoring of sleep and all associated events were performed in  accordance with the recommended standards and specifications as outlined in the AASM Manual for the Scoring of  Sleep and Associated Events 2.2.0 (2015).   FINDINGS:   1. Mild Obstructive Sleep Apnea with AHI 9.4/hr overall but moderate to severe during REM sleep with REM AHI  28.1/hr.   2. No Central Sleep Apnea with pAHIc 0.9/hr.   3. Oxygen desaturations as low as 86%.   4. Moderate to severe snoring was present. O2 sats were < 88% for 0.5 min.   5. Total sleep time was 4 hrs and 28 min.   6. 16.1% of total sleep time was spent in REM sleep.   7. Shortened sleep onset latency at 9 min.   8. Shortened REM sleep onset latency at 83 min.   9. Total awakenings were 9.  10. Arrhythmia detection: Suggestive of possible brief atrial fibrillation lasting 3 seconds. This is not diagnostic and  further testing with outpatient telemetry monitoring is recommended.  DIAGNOSIS: Mild Obstructive Sleep Apnea (G47.33)  RECOMMENDATIONS: 1. Clinical correlation of these findings is necessary. The decision to treat obstructive sleep apnea (OSA) is usually  based on the presence of apnea symptoms or the presence of associated medical conditions such as Hypertension,  Congestive Heart Failure, Atrial Fibrillation or  Obesity. The most common symptoms of OSA are snoring, gasping for  breath while sleeping, daytime sleepiness and fatigue.   2. Initiating apnea therapy is recommended given the presence of symptoms and/or associated conditions.  Recommend proceeding with one of the following:   a. Auto-CPAP therapy with a pressure range of 5-20cm H2O.   b. An oral appliance (OA) that can be obtained from certain dentists with expertise in sleep medicine. These are  primarily of use in non-obese patients with mild and moderate disease.   c. An ENT consultation which may be useful to look for specific causes of obstruction and possible treatment  options.   d. If patient is intolerant to PAP therapy, consider referral to ENT for evaluation for hypoglossal nerve stimulator.   3. Close follow-up is necessary to ensure success with CPAP or oral appliance therapy for maximum benefit .  4. A follow-up oximetry study on CPAP is recommended to assess the adequacy of therapy and determine the need  for supplemental oxygen or the potential need for Bi-level therapy. An arterial blood gas to determine the adequacy of  baseline ventilation and oxygenation should also be considered.  5. Healthy sleep recommendations include: adequate nightly sleep (normal 7-9 hrs/night), avoidance of caffeine after  noon and alcohol near bedtime, and maintaining a sleep environment that is cool, dark and quiet.  6. Weight loss for overweight patients is recommended. Even modest amounts of weight loss can significantly  improve the severity of sleep apnea.  7. Snoring recommendations include: weight loss where appropriate, side sleeping, and avoidance of alcohol before  bed.  8. Operation of motor vehicle should be avoided when sleepy.  Signature: Armanda Magic, MD; [facility]; Diplomat, American Board of Sleep  Medicine Electronically Signed: 02/09/2023 11:08:38 AM

## 2023-02-15 ENCOUNTER — Telehealth: Payer: Self-pay | Admitting: *Deleted

## 2023-02-15 DIAGNOSIS — I2510 Atherosclerotic heart disease of native coronary artery without angina pectoris: Secondary | ICD-10-CM

## 2023-02-15 DIAGNOSIS — G4733 Obstructive sleep apnea (adult) (pediatric): Secondary | ICD-10-CM

## 2023-02-15 NOTE — Telephone Encounter (Signed)
-----   Message from Armanda Magic sent at 02/09/2023 11:09 AM EST ----- Please let patient know that they have sleep apnea and recommend treating with CPAP.  Please order an auto CPAP from 4-15cm H2O with heated humidity and mask of choice.  Order overnight pulse ox on CPAP.  Followup with me in 6 weeks.

## 2023-02-15 NOTE — Telephone Encounter (Signed)
The patient has been notified of the result and verbalized understanding.  All questions (if any) were answered. Latrelle Dodrill, CMA 02/15/2023 4:11 PM    Upon patient request DME selection is [facility]. Patient understands he will be contacted by [facility] to set up his cpap. Patient understands to call if [facility] does not contact him with new setup in a timely manner. Patient understands they will be called once confirmation has been received from [facility]/ that they have received their new machine to schedule 10 week follow up appointment.   [facility] notified of new cpap order  Please add to airview Patient was grateful for the call and thanked me.

## 2023-02-15 NOTE — Telephone Encounter (Signed)
The patient has been notified of the result and verbalized understanding.  All questions (if any) were answered. Jonathon Cain, CMA 02/15/2023 5:37 PM    Patient has declined to start cpap at this time. He will revisit the cpap in one year if not sooner if needed.

## 2023-02-21 ENCOUNTER — Other Ambulatory Visit: Payer: Self-pay | Admitting: Cardiology

## 2023-03-04 NOTE — Progress Notes (Signed)
Cardiology Office Note:  .   Date:  03/05/2023  ID:  Jonathon Cain, DOB 20-May-1960, MRN [id number] PCP: de [place], Jonathon J, MD  [facility] Providers Cardiologist:  Elder Negus, MD {  History of Present Illness: Jonathon Cain Kitchen   Jonathon Cain is a 62 y.o. 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  A few days prior to his last appointment 09/28/2022 he was having some swelling in his right leg which had since resolved.  Has constant chest tightness that throughout the day occurs with no worsening upon exertion and no difference from before.  Unchanged and lightheadedness without any syncopal episodes.   Today, he presents with a history of heart failure, reports a seasonal change in symptoms. During the summer, the patient experienced dizziness and chest tightness with exertion, but these symptoms have subsided with the onset of colder weather and reduced physical activity. The patient's blood pressure is typically on the lower side, around 90/50, but is currently higher. The patient does not experience lightheadedness with low blood pressure and manages it by drinking water. The patient's heart pump function has decreased from 36% to 25-30%.  The patient also has mild sleep apnea, diagnosed through an at-home sleep study. The patient has a unique sleep pattern, sleeping for four hours, being awake for three hours, and then sleeping for another four hours. The patient does not wish to use a CPAP machine unless the sleep apnea becomes acute or severe.   The patient has been experiencing gastrointestinal issues, including diarrhea, since starting [place]. The symptoms have become manageable over time. The patient also reports a unique pain in the  intestines, described as playing "professional level of Twister."  The patient is able to walk a quarter mile to the mailbox and back, becoming slightly winded but no more than usual. He reports no swelling in the feet or additional shortness of breath.  He saw HF service a month ago and has follow-up at the end of December. Would recommended follow-up echo in the spring to re-evaluate LVEF.   Reports no shortness of breath nor dyspnea on exertion. Reports no chest pain, pressure, or tightness. No edema, orthopnea, PND. Reports no palpitations.    Discussed the use of AI scribe software for clinical note transcription with the patient, who gave verbal consent to proceed.  ROS: pertinent ROS in HPI  Studies Reviewed: .        Cardiac MRI 01/05/23  IMPRESSION: 1. Mildly dilated LV with EF 30%. Regional wall motion abnormalities as noted above.   2.  Normal RV size with mildly decreased systolic function, EF 46%.   3. Delayed enhancement pattern suggestive of prior LAD-territory MI.   4. Mildly elevated extracellular volume percentage at 33%, suggesting increased myocardial fibrotic content (consistent with scar from prior MI).   Jonathon Cain   Echocardiogram 08/21/2021:  Left ventricle cavity is normal in size and wall thickness. Severe global  hypokinesis with akinetic apex. LVEF 25-30%. Doppler evidence of grade III  (restrictive) diastolic dysfunction, elevated LAP.  Mild to moderate mitral regurgitation.  Normal right atrial pressure.  Compared to previous study on 09/18/2020, LVEF has further reduced from  around 35%.    ABI 08/21/2021:  This exam reveals normal perfusion of the right lower extremity (ABI  1.08).  This exam reveals normal perfusion of the left lower extremity (ABI 1.01).   Compared to the study done on 04/17/2020, left ABI has improved from 0.75.    No change in the right.  This represents successful revascularization of  the bilateral SFA by history.   Markedly abnormal monophasic waveform pattern in bilateral posterior  tibial artery.   Lexiscan Tetrofosmin stress test 09/18/2020: Lexiscan nuclear stress test performed using 1-day protocol. Patient experienced dyspnea and chest discomfort SPECT images showed large sized, moderate intensity, fixed perfusion defect in mid to apical, anterior, inferior, and apical myocardium. There is severe decrease in myocardial thickening and wall motion. Stress LVEF 12%. High risk study.   Cardiac MRI 02/01/2020: 1. Subendocardial late gadolinium enhancement consistent with prior infarct in LAD territory. There is >50% transmural LGE suggesting nonviability in mid anterior/anteroseptal walls, apical anterior/septal/inferior walls, and apex. 2. Mild LV dilatation with moderate systolic function (EF 36%). Akinesis of mid to apical anterior, mid anteroseptal, apical septal, apical inferior, and apex. 3.  Normal RV size and systolic function (EF 54%) 4.  Moderate mitral regurgitation (regurgitant fraction 24%) 5.  Moderate right pleural effusion   Coronary angiography 11/14/2019: LM: Diffuse 30% disease. IVUS MLA 7.3 mm2,. dFR 1.0 LAD: Patent prox LAD stent with udner expansion        Optimization with 3.5X15 mm [place] balloon at 22 atm        Excellent expansion as seen on IVUS        TIMI flow II-->III        Diag: Mod diffuse disease LCx: 80% ostial OM1 stenosis. dFR 0.98 RCA: Not engaged today   Left Heart Catheterization 07/26/2019 (Dr. Jacinto Halim):  LM: Mild diffuse disease LAD: Very large caliber vessel, occluded at the proximal end.  Gives origin to high D1 which is large.  Successful PTCA and stenting of the proximal LAD overlapping D1, 3.5 x 24 mm Synergy DES deployed at 12 atmospheric pressure for 60 S. 100% to 0% with TIMI 0 to TIMI-3 flow. LCx: Large vessel, giving origin to a very large OM1 which is secondary branches.  Ostium of OM1 has 80% stenosis. RCA: Mild diffuse disease, distal RCA 15 to  20% stenosis, PDA 50% stenosis.   Recommendation: Patient will need aggressive risk factor modification, dual antiplatelet therapy with aspirin and Brilinta for a year, will be watched carefully in the intensive care unit for preshock, suspect his EDP is extremely high at the end of the procedure with excessive contrast use.  Will monitor for any contrast nephropathy as well.      Physical Exam:   VS:  BP 104/70   Pulse (!) 52   Ht 5\' 11"  (1.803 m)   Wt 298 lb (135.2 kg)   SpO2 97%   BMI 41.56 kg/m    Wt Readings from Last 3 Encounters:  03/05/23 298 lb (135.2 kg)  02/05/23 297 lb 6.4 oz (134.9 kg)  01/05/23 297 lb (134.7 kg)    GEN: Well nourished, well developed in no acute distress NECK: No JVD; No carotid bruits CARDIAC: RRR, no murmurs, rubs, gallops RESPIRATORY:  Clear to auscultation without rales, wheezing or rhonchi  ABDOMEN: Soft, non-tender, non-distended EXTREMITIES:  No edema; No deformity   ASSESSMENT AND PLAN: .    PAD -no symptoms of claudication at this time  Coronary artery disease -no recent chest pain or SOB -Continue current medication regimen -continue heart healthy, low sodium diet  Heart Failure with reduced EF/ischemic CM Stable symptoms with  reduced exertion due to seasonal changes. Ejection fraction decreased from 36% to 25-30%. No changes in medication regimen. Noted low-normal blood pressure, patient reports this is typical for him. -Continue current medication regimen including Entresto. -Consider follow-up echocardiogram in late February to mid-March as per heart failure team's discretion.  Sleep Apnea Mild sleep apnea diagnosed via home sleep study. Patient is non-compliant with CPAP due to lifestyle and sleep pattern. -No changes recommended at this time.  Follow-up Scheduled to see heart failure team and pharmacist on 03/23/2023. -Schedule follow-up with general cardiologist in March 2025.      Dispo: He can follow-up in 3-4 months  with Dr. Rosemary Holms  Signed, Sharlene Dory, PA-C

## 2023-03-05 ENCOUNTER — Encounter: Payer: Self-pay | Admitting: Physician Assistant

## 2023-03-05 ENCOUNTER — Ambulatory Visit: Payer: Medicare HMO | Attending: Physician Assistant | Admitting: Physician Assistant

## 2023-03-05 VITALS — BP 104/70 | HR 52 | Ht 71.0 in | Wt 298.0 lb

## 2023-03-05 DIAGNOSIS — I498 Other specified cardiac arrhythmias: Secondary | ICD-10-CM

## 2023-03-05 DIAGNOSIS — I5020 Unspecified systolic (congestive) heart failure: Secondary | ICD-10-CM | POA: Diagnosis not present

## 2023-03-05 DIAGNOSIS — I739 Peripheral vascular disease, unspecified: Secondary | ICD-10-CM

## 2023-03-05 DIAGNOSIS — I2510 Atherosclerotic heart disease of native coronary artery without angina pectoris: Secondary | ICD-10-CM | POA: Diagnosis not present

## 2023-03-05 NOTE — Patient Instructions (Signed)
Medication Instructions:  Your physician recommends that you continue on your current medications as directed. Please refer to the Current Medication list given to you today.  *If you need a refill on your cardiac medications before your next appointment, please call your pharmacy*   Lab Work: None ordered  If you have labs (blood work) drawn today and your tests are completely normal, you will receive your results only by: MyChart Message (if you have MyChart) OR A paper copy in the mail If you have any lab test that is abnormal or we need to change your treatment, we will call you to review the results.   Testing/Procedures: None ordered   Follow-Up: At [facility], you and your health needs are our priority.  As part of our continuing mission to provide you with exceptional heart care, we have created designated Provider Care Teams.  These Care Teams include your primary Cardiologist (physician) and Advanced Practice Providers (APPs -  Physician Assistants and Nurse Practitioners) who all work together to provide you with the care you need, when you need it.  We recommend signing up for the patient portal called "MyChart".  Sign up information is provided on this After Visit Summary.  MyChart is used to connect with patients for Virtual Visits (Telemedicine).  Patients are able to view lab/test results, encounter notes, upcoming appointments, etc.  Non-urgent messages can be sent to your provider as well.   To learn more about what you can do with MyChart, go to [email or web address].    Your next appointment:   3-4 month(s)  Provider:   Elder Negus, MD     Other Instructions

## 2023-03-21 ENCOUNTER — Other Ambulatory Visit: Payer: Self-pay | Admitting: Cardiology

## 2023-03-21 DIAGNOSIS — I739 Peripheral vascular disease, unspecified: Secondary | ICD-10-CM

## 2023-03-21 DIAGNOSIS — I5020 Unspecified systolic (congestive) heart failure: Secondary | ICD-10-CM

## 2023-03-21 DIAGNOSIS — I25118 Atherosclerotic heart disease of native coronary artery with other forms of angina pectoris: Secondary | ICD-10-CM

## 2023-03-22 NOTE — Telephone Encounter (Signed)
Pt last saw Jari Favre, [place] on 03/05/23, last labs 02/05/23 Creat 1.15, age 62, weight 135.2kg, pt is on Xarelto 2.5mg  BID for PAD per Dr Rosemary Holms.  Will refill rx.

## 2023-03-23 ENCOUNTER — Other Ambulatory Visit (HOSPITAL_COMMUNITY): Payer: Self-pay

## 2023-03-23 ENCOUNTER — Telehealth: Payer: Self-pay | Admitting: Pharmacy Technician

## 2023-03-23 ENCOUNTER — Telehealth: Payer: Self-pay | Admitting: Pharmacist Clinician (PhC)/ Clinical Pharmacy Specialist

## 2023-03-23 ENCOUNTER — Ambulatory Visit
Payer: Medicare HMO | Attending: Cardiovascular Disease | Admitting: Pharmacist Clinician (PhC)/ Clinical Pharmacy Specialist

## 2023-03-23 ENCOUNTER — Encounter (HOSPITAL_COMMUNITY): Payer: Medicare HMO | Admitting: Cardiology

## 2023-03-23 ENCOUNTER — Encounter: Payer: Self-pay | Admitting: Pharmacist Clinician (PhC)/ Clinical Pharmacy Specialist

## 2023-03-23 VITALS — Ht 71.0 in | Wt 300.0 lb

## 2023-03-23 DIAGNOSIS — I5020 Unspecified systolic (congestive) heart failure: Secondary | ICD-10-CM | POA: Diagnosis not present

## 2023-03-23 MED ORDER — EMPAGLIFLOZIN 10 MG PO TABS: 10.0000 mg | ORAL_TABLET | Freq: Every day | ORAL | 1 refills | Status: DC

## 2023-03-23 NOTE — Assessment & Plan Note (Signed)
 Patient currently on Farxiga  for HFrEF.  Has developed itching/rash on chest/neck areas as well as on top of head.  Notes that it started shortly after starting Farxiga .   Will have him discontinue this and see if rash resolves.  If so, he can then start on jardiance  10 mg daily.

## 2023-03-23 NOTE — Patient Instructions (Signed)
 We will start the prior authorization process to get Wegovy  covered by your insurance.   Stop Farxiga  for about 1-2 weeks then try Jardiance  10 mg daily  TIPS FOR SUCCESS Write down the reasons why you want to lose weight and post it in a place where you'll see it often. Start small and work your way up. Keep in mind that it takes time to achieve goals, and small steps add up. Any additional movements help to burn calories. Taking the stairs rather than the elevator and parking at the far end of your parking lot are easy ways to start. Brisk walking for at least 30 minutes 4 or more days of the week is an excellent goal to work toward  [place] [employer] WHAT IT MEANS TO FEEL FULL Did you know that it can take 15 minutes or more for your brain to receive the message that you've eaten? That means that, if you eat less food, but consume it slower, you may still feel satisfied. Eating a lot of fruits and vegetables can also help you feel fuller. Eat off of smaller plates so that moderate portions don't seem too small  TITRATION PLAN Will plan to follow the titration plan as below, pending patient is tolerating each dose before increasing to the next. Can slow titration if needed for tolerability.    -Weeks 1-4: Inject 0.25 mg SQ once weekly  -Weeks 5-8: Inject 0.5 mg SQ once weekly  -Weeks 9-12 Inject 1 mg SQ once weekly  -Weeks 13-16: Inject 1.7 SQ once weekly   Follow up in 3months.  If you have any questions or concerns, please reach out to us .  Jamarkus Lisbon/Chris at [phone].  THANK YOU FOR CHOOSING [facility]

## 2023-03-23 NOTE — Telephone Encounter (Signed)
Please do PA for [facility].  Patient has history of MI as well as peripheral revascularization.  New insurance cards for 2025 should have been scanned in today.

## 2023-03-23 NOTE — Telephone Encounter (Signed)
 Pharmacy Patient Advocate Encounter   Received notification from Pt Calls Messages that prior authorization for wegovy  is required/requested.   Insurance verification completed.   The patient is insured through [employer] .   Per test claim: PA required; PA submitted to above mentioned insurance via CoverMyMeds Key/confirmation #/EOC [id number] Status is pending

## 2023-03-23 NOTE — Progress Notes (Signed)
 Office Visit    Patient Name: Jonathon Cain Date of Encounter: 03/23/2023  Primary Care Provider:  [place], Quintin PARAS, MD Primary Cardiologist:  Newman PARAS Lawrence, MD  Chief Complaint    Weight management  Significant Past Medical History   ASCVD STEMI - PCI to LAD 5/21  CHF EF 25-30%, on GDMT  HLD 1/24 LDL 58 on atorvastatin  80  PAD Bilateral SFA revascularization   Legally blind 2/2 retinitis pigmentosa    No Known Allergies  History of Present Illness    Jonathon Cain is a 62 y.o. male patient of Dr Jonathon Cain, in the office to discuss options for weight management.  He notes today that he has had itching on his chest and neck area, as well as the top of his head, since starting the Farxiga .    Current weight management medications: none  Previously tried meds: none; [place] [facility], [employer] (prepackaged plan), [place]  Current meds that may affect weight:  none  Baseline weight/BMI:   Insurance payor:  [employer]  [phone]  Diet: home cooked meals mostly variety of proteins, mostly chicken and turkey, vegetables frozen; snacking is nuts, chips, PopTarts  Exercise: none  Family History: both parents with heart disease, as did all grandparents;  mother still living in her 3's  Confirmed patient has no personal or family history of medullary thyroid  carcinoma (MTC) or Multiple Endocrine Neoplasia syndrome type 2 (MEN 2).   Social History:   Tobacco:no - quit at time of MI  Alcohol:no - quit at time of MI   Accessory Clinical Findings    Lab Results  Component Value Date   CREATININE 1.15 02/05/2023   BUN 15 02/05/2023   NA 136 02/05/2023   K 4.6 02/05/2023   CL 104 02/05/2023   CO2 25 02/05/2023   Lab Results  Component Value Date   ALT 19 03/26/2022   AST 22 03/26/2022   ALKPHOS 148 (H) 03/26/2022   BILITOT 1.0 03/26/2022   Lab Results  Component Value Date   HGBA1C 5.5 03/26/2022      Home Medications/Allergies     Current Outpatient Medications  Medication Sig Dispense Refill   empagliflozin  (JARDIANCE ) 10 MG TABS tablet Take 1 tablet (10 mg total) by mouth daily before breakfast. 30 tablet 1   aspirin  EC 81 MG tablet TAKE 1 TABLET BY MOUTH DAILY 90 tablet 3   atorvastatin  (LIPITOR ) 80 MG tablet TAKE 1 TABLET EVERY NIGHT AT BEDTIME 90 tablet 3   cetirizine (ZYRTEC) 10 MG tablet Take 10 mg by mouth daily as needed for allergies.     fluticasone (FLONASE) 50 MCG/ACT nasal spray Place 2 sprays into both nostrils daily as needed for allergies or rhinitis.      furosemide  (LASIX ) 40 MG tablet TAKE ONE TABLET BY MOUTH ONCE daily AS NEEDED ONLY IF you have more THAN usual LEG swelling (Patient taking differently: Take by mouth daily.) 30 tablet 2   isosorbide  mononitrate (IMDUR ) 30 MG 24 hr tablet TAKE 1/2 TABLET BY MOUTH DAILY 30 tablet 8   metoprolol  succinate (TOPROL -XL) 25 MG 24 hr tablet Take 0.5 tablets (12.5 mg total) by mouth at bedtime. Take with or immediately following a meal. 45 tablet 3   rivaroxaban  (XARELTO ) 2.5 MG TABS tablet TAKE 1 TABLET BY MOUTH EVERY MORNING AND EVENING 180 tablet 3   sacubitril -valsartan  (ENTRESTO ) 24-26 MG TAKE 1 TABLET BY MOUTH TWICE DAILY 180 tablet 3  spironolactone  (ALDACTONE ) 25 MG tablet Take 1 tablet (25 mg total) by mouth as directed. 12.5 and 25 every other day 90 tablet 3   tamsulosin  (FLOMAX ) 0.4 MG CAPS capsule Take 1 capsule (0.4 mg total) by mouth daily as needed (urine flow). 90 capsule 1   No current facility-administered medications for this visit.     No Known Allergies  Assessment & Plan    HFrEF (heart failure with reduced ejection fraction) (HCC) Patient currently on Farxiga  for HFrEF.  Has developed itching/rash on chest/neck areas as well as on top of head.  Notes that it started shortly after starting Farxiga .   Will have him discontinue this and see if rash resolves.  If so, he can then start on jardiance  10 mg daily.    Morbid obesity  (HCC)  Patient has not met goal of at least 5% of body weight loss with comprehensive lifestyle modifications alone in the past 3-6 months. Pharmacotherapy is appropriate to pursue as augmentation. Will start Wegovy .  Patient has history of peripheral revascularization as well as prior MI.   Confirmed patient has no personal or family history of medullary thyroid  carcinoma (MTC) or Multiple Endocrine Neoplasia syndrome type 2 (MEN 2).   Advised patient on common side effects including nausea, diarrhea, dyspepsia, decreased appetite, and fatigue. Counseled patient on reducing meal size and how to titrate medication to minimize side effects. Patient aware to call if intolerable side effects or if experiencing dehydration, abdominal pain, or dizziness. Patient will adhere to dietary modifications and will target at least 150 minutes of moderate intensity exercise weekly.   Injection technique reviewed at today's visit.    Titration Plan:  Will plan to follow the titration plan as below, pending patient is tolerating each dose before increasing to the next. Can slow titration if needed for tolerability.    -Month 1: Inject 0.25 mg SQ once weekly x 4 weeks -Month 2: Inject 0.5 mg SQ once weekly x 4 weeks -Month 3: Inject 1 mg SQ once weekly x 4 weeks -Month 4+: Inject 1.7 mg SQ once weekly   Follow up in 3 months.   Capitola Ladson PharmD CPP [facility]  [address] [place], [place] [address] [phone]

## 2023-03-23 NOTE — Assessment & Plan Note (Signed)
  Patient has not met goal of at least 5% of body weight loss with comprehensive lifestyle modifications alone in the past 3-6 months. Pharmacotherapy is appropriate to pursue as augmentation. Will start Wegovy .  Patient has history of peripheral revascularization as well as prior MI.   Confirmed patient has no personal or family history of medullary thyroid  carcinoma (MTC) or Multiple Endocrine Neoplasia syndrome type 2 (MEN 2).   Advised patient on common side effects including nausea, diarrhea, dyspepsia, decreased appetite, and fatigue. Counseled patient on reducing meal size and how to titrate medication to minimize side effects. Patient aware to call if intolerable side effects or if experiencing dehydration, abdominal pain, or dizziness. Patient will adhere to dietary modifications and will target at least 150 minutes of moderate intensity exercise weekly.   Injection technique reviewed at today's visit.    Titration Plan:  Will plan to follow the titration plan as below, pending patient is tolerating each dose before increasing to the next. Can slow titration if needed for tolerability.    -Month 1: Inject 0.25 mg SQ once weekly x 4 weeks -Month 2: Inject 0.5 mg SQ once weekly x 4 weeks -Month 3: Inject 1 mg SQ once weekly x 4 weeks -Month 4+: Inject 1.7 mg SQ once weekly   Follow up in 3 months.

## 2023-03-25 ENCOUNTER — Telehealth: Payer: Self-pay | Admitting: Pharmacy Technician

## 2023-03-25 ENCOUNTER — Other Ambulatory Visit (HOSPITAL_COMMUNITY): Payer: Self-pay

## 2023-03-25 NOTE — Telephone Encounter (Signed)
 Pharmacy Patient Advocate Encounter   Received notification from Pt Calls Messages that prior authorization for wegovy  is required/requested.   Insurance verification completed.   The patient is insured through [employer] .   Per test claim: PA required; PA submitted to above mentioned insurance via CoverMyMeds Key/confirmation #/EOC [id number] Status is pending

## 2023-03-25 NOTE — Telephone Encounter (Signed)
 Pharmacy Patient Advocate Encounter  Received notification from [employer] that Prior Authorization for wegovy  has been APPROVED from 03/24/23 to 03/24/24. Ran test claim, Copay is $393.71 one month . This test claim was processed through [facility]- copay amounts may vary at other pharmacies due to pharmacy/plan contracts, or as the patient moves through the different stages of their insurance plan.   PA #/Case ID/Reference #: [id number]

## 2023-03-25 NOTE — Telephone Encounter (Signed)
 Pharmacy Patient Advocate Encounter  Received notification from [facility] that Prior Authorization for wegovy has been APPROVED from 05/29/22 to 03/22/24   PA #/Case ID/Reference #: [id number]

## 2023-03-30 ENCOUNTER — Other Ambulatory Visit: Payer: Self-pay | Admitting: Cardiology

## 2023-03-30 DIAGNOSIS — I5020 Unspecified systolic (congestive) heart failure: Secondary | ICD-10-CM

## 2023-04-02 MED ORDER — WEGOVY 0.25 MG/0.5ML ~~LOC~~ SOAJ: 0.2500 mg | SUBCUTANEOUS | 0 refills | Status: DC

## 2023-04-02 MED ORDER — WEGOVY 0.5 MG/0.5ML ~~LOC~~ SOAJ: 0.5000 mg | SUBCUTANEOUS | 0 refills | Status: DC

## 2023-04-02 NOTE — Addendum Note (Signed)
 Addended by: Rosalee Kaufman on: 04/02/2023 08:06 AM   Modules accepted: Orders

## 2023-04-08 ENCOUNTER — Other Ambulatory Visit (HOSPITAL_BASED_OUTPATIENT_CLINIC_OR_DEPARTMENT_OTHER): Payer: Medicare (Managed Care)

## 2023-04-08 DIAGNOSIS — Z0000 Encounter for general adult medical examination without abnormal findings: Secondary | ICD-10-CM

## 2023-04-09 LAB — COMPREHENSIVE METABOLIC PANEL
ALT: 25 [IU]/L (ref 0–44)
AST: 26 [IU]/L (ref 0–40)
Albumin: 4 g/dL (ref 3.9–4.9)
Alkaline Phosphatase: 180 [IU]/L — ABNORMAL HIGH (ref 44–121)
BUN/Creatinine Ratio: 13 (ref 10–24)
BUN: 15 mg/dL (ref 8–27)
Bilirubin Total: 1.1 mg/dL (ref 0.0–1.2)
CO2: 17 mmol/L — ABNORMAL LOW (ref 20–29)
Calcium: 9.2 mg/dL (ref 8.6–10.2)
Chloride: 102 mmol/L (ref 96–106)
Creatinine, Ser: 1.18 mg/dL (ref 0.76–1.27)
Globulin, Total: 3.5 g/dL (ref 1.5–4.5)
Glucose: 99 mg/dL (ref 70–99)
Potassium: 4.5 mmol/L (ref 3.5–5.2)
Sodium: 141 mmol/L (ref 134–144)
Total Protein: 7.5 g/dL (ref 6.0–8.5)
eGFR: 69 mL/min/{1.73_m2} (ref >59)

## 2023-04-09 LAB — CBC WITH DIFFERENTIAL/PLATELET
Basophils Absolute: 0.1 10*3/uL (ref 0.0–0.2)
Basos: 1 %
EOS (ABSOLUTE): 0.4 10*3/uL (ref 0.0–0.4)
Eos: 3 %
Hematocrit: 48.1 % (ref 37.5–51.0)
Hemoglobin: 15.3 g/dL (ref 13.0–17.7)
Immature Grans (Abs): 0 10*3/uL (ref 0.0–0.1)
Immature Granulocytes: 0 %
Lymphocytes Absolute: 2.9 10*3/uL (ref 0.7–3.1)
Lymphs: 20 %
MCH: 29.1 pg (ref 26.6–33.0)
MCHC: 31.8 g/dL (ref 31.5–35.7)
MCV: 91 fL (ref 79–97)
Monocytes Absolute: 1.3 10*3/uL — ABNORMAL HIGH (ref 0.1–0.9)
Monocytes: 9 %
Neutrophils Absolute: 9.7 10*3/uL — ABNORMAL HIGH (ref 1.4–7.0)
Neutrophils: 67 %
Platelets: 262 10*3/uL (ref 150–450)
RBC: 5.26 x10E6/uL (ref 4.14–5.80)
RDW: 12.6 % (ref 11.6–15.4)
WBC: 14.5 10*3/uL — ABNORMAL HIGH (ref 3.4–10.8)

## 2023-04-09 LAB — HEMOGLOBIN A1C
Est. average glucose Bld gHb Est-mCnc: 114 mg/dL
Hgb A1c MFr Bld: 5.6 % (ref 4.8–5.6)

## 2023-04-09 LAB — LIPID PANEL
Chol/HDL Ratio: 3.5 {ratio} (ref 0.0–5.0)
Cholesterol, Total: 115 mg/dL (ref 100–199)
HDL: 33 mg/dL — ABNORMAL LOW (ref >39)
LDL Chol Calc (NIH): 60 mg/dL (ref 0–99)
Triglycerides: 121 mg/dL (ref 0–149)
VLDL Cholesterol Cal: 22 mg/dL (ref 5–40)

## 2023-04-09 LAB — TSH RFX ON ABNORMAL TO FREE T4: TSH: 3.58 u[IU]/mL (ref 0.450–4.500)

## 2023-04-13 NOTE — Progress Notes (Signed)
ADVANCED HEART FAILURE CLINIC NOTE  Referring Physician: de [place], Buren Kos, MD  Primary Care: de [place], Buren Kos, MD Primary Cardiologist: Dr. Joaquim Nam  HPI: Jonathon Cain is a 63 y.o. 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   His cardiac history dates back to atleast 2021 when he underwent PCI to the LAD for anterior STEMI. Referred to CHF clinic after CMR demonstrated LVEF of 30% in 10/24 secondary to ischemic cardiomyopathy. From a functional standpoint, he reports he can walk up 1/4 mile before he becomes severely short of breath.   Activity level/exercise tolerance:  NYHA IIB-III Orthopnea:  Sleeps on 2 pillows Paroxysmal noctural dyspnea:  No Chest pain/pressure:  No Orthostatic lightheadedness:  No Palpitations:  No Lower extremity edema:  No Presyncope/syncope:  No Cough:  No  Past Medical History:  Diagnosis Date   Allergy    Basal cell carcinoma 09/03/2020   sup & nod- Left upper arm-posterior (Cx35FU)   Bladder outlet obstruction    Cancer (HCC)    Skin   CHF (congestive heart failure) (HCC)    Coronary artery disease    Hyperlipidemia    Hypertension    Melanoma (HCC) 09/03/2020   in situ- Left upper back (EXC)   Retinitis pigmentosa     Current Outpatient Medications  Medication Sig Dispense Refill   aspirin EC 81 MG tablet TAKE 1 TABLET BY MOUTH DAILY 90 tablet 3   atorvastatin (LIPITOR) 80 MG tablet TAKE 1 TABLET EVERY NIGHT AT BEDTIME 90 tablet 3   cetirizine (ZYRTEC) 10 MG tablet Take 10 mg by mouth daily as needed for allergies.     empagliflozin (JARDIANCE) 10 MG TABS tablet Take 1 tablet (10 mg total) by mouth daily before breakfast. 30 tablet 1   fluticasone (FLONASE) 50 MCG/ACT nasal spray Place 2 sprays into both nostrils daily as needed for allergies or rhinitis.      furosemide (LASIX) 40 MG  tablet TAKE 1 TABLET DAILY AS NEEDED FOR LEG SWELLING 30 tablet 3   isosorbide mononitrate (IMDUR) 30 MG 24 hr tablet TAKE 1/2 TABLET BY MOUTH DAILY 30 tablet 8   metoprolol succinate (TOPROL-XL) 25 MG 24 hr tablet Take 0.5 tablets (12.5 mg total) by mouth at bedtime. Take with or immediately following a meal. 45 tablet 3   rivaroxaban (XARELTO) 2.5 MG TABS tablet TAKE 1 TABLET BY MOUTH EVERY MORNING AND EVENING 180 tablet 3   sacubitril-valsartan (ENTRESTO) 24-26 MG TAKE 1 TABLET BY MOUTH TWICE DAILY 180 tablet 3   Semaglutide-Weight Management (WEGOVY) 0.25 MG/0.5ML SOAJ Inject 0.25 mg into the skin once a week. FOR 4 WEEKS 2 mL 0   Semaglutide-Weight Management (WEGOVY) 0.5 MG/0.5ML SOAJ Inject 0.5 mg into the skin once a week. FOR 4 WEEKS WHEN YOU FINISH THE 0.25 MG 2 mL 0   spironolactone (ALDACTONE) 25 MG tablet Take 1 tablet (25 mg total) by mouth as directed. 12.5 and 25 every other day 90 tablet 3   tamsulosin (FLOMAX) 0.4 MG CAPS capsule Take 1 capsule (0.4 mg total) by mouth daily as needed (urine flow). 90 capsule 1   No current facility-administered medications for this visit.    No Known Allergies    Social History   Socioeconomic History   Marital status: Single    Spouse name: Not on file   Number of children: 0  Years of education: 32   Highest education level: Bachelor's degree (e.g., BA, AB, BS)  Occupational History   Not on file  Tobacco Use   Smoking status: Former    Current packs/day: 0.00    Average packs/day: 1.5 packs/day for 40.0 years (60.0 ttl pk-yrs)    Types: Cigarettes    Start date: 07/26/1979    Quit date: 07/26/2019    Years since quitting: 3.7   Smokeless tobacco: Never  Vaping Use   Vaping status: Never Used  Substance and Sexual Activity   Alcohol use: Not Currently    Comment: occasional   Drug use: No   Sexual activity: Not Currently  Other Topics Concern   Not on file  Social History Narrative   Not on file   Social Drivers of  Health   Financial Resource Strain: Low Risk  (05/26/2022)   Overall Financial Resource Strain (CARDIA)    Difficulty of Paying Living Expenses: Not hard at all  Food Insecurity: No Food Insecurity (05/26/2022)   Hunger Vital Sign    Worried About Running Out of Food in the Last Year: Never true    Ran Out of Food in the Last Year: Never true  Transportation Needs: Unmet Transportation Needs (02/03/2023)   PRAPARE - Administrator, Civil Service (Medical): Yes    Lack of Transportation (Non-Medical): No  Physical Activity: Inactive (05/26/2022)   Exercise Vital Sign    Days of Exercise per Week: 0 days    Minutes of Exercise per Session: 0 min  Stress: No Stress Concern Present (05/26/2022)   [employer] of Occupational Health - Occupational Stress Questionnaire    Feeling of Stress : Not at all  Social Connections: Moderately Integrated (05/26/2022)   Social Connection and Isolation Panel [NHANES]    Frequency of Communication with Friends and Family: More than three times a week    Frequency of Social Gatherings with Friends and Family: More than three times a week    Attends Religious Services: More than 4 times per year    Active Member of [employer] or Organizations: Yes    Attends Engineer, structural: More than 4 times per year    Marital Status: Never married  Intimate Partner Violence: Not At Risk (05/26/2022)   Humiliation, Afraid, Rape, and Kick questionnaire    Fear of Current or Ex-Partner: No    Emotionally Abused: No    Physically Abused: No    Sexually Abused: No      Family History  Problem Relation Age of Onset   Heart disease Mother    Hyperlipidemia Mother    Asthma Mother    Heart disease Father    Hypertension Father    Heart disease Maternal Grandmother    Heart disease Maternal Grandfather    Heart disease Paternal Grandmother    Heart disease Paternal Grandfather     PHYSICAL EXAM: There were no vitals filed for this  visit. GENERAL: Well nourished, well developed, and in no apparent distress at rest.  HEENT: There is no scleral icterus.  The mucous membranes are pink and moist.   CHEST: There are no chest wall deformities. There is no chest wall tenderness. Respirations are unlabored.  Lungs- *** CARDIAC:  JVP: *** cm          Normal rate with regular rhythm. *** murmur.  Pulses are 2+ and symmetrical in upper and lower extremities. *** edema.  ABDOMEN: Soft, non-tender, non-distended. There are normal bowel  sounds.  EXTREMITIES: Warm and well perfused.  NEUROLOGIC: Patient is oriented x3 with no obvious focal neurologic deficits.  PSYCH: Patients affect is appropriate SKIN: Warm and dry; no lesions or wounds.    DATA REVIEW  ECG: 02/05/23: NSR w/ PVC  As per my personal interpretation  ECHO: 11/06/22: Unable to assess LV due to poor visualization  RHC/LHC 09/16/2021: LM: Normal LAD: Patent prox LAD stent with 40% late lumen loss         RFR 0.94 (physiologically non-significant) Lcx: OM1 ostial 50% stenosis (Improved from previous cath in 2021-80% with negative RFR then) RCA: Distal 20%, ostial PDA 50% stenoses   LV: 121/11 mmHg LVEDP: 23 mmHg   RA: 10 mmHg RV: 78/4 mmHg PA: 72/23 mmHg, mPAP 40 mmHg PCW: 24 mmHg   CO: 6.0 L/min CI: 2.4 L/min/m2  CMR: 01/05/23:  IMPRESSION: 1. Mildly dilated LV with EF 30%. Regional wall motion abnormalities as noted above.  2.  Normal RV size with mildly decreased systolic function, EF 46%.  3. Delayed enhancement pattern suggestive of prior LAD-territory MI.  4. Mildly elevated extracellular volume percentage at 33%, suggesting increased myocardial fibrotic content (consistent with scar from prior MI).   ASSESSMENT & PLAN:  Heart Failure with reduced EF Etiology of EA:[id number] cardiomyopathy with STEMI to the LAD s/p PCI in 2023  NYHA class / AHA Stage:III Volume status & Diuretics: lasix 40mg  daily, euvolemic Vasodilators:Entresto 24/26mg   BID Beta-Blocker:toprol 12.5mg  qhs JYN:[id number] 25mg  daily Cardiometabolic:will start farxiga 10mg  daily. He has most of his prescription available.  Devices therapies & Valvulopathies: escalate GDMT; will re-evaluate EF in 45m.  Advanced therapies:Not a candidate unfortunately due to blindness.   2. CAD - s/p PCI to the LAD; LHC above - Followed by Dr.Patwardan  3. PAD - s/p PCI to b/l CIA and right SFA   4. Sleep apnea - Reports that sleep study prior to his MI was negative.  - Years since his last sleep study; he is overweight. Will repeat evaluation.   5. Obesity  - There is no height or weight on file to calculate BMI. - Will refer to initiate GLP1RA  6. Blindness - secondary to retinitis pigmentosa - He will try stem cell therapy at [facility] soon - He can say vague shapes; can read how many fingers I hold up from 44ft away.   Jonathon Cain Advanced Heart Failure Mechanical Circulatory Support

## 2023-04-14 ENCOUNTER — Encounter (HOSPITAL_COMMUNITY): Payer: Self-pay | Admitting: Cardiology

## 2023-04-14 ENCOUNTER — Encounter (HOSPITAL_BASED_OUTPATIENT_CLINIC_OR_DEPARTMENT_OTHER): Payer: Self-pay | Admitting: Family Medicine

## 2023-04-14 ENCOUNTER — Ambulatory Visit (HOSPITAL_COMMUNITY)
Admission: RE | Admit: 2023-04-14 | Discharge: 2023-04-14 | Disposition: A | Discharge status: Home / Self Care | Payer: Medicare (Managed Care) | Source: Ambulatory Visit | Attending: Cardiology | Admitting: Cardiology

## 2023-04-14 VITALS — BP 122/82 | HR 51 | Ht 71.0 in | Wt 298.6 lb

## 2023-04-14 DIAGNOSIS — I5020 Unspecified systolic (congestive) heart failure: Secondary | ICD-10-CM | POA: Diagnosis not present

## 2023-04-14 DIAGNOSIS — G4730 Sleep apnea, unspecified: Secondary | ICD-10-CM | POA: Insufficient documentation

## 2023-04-14 DIAGNOSIS — H3552 Pigmentary retinal dystrophy: Secondary | ICD-10-CM | POA: Insufficient documentation

## 2023-04-14 DIAGNOSIS — I2510 Atherosclerotic heart disease of native coronary artery without angina pectoris: Secondary | ICD-10-CM | POA: Diagnosis not present

## 2023-04-14 DIAGNOSIS — Z79899 Other long term (current) drug therapy: Secondary | ICD-10-CM | POA: Insufficient documentation

## 2023-04-14 DIAGNOSIS — I739 Peripheral vascular disease, unspecified: Secondary | ICD-10-CM | POA: Diagnosis not present

## 2023-04-14 DIAGNOSIS — I110 Hypertensive heart disease with heart failure: Secondary | ICD-10-CM | POA: Insufficient documentation

## 2023-04-14 DIAGNOSIS — E785 Hyperlipidemia, unspecified: Secondary | ICD-10-CM | POA: Insufficient documentation

## 2023-04-14 DIAGNOSIS — H548 Legal blindness, as defined in USA: Secondary | ICD-10-CM | POA: Insufficient documentation

## 2023-04-14 DIAGNOSIS — Z87891 Personal history of nicotine dependence: Secondary | ICD-10-CM | POA: Diagnosis not present

## 2023-04-14 DIAGNOSIS — I252 Old myocardial infarction: Secondary | ICD-10-CM | POA: Diagnosis not present

## 2023-04-14 DIAGNOSIS — Z6841 Body Mass Index (BMI) 40.0 and over, adult: Secondary | ICD-10-CM | POA: Diagnosis not present

## 2023-04-14 DIAGNOSIS — I5022 Chronic systolic (congestive) heart failure: Secondary | ICD-10-CM | POA: Insufficient documentation

## 2023-04-14 DIAGNOSIS — Z955 Presence of coronary angioplasty implant and graft: Secondary | ICD-10-CM | POA: Diagnosis not present

## 2023-04-14 LAB — BASIC METABOLIC PANEL
Anion gap: 11 (ref 5–15)
BUN: 15 mg/dL (ref 8–23)
CO2: 19 mmol/L — ABNORMAL LOW (ref 22–32)
Calcium: 8.5 mg/dL — ABNORMAL LOW (ref 8.9–10.3)
Chloride: 105 mmol/L (ref 98–111)
Creatinine, Ser: 1.05 mg/dL (ref 0.61–1.24)
GFR, Estimated: >60 mL/min (ref >60)
Glucose, Bld: 91 mg/dL (ref 70–99)
Potassium: 4.5 mmol/L (ref 3.5–5.1)
Sodium: 135 mmol/L (ref 135–145)

## 2023-04-14 LAB — BRAIN NATRIURETIC PEPTIDE: B Natriuretic Peptide: 320.9 pg/mL — ABNORMAL HIGH (ref 0.0–100.0)

## 2023-04-14 NOTE — Patient Instructions (Signed)
Medication Changes:  No Changes In Medications at this time.   Lab Work:  Labs done today, your results will be available in MyChart, we will contact you for abnormal readings.  Testing/Procedures:  Your physician has requested that you have an echocardiogram AS SCHEDULED  Echocardiography is a painless test that uses sound waves to create images of your heart. It provides your doctor with information about the size and shape of your heart and how well your heart's chambers and valves are working. This procedure takes approximately one hour. There are no restrictions for this procedure. Please do NOT wear cologne, perfume, aftershave, or lotions (deodorant is allowed). Please arrive 15 minutes prior to your appointment time.  Please note: We ask at that you not bring children with you during ultrasound (echo/ vascular) testing. Due to room size and safety concerns, children are not allowed in the ultrasound rooms during exams. Our front office staff cannot provide observation of children in our lobby area while testing is being conducted. An adult accompanying a patient to their appointment will only be allowed in the ultrasound room at the discretion of the ultrasound technician under special circumstances. We apologize for any inconvenience.   Referrals:  YOU HAVE BEEN REFERRED TO CARDIAC REHAB  THEY WILL REACH OUT TO YOU OR CALL TO ARRANGE THIS. PLEASE CALL us WITH ANY CONCERNS   YOU HAVE BEEN REFERRED TO GENERAL CARDIOLOGY THEY WILL REACH OUT TO YOU OR CALL TO ARRANGE THIS. PLEASE CALL us WITH ANY CONCERNS   Follow-Up in: 6 MONTHS PLEASE CALL OUR OFFICE AROUND MAY 2025 TO GET SCHEDULED FOR YOUR APPOINTMENT. PHONE NUMBER IS [phone] OPTION 2    At the [facility], you and your health needs are our priority. We have a designated team specialized in the treatment of Heart Failure. This Care Team includes your primary Heart Failure Specialized Cardiologist (physician),  Advanced Practice Providers (APPs- Physician Assistants and Nurse Practitioners), and Pharmacist who all work together to provide you with the care you need, when you need it.   You may see any of the following providers on your designated Care Team at your next follow up:  Dr. Arvilla Meres Dr. Marca Ancona Dr. Dorthula Nettles Dr. Theresia Bough Tonye Becket, NP Robbie Lis, [place] [facility] [place], [place] Brynda Peon, NP [place] Lee, NP Karle Plumber, PharmD   Please be sure to bring in all your medications bottles to every appointment.   Need to Contact us:  If you have any questions or concerns before your next appointment please send [place] a message through [place] or call our office at [phone].    TO LEAVE A MESSAGE FOR THE NURSE SELECT OPTION 2, PLEASE LEAVE A MESSAGE INCLUDING: YOUR NAME DATE OF BIRTH CALL BACK NUMBER REASON FOR CALL**this is important as we prioritize the call backs  YOU WILL RECEIVE A CALL BACK THE SAME DAY AS LONG AS YOU CALL BEFORE 4:00 PM

## 2023-04-15 ENCOUNTER — Encounter (HOSPITAL_BASED_OUTPATIENT_CLINIC_OR_DEPARTMENT_OTHER): Payer: Self-pay | Admitting: Family Medicine

## 2023-04-15 ENCOUNTER — Ambulatory Visit (INDEPENDENT_AMBULATORY_CARE_PROVIDER_SITE_OTHER): Payer: Medicare (Managed Care) | Admitting: Family Medicine

## 2023-04-15 VITALS — BP 118/64 | HR 50 | Ht 71.0 in | Wt 295.9 lb

## 2023-04-15 DIAGNOSIS — D72829 Elevated white blood cell count, unspecified: Secondary | ICD-10-CM | POA: Diagnosis not present

## 2023-04-15 DIAGNOSIS — Z0000 Encounter for general adult medical examination without abnormal findings: Secondary | ICD-10-CM | POA: Insufficient documentation

## 2023-04-15 DIAGNOSIS — R748 Abnormal levels of other serum enzymes: Secondary | ICD-10-CM | POA: Insufficient documentation

## 2023-04-15 NOTE — Assessment & Plan Note (Signed)
Chronic issue for patient, plan to recheck again in about 6 months for monitoring

## 2023-04-15 NOTE — Assessment & Plan Note (Signed)
Routine HCM labs reviewed. HCM reviewed/discussed. Anticipatory guidance regarding healthy weight, lifestyle and choices given. Recommend healthy diet.  Recommend approximately 150 minutes/week of moderate intensity exercise Recommend regular dental and vision exams Always use seatbelt/lap and shoulder restraints Recommend using smoke alarms and checking batteries at least twice a year Recommend using sunscreen when outside Discussed colon cancer screening recommendations, options.  Patient is UTD Discussed recommendations for shingles vaccine.  Patient will consider Discussed immunization recommendations

## 2023-04-15 NOTE — Progress Notes (Signed)
Subjective:    CC: Annual Physical Exam  HPI:  Jonathon Fortenberry is a 63 y.o. presenting for annual physical  I reviewed the past medical history, family history, social history, surgical history, and allergies today and no changes were needed.  Please see the problem list section below in epic for further details.  Past Medical History: Past Medical History:  Diagnosis Date   Allergy    Basal cell carcinoma 09/03/2020   sup & nod- Left upper arm-posterior (Cx35FU)   Bladder outlet obstruction    Cancer (HCC)    Skin   CHF (congestive heart failure) (HCC)    Coronary artery disease    Hyperlipidemia    Hypertension    Melanoma (HCC) 09/03/2020   in situ- Left upper back (EXC)   Retinitis pigmentosa    Past Surgical History: Past Surgical History:  Procedure Laterality Date   ABDOMINAL AORTOGRAM W/LOWER EXTREMITY N/A 03/19/2020   Procedure: ABDOMINAL AORTOGRAM W/LOWER EXTREMITY;  Surgeon: Elder Negus, MD;  Location: [facility];  Service: Cardiovascular;  Laterality: N/A;   CARDIAC CATHETERIZATION     CATARACT EXTRACTION Bilateral 2004   CORONARY BALLOON ANGIOPLASTY N/A 11/14/2019   Procedure: CORONARY BALLOON ANGIOPLASTY;  Surgeon: Elder Negus, MD;  Location: [facility];  Service: Cardiovascular;  Laterality: N/A;   CORONARY PRESSURE/FFR STUDY N/A 11/14/2019   Procedure: INTRAVASCULAR PRESSURE WIRE/FFR STUDY;  Surgeon: Elder Negus, MD;  Location: [facility];  Service: Cardiovascular;  Laterality: N/A;   CORONARY PRESSURE/FFR STUDY N/A 09/16/2021   Procedure: INTRAVASCULAR PRESSURE WIRE/FFR STUDY;  Surgeon: Elder Negus, MD;  Location: [facility];  Service: Cardiovascular;  Laterality: N/A;   CORONARY STENT INTERVENTION N/A 07/26/2019   Procedure: CORONARY STENT INTERVENTION;  Surgeon: Yates Decamp, MD;  Location: [facility];  Service: Cardiovascular;  Laterality: N/A;   CORONARY ULTRASOUND/IVUS N/A 11/14/2019    Procedure: Intravascular Ultrasound/IVUS;  Surgeon: Elder Negus, MD;  Location: [facility];  Service: Cardiovascular;  Laterality: N/A;   CORONARY/GRAFT ACUTE MI REVASCULARIZATION N/A 07/26/2019   Procedure: CORONARY/GRAFT ACUTE MI REVASCULARIZATION;  Surgeon: Yates Decamp, MD;  Location: [facility];  Service: Cardiovascular;  Laterality: N/A;   EYE SURGERY     LEFT HEART CATH AND CORONARY ANGIOGRAPHY N/A 07/26/2019   Procedure: LEFT HEART CATH AND CORONARY ANGIOGRAPHY;  Surgeon: Yates Decamp, MD;  Location: [facility];  Service: Cardiovascular;  Laterality: N/A;   LOWER EXTREMITY ANGIOGRAPHY Left 05/21/2020   Procedure: LOWER EXTREMITY ANGIOGRAPHY;  Surgeon: Elder Negus, MD;  [facility]: [facility];  Service: Cardiovascular;  Laterality: Left;   PERIPHERAL VASCULAR BALLOON ANGIOPLASTY Right 03/19/2020   Procedure: PERIPHERAL VASCULAR BALLOON ANGIOPLASTY;  Surgeon: Elder Negus, MD;  [facility]: [facility];  Service: Cardiovascular;  Laterality: Right;  SFA   PERIPHERAL VASCULAR BALLOON ANGIOPLASTY  05/21/2020   Procedure: PERIPHERAL VASCULAR BALLOON ANGIOPLASTY;  Surgeon: Elder Negus, MD;  [facility];  Service: Cardiovascular;;  left SFA   PERIPHERAL VASCULAR INTERVENTION Bilateral 03/19/2020   Procedure: PERIPHERAL VASCULAR INTERVENTION;  Surgeon: Elder Negus, MD;  [facility]: [facility];  Service: Cardiovascular;  Laterality: Bilateral;  External Iliac   RIGHT/LEFT HEART CATH AND CORONARY ANGIOGRAPHY N/A 09/16/2021   Procedure: RIGHT/LEFT HEART CATH AND CORONARY ANGIOGRAPHY;  Surgeon: Elder Negus, MD;  Location: [facility];  Service: Cardiovascular;  Laterality: N/A;   TUMOR REMOVAL  1961   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   Social History: Social History   Socioeconomic History   Marital status: Single    Spouse name: Not on file   Number of children: 0   Years of  education: 16   Highest education level: Bachelor's degree (e.g., BA, AB, BS)  Occupational History   Not on file  Tobacco Use   Smoking status: Former    Current packs/day: 0.00    Average packs/day: 1.5 packs/day for 40.0 years (60.0 ttl pk-yrs)    Types: Cigarettes    Start date: 07/26/1979    Quit date: 07/26/2019    Years since quitting: 3.7    Passive exposure: Past   Smokeless tobacco: Never  Vaping Use   Vaping status: Never Used  Substance and Sexual Activity   Alcohol use: Not Currently    Comment: occasional   Drug use: No   Sexual activity: Not Currently  Other Topics Concern   Not on file  Social History Narrative   Not on file   Social Drivers of Health   Financial Resource Strain: Low Risk  (05/26/2022)   Overall Financial Resource Strain (CARDIA)    Difficulty of Paying Living Expenses: Not hard at all  Food Insecurity: No Food Insecurity (05/26/2022)   Hunger Vital Sign    Worried About Running Out of Food in the Last Year: Never true    Ran Out of Food in the Last Year: Never true  Transportation Needs: Unmet Transportation Needs (02/03/2023)   PRAPARE - Administrator, Civil Service (Medical): Yes    Lack of Transportation (Non-Medical): No  Physical Activity: Inactive (05/26/2022)   Exercise Vital Sign    Days of Exercise per Week: 0 days    Minutes of Exercise per Session: 0 min  Stress: No Stress Concern Present (05/26/2022)   [employer] of Occupational Health - Occupational Stress Questionnaire    Feeling of Stress : Not at all  Social Connections: Moderately Integrated (05/26/2022)   Social Connection and Isolation Panel [NHANES]    Frequency of Communication with Friends and Family: More than three times a week    Frequency of Social Gatherings with Friends and Family: More than three times a week    Attends Religious Services: More than 4 times per year    Active Member of [employer] or Organizations: Yes    Attends Hospital doctor: More than 4 times per year    Marital Status: Never married   Family History: Family History  Problem Relation Age of Onset   Heart disease Mother    Hyperlipidemia Mother    Asthma Mother    Heart disease Father    Hypertension Father    Heart disease Maternal Grandmother    Heart disease Maternal Grandfather    Heart disease Paternal Grandmother    Heart disease Paternal Grandfather    Allergies: No Known Allergies Medications: See med rec.  Review of Systems: No headache, visual changes, nausea, vomiting, diarrhea, constipation, dizziness, abdominal pain, skin rash, fevers, chills, night sweats, swollen lymph nodes, weight loss, chest pain, body aches, joint swelling, muscle aches, shortness of breath, mood changes, visual or auditory hallucinations.  Objective:    BP 118/64 (BP Location: Right Arm, Patient Position: Sitting, Cuff Size: Normal)   Pulse (!) 50   Ht 5\' 11"  (1.803 m)   Wt 295 lb 14.4 oz (134.2 kg)   SpO2 97%   BMI 41.27 kg/m   General:  Well Developed, well nourished, and in no acute distress.  Neuro: Alert and oriented x3, extra-ocular muscles intact, sensation grossly intact. Cranial nerves II through XII are intact, motor, sensory, and coordinative functions are all intact. HEENT: Normocephalic, atraumatic, pupils equal round reactive to light, neck supple, no masses, no lymphadenopathy, thyroid nonpalpable. Oropharynx, nasopharynx, external ear canals are unremarkable. Skin: Warm and dry, no rashes noted.  Cardiac: Regular rate and rhythm, no murmurs rubs or gallops.  Respiratory: Clear to auscultation bilaterally. Not using accessory muscles, speaking in full sentences.  Abdominal: Soft, nontender, nondistended, positive bowel sounds, no masses, no organomegaly.  Musculoskeletal: Shoulder, elbow, wrist, hip, knee, ankle stable, and with full range of motion.  Impression and Recommendations:    Wellness examination Assessment & Plan: Routine  HCM labs reviewed. HCM reviewed/discussed. Anticipatory guidance regarding healthy weight, lifestyle and choices given. Recommend healthy diet.  Recommend approximately 150 minutes/week of moderate intensity exercise Recommend regular dental and vision exams Always use seatbelt/lap and shoulder restraints Recommend using smoke alarms and checking batteries at least twice a year Recommend using sunscreen when outside Discussed colon cancer screening recommendations, options.  Patient is UTD Discussed recommendations for shingles vaccine.  Patient will consider Discussed immunization recommendations   Leukocytosis, unspecified type Assessment & Plan: Chronic issue for patient, plan to recheck again in about 6 months for monitoring  Orders: -     CBC with Differential/Platelet; Future  Elevated alkaline phosphatase level Assessment & Plan: Noted on recent labs, has also been elevated in the past.  Current labs with slight increase from prior labs.  No obvious symptoms currently.  Will plan to recheck in about 6 months and can also plan to check a GGT to assess for gallbladder versus alternative source of elevation  Orders: -     Hepatic function panel; Future -     Gamma GT; Future  Return in about 6 months (around 10/13/2023).   ___________________________________________ Dana Debo de [place], MD, ABFM, CAQSM Primary Care and Sports Medicine [facility]

## 2023-04-15 NOTE — Patient Instructions (Signed)

## 2023-04-15 NOTE — Assessment & Plan Note (Signed)
Noted on recent labs, has also been elevated in the past.  Current labs with slight increase from prior labs.  No obvious symptoms currently.  Will plan to recheck in about 6 months and can also plan to check a GGT to assess for gallbladder versus alternative source of elevation

## 2023-04-21 ENCOUNTER — Telehealth (HOSPITAL_COMMUNITY): Payer: Self-pay

## 2023-04-21 NOTE — Telephone Encounter (Signed)
Called and spoke with pt in regards to CR, pt stated he is not able to participate at this time due to transportation.   Closed referral

## 2023-04-28 ENCOUNTER — Ambulatory Visit (HOSPITAL_COMMUNITY)
Admission: RE | Admit: 2023-04-28 | Discharge: 2023-04-28 | Disposition: A | Discharge status: Home / Self Care | Payer: Medicare (Managed Care) | Source: Ambulatory Visit | Attending: Cardiology | Admitting: Cardiology

## 2023-04-28 DIAGNOSIS — I2510 Atherosclerotic heart disease of native coronary artery without angina pectoris: Secondary | ICD-10-CM | POA: Insufficient documentation

## 2023-04-28 DIAGNOSIS — I739 Peripheral vascular disease, unspecified: Secondary | ICD-10-CM | POA: Diagnosis not present

## 2023-04-28 DIAGNOSIS — E669 Obesity, unspecified: Secondary | ICD-10-CM | POA: Diagnosis not present

## 2023-04-28 DIAGNOSIS — E785 Hyperlipidemia, unspecified: Secondary | ICD-10-CM | POA: Insufficient documentation

## 2023-04-28 DIAGNOSIS — Z006 Encounter for examination for normal comparison and control in clinical research program: Secondary | ICD-10-CM

## 2023-04-28 DIAGNOSIS — I5020 Unspecified systolic (congestive) heart failure: Secondary | ICD-10-CM | POA: Diagnosis present

## 2023-04-28 DIAGNOSIS — I340 Nonrheumatic mitral (valve) insufficiency: Secondary | ICD-10-CM | POA: Insufficient documentation

## 2023-04-28 DIAGNOSIS — I252 Old myocardial infarction: Secondary | ICD-10-CM | POA: Insufficient documentation

## 2023-04-28 DIAGNOSIS — R600 Localized edema: Secondary | ICD-10-CM | POA: Insufficient documentation

## 2023-04-28 LAB — ECHOCARDIOGRAM COMPLETE
AR max vel: 2.92 cm2
AV Area VTI: 2.48 cm2
AV Area mean vel: 2.41 cm2
AV Mean grad: 4 mm[Hg]
AV Peak grad: 8 mm[Hg]
Ao pk vel: 1.41 m/s
Area-P 1/2: 4.83 cm2
MV VTI: 1.99 cm2
S' Lateral: 5.1 cm

## 2023-04-28 MED ORDER — PERFLUTREN LIPID MICROSPHERE
1.0000 mL | INTRAVENOUS | Status: AC | PRN
  Administered 2023-04-28: 5 mL via INTRAVENOUS

## 2023-04-28 NOTE — Progress Notes (Signed)
*  PRELIMINARY RESULTS* Echocardiogram 2D Echocardiogram has been performed.  Silvana Drones 04/28/2023, 12:41 PM

## 2023-04-28 NOTE — Research (Signed)
 SITE: [id number]     Subject # [id number]   Subprotocol: A  Inclusion Criteria  Patients who meet all of the following criteria are eligible for enrollment as study participants:  Yes No  Age > 65 years old X   Eligible to wear Holter Study X    Exclusion Criteria  Patients who meet any of these criteria are not eligible for enrollment as study participants: Yes No  1. Receiving any mechanical (respiratory or circulatory) or renal support therapy at Screening or during Visit #1.  X  2.  Any other conditions that in the opinion of the investigators are likely to prevent compliance with the study protocol or pose a safety concern if the subject participates in the study.  X  3. Poor tolerance, namely susceptible to severe skin allergies from ECG adhesive patch application.  X   Protocol: REV H                                     Residential Zip code [address] (First 3 digits ONLY)                                             [facility] Informed Consent   Subject Name: Jonathon Cain  Subject met inclusion and exclusion criteria.  The informed consent form, study requirements and expectations were reviewed with the subject. Subject had opportunity to read consent and questions and concerns were addressed prior to the signing of the consent form.  The subject verbalized understanding of the trial requirements.  The subject agreed to participate in the [facility] EF ACT trial and signed the informed consent at 12:34 on 28-Apr-2023.  The informed consent was obtained prior to performance of any protocol-specific procedures for the subject.  A copy of the signed informed consent was given to the subject and a copy was placed in the subject's medical record.   Jonathon Cain          Current Outpatient Medications:    aspirin  EC 81 MG tablet, TAKE 1 TABLET BY MOUTH DAILY, Disp: 90 tablet, Rfl: 3   atorvastatin  (LIPITOR ) 80 MG tablet, TAKE 1 TABLET EVERY NIGHT AT BEDTIME, Disp: 90 tablet, Rfl: 3   cetirizine  (ZYRTEC) 10 MG tablet, Take 10 mg by mouth daily as needed for allergies., Disp: , Rfl:    empagliflozin  (JARDIANCE ) 10 MG TABS tablet, Take 1 tablet (10 mg total) by mouth daily before breakfast., Disp: 30 tablet, Rfl: 1   fluticasone (FLONASE) 50 MCG/ACT nasal spray, Place 2 sprays into both nostrils daily as needed for allergies or rhinitis. , Disp: , Rfl:    furosemide  (LASIX ) 40 MG tablet, TAKE 1 TABLET DAILY AS NEEDED FOR LEG SWELLING, Disp: 30 tablet, Rfl: 3   isosorbide  mononitrate (IMDUR ) 30 MG 24 hr tablet, TAKE 1/2 TABLET BY MOUTH DAILY, Disp: 30 tablet, Rfl: 8   metoprolol  succinate (TOPROL -XL) 25 MG 24 hr tablet, Take 0.5 tablets (12.5 mg total) by mouth at bedtime. Take with or immediately following a meal., Disp: 45 tablet, Rfl: 3   rivaroxaban  (XARELTO ) 2.5 MG TABS tablet, TAKE 1 TABLET BY MOUTH EVERY MORNING AND EVENING, Disp: 180 tablet, Rfl: 3   sacubitril -valsartan  (ENTRESTO ) 24-26 MG, TAKE 1 TABLET BY MOUTH TWICE DAILY, Disp: 180 tablet, Rfl: 3  Semaglutide -Weight Management (WEGOVY ) 0.25 MG/0.5ML SOAJ, Inject 0.25 mg into the skin once a week. FOR 4 WEEKS, Disp: 2 mL, Rfl: 0   Semaglutide -Weight Management (WEGOVY ) 0.5 MG/0.5ML SOAJ, Inject 0.5 mg into the skin once a week. FOR 4 WEEKS WHEN YOU FINISH THE 0.25 MG, Disp: 2 mL, Rfl: 0   spironolactone  (ALDACTONE ) 25 MG tablet, Take 1 tablet (25 mg total) by mouth as directed. 12.5 and 25 every other day, Disp: 90 tablet, Rfl: 3   tamsulosin  (FLOMAX ) 0.4 MG CAPS capsule, Take 1 capsule (0.4 mg total) by mouth daily as needed (urine flow)., Disp: 90 capsule, Rfl: 1 No current facility-administered medications for this visit.  Facility-Administered Medications Ordered in Other Visits:    perflutren  lipid microspheres (DEFINITY ) IV suspension, 1-10 mL, Intravenous, PRN, Sabharwal, Aditya, DO, 5 mL at 04/28/23 1242

## 2023-05-25 ENCOUNTER — Other Ambulatory Visit: Payer: Self-pay | Admitting: Cardiology

## 2023-06-01 ENCOUNTER — Encounter (HOSPITAL_BASED_OUTPATIENT_CLINIC_OR_DEPARTMENT_OTHER): Payer: Self-pay

## 2023-06-01 ENCOUNTER — Ambulatory Visit (HOSPITAL_BASED_OUTPATIENT_CLINIC_OR_DEPARTMENT_OTHER): Payer: Medicare HMO | Admitting: *Deleted

## 2023-06-01 DIAGNOSIS — Z0000 Encounter for general adult medical examination without abnormal findings: Secondary | ICD-10-CM | POA: Diagnosis not present

## 2023-06-01 DIAGNOSIS — Z1211 Encounter for screening for malignant neoplasm of colon: Secondary | ICD-10-CM

## 2023-06-01 NOTE — Progress Notes (Signed)
 Subjective:   Jonathon Cain is a 63 y.o. male who presents for Medicare Annual/Subsequent preventive examination.  Visit Complete: Virtual I connected with  Jonathon Cain on 06/01/23 by a audio enabled telemedicine application and verified that I am speaking with the correct person using two identifiers.  Patient Location: Home  Provider Location: Home Office  I discussed the limitations of evaluation and management by telemedicine. The patient expressed understanding and agreed to proceed.  Vital Signs: Because this visit was a virtual/telehealth visit, some criteria may be missing or patient reported. Any vitals not documented were not able to be obtained and vitals that have been documented are patient reported.  Patient Medicare AWV questionnaire was completed by the patient on ; I have confirmed that all information answered by patient is correct and no changes since this date.  Cardiac Risk Factors include: advanced age (>76men, >41 women);male gender;obesity (BMI >30kg/m2)     Objective:    There were no vitals filed for this visit. There is no height or weight on file to calculate BMI.     06/01/2023   10:54 AM 09/16/2021   10:47 AM 02/20/2021    2:01 PM 05/21/2020    6:05 AM 03/19/2020    7:11 AM 11/14/2019   10:34 AM 10/25/2019    2:20 PM  Advanced Directives  Does Patient Have a Medical Advance Directive? No No No No No Yes No  Type of Careers adviser;Living will   Does patient want to make changes to medical advance directive?      No - Patient declined   Copy of Healthcare Power of Attorney in Chart?      No - copy requested   Would patient like information on creating a medical advance directive? No - Patient declined No - Patient declined No - Patient declined No - Patient declined No - Patient declined      Current Medications (verified) Outpatient Encounter Medications as of 06/01/2023  Medication Sig   aspirin EC 81 MG  tablet TAKE 1 TABLET BY MOUTH DAILY   atorvastatin (LIPITOR) 80 MG tablet TAKE 1 TABLET EVERY NIGHT AT BEDTIME   cetirizine (ZYRTEC) 10 MG tablet Take 10 mg by mouth daily as needed for allergies.   empagliflozin (JARDIANCE) 10 MG TABS tablet TAKE 1 TABLET(10 MG) BY MOUTH DAILY BEFORE BREAKFAST   fluticasone (FLONASE) 50 MCG/ACT nasal spray Place 2 sprays into both nostrils daily as needed for allergies or rhinitis.    furosemide (LASIX) 40 MG tablet TAKE 1 TABLET DAILY AS NEEDED FOR LEG SWELLING   isosorbide mononitrate (IMDUR) 30 MG 24 hr tablet TAKE 1/2 TABLET BY MOUTH DAILY   metoprolol succinate (TOPROL-XL) 25 MG 24 hr tablet Take 0.5 tablets (12.5 mg total) by mouth at bedtime. Take with or immediately following a meal.   rivaroxaban (XARELTO) 2.5 MG TABS tablet TAKE 1 TABLET BY MOUTH EVERY MORNING AND EVENING   sacubitril-valsartan (ENTRESTO) 24-26 MG TAKE 1 TABLET BY MOUTH TWICE DAILY   tamsulosin (FLOMAX) 0.4 MG CAPS capsule Take 1 capsule (0.4 mg total) by mouth daily as needed (urine flow).   Semaglutide-Weight Management (WEGOVY) 0.25 MG/0.5ML SOAJ Inject 0.25 mg into the skin once a week. FOR 4 WEEKS   Semaglutide-Weight Management (WEGOVY) 0.5 MG/0.5ML SOAJ Inject 0.5 mg into the skin once a week. FOR 4 WEEKS WHEN YOU FINISH THE 0.25 MG   spironolactone (ALDACTONE) 25 MG tablet Take 1 tablet (  25 mg total) by mouth as directed. 12.5 and 25 every other day   No facility-administered encounter medications on file as of 06/01/2023.    Allergies (verified) Patient has no known allergies.   History: Past Medical History:  Diagnosis Date   Allergy    Basal cell carcinoma 09/03/2020   sup & nod- Left upper arm-posterior ([id number])   Bladder outlet obstruction    Cancer (HCC)    Skin   CHF (congestive heart failure) (HCC)    Coronary artery disease    Hyperlipidemia    Hypertension    Melanoma (HCC) 09/03/2020   in situ- Left upper back (EXC)   Retinitis pigmentosa    Past  Surgical History:  Procedure Laterality Date   ABDOMINAL AORTOGRAM W/LOWER EXTREMITY N/A 03/19/2020   Procedure: ABDOMINAL AORTOGRAM W/LOWER EXTREMITY;  Surgeon: Elder Negus, MD;  Location: [facility];  Service: Cardiovascular;  Laterality: N/A;   CARDIAC CATHETERIZATION     CATARACT EXTRACTION Bilateral 2004   CORONARY BALLOON ANGIOPLASTY N/A 11/14/2019   Procedure: CORONARY BALLOON ANGIOPLASTY;  Surgeon: Elder Negus, MD;  Location: [facility];  Service: Cardiovascular;  Laterality: N/A;   CORONARY PRESSURE/FFR STUDY N/A 11/14/2019   Procedure: INTRAVASCULAR PRESSURE WIRE/FFR STUDY;  Surgeon: Elder Negus, MD;  Location: [facility];  Service: Cardiovascular;  Laterality: N/A;   CORONARY PRESSURE/FFR STUDY N/A 09/16/2021   Procedure: INTRAVASCULAR PRESSURE WIRE/FFR STUDY;  Surgeon: Elder Negus, MD;  Location: [facility];  Service: Cardiovascular;  Laterality: N/A;   CORONARY STENT INTERVENTION N/A 07/26/2019   Procedure: CORONARY STENT INTERVENTION;  Surgeon: Yates Decamp, MD;  Location: [facility];  Service: Cardiovascular;  Laterality: N/A;   CORONARY ULTRASOUND/IVUS N/A 11/14/2019   Procedure: Intravascular Ultrasound/IVUS;  Surgeon: Elder Negus, MD;  [facility]: [facility];  Service: Cardiovascular;  Laterality: N/A;   CORONARY/GRAFT ACUTE MI REVASCULARIZATION N/A 07/26/2019   Procedure: CORONARY/GRAFT ACUTE MI REVASCULARIZATION;  Surgeon: Yates Decamp, MD;  [facility]: [facility];  Service: Cardiovascular;  Laterality: N/A;   EYE SURGERY     LEFT HEART CATH AND CORONARY ANGIOGRAPHY N/A 07/26/2019   Procedure: LEFT HEART CATH AND CORONARY ANGIOGRAPHY;  Surgeon: Yates Decamp, MD;  [facility]: [facility];  Service: Cardiovascular;  Laterality: N/A;   LOWER EXTREMITY ANGIOGRAPHY Left 05/21/2020   Procedure: LOWER EXTREMITY ANGIOGRAPHY;  Surgeon: Elder Negus, MD;  [facility];  Service:  Cardiovascular;  Laterality: Left;   PERIPHERAL VASCULAR BALLOON ANGIOPLASTY Right 03/19/2020   Procedure: PERIPHERAL VASCULAR BALLOON ANGIOPLASTY;  Surgeon: Elder Negus, MD;  [facility];  Service: Cardiovascular;  Laterality: Right;  SFA   PERIPHERAL VASCULAR BALLOON ANGIOPLASTY  05/21/2020   Procedure: PERIPHERAL VASCULAR BALLOON ANGIOPLASTY;  Surgeon: Elder Negus, MD;  [facility];  Service: Cardiovascular;;  left SFA   PERIPHERAL VASCULAR INTERVENTION Bilateral 03/19/2020   Procedure: PERIPHERAL VASCULAR INTERVENTION;  Surgeon: Elder Negus, MD;  [facility];  Service: Cardiovascular;  Laterality: Bilateral;  External Iliac   RIGHT/LEFT HEART CATH AND CORONARY ANGIOGRAPHY N/A 09/16/2021   Procedure: RIGHT/LEFT HEART CATH AND CORONARY ANGIOGRAPHY;  Surgeon: Elder Negus, MD;  [facility];  Service: Cardiovascular;  Laterality: N/A;   TUMOR REMOVAL  1961   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   Family History  Problem Relation Age of Onset   Heart disease Mother  Hyperlipidemia Mother    Asthma Mother    Heart disease Father    Hypertension Father    Heart disease Maternal Grandmother    Heart disease Maternal Grandfather    Heart disease Paternal Grandmother    Heart disease Paternal Grandfather    Social History   Socioeconomic History   Marital status: Single    Spouse name: Not on file   Number of children: 0   Years of education: 16   Highest education level: Bachelor's degree (e.g., BA, AB, BS)  Occupational History   Not on file  Tobacco Use   Smoking status: Former    Current packs/day: 0.00    Average packs/day: 1.5 packs/day for 40.0 years (60.0 ttl pk-yrs)    Types: Cigarettes    Start date: 07/26/1979    Quit date: 07/26/2019    Years since quitting: 3.8    Passive exposure: Past   Smokeless tobacco: Never  Vaping Use   Vaping status: Never Used  Substance  and Sexual Activity   Alcohol use: Not Currently    Comment: occasional   Drug use: No   Sexual activity: Not Currently  Other Topics Concern   Not on file  Social History Narrative   Not on file   Social Drivers of Health   Financial Resource Strain: Low Risk  (06/01/2023)   Overall Financial Resource Strain (CARDIA)    Difficulty of Paying Living Expenses: Not hard at all  Food Insecurity: No Food Insecurity (06/01/2023)   Hunger Vital Sign    Worried About Running Out of Food in the Last Year: Never true    Ran Out of Food in the Last Year: Never true  Transportation Needs: Unmet Transportation Needs (06/01/2023)   PRAPARE - Administrator, Civil Service (Medical): Yes    Lack of Transportation (Non-Medical): No  Physical Activity: Inactive (05/26/2022)   Exercise Vital Sign    Days of Exercise per Week: 0 days    Minutes of Exercise per Session: 0 min  Stress: No Stress Concern Present (06/01/2023)   [employer] of Occupational Health - Occupational Stress Questionnaire    Feeling of Stress : Not at all  Social Connections: Unknown (06/01/2023)   Social Connection and Isolation Panel [NHANES]    Frequency of Communication with Friends and Family: More than three times a week    Frequency of Social Gatherings with Friends and Family: Once a week    Attends Religious Services: Never    Database administrator or Organizations: No    Attends Engineer, structural: Never    Marital Status: Not on file    Tobacco Counseling Counseling given: Not Answered   Clinical Intake:  Pre-visit preparation completed: Yes  Pain : No/denies pain     Diabetes: No  How often do you need to have someone help you when you read instructions, pamphlets, or other written materials from your doctor or pharmacy?: 1 - Never  Interpreter Needed?: No  Information entered by :: Remi Haggard LPN   Activities of Daily Living    06/01/2023   10:59 AM  In your present  state of health, do you have any difficulty performing the following activities:  Hearing? 0  Vision? 0  Difficulty concentrating or making decisions? 0  Walking or climbing stairs? 1  Dressing or bathing? 0  Doing errands, shopping? 0  Preparing Food and eating ? N  Using the Toilet? N  In the past six months,  have you accidently leaked urine? Y  Do you have problems with loss of bowel control? N  Managing your Medications? N  Managing your Finances? N  Housekeeping or managing your Housekeeping? N    Patient Care Team: [place], Buren Kos, MD as PCP - General (Family Medicine) Elder Negus, MD as PCP - Cardiology (Cardiology) Elder Negus, MD as Consulting Physician (Cardiology) Berlinda Last, MD as Referring Physician (Ophthalmology) Janalyn Harder, MD (Inactive) as Consulting Physician (Dermatology) Erroll Luna, [place] (Inactive) (Pharmacist)  Indicate any recent Medical Services you may have received from other than [facility] providers in the past year (date may be approximate).     Assessment:   This is a routine wellness examination for Shamal.  Hearing/Vision screen Hearing Screening - Comments:: No trouble hearing Vision Screening - Comments:: Up to date [facility]   Goals Addressed             This Visit's Progress    Patient Stated       Continue  current lifestyle       Depression Screen    06/01/2023   11:02 AM 04/15/2023    2:20 PM 10/12/2022    1:21 PM 05/26/2022   11:03 AM 04/13/2022    1:49 PM 02/25/2022    1:37 PM 06/16/2021    3:02 PM  PHQ 2/9 Scores  PHQ - 2 Score 0 0 0 0 2 0 0  PHQ- 9 Score 6 0 0 0 9 0 0  Exception Documentation   Medical reason  Medical reason Medical reason     Fall Risk    06/01/2023   10:54 AM 06/01/2023   10:53 AM 04/15/2023    2:20 PM 10/12/2022    1:21 PM 05/26/2022   11:07 AM  Fall Risk   Falls in the past year? 0 0 0 0 0  Number falls in past yr: 0  0 0 0  Injury with Fall? 0  0 0 0   Risk for fall due to : Impaired balance/gait  No Fall Risks No Fall Risks No Fall Risks  Follow up Falls evaluation completed;Education provided;Falls prevention discussed  Falls evaluation completed Falls evaluation completed Falls prevention discussed    MEDICARE RISK AT HOME: Medicare Risk at Home Any stairs in or around the home?: No If so, are there any without handrails?: No Home free of loose throw rugs in walkways, pet beds, electrical cords, etc?: Yes Adequate lighting in your home to reduce risk of falls?: Yes Life alert?: No Use of a cane, walker or w/c?: Yes Grab bars in the bathroom?: No Shower chair or bench in shower?: No Elevated toilet seat or a handicapped toilet?: No  TIMED UP AND GO:  Was the test performed?  No    Cognitive Function:        06/01/2023   11:00 AM 05/26/2022   11:07 AM  6CIT Screen  What Year? 0 points 0 points  What month? 0 points 0 points  What time? 0 points 0 points  Count back from 20 0 points 0 points  Months in reverse 0 points 0 points  Repeat phrase 0 points 0 points  Total Score 0 points 0 points    Immunizations Immunization History  Administered Date(s) Administered   Influenza,inj,Quad PF,6+ Mos 12/03/2020, 04/13/2022   PFIZER(Purple Top)SARS-COV-2 Vaccination 12/14/2019, 01/04/2020   PNEUMOCOCCAL CONJUGATE-20 12/03/2020    TDAP status: Due, Education has been provided regarding the importance of this vaccine. Advised  may receive this vaccine at local pharmacy or Health Dept. Aware to provide a copy of the vaccination record if obtained from local pharmacy or Health Dept. Verbalized acceptance and understanding.  Flu Vaccine status: Due, Education has been provided regarding the importance of this vaccine. Advised may receive this vaccine at local pharmacy or Health Dept. Aware to provide a copy of the vaccination record if obtained from local pharmacy or Health Dept. Verbalized acceptance and  understanding.  Pneumococcal vaccine status: Up to date  Covid-19 vaccine status: Information provided on how to obtain vaccines.   Qualifies for Shingles Vaccine? Yes   Zostavax completed No   Shingrix Completed?: No.    Education has been provided regarding the importance of this vaccine. Patient has been advised to call insurance company to determine out of pocket expense if they have not yet received this vaccine. Advised may also receive vaccine at local pharmacy or Health Dept. Verbalized acceptance and understanding.  Screening Tests Health Maintenance  Topic Date Due   HIV Screening  Never done   Hepatitis C Screening  Never done   Zoster Vaccines- Shingrix (1 of 2) Never done   COVID-19 Vaccine (3 - Pfizer risk series) 02/01/2020   INFLUENZA VACCINE  06/21/2023 (Originally 10/22/2022)   Lung Cancer Screening  01/05/2024   Medicare Annual Wellness (AWV)  05/31/2024   Colonoscopy  11/20/2028   Pneumococcal Vaccine 76-70 Years old  Completed   HPV VACCINES  Aged Out   DTaP/Tdap/Td  Discontinued    Health Maintenance  Health Maintenance Due  Topic Date Due   HIV Screening  Never done   Hepatitis C Screening  Never done   Zoster Vaccines- Shingrix (1 of 2) Never done   COVID-19 Vaccine (3 - Pfizer risk series) 02/01/2020    Colorectal cancer screening: Referral to GI placed  . Pt aware the office will call re: appt.  Lung Cancer Screening: (Low Dose CT Chest recommended if Age 50-80 years, 20 pack-year currently smoking OR have quit w/in 15years.) does qualify.   Lung Cancer Screening Referral: due in 12-2023  Additional Screening:  Hepatitis C Screening:  never done  Vision Screening: Recommended annual ophthalmology exams for early detection of glaucoma and other disorders of the eye. Is the patient up to date with their annual eye exam?  Yes  Who is the provider or what is the name of the office in which the patient attends annual eye exams? [facility] If pt  is not established with a provider, would they like to be referred to a provider to establish care? No .   Dental Screening: Recommended annual dental exams for proper oral hygiene    Community Resource Referral / Chronic Care Management: CRR required this visit?  No   CCM required this visit?  No     Plan:     I have personally reviewed and noted the following in the patient's chart:   Medical and social history Use of alcohol, tobacco or illicit drugs  Current medications and supplements including opioid prescriptions. Patient is not currently taking opioid prescriptions. Functional ability and status Nutritional status Physical activity Advanced directives List of other physicians Hospitalizations, surgeries, and ER visits in previous 12 months Vitals Screenings to include cognitive, depression, and falls Referrals and appointments  In addition, I have reviewed and discussed with patient certain preventive protocols, quality metrics, and best practice recommendations. A written personalized care plan for preventive services as well as general preventive health recommendations were  provided to patient.     Remi Haggard, LPN   [id number]   After Visit Summary: (MyChart) Due to this being a telephonic visit, the after visit summary with patients personalized plan was offered to patient via MyChart   Nurse Notes:

## 2023-06-01 NOTE — Patient Instructions (Signed)
 Jonathon Cain , Thank you for taking time to come for your Medicare Wellness Visit. I appreciate your ongoing commitment to your health goals. Please review the following plan we discussed and let me know if I can assist you in the future.   Screening recommendations/referrals: Colonoscopy: Education provided Recommended yearly ophthalmology/optometry visit for glaucoma screening and checkup Recommended yearly dental visit for hygiene and checkup  Vaccinations: Influenza vaccine: Education provided Pneumococcal vaccine: up to date Tdap vaccine: Education provided Shingles vaccine: Education provided       Preventive Care 65 Years and Older, Male Preventive care refers to lifestyle choices and visits with your health care provider that can promote health and wellness. What does preventive care include? A yearly physical exam. This is also called an annual well check. Dental exams once or twice a year. Routine eye exams. Ask your health care provider how often you should have your eyes checked. Personal lifestyle choices, including: Daily care of your teeth and gums. Regular physical activity. Eating a healthy diet. Avoiding tobacco and drug use. Limiting alcohol use. Practicing safe sex. Taking low doses of aspirin every day. Taking vitamin and mineral supplements as recommended by your health care provider. What happens during an annual well check? The services and screenings done by your health care provider during your annual well check will depend on your age, overall health, lifestyle risk factors, and family history of disease. Counseling  Your health care provider may ask you questions about your: Alcohol use. Tobacco use. Drug use. Emotional well-being. Home and relationship well-being. Sexual activity. Eating habits. History of falls. Memory and ability to understand (cognition). Work and work Astronomer. Screening  You may have the following tests or  measurements: Height, weight, and BMI. Blood pressure. Lipid and cholesterol levels. These may be checked every 5 years, or more frequently if you are over 34 years old. Skin check. Lung cancer screening. You may have this screening every year starting at age 39 if you have a 30-pack-year history of smoking and currently smoke or have quit within the past 15 years. Fecal occult blood test (FOBT) of the stool. You may have this test every year starting at age 24. Flexible sigmoidoscopy or colonoscopy. You may have a sigmoidoscopy every 5 years or a colonoscopy every 10 years starting at age 61. Prostate cancer screening. Recommendations will vary depending on your family history and other risks. Hepatitis C blood test. Hepatitis B blood test. Sexually transmitted disease (STD) testing. Diabetes screening. This is done by checking your blood sugar (glucose) after you have not eaten for a while (fasting). You may have this done every 1-3 years. Abdominal aortic aneurysm (AAA) screening. You may need this if you are a current or former smoker. Osteoporosis. You may be screened starting at age 37 if you are at high risk. Talk with your health care provider about your test results, treatment options, and if necessary, the need for more tests. Vaccines  Your health care provider may recommend certain vaccines, such as: Influenza vaccine. This is recommended every year. Tetanus, diphtheria, and acellular pertussis (Tdap, Td) vaccine. You may need a Td booster every 10 years. Zoster vaccine. You may need this after age 75. Pneumococcal 13-valent conjugate (PCV13) vaccine. One dose is recommended after age 61. Pneumococcal polysaccharide (PPSV23) vaccine. One dose is recommended after age 74. Talk to your health care provider about which screenings and vaccines you need and how often you need them. This information is not intended to replace advice  given to you by your health care provider. Make sure  you discuss any questions you have with your health care provider. Document Released: 04/05/2015 Document Revised: 11/27/2015 Document Reviewed: 01/08/2015 Elsevier Interactive Patient Education  2017 [employer].  Fall Prevention in the Home Falls can cause injuries. They can happen to people of all ages. There are many things you can do to make your home safe and to help prevent falls. What can I do on the outside of my home? Regularly fix the edges of walkways and driveways and fix any cracks. Remove anything that might make you trip as you walk through a door, such as a raised step or threshold. Trim any bushes or trees on the path to your home. Use bright outdoor lighting. Clear any walking paths of anything that might make someone trip, such as rocks or tools. Regularly check to see if handrails are loose or broken. Make sure that both sides of any steps have handrails. Any raised decks and porches should have guardrails on the edges. Have any leaves, snow, or ice cleared regularly. Use sand or salt on walking paths during winter. Clean up any spills in your garage right away. This includes oil or grease spills. What can I do in the bathroom? Use night lights. Install grab bars by the toilet and in the tub and shower. Do not use towel bars as grab bars. Use non-skid mats or decals in the tub or shower. If you need to sit down in the shower, use a plastic, non-slip stool. Keep the floor dry. Clean up any water that spills on the floor as soon as it happens. Remove soap buildup in the tub or shower regularly. Attach bath mats securely with double-sided non-slip rug tape. Do not have throw rugs and other things on the floor that can make you trip. What can I do in the bedroom? Use night lights. Make sure that you have a light by your bed that is easy to reach. Do not use any sheets or blankets that are too big for your bed. They should not hang down onto the floor. Have a firm  chair that has side arms. You can use this for support while you get dressed. Do not have throw rugs and other things on the floor that can make you trip. What can I do in the kitchen? Clean up any spills right away. Avoid walking on wet floors. Keep items that you use a lot in easy-to-reach places. If you need to reach something above you, use a strong step stool that has a grab bar. Keep electrical cords out of the way. Do not use floor polish or wax that makes floors slippery. If you must use wax, use non-skid floor wax. Do not have throw rugs and other things on the floor that can make you trip. What can I do with my stairs? Do not leave any items on the stairs. Make sure that there are handrails on both sides of the stairs and use them. Fix handrails that are broken or loose. Make sure that handrails are as long as the stairways. Check any carpeting to make sure that it is firmly attached to the stairs. Fix any carpet that is loose or worn. Avoid having throw rugs at the top or bottom of the stairs. If you do have throw rugs, attach them to the floor with carpet tape. Make sure that you have a light switch at the top of the stairs and the bottom of  the stairs. If you do not have them, ask someone to add them for you. What else can I do to help prevent falls? Wear shoes that: Do not have high heels. Have rubber bottoms. Are comfortable and fit you well. Are closed at the toe. Do not wear sandals. If you use a stepladder: Make sure that it is fully opened. Do not climb a closed stepladder. Make sure that both sides of the stepladder are locked into place. Ask someone to hold it for you, if possible. Clearly mark and make sure that you can see: Any grab bars or handrails. First and last steps. Where the edge of each step is. Use tools that help you move around (mobility aids) if they are needed. These include: Canes. Walkers. Scooters. Crutches. Turn on the lights when you go  into a dark area. Replace any light bulbs as soon as they burn out. Set up your furniture so you have a clear path. Avoid moving your furniture around. If any of your floors are uneven, fix them. If there are any pets around you, be aware of where they are. Review your medicines with your doctor. Some medicines can make you feel dizzy. This can increase your chance of falling. Ask your doctor what other things that you can do to help prevent falls. This information is not intended to replace advice given to you by your health care provider. Make sure you discuss any questions you have with your health care provider. Document Released: 01/03/2009 Document Revised: 08/15/2015 Document Reviewed: 04/13/2014 Elsevier Interactive Patient Education  2017 [employer].

## 2023-06-07 NOTE — Addendum Note (Signed)
 Encounter addended by: Howell Rucks, RDCS on: 06/07/2023 7:11 AM  Actions taken: Imaging Exam ended

## 2023-06-10 ENCOUNTER — Other Ambulatory Visit: Payer: Self-pay | Admitting: Cardiology

## 2023-06-10 ENCOUNTER — Other Ambulatory Visit: Payer: Self-pay | Admitting: Physician Assistant

## 2023-06-10 DIAGNOSIS — I5020 Unspecified systolic (congestive) heart failure: Secondary | ICD-10-CM

## 2023-06-10 NOTE — Telephone Encounter (Signed)
 This is a CHF pt

## 2023-06-24 ENCOUNTER — Ambulatory Visit: Payer: Medicare (Managed Care) | Attending: Cardiology | Admitting: Cardiology

## 2023-06-24 ENCOUNTER — Encounter: Payer: Self-pay | Admitting: Cardiology

## 2023-06-24 VITALS — BP 112/60 | HR 69 | Resp 16 | Ht 71.0 in | Wt 296.2 lb

## 2023-06-24 DIAGNOSIS — I5020 Unspecified systolic (congestive) heart failure: Secondary | ICD-10-CM | POA: Diagnosis not present

## 2023-06-24 DIAGNOSIS — I739 Peripheral vascular disease, unspecified: Secondary | ICD-10-CM | POA: Diagnosis not present

## 2023-06-24 DIAGNOSIS — I25118 Atherosclerotic heart disease of native coronary artery with other forms of angina pectoris: Secondary | ICD-10-CM | POA: Diagnosis not present

## 2023-06-24 MED ORDER — ENTRESTO 49-51 MG PO TABS: 1.0000 | ORAL_TABLET | Freq: Two times a day (BID) | ORAL | 2 refills | Status: AC

## 2023-06-24 NOTE — Progress Notes (Signed)
 Cardiology Office Note:  .   Date:  06/24/2023  ID:  Jonathon Cain, DOB 1961/01/20, MRN [id number] PCP: de Peru, Raymond J, MD  [facility] Providers Cardiologist:  Truett Mainland, MD PCP: de Peru, Buren Kos, MD  Chief Complaint  Patient presents with   heart failure with reduced ejection fraction   Follow-up     Jonathon Cain is a 63 y.o. 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   Patient was seen by heart failure specialist Dr. Gasper Lloyd in 01/2023 and 03/2023. He was deemed not to be a candidate for advanced HF therapies due to blindness.  He is tolerating GDMT much better after changing the time of administration.  He still has shortness of breath with walking to the mailbox and back.     Vitals:   06/24/23 1038  BP: 112/60  Pulse: 69  Resp: 16  SpO2: 95%      Review of Systems  Cardiovascular:  Positive for dyspnea on exertion. Negative for chest pain, leg swelling, palpitations and syncope.        Studies Reviewed: [name] Kitchen         Independently interpreted 03/2023: Chol 115, TG 121, HDL 33, LDL 60 HbA1C 5.6% Hb 15.3 Cr 1.05 BNP 320 TSH 3.5  Independently interpreted Echocardiogram 04/28/2023: Mild LVH. EF 20-25%. Grade III DD. Mild RV enlargement. Mild RV systolic dysfunction. Mild MR.  Mobile cardiac telemetry 14 days 11/03/2022 - 11/17/2022: Dominant rhythm: Sinus. HR 38-134 bpm. Avg HR 59 bpm, in sinus rhythm. 77 episodes of SVT/atrial tachycardia, fastest at 133 bpm for 4 beats, longest for 11.5 secs at 114 bpm. Some beats were conducted aberrantly.  <1% isolated SVE. 4 episodes of VT, fastest at 200 bpm for 4 beats, longest for 8 beats at 137 bpm. 1.9% isolated VE, <1% couplet/triplets. No atrial fibrillation/atrial flutter/high grade AV  block, sinus pause >3sec noted. 0 patient triggered events.      Physical Exam Vitals and nursing note reviewed.  Constitutional:      General: He is not in acute distress. Neck:     Vascular: No JVD.  Cardiovascular:     Rate and Rhythm: Normal rate and regular rhythm.     Heart sounds: Normal heart sounds. No murmur heard. Pulmonary:     Effort: Pulmonary effort is normal.     Breath sounds: Normal breath sounds. No wheezing or rales.  Musculoskeletal:     Right lower leg: No edema.     Left lower leg: No edema.      VISIT DIAGNOSES: No diagnosis found.   Jonathon Cain is a 63 y.o. 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   Assessment & Plan  CAD: Cath 08/2021 showed patent LAD stent, moderate circumflex/RCA disease with negative FFR Continue aspirin, statin, Imdur 15 mg daily, metoprolol succiante 25 mg daily.   Ischemic cardiomyopathy: EF 20-25% (echocardiogram 04/2023) Nonobstructive CAD, patent LAD stent (08/2021) NYHA class II symptoms. Currently on spironolactone 25 mg daily, Entresto to 24-26 mg bid, Jardiance 10 mg daily, metoprolol succinate 25 mg daily, lasix 40 mg daily. Increase Entresto to 49-51 mg bid. Check BMP and BNP in 1 week.  F/u in 4 weeks to consider  further up titration of Entresto. Will plan to get echocardiogram in 09/2023.  If EF remains low, could consider referral to EP for ICD.  PAD: Successful intervention to b/l CIA and Rt SFA for severe caludication (02/2020) Successful intervention to Lt SFA ABI 0.8 b/l. Residual mild below the knee disease Continue Aspirin 81 mg daily, Xarelto 2.5 mg bid    Meds ordered this encounter  Medications   sacubitril-valsartan (ENTRESTO) 49-51 MG    Sig: Take 1 tablet by mouth 2 (two) times daily.     Dispense:  180 tablet    Refill:  2    F/u in 4 weeks  Signed, Elder Negus, MD

## 2023-06-24 NOTE — Patient Instructions (Signed)
 Medication Instructions:  STOP Entresto 24-26 mg   START Entresto 49-51 mg take one tablet by mouth twice daily   *If you need a refill on your cardiac medications before your next appointment, please call your pharmacy*  Lab Work IN 1 WEEK: BMP BNP  If you have labs (blood work) drawn today and your tests are completely normal, you will receive your results only by: MyChart Message (if you have MyChart) OR A paper copy in the mail If you have any lab test that is abnormal or we need to change your treatment, we will call you to review the results.  Testing/Procedures: ECHO IN JULY 2025  Your physician has requested that you have an echocardiogram. Echocardiography is a painless test that uses sound waves to create images of your heart. It provides your doctor with information about the size and shape of your heart and how well your heart's chambers and valves are working. This procedure takes approximately one hour. There are no restrictions for this procedure. Please do NOT wear cologne, perfume, aftershave, or lotions (deodorant is allowed). Please arrive 15 minutes prior to your appointment time.  Please note: We ask at that you not bring children with you during ultrasound (echo/ vascular) testing. Due to room size and safety concerns, children are not allowed in the ultrasound rooms during exams. Our front office staff cannot provide observation of children in our lobby area while testing is being conducted. An adult accompanying a patient to their appointment will only be allowed in the ultrasound room at the discretion of the ultrasound technician under special circumstances. We apologize for any inconvenience.   Follow-Up: At [facility], you and your health needs are our priority.  As part of our continuing mission to provide you with exceptional heart care, our providers are all part of one team.  This team includes your primary Cardiologist (physician) and Advanced  Practice Providers or APPs (Physician Assistants and Nurse Practitioners) who all work together to provide you with the care you need, when you need it.  Your next appointment:   4-6 WEEKS   Provider:   Elder Negus, MD     Other Instructions       1st Floor: - Lobby - Registration  - Pharmacy  - Lab - Cafe  2nd Floor: - PV Lab - Diagnostic Testing (echo, CT, nuclear med)  3rd Floor: - Vacant  4th Floor: - TCTS (cardiothoracic surgery) - AFib Clinic - Structural Heart Clinic - Vascular Surgery  - Vascular Ultrasound  5th Floor: - HeartCare Cardiology (general and EP) - Clinical Pharmacy for coumadin, hypertension, lipid, weight-loss medications, and med management appointments    Valet parking services will be available as well.

## 2023-07-07 LAB — BASIC METABOLIC PANEL WITH GFR
BUN/Creatinine Ratio: 13 (ref 10–24)
BUN: 13 mg/dL (ref 8–27)
CO2: 20 mmol/L (ref 20–29)
Calcium: 9.2 mg/dL (ref 8.6–10.2)
Chloride: 102 mmol/L (ref 96–106)
Creatinine, Ser: 1.04 mg/dL (ref 0.76–1.27)
Glucose: 142 mg/dL — ABNORMAL HIGH (ref 70–99)
Potassium: 4.4 mmol/L (ref 3.5–5.2)
Sodium: 140 mmol/L (ref 134–144)
eGFR: 80 mL/min/{1.73_m2} (ref >59)

## 2023-07-07 LAB — BRAIN NATRIURETIC PEPTIDE: BNP: 168.1 pg/mL — ABNORMAL HIGH (ref 0.0–100.0)

## 2023-07-09 ENCOUNTER — Encounter: Payer: Self-pay | Admitting: Cardiology

## 2023-08-09 ENCOUNTER — Ambulatory Visit: Payer: Medicare (Managed Care) | Admitting: Cardiology

## 2023-08-20 ENCOUNTER — Ambulatory Visit: Payer: Medicare (Managed Care) | Attending: Cardiology | Admitting: Cardiology

## 2023-08-20 ENCOUNTER — Encounter: Payer: Self-pay | Admitting: Cardiology

## 2023-08-20 VITALS — BP 100/66 | HR 62 | Resp 16 | Ht 71.0 in | Wt 296.8 lb

## 2023-08-20 DIAGNOSIS — I25118 Atherosclerotic heart disease of native coronary artery with other forms of angina pectoris: Secondary | ICD-10-CM

## 2023-08-20 DIAGNOSIS — I5020 Unspecified systolic (congestive) heart failure: Secondary | ICD-10-CM

## 2023-08-20 DIAGNOSIS — I255 Ischemic cardiomyopathy: Secondary | ICD-10-CM

## 2023-08-20 NOTE — Patient Instructions (Addendum)
 Testing/Procedures: Echo (KEEP APPOINTMENT IN 09/2023)  Your physician has requested that you have an echocardiogram. Echocardiography is a painless test that uses sound waves to create images of your heart. It provides your doctor with information about the size and shape of your heart and how well your heart's chambers and valves are working. This procedure takes approximately one hour. There are no restrictions for this procedure. Please do NOT wear cologne, perfume, aftershave, or lotions (deodorant is allowed). Please arrive 15 minutes prior to your appointment time.  Please note: We ask at that you not bring children with you during ultrasound (echo/ vascular) testing. Due to room size and safety concerns, children are not allowed in the ultrasound rooms during exams. Our front office staff cannot provide observation of children in our lobby area while testing is being conducted. An adult accompanying a patient to their appointment will only be allowed in the ultrasound room at the discretion of the ultrasound technician under special circumstances. We apologize for any inconvenience.   Follow-Up: At [facility], you and your health needs are our priority.  As part of our continuing mission to provide you with exceptional heart care, our providers are all part of one team.  This team includes your primary Cardiologist (physician) and Advanced Practice Providers or APPs (Physician Assistants and Nurse Practitioners) who all work together to provide you with the care you need, when you need it.  Your next appointment:   10/2023  Provider:   Cody Das, MD

## 2023-08-20 NOTE — Progress Notes (Signed)
 Cardiology Office Note:  .   Date:  08/20/2023  ID:  Jonathon Cain, DOB February 06, 1961, MRN [id number] PCP: de Peru, Raymond J, MD  [facility] Providers Cardiologist:  Fransico Ivy, MD PCP: de [place], Alonza Jansky, MD  Chief Complaint  Patient presents with   Coronary artery disease of native artery of native heart wi   heart failure with reduced ejection fraction   Follow-up     Jonathon Cain is a 63 y.o. 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   Patient was seen by heart failure specialist Dr. Bruce Caper in 01/2023 and 03/2023. He was deemed not to be a candidate for advanced HF therapies due to blindness.  He is tolerating GDMT much better after changing the time of administration.  Breathing is improved, leg edema is resolved.  He still takes Lasix  40 mg daily, along with rest of his GDMT for HFrEF.   Vitals:   08/20/23 1120  BP: 100/66  Pulse: 62  Resp: 16  SpO2: 94%       Review of Systems  Cardiovascular:  Positive for dyspnea on exertion (Improving). Negative for chest pain, leg swelling, palpitations and syncope.        Studies Reviewed: Aaron Aas         Labs 2023/07/26: Cr 1.04 BNP 168  03/2023: Chol 115, TG 121, HDL 33, LDL 60 HbA1C 5.6% Hb 15.3 Cr 1.05 BNP 320 TSH 3.5  Echocardiogram 04/28/2023: Mild LVH. EF 20-25%. Grade III DD. Mild RV enlargement. Mild RV systolic dysfunction. Mild MR.  Mobile cardiac telemetry 14 days 11/03/2022 - 11/17/2022: Dominant rhythm: Sinus. HR 38-134 bpm. Avg HR 59 bpm, in sinus rhythm. 77 episodes of SVT/atrial tachycardia, fastest at 133 bpm for 4 beats, longest for 11.5 secs at 114 bpm. Some beats were conducted aberrantly.  <1% isolated SVE. 4 episodes of VT, fastest at 200 bpm for 4 beats, longest for 8 beats at  137 bpm. 1.9% isolated VE, <1% couplet/triplets. No atrial fibrillation/atrial flutter/high grade AV block, sinus pause >3sec noted. 0 patient triggered events.      Physical Exam Vitals and nursing note reviewed.  Constitutional:      General: He is not in acute distress. Neck:     Vascular: No JVD.  Cardiovascular:     Rate and Rhythm: Normal rate and regular rhythm.     Heart sounds: Normal heart sounds. No murmur heard. Pulmonary:     Effort: Pulmonary effort is normal.     Breath sounds: Normal breath sounds. No wheezing or rales.  Musculoskeletal:     Right lower leg: No edema.     Left lower leg: No edema.      VISIT DIAGNOSES: No diagnosis found.   Jonathon Cain is a 63 y.o. 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   Assessment & Plan  CAD: Cath 08/2021 showed patent LAD stent, moderate circumflex/RCA disease with negative FFR Continue aspirin , statin, Imdur  15 mg daily, metoprolol  succiante 25 mg daily.   Ischemic cardiomyopathy: EF 20-25% (echocardiogram 04/2023) Nonobstructive CAD, patent LAD stent (08/2021) NYHA class II symptoms. Currently on spironolactone  25 mg daily, Entresto  to 49-51 mg bid, Jardiance  10 mg daily, metoprolol  succinate 25 mg  daily, lasix  40 mg daily. I do not think he will tolerate further up titration of his Entresto  dose, given low normal blood pressures.  Check echocardiogram 10/2023.  If EF remains low, could consider referral to EP for ICD.  PAD: Successful intervention to b/l CIA and Rt SFA for severe caludication (02/2020) Successful intervention to Lt SFA ABI 0.8 b/l. Residual mild below the knee disease (08/2021) Continue Aspirin  81 mg daily, Xarelto  2.5 mg bid   F/u in 3 months  Signed, Cody Das, MD

## 2023-09-28 ENCOUNTER — Ambulatory Visit (HOSPITAL_COMMUNITY)
Admission: RE | Admit: 2023-09-28 | Discharge: 2023-09-28 | Disposition: A | Discharge status: Home / Self Care | Payer: Medicare (Managed Care) | Source: Ambulatory Visit | Attending: Cardiology | Admitting: Cardiology

## 2023-09-28 DIAGNOSIS — I25118 Atherosclerotic heart disease of native coronary artery with other forms of angina pectoris: Secondary | ICD-10-CM | POA: Insufficient documentation

## 2023-09-28 LAB — ECHOCARDIOGRAM COMPLETE
Area-P 1/2: 3.98 cm2
S' Lateral: 4.2 cm

## 2023-09-28 MED ORDER — PERFLUTREN LIPID MICROSPHERE
1.0000 mL | INTRAVENOUS | Status: AC | PRN
  Administered 2023-09-28: 2 mL via INTRAVENOUS

## 2023-09-30 ENCOUNTER — Ambulatory Visit: Payer: Self-pay | Admitting: Cardiology

## 2023-09-30 DIAGNOSIS — I255 Ischemic cardiomyopathy: Secondary | ICD-10-CM

## 2023-10-01 NOTE — Telephone Encounter (Signed)
 Referral order placed.

## 2023-10-01 NOTE — Telephone Encounter (Signed)
**Note De-identified  [place] Obfuscation** Please advise 

## 2023-10-01 NOTE — Telephone Encounter (Signed)
 ICD would be the consideration. We can go ahead and refer you to EP for ICD consideration. They may or may not request MRI before implantation.  Diagnosis: Ischemic cardiomyopathy  Thanks MJP

## 2023-10-13 ENCOUNTER — Ambulatory Visit (HOSPITAL_BASED_OUTPATIENT_CLINIC_OR_DEPARTMENT_OTHER): Payer: Medicare (Managed Care) | Admitting: Family Medicine

## 2023-10-20 ENCOUNTER — Ambulatory Visit (INDEPENDENT_AMBULATORY_CARE_PROVIDER_SITE_OTHER): Payer: Medicare (Managed Care) | Admitting: Family Medicine

## 2023-10-20 ENCOUNTER — Encounter (HOSPITAL_BASED_OUTPATIENT_CLINIC_OR_DEPARTMENT_OTHER): Payer: Self-pay | Admitting: Family Medicine

## 2023-10-20 VITALS — BP 119/62 | HR 67 | Ht 71.0 in | Wt 300.1 lb

## 2023-10-20 DIAGNOSIS — D72829 Elevated white blood cell count, unspecified: Secondary | ICD-10-CM | POA: Diagnosis not present

## 2023-10-20 DIAGNOSIS — R748 Abnormal levels of other serum enzymes: Secondary | ICD-10-CM | POA: Diagnosis not present

## 2023-10-20 DIAGNOSIS — Z125 Encounter for screening for malignant neoplasm of prostate: Secondary | ICD-10-CM | POA: Diagnosis not present

## 2023-10-20 DIAGNOSIS — R3912 Poor urinary stream: Secondary | ICD-10-CM

## 2023-10-20 NOTE — Progress Notes (Unsigned)
    Procedures performed today:    None.  Independent interpretation of notes and tests performed by another provider:   None.  Brief History, Exam, Impression, and Recommendations:    BP 119/62 (BP Location: Right Arm, Patient Position: Sitting, Cuff Size: Large)   Pulse 67   Ht 5' 11 (1.803 m)   Wt (!) 300 lb 1.6 oz (136.1 kg)   SpO2 97%   BMI 41.86 kg/m   Elevated alkaline phosphatase level Assessment & Plan: Noted on prior labs, has also been elevated in the past.  No obvious symptoms currently.  Will recheck today and can also check a GGT to assess for gallbladder versus alternative source of elevation  Orders: -     Gamma GT -     Hepatic function panel  Leukocytosis, unspecified type Assessment & Plan: Chronic issue for patient, recheck today for monitoring  Orders: -     CBC with Differential/Platelet  Prostate cancer screening -     PSA Total (Reflex To Free)  Poor urinary stream Assessment & Plan: Patient with history of difficulties with slow urinary stream.  He has been utilizing Flomax , however does not feel that this has been providing adequate improvement in symptoms. We previously placed referral to urology for further evaluation and recommendations, he is still needing to set this up, plans to in the near future, cites transportation issues, offered referral to try to assist with this, declines today.   Return in about 6 months (around 04/21/2024).   ___________________________________________ Jonathon Monrreal de Peru, MD, ABFM, CAQSM Primary Care and Sports Medicine [facility]

## 2023-10-20 NOTE — Assessment & Plan Note (Signed)
 Chronic issue for patient, recheck today for monitoring

## 2023-10-20 NOTE — Assessment & Plan Note (Signed)
 Noted on prior labs, has also been elevated in the past.  No obvious symptoms currently.  Will recheck today and can also check a GGT to assess for gallbladder versus alternative source of elevation

## 2023-10-20 NOTE — Assessment & Plan Note (Signed)
 Patient with history of difficulties with slow urinary stream.  He has been utilizing Flomax , however does not feel that this has been providing adequate improvement in symptoms. We previously placed referral to urology for further evaluation and recommendations, he is still needing to set this up, plans to in the near future, cites transportation issues, offered referral to try to assist with this, declines today.

## 2023-10-21 ENCOUNTER — Ambulatory Visit (HOSPITAL_BASED_OUTPATIENT_CLINIC_OR_DEPARTMENT_OTHER): Payer: Self-pay | Admitting: Family Medicine

## 2023-10-21 LAB — CBC WITH DIFFERENTIAL/PLATELET
Basophils Absolute: 0.1 x10E3/uL (ref 0.0–0.2)
Basos: 0 %
EOS (ABSOLUTE): 0.4 x10E3/uL (ref 0.0–0.4)
Eos: 3 %
Hematocrit: 48.2 % (ref 37.5–51.0)
Hemoglobin: 15.1 g/dL (ref 13.0–17.7)
Immature Grans (Abs): 0 x10E3/uL (ref 0.0–0.1)
Immature Granulocytes: 0 %
Lymphocytes Absolute: 2.5 x10E3/uL (ref 0.7–3.1)
Lymphs: 20 %
MCH: 29.2 pg (ref 26.6–33.0)
MCHC: 31.3 g/dL — ABNORMAL LOW (ref 31.5–35.7)
MCV: 93 fL (ref 79–97)
Monocytes Absolute: 1 x10E3/uL — ABNORMAL HIGH (ref 0.1–0.9)
Monocytes: 8 %
Neutrophils Absolute: 8.5 x10E3/uL — ABNORMAL HIGH (ref 1.4–7.0)
Neutrophils: 69 %
Platelets: 261 x10E3/uL (ref 150–450)
RBC: 5.18 x10E6/uL (ref 4.14–5.80)
RDW: 13.3 % (ref 11.6–15.4)
WBC: 12.5 x10E3/uL — ABNORMAL HIGH (ref 3.4–10.8)

## 2023-10-21 LAB — HEPATIC FUNCTION PANEL
ALT: 31 IU/L (ref 0–44)
AST: 28 IU/L (ref 0–40)
Albumin: 3.9 g/dL (ref 3.9–4.9)
Alkaline Phosphatase: 177 IU/L — ABNORMAL HIGH (ref 44–121)
Bilirubin Total: 0.7 mg/dL (ref 0.0–1.2)
Bilirubin, Direct: 0.25 mg/dL (ref 0.00–0.40)
Total Protein: 6.7 g/dL (ref 6.0–8.5)

## 2023-10-21 LAB — GAMMA GT: GGT: 46 IU/L (ref 0–65)

## 2023-10-21 LAB — PSA TOTAL (REFLEX TO FREE): Prostate Specific Ag, Serum: 1.5 ng/mL (ref 0.0–4.0)

## 2023-10-26 ENCOUNTER — Ambulatory Visit: Payer: Medicare (Managed Care) | Attending: Cardiovascular Disease | Admitting: Cardiology

## 2023-10-26 ENCOUNTER — Other Ambulatory Visit: Payer: Self-pay

## 2023-10-26 VITALS — BP 110/60 | HR 49 | Ht 71.0 in | Wt 299.1 lb

## 2023-10-26 DIAGNOSIS — I5022 Chronic systolic (congestive) heart failure: Secondary | ICD-10-CM

## 2023-10-26 DIAGNOSIS — I10 Essential (primary) hypertension: Secondary | ICD-10-CM

## 2023-10-26 DIAGNOSIS — I255 Ischemic cardiomyopathy: Secondary | ICD-10-CM

## 2023-10-26 DIAGNOSIS — I2510 Atherosclerotic heart disease of native coronary artery without angina pectoris: Secondary | ICD-10-CM | POA: Diagnosis not present

## 2023-10-26 NOTE — Progress Notes (Signed)
 Electrophysiology Office Note:   Date:  10/26/2023  ID:  Jonathon, Cain [birth year], MRN [id number]  Primary Cardiologist: Newman JINNY Lawrence, MD Electrophysiologist: Fonda Kitty, MD      History of Present Illness:   Jonathon Cain is a 63 y.o. 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 Lawrence.  Discussed the use of AI scribe software for clinical note transcription with the patient, who gave verbal consent to proceed.  History of Present Illness Jonathon Cain is a 63 year old male with heart failure who presents for evaluation of defibrillator placement.   He has a history of heart failure with a reduced ejection fraction. He reports that his most recent MRI showed an ejection fraction of 30%. He reports that previous measurements of his ejection fraction have fluctuated from 20 to 40%. He has a history of bradycardia, with his heart rate typically in the low forties to upper fifties. He inquires about the potential benefits of a defibrillator, particularly if it would increase his energy levels, as he often feels like he is 'swimming uphill in the mud'. He understands that a defibrillator would also function as a pacemaker, potentially increasing his heart rate to a programmed range of 60 to 130 beats per minute.  He expresses concern about a chronic elevated white blood cell count, which has been persistent for years. He occasionally experiences fevers, particularly when constipated, but has no other active symptoms of infection.   He recalls wearing a life vest, a wearable defibrillator, after his first and only heart attack, which he found uncomfortable and disruptive, particularly at  night.  He prefers to stay one night in the hospital post-procedure for comfort and peace of mind, partly to avoid staying at his brother's house, which he finds physically uncomfortable due to the small bathroom size.   Review of systems complete and found to be negative unless listed in HPI.   EP Information / Studies Reviewed:    EKG is ordered today. Personal review as below.  EKG Interpretation Date/Time:  Tuesday October 26 2023 12:00:25 EDT Ventricular Rate:  62 PR Interval:  222 QRS Duration:  120 QT Interval:  460 QTC Calculation: 466 R Axis:   -79  Text Interpretation: Sinus rhythm with 1st degree A-V block Left anterior fascicular block Anterolateral infarct (cited on or before 14-Nov-2019) When compared with ECG of 05-Feb-2023 14:01, Premature ventricular complexes are no longer Present Confirmed by Kitty Fonda [phone]) on 10/26/2023 10:03:42 PM   Echo 09/28/23:  1. Left ventricular ejection fraction, by estimation, is 35 to 40%. The  left ventricle has moderately decreased function. The left ventricle  demonstrates global hypokinesis. The left ventricular internal cavity size  was mildly dilated. Left ventricular  diastolic parameters are consistent with Grade I diastolic dysfunction  (impaired relaxation).   2. Right ventricular systolic function is normal. The right ventricular  size is normal. Tricuspid regurgitation signal is inadequate for assessing  PA pressure.   3. The mitral valve is grossly normal. Trivial mitral valve  regurgitation. No evidence of mitral stenosis.   4. The aortic valve was not well visualized. Aortic valve regurgitation  is not visualized. No aortic stenosis is present.   5. The inferior vena cava is normal in size  with greater than 50%  respiratory variability, suggesting right atrial pressure of 3 mmHg.   Cardiac MRI 12/2022:  IMPRESSION: 1. Mildly dilated LV with EF 30%. Regional wall motion abnormalities as noted above.   2.   Normal RV size with mildly decreased systolic function, EF 46%.   3. Delayed enhancement pattern suggestive of prior LAD-territory MI.   4. Mildly elevated extracellular volume percentage at 33%, suggesting increased myocardial fibrotic content (consistent with scar from prior MI).  Physical Exam:   VS:  BP 110/60   Pulse (!) 49   Ht 5' 11 (1.803 m)   Wt 299 lb 1.6 oz (135.7 kg)   SpO2 97%   BMI 41.72 kg/m    Wt Readings from Last 3 Encounters:  10/26/23 299 lb 1.6 oz (135.7 kg)  10/20/23 (!) 300 lb 1.6 oz (136.1 kg)  08/20/23 296 lb 12.8 oz (134.6 kg)     GEN: Well nourished, well developed in no acute distress NECK: No JVD CARDIAC: Bradycardic, regular rhythm RESPIRATORY:  Clear to auscultation without rales, wheezing or rhonchi  ABDOMEN: Soft, non-distended EXTREMITIES:  No edema; No deformity   ASSESSMENT AND PLAN:    #. Chronic systolic heart failure: LVEF 30% by MRI with scar in LAD territory. Zio with NSVT. NYHA class II.  Appears well compensated today. #. Ischemic cardiomyopathy: - Patient meets criteria for primary prevention ICD in the setting of ischemic cardiomyopathy with LVEF of 30% on cardiac MRI despite guideline directed medical therapy.  Ejection fraction slightly higher on most recent echocardiogram but MRI considered to be gold standard.  Additionally, supporting need for ICD is the presence of NSVT on Zio monitor and presence of scar on MRI. Explained risks, benefits, and alternatives to ICD implantation, including but not limited to bleeding, infection, damage to heart or lungs, heart attack, stroke, or death.  Pt verbalized understanding and wants to proceed.  We will implant a dual-chamber ICD given history of sinus bradycardia.  He intends to remain in the hospital overnight after his procedure so we will arrange for him to be my last case of the day. - He is concerned about infection risk associated with ICD given that he has a mild leukocytosis that is  chronic.  He has no obvious signs or symptoms of infection.  No fevers.  We will check procalcitonin and CRP in addition to repeating CBC.  #. CAD s/p LAD PCI: Denies chest pain. - Continue aspirin  81 mg once daily, atorvastatin  80 mg daily, Imdur  15 mg daily. - Continue follow-up with primary cardiologist.  #Hypertension -At goal today.  Recommend checking blood pressures 1-2 times per week at home and recording the values.  Recommend bringing these recordings to the primary care physician.  Follow up with Dr. Kennyth 3 months after ICD implant.   Signed, Fonda Kennyth, MD

## 2023-10-26 NOTE — Patient Instructions (Addendum)
 Medication Instructions:  Your physician recommends that you continue on your current medications as directed. Please refer to the Current Medication list given to you today.  *If you need a refill on your cardiac medications before your next appointment, please call your pharmacy*  Lab Work: BMET, CBC, CRP, calcitonin - please have these done at any [facility] location the week of September 8th-12th  Testing/Procedures: ICD Implant Your physician has recommended that you have a defibrillator inserted. An implantable cardioverter defibrillator (ICD) is a small device that is placed in your chest or, in rare cases, your abdomen. This device uses electrical pulses or shocks to help control life-threatening, irregular heartbeats that could lead the heart to suddenly stop beating (sudden cardiac arrest). Leads are attached to the ICD that goes into your heart. This is done in the hospital and usually requires an overnight stay.  You are scheduled for an ICD Implant on Tuesday, September 16 with Dr. Sidra Kitty.Please arrive at the Main Entrance A at [facility]: [address] [place], [place] [address] at 12:30 PM    Follow-Up: At [facility], you and your health needs are our priority.  As part of our continuing mission to provide you with exceptional heart care, our providers are all part of one team.  This team includes your primary Cardiologist (physician) and Advanced Practice Providers or APPs (Physician Assistants and Nurse Practitioners) who all work together to provide you with the care you need, when you need it.  We will call you to schedule your post-procedure appointments.

## 2023-11-03 ENCOUNTER — Ambulatory Visit: Payer: Medicare (Managed Care) | Admitting: Cardiology

## 2023-11-08 ENCOUNTER — Ambulatory Visit: Payer: Medicare (Managed Care) | Attending: Cardiology | Admitting: Cardiology

## 2023-11-08 ENCOUNTER — Encounter: Payer: Self-pay | Admitting: Cardiology

## 2023-11-08 VITALS — BP 98/54 | HR 63 | Ht 71.0 in | Wt 299.0 lb

## 2023-11-08 DIAGNOSIS — I739 Peripheral vascular disease, unspecified: Secondary | ICD-10-CM

## 2023-11-08 DIAGNOSIS — N320 Bladder-neck obstruction: Secondary | ICD-10-CM | POA: Insufficient documentation

## 2023-11-08 DIAGNOSIS — I5020 Unspecified systolic (congestive) heart failure: Secondary | ICD-10-CM | POA: Diagnosis not present

## 2023-11-08 DIAGNOSIS — I25118 Atherosclerotic heart disease of native coronary artery with other forms of angina pectoris: Secondary | ICD-10-CM | POA: Diagnosis not present

## 2023-11-08 MED ORDER — TAMSULOSIN HCL 0.4 MG PO CAPS: 0.4000 mg | ORAL_CAPSULE | Freq: Every day | ORAL | 0 refills | Status: DC | PRN

## 2023-11-08 NOTE — Progress Notes (Signed)
 Cardiology Office Note:  .   Date:  11/08/2023  ID:  Arley Lemond Breen, DOB 01/31/1961, MRN [id number] PCP: de [place], Raymond J, MD  [facility] Providers Cardiologist:  Newman Lawrence, MD PCP: de [place], Quintin PARAS, MD  Chief Complaint  Patient presents with   Cardiomyopathy     Emilliano Dilworth is a 63 y.o. 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   Patient was seen by EP Dr. Kennyth earlier this month and was recommended dual-chamber ICD placement given ischemic cardiomyopathy and history of sinus bradycardia.  Patient continues to have low normal blood pressure, but denies any dizziness symptoms.  He has had some recent increase in exertional dyspnea symptoms.    Vitals:   11/08/23 1149  BP: (!) 98/54  Pulse: 63  SpO2: 96%        Review of Systems  Cardiovascular:  Positive for dyspnea on exertion (Improving). Negative for chest pain, leg swelling, palpitations and syncope.        Studies Reviewed: SABRA        Labs 09/2023: AlKP 177 Hb 15.1  Labs 06/2023: Cr 1.04 BNP 168  03/2023: Chol 115, TG 121, HDL 33, LDL 60 HbA1C 5.6% Hb 15.3 Cr 1.05 BNP 320 TSH 3.5  Echocardiogram 04/28/2023: Mild LVH. EF 20-25%. Grade III DD. Mild RV enlargement. Mild RV systolic dysfunction. Mild MR.  Mobile cardiac telemetry 14 days 11/03/2022 - 11/17/2022: Dominant rhythm: Sinus. HR 38-134 bpm. Avg HR 59 bpm, in sinus rhythm. 77 episodes of SVT/atrial tachycardia, fastest at 133 bpm for 4 beats, longest for 11.5 secs at 114 bpm. Some beats were conducted aberrantly.  <1% isolated SVE. 4 episodes of VT, fastest at 200 bpm for 4 beats, longest for 8 beats at 137 bpm. 1.9% isolated VE, <1% couplet/triplets. No atrial fibrillation/atrial flutter/high grade AV block, sinus  pause >3sec noted. 0 patient triggered events.      Physical Exam Vitals and nursing note reviewed.  Constitutional:      General: He is not in acute distress. Neck:     Vascular: No JVD.  Cardiovascular:     Rate and Rhythm: Normal rate and regular rhythm.     Heart sounds: Normal heart sounds. No murmur heard. Pulmonary:     Effort: Pulmonary effort is normal.     Breath sounds: Normal breath sounds. No wheezing or rales.  Musculoskeletal:     Right lower leg: No edema.     Left lower leg: No edema.      VISIT DIAGNOSES:   ICD-10-CM   1. HFrEF (heart failure with reduced ejection fraction) (HCC)  I50.20 Basic metabolic panel with GFR    Pro b natriuretic peptide (BNP)    2. Bladder outlet obstruction  N32.0 tamsulosin  (FLOMAX ) 0.4 MG CAPS capsule    3. Coronary artery disease of native artery of native heart with stable angina pectoris (HCC)  I25.118     4. PAD (peripheral artery disease) (HCC)  I73.9        Efren Kross is a 63 y.o. 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   Assessment & Plan  CAD: Cath 08/2021 showed patent LAD stent, moderate circumflex/RCA disease with negative FFR Continue  aspirin , statin, Imdur  15 mg daily, metoprolol  succiante 25 mg daily.   Ischemic cardiomyopathy: EF 20-25% (echocardiogram 04/2023) Nonobstructive CAD, patent LAD stent (08/2021) NYHA class II symptoms. Currently on spironolactone  25 mg daily, Entresto  to 49-51 mg bid, Jardiance  10 mg daily, metoprolol  succinate 25 mg daily, lasix  40 mg daily. I do not think he will tolerate further up titration of his Entresto  dose, given low normal blood pressures. Check BMP and proBNP today. Upcoming ICD placement with Dr. Kennyth.  PAD: Successful intervention to b/l CIA and Rt SFA for  severe caludication (02/2020) Successful intervention to Lt SFA ABI 0.8 b/l. Residual mild below the knee disease (08/2021) Continue Aspirin  81 mg daily, Xarelto  2.5 mg bid  At patient's request, I refilled his Flomax  for 30 days.  Recommend future refills with PCP/urology.  Encourage increased fluid intake on days he takes Flomax  for  F/u in 3 months  Signed, Newman JINNY Lawrence, MD

## 2023-11-08 NOTE — Patient Instructions (Signed)
  Lab Work: Bmp Probnp  If you have labs (blood work) drawn today and your tests are completely normal, you will receive your results only by: MyChart Message (if you have MyChart) OR A paper copy in the mail If you have any lab test that is abnormal or we need to change your treatment, we will call you to review the results.   Follow-Up: At [facility], you and your health needs are our priority.  As part of our continuing mission to provide you with exceptional heart care, our providers are all part of one team.  This team includes your primary Cardiologist (physician) and Advanced Practice Providers or APPs (Physician Assistants and Nurse Practitioners) who all work together to provide you with the care you need, when you need it.  Your next appointment:   3 month(s)  Provider:   Newman JINNY Lawrence, MD

## 2023-11-09 ENCOUNTER — Ambulatory Visit: Payer: Self-pay | Admitting: Cardiology

## 2023-11-09 LAB — BASIC METABOLIC PANEL WITH GFR
BUN/Creatinine Ratio: 14 (ref 10–24)
BUN: 16 mg/dL (ref 8–27)
CO2: 19 mmol/L — ABNORMAL LOW (ref 20–29)
Calcium: 8.9 mg/dL (ref 8.6–10.2)
Chloride: 102 mmol/L (ref 96–106)
Creatinine, Ser: 1.18 mg/dL (ref 0.76–1.27)
Glucose: 96 mg/dL (ref 70–99)
Potassium: 4.3 mmol/L (ref 3.5–5.2)
Sodium: 137 mmol/L (ref 134–144)
eGFR: 68 mL/min/1.73 (ref >59)

## 2023-11-09 LAB — PRO B NATRIURETIC PEPTIDE: NT-Pro BNP: 415 pg/mL — ABNORMAL HIGH (ref 0–376)

## 2023-11-13 ENCOUNTER — Other Ambulatory Visit: Payer: Self-pay | Admitting: Cardiology

## 2023-12-01 ENCOUNTER — Encounter: Payer: Self-pay | Admitting: Cardiology

## 2023-12-02 LAB — CBC WITH DIFFERENTIAL/PLATELET
Basophils Absolute: 0.1 x10E3/uL (ref 0.0–0.2)
Basos: 0 %
EOS (ABSOLUTE): 0.1 x10E3/uL (ref 0.0–0.4)
Eos: 1 %
Hematocrit: 46.7 % (ref 37.5–51.0)
Hemoglobin: 15.2 g/dL (ref 13.0–17.7)
Immature Grans (Abs): 0.1 x10E3/uL (ref 0.0–0.1)
Immature Granulocytes: 1 %
Lymphocytes Absolute: 2.5 x10E3/uL (ref 0.7–3.1)
Lymphs: 17 %
MCH: 29.2 pg (ref 26.6–33.0)
MCHC: 32.5 g/dL (ref 31.5–35.7)
MCV: 90 fL (ref 79–97)
Monocytes Absolute: 1.1 x10E3/uL — ABNORMAL HIGH (ref 0.1–0.9)
Monocytes: 7 %
Neutrophils Absolute: 11.3 x10E3/uL — ABNORMAL HIGH (ref 1.4–7.0)
Neutrophils: 74 %
Platelets: 259 x10E3/uL (ref 150–450)
RBC: 5.21 x10E6/uL (ref 4.14–5.80)
RDW: 13.5 % (ref 11.6–15.4)
WBC: 15.1 x10E3/uL — ABNORMAL HIGH (ref 3.4–10.8)

## 2023-12-02 LAB — BASIC METABOLIC PANEL WITH GFR
BUN/Creatinine Ratio: 15 (ref 10–24)
BUN: 18 mg/dL (ref 8–27)
CO2: 18 mmol/L — AB (ref 20–29)
Calcium: 9 mg/dL (ref 8.6–10.2)
Chloride: 101 mmol/L (ref 96–106)
Creatinine, Ser: 1.24 mg/dL (ref 0.76–1.27)
Glucose: 85 mg/dL (ref 70–99)
Potassium: 4.1 mmol/L (ref 3.5–5.2)
Sodium: 137 mmol/L (ref 134–144)
eGFR: 65 mL/min/1.73 (ref >59)

## 2023-12-02 LAB — CALCITONIN: Calcitonin: 11.5 pg/mL — AB (ref 0.0–8.4)

## 2023-12-02 LAB — C-REACTIVE PROTEIN: CRP: 7 mg/L (ref 0–10)

## 2023-12-06 NOTE — Pre-Procedure Instructions (Signed)
 Attempted to call patient regarding procedure instructions.  Left voicemail on the following items: Arrival time 1230 Nothing to eat or drink after midnight No meds AM of procedure Responsible person to drive you home and stay with you for 24 hrs Wash with special soap night before and morning of procedure If on anti-coagulant drug instructions Xarelto - last dose 9/12

## 2023-12-07 ENCOUNTER — Other Ambulatory Visit: Payer: Self-pay

## 2023-12-07 ENCOUNTER — Inpatient Hospital Stay (HOSPITAL_COMMUNITY)
Admission: RE | Disposition: A | Discharge status: Home / Self Care | Payer: Medicare (Managed Care) | Source: Home / Self Care | Attending: Student in an Organized Health Care Education/Training Program

## 2023-12-07 ENCOUNTER — Observation Stay (HOSPITAL_COMMUNITY)
Admission: RE | Admit: 2023-12-07 | Discharge: 2023-12-08 | Disposition: A | Discharge status: Home / Self Care | Payer: Medicare (Managed Care) | Attending: Student in an Organized Health Care Education/Training Program | Admitting: Student in an Organized Health Care Education/Training Program

## 2023-12-07 DIAGNOSIS — I5022 Chronic systolic (congestive) heart failure: Secondary | ICD-10-CM | POA: Diagnosis not present

## 2023-12-07 DIAGNOSIS — H548 Legal blindness, as defined in USA: Secondary | ICD-10-CM | POA: Insufficient documentation

## 2023-12-07 DIAGNOSIS — Z23 Encounter for immunization: Secondary | ICD-10-CM | POA: Insufficient documentation

## 2023-12-07 DIAGNOSIS — R001 Bradycardia, unspecified: Secondary | ICD-10-CM | POA: Diagnosis not present

## 2023-12-07 DIAGNOSIS — I2510 Atherosclerotic heart disease of native coronary artery without angina pectoris: Secondary | ICD-10-CM | POA: Diagnosis not present

## 2023-12-07 DIAGNOSIS — Z79899 Other long term (current) drug therapy: Secondary | ICD-10-CM | POA: Diagnosis not present

## 2023-12-07 DIAGNOSIS — Z7982 Long term (current) use of aspirin: Secondary | ICD-10-CM | POA: Diagnosis not present

## 2023-12-07 DIAGNOSIS — I255 Ischemic cardiomyopathy: Secondary | ICD-10-CM | POA: Diagnosis not present

## 2023-12-07 DIAGNOSIS — I110 Hypertensive heart disease with heart failure: Secondary | ICD-10-CM | POA: Diagnosis not present

## 2023-12-07 DIAGNOSIS — I214 Non-ST elevation (NSTEMI) myocardial infarction: Principal | ICD-10-CM | POA: Diagnosis present

## 2023-12-07 HISTORY — PX: ICD IMPLANT: EP1208

## 2023-12-07 SURGERY — ICD IMPLANT

## 2023-12-07 MED ORDER — METOPROLOL SUCCINATE 12.5 MG HALF TABLET
12.5000 mg | ORAL_TABLET | Freq: Every day | ORAL | Status: DC
  Administered 2023-12-08: 12.5 mg via ORAL
  Filled 2023-12-07: qty 1

## 2023-12-07 MED ORDER — ACETAMINOPHEN 325 MG PO TABS
325.0000 mg | ORAL_TABLET | ORAL | Status: DC | PRN
  Administered 2023-12-07 – 2023-12-08 (×2): 650 mg via ORAL
  Filled 2023-12-07 (×2): qty 2

## 2023-12-07 MED ORDER — MIDAZOLAM HCL 2 MG/2ML IJ SOLN
INTRAMUSCULAR | Status: AC
  Filled 2023-12-07: qty 2

## 2023-12-07 MED ORDER — ATORVASTATIN CALCIUM 80 MG PO TABS
80.0000 mg | ORAL_TABLET | Freq: Every day | ORAL | Status: DC
  Administered 2023-12-07: 80 mg via ORAL
  Filled 2023-12-07: qty 1

## 2023-12-07 MED ORDER — SACUBITRIL-VALSARTAN 49-51 MG PO TABS
1.0000 | ORAL_TABLET | Freq: Two times a day (BID) | ORAL | Status: DC
  Administered 2023-12-08: 1 via ORAL
  Filled 2023-12-07: qty 1

## 2023-12-07 MED ORDER — SODIUM CHLORIDE 0.9 % IV SOLN
INTRAVENOUS | Status: AC
  Filled 2023-12-07: qty 2

## 2023-12-07 MED ORDER — SODIUM CHLORIDE 0.9 % IV SOLN: INTRAVENOUS | Status: DC

## 2023-12-07 MED ORDER — ONDANSETRON HCL 4 MG/2ML IJ SOLN: 4.0000 mg | Freq: Four times a day (QID) | INTRAMUSCULAR | Status: DC | PRN

## 2023-12-07 MED ORDER — CHLORHEXIDINE GLUCONATE 4 % EX SOLN
4.0000 | Freq: Once | CUTANEOUS | Status: DC
  Filled 2023-12-07: qty 60

## 2023-12-07 MED ORDER — MIDAZOLAM HCL 5 MG/5ML IJ SOLN
INTRAMUSCULAR | Status: DC | PRN
  Administered 2023-12-07 (×6): 1 mg via INTRAVENOUS

## 2023-12-07 MED ORDER — POVIDONE-IODINE 10 % EX SWAB
2.0000 | Freq: Once | CUTANEOUS | Status: AC
  Administered 2023-12-07: 2 via TOPICAL

## 2023-12-07 MED ORDER — FENTANYL CITRATE (PF) 100 MCG/2ML IJ SOLN
INTRAMUSCULAR | Status: AC
  Filled 2023-12-07: qty 2

## 2023-12-07 MED ORDER — CEFAZOLIN SODIUM-DEXTROSE 3-4 GM/150ML-% IV SOLN
3.0000 g | INTRAVENOUS | Status: AC
  Administered 2023-12-07: 3 g via INTRAVENOUS
  Filled 2023-12-07 (×2): qty 150

## 2023-12-07 MED ORDER — CEFAZOLIN SODIUM-DEXTROSE 1-4 GM/50ML-% IV SOLN
1.0000 g | Freq: Four times a day (QID) | INTRAVENOUS | Status: DC
  Administered 2023-12-07 – 2023-12-08 (×2): 1 g via INTRAVENOUS
  Filled 2023-12-07 (×5): qty 50

## 2023-12-07 MED ORDER — SODIUM CHLORIDE 0.9 % IV SOLN
80.0000 mg | INTRAVENOUS | Status: AC
  Administered 2023-12-07: 80 mg

## 2023-12-07 MED ORDER — TAMSULOSIN HCL 0.4 MG PO CAPS: 0.4000 mg | ORAL_CAPSULE | Freq: Every day | ORAL | Status: DC | PRN

## 2023-12-07 MED ORDER — MIDAZOLAM HCL 5 MG/5ML IJ SOLN
INTRAMUSCULAR | Status: AC
  Filled 2023-12-07: qty 5

## 2023-12-07 MED ORDER — LIDOCAINE HCL (PF) 1 % IJ SOLN
INTRAMUSCULAR | Status: DC | PRN
  Administered 2023-12-07 (×2): 60 mL

## 2023-12-07 MED ORDER — CEFAZOLIN SODIUM-DEXTROSE 1-4 GM/50ML-% IV SOLN: 1.0000 g | Freq: Four times a day (QID) | INTRAVENOUS | Status: DC

## 2023-12-07 MED ORDER — FENTANYL CITRATE (PF) 100 MCG/2ML IJ SOLN
INTRAMUSCULAR | Status: DC | PRN
  Administered 2023-12-07: 25 ug via INTRAVENOUS
  Administered 2023-12-07: 50 ug via INTRAVENOUS
  Administered 2023-12-07 (×3): 25 ug via INTRAVENOUS
  Administered 2023-12-07: 50 ug via INTRAVENOUS
  Administered 2023-12-07: 25 ug via INTRAVENOUS

## 2023-12-07 MED ORDER — SPIRONOLACTONE 25 MG PO TABS
25.0000 mg | ORAL_TABLET | Freq: Every day | ORAL | Status: DC
  Administered 2023-12-08: 25 mg via ORAL
  Filled 2023-12-07: qty 1

## 2023-12-07 MED ORDER — ISOSORBIDE MONONITRATE ER 30 MG PO TB24
15.0000 mg | ORAL_TABLET | Freq: Every day | ORAL | Status: DC
  Administered 2023-12-07 – 2023-12-08 (×2): 15 mg via ORAL
  Filled 2023-12-07 (×2): qty 1

## 2023-12-07 MED ORDER — HEPARIN (PORCINE) IN NACL 1000-0.9 UT/500ML-% IV SOLN
INTRAVENOUS | Status: DC | PRN
  Administered 2023-12-07 (×2): 500 mL

## 2023-12-07 SURGICAL SUPPLY — 15 items
CABLE SURGICAL S-101-97-12 (CABLE) ×2 IMPLANT
CATH HIS SELECTSITE C304HIS (CATHETERS) IMPLANT
ICD VIGILANT DR D233 (Pacemaker) IMPLANT
KIT INSTRUMENT PACEMAKER INSER (INSTRUMENTS) IMPLANT
KIT MICROPUNCTURE NIT STIFF (SHEATH) IMPLANT
LEAD INGEVITY 7841 52 (Lead) IMPLANT
LEAD RELIANCE 0673 IMPLANT
PAD DEFIB RADIO PHYSIO CONN (PAD) ×2 IMPLANT
POUCH AIGIS-R ANTIBACT ICD LRG (Mesh General) IMPLANT
SHEATH 7FR PRELUDE SNAP 13 (SHEATH) IMPLANT
SHEATH 8FR PRELUDE SNAP 13 (SHEATH) IMPLANT
SHEATH 9FR PRELUDE SNAP 13 (SHEATH) IMPLANT
SHEATH PROBE COVER 6X72 (BAG) IMPLANT
TRAY PACEMAKER INSERTION (PACKS) ×2 IMPLANT
WIRE HI TORQ VERSACORE-J 145CM (WIRE) IMPLANT

## 2023-12-07 NOTE — H&P (Signed)
 Electrophysiology Note:   Date:  12/07/23 ID:  Jonathon Cain, DOB 1961/01/27, MRN [id number]   Primary Cardiologist: Newman JINNY Lawrence, MD Electrophysiologist: Fonda Kitty, MD       History of Present Illness:   Jonathon Cain is a 63 y.o. 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 Lawrence.   Discussed the use of AI scribe software for clinical note transcription with the patient, who gave verbal consent to proceed.   History of Present Illness Jonathon Cain is a 63 year old male with heart failure who presents for evaluation of defibrillator placement.    He has a history of heart failure with a reduced ejection fraction. He reports that his most recent MRI showed an ejection fraction of 30%. He reports that previous measurements of his ejection fraction have fluctuated from 20 to 40%. He has a history of bradycardia, with his heart rate typically in the low forties to upper fifties. He inquires about the potential benefits of a defibrillator, particularly if it would increase his energy levels, as he often feels like he is 'swimming uphill in the mud'. He understands that a defibrillator would also function as a pacemaker, potentially increasing his heart rate to a programmed range of 60 to 130 beats per minute.   He expresses concern about a chronic elevated white blood cell count, which has been persistent for years. He occasionally experiences fevers, particularly when constipated, but has no other active symptoms of infection.    He recalls wearing a life vest, a wearable defibrillator, after his first and only heart attack, which he found uncomfortable and disruptive, particularly at  night.   He prefers to stay one night in the hospital post-procedure for comfort and peace of mind, partly to avoid staying at his brother's house, which he finds physically uncomfortable due to the small bathroom size.   Interval: Patient presents today for planned ICD implant. Reports feeling relatively well. No new or acute complaints.   Review of systems complete and found to be negative unless listed in HPI.    EP Information / Studies Reviewed:     EKG is ordered today. Personal review as below.   EKG Interpretation Date/Time:                  Tuesday October 26 2023 12:00:25 EDT Ventricular Rate:         62 PR Interval:                 222 QRS Duration:             120 QT Interval:                 460 QTC Calculation:466 R Axis:                         -79   Text Interpretation:Sinus rhythm with 1st degree A-V block Left anterior fascicular block Anterolateral infarct (cited on or before 14-Nov-2019) When compared with ECG of 05-Feb-2023 14:01, Premature ventricular complexes are no longer Present Confirmed by Kitty Fonda [phone]) on 10/26/2023 10:03:42 PM    Echo 09/28/23:  1. Left ventricular ejection fraction, by estimation, is 35 to 40%. The  left  ventricle has moderately decreased function. The left ventricle  demonstrates global hypokinesis. The left ventricular internal cavity size  was mildly dilated. Left ventricular  diastolic parameters are consistent with Grade I diastolic dysfunction  (impaired relaxation).   2. Right ventricular systolic function is normal. The right ventricular  size is normal. Tricuspid regurgitation signal is inadequate for assessing  PA pressure.   3. The mitral valve is grossly normal. Trivial mitral valve  regurgitation. No evidence of mitral stenosis.   4. The aortic valve was not well visualized. Aortic valve regurgitation  is not visualized. No aortic stenosis is present.   5. The inferior vena cava is normal in size with greater than  50%  respiratory variability, suggesting right atrial pressure of 3 mmHg.    Cardiac MRI 12/2022:  IMPRESSION: 1. Mildly dilated LV with EF 30%. Regional wall motion abnormalities as noted above.   2.  Normal RV size with mildly decreased systolic function, EF 46%.   3. Delayed enhancement pattern suggestive of prior LAD-territory MI.   4. Mildly elevated extracellular volume percentage at 33%, suggesting increased myocardial fibrotic content (consistent with scar from prior MI).   Physical Exam:    Today's Vitals   12/06/23 1400 12/07/23 1237 12/07/23 1244  BP:   108/60  Pulse:   (!) 53  Resp:   17  Temp:   99.2 F (37.3 C)  TempSrc:   Oral  SpO2:   97%  Weight: 135.6 kg  132 kg  Height:   5' 10 (1.778 m)  PainSc:  0-No pain    Body mass index is 41.75 kg/m.  GEN: Well nourished, well developed in no acute distress NECK: No JVD CARDIAC: Bradycardic, regular rhythm RESPIRATORY:  Clear to auscultation without rales, wheezing or rhonchi  ABDOMEN: Soft, non-distended EXTREMITIES:  No edema; No deformity    ASSESSMENT AND PLAN:     #. Chronic systolic heart failure: LVEF 30% by MRI with scar in LAD territory. Zio with NSVT. NYHA class II.  Appears well compensated today. #. Ischemic cardiomyopathy: - Patient meets criteria for primary prevention ICD in the setting of ischemic cardiomyopathy with LVEF of 30% on cardiac MRI despite guideline directed medical therapy.  Ejection fraction slightly higher on most recent echocardiogram but MRI considered to be gold standard.  Additionally, supporting need for ICD is the presence of NSVT on Zio monitor and presence of scar on MRI. Explained risks, benefits, and alternatives to ICD implantation, including but not limited to bleeding, infection, damage to heart or lungs, heart attack, stroke, or death.  Pt verbalized understanding and wants to proceed today.  We will implant a dual-chamber ICD given history of sinus bradycardia.  He  intends to remain in the hospital overnight after his procedure.    Follow up with Dr. Kennyth 3 months after ICD implant.    Signed, Fonda Kennyth, MD

## 2023-12-08 ENCOUNTER — Encounter (HOSPITAL_COMMUNITY): Payer: Self-pay | Admitting: Cardiology

## 2023-12-08 ENCOUNTER — Inpatient Hospital Stay (HOSPITAL_COMMUNITY): Payer: Medicare (Managed Care)

## 2023-12-08 ENCOUNTER — Telehealth: Payer: Self-pay

## 2023-12-08 DIAGNOSIS — H548 Legal blindness, as defined in USA: Secondary | ICD-10-CM | POA: Diagnosis not present

## 2023-12-08 DIAGNOSIS — I5022 Chronic systolic (congestive) heart failure: Secondary | ICD-10-CM | POA: Diagnosis not present

## 2023-12-08 DIAGNOSIS — R001 Bradycardia, unspecified: Secondary | ICD-10-CM | POA: Diagnosis not present

## 2023-12-08 DIAGNOSIS — I110 Hypertensive heart disease with heart failure: Secondary | ICD-10-CM | POA: Diagnosis not present

## 2023-12-08 MED ORDER — MELATONIN 3 MG PO TABS
3.0000 mg | ORAL_TABLET | Freq: Every day | ORAL | Status: DC
Start: 2023-12-08 — End: 2023-12-08
  Administered 2023-12-08: 3 mg via ORAL
  Filled 2023-12-08: qty 1

## 2023-12-08 MED ORDER — ORAL CARE MOUTH RINSE: 15.0000 mL | OROMUCOSAL | Status: DC | PRN

## 2023-12-08 MED ORDER — INFLUENZA VAC SPLIT HIGH-DOSE 0.5 ML IM SUSY
0.5000 mL | PREFILLED_SYRINGE | INTRAMUSCULAR | Status: AC
  Administered 2023-12-08: 0.5 mL via INTRAMUSCULAR
  Filled 2023-12-08: qty 0.5

## 2023-12-08 MED ORDER — HYDROCODONE-ACETAMINOPHEN 5-325 MG PO TABS
1.0000 | ORAL_TABLET | Freq: Four times a day (QID) | ORAL | Status: AC | PRN
  Administered 2023-12-08 (×2): 1 via ORAL
  Filled 2023-12-08 (×2): qty 1

## 2023-12-08 MED FILL — Midazolam HCl Inj 2 MG/2ML (Base Equivalent): INTRAMUSCULAR | Qty: 1 | Status: AC

## 2023-12-08 MED FILL — Midazolam HCl Inj PF 5 MG/5ML (Base Equivalent): INTRAMUSCULAR | Qty: 5 | Status: AC

## 2023-12-08 NOTE — Care Management Important Message (Signed)
 Important Message  Patient Details  Name: Jonathon Cain MRN: [id number] Date of Birth: 08/16/1960   Important Message Given:        Waddell Barnie Rama, RN 12/08/2023, 10:52 AM

## 2023-12-08 NOTE — Progress Notes (Signed)
 Verbal confirmation from Charlies Arthur, PA-C that patients pacemaker has been interrogated.

## 2023-12-08 NOTE — Progress Notes (Signed)
 Discharge Summary: DC order noted per MD. DC RN at bedside. Med details completed, unable to print AVS pending CM eval for Code 44 workup. Primary RN informed. No home/TOC meds pending pickup.

## 2023-12-08 NOTE — Telephone Encounter (Signed)
 Follow-up after same day discharge: Implant date: 12/07/2023 MD: Dr. Sidra Kitty Device: [facility] [employer] [id number] VIGILANT EL ICD Location: Left Chest   Wound check visit: 12/21/23 @ 2:40 PM 90 day MD follow-up: 03/08/24 @ 2:20 PM  Remote Transmission received:Yes  Dressing/sling removed: Yes  Confirm OAC restart on: 12/11/23  Please continue to monitor your cardiac device site for redness, swelling, and drainage. Call the device clinic at [phone] if you experience these symptoms, fever/chills, or have questions about your device.   Remote monitoring is used to monitor your cardiac device from home. This monitoring is scheduled every 91 days by our office. It allows us  to keep an eye on the functioning of your device to ensure it is working properly.

## 2023-12-08 NOTE — Care Management CC44 (Signed)
 Condition Code 44 Documentation Completed  Patient Details  Name: Fahed Morten MRN: [id number] Date of Birth: [birth year]   Condition Code 44 given:  Yes Patient signature on Condition Code 44 notice:  Yes Documentation of 2 MD's agreement:  Yes Code 44 added to claim:  Yes    Waddell Barnie Rama, RN 12/08/2023, 10:53 AM

## 2023-12-08 NOTE — Progress Notes (Signed)
 Waiting for patients ICD to be interrogated before discharge.

## 2023-12-08 NOTE — Discharge Instructions (Signed)
 After Your ICD (Implantable Cardiac Defibrillator)   You have a [employer] ICD  If you have a Med[employer]onic or [employer]ronik device, plug in your home monitor once you get home, and no manual interaction is required.   If you have an [employer] or [employer] device, plug your home monitor once you get home, sit near the device, and press the large activation button. Sit nearby until the process is complete, usually notated by lights on the monitor.   If you were set up for monitoring using an app on your phone, make sure the app remains open in the background and the Bluetooth remains on.  ACTIVITY Do not lift your arm above shoulder height for 1 week after your procedure. After 7 days, you may progress as below.  You should remove your sling 24 hours after your procedure, unless otherwise instructed by your provider.     Wednesday December 15, 2023  Thursday December 16, 2023 Friday December 17, 2023 Saturday December 18, 2023   Do not lift, push, pull, or carry anything over 10 pounds with the affected arm until 6 weeks (Wednesday January 19, 2024 ) after your procedure.   You may drive AFTER your wound check, unless you have been told otherwise by your provider.   Ask your healthcare provider when you can go back to work   INCISION/Dressing If you are on a blood thinner such as Coumadin, Xarelto , Eliquis, Plavix , or Pradaxa please confirm with your provider when this should be resumed. Do not resume your Xarelto  until 12/11/23  If large square, outer bandage is left in place, this can be removed after 24 hours from your procedure. Do not remove steri-strips or glue as below.   Monitor your defibrillator site for redness, swelling, and drainage. Call the device clinic at [phone] if you experience these symptoms or fever/chills.  If your incision is sealed with Steri-strips or staples, you may shower 7 days after your procedure or when told by your provider. Do not remove  the steri-strips or let the shower hit directly on your site. You may wash around your site with soap and water.    If you were discharged in a sling, please do not wear this during the day more than 48 hours after your surgery unless otherwise instructed. This may increase the risk of stiffness and soreness in your shoulder.   Avoid lotions, ointments, or perfumes over your incision until it is well-healed.  You may use a hot tub or a pool AFTER your wound check appointment if the incision is completely closed.  Your ICD is designed to protect you from life threatening heart rhythms. Because of this, you may receive a shock.   1 shock with no symptoms:  Call the office during business hours. 1 shock with symptoms (chest pain, chest pressure, dizziness, lightheadedness, shortness of breath, overall feeling unwell):  Call 911. If you experience 2 or more shocks in 24 hours:  Call 911. If you receive a shock, you should not drive for 6 months per the [place] DMV IF you receive appropriate therapy from your ICD.   ICD Alerts:  Some alerts are vibratory and others beep. These are NOT emergencies. Please call our office to let us  know. If this occurs at night or on weekends, it can wait until the next business day. Send a remote transmission.  If your device is capable of reading fluid status (for heart failure), you will be offered monthly monitoring to review this with  you.   DEVICE MANAGEMENT Remote monitoring is used to monitor your ICD from home. This monitoring is scheduled every 91 days by our office. It allows us  to keep an eye on the functioning of your device to ensure it is working properly. You will routinely see your Electrophysiologist annually (more often if necessary). This will appear as a REMOTE check on your MyChart schedule. These are automatic and there is nothing for you to manually do unless otherwise instructed.  You should receive your ID card for your new device in 4-8 weeks.  Keep this card with you at all times once received. Consider wearing a medical alert bracelet or necklace.  Your ICD  may be MRI compatible. This will be discussed at your next office visit/wound check.  You should avoid contact with strong electric or magnetic fields.   Do not use amateur (ham) radio equipment or electric (arc) welding torches. MP3 player headphones with magnets should not be used. Some devices are safe to use if held at least 12 inches (30 cm) from your defibrillator. These include power tools, lawn mowers, and speakers. If you are unsure if something is safe to use, ask your health care provider.  When using your cell phone, hold it to the ear that is on the opposite side from the defibrillator. Do not leave your cell phone in a pocket over the defibrillator.  You may safely use electric blankets, heating pads, computers, and microwave ovens.  Call the office right away if: You have chest pain. You feel more than one shock. You feel more short of breath than you have felt before. You feel more light-headed than you have felt before. Your incision starts to open up.  This information is not intended to replace advice given to you by your health care provider. Make sure you discuss any questions you have with your health care provider.

## 2023-12-08 NOTE — TOC CM/SW Note (Signed)
 Transition of Care [facility]) - Inpatient Brief Assessment   Patient Details  Name: Jonathon Cain MRN: [id number] Date of Birth: 1960/07/25  Transition of Care [facility]) CM/SW Contact:    Waddell Barnie Rama, RN Phone Number: 12/08/2023, 10:02 AM   Clinical Narrative: From home alone, has PCP and insurance on file, states has no HH services in place at this time or DME at home.  States family member (brother)  will transport them home at [employer] and family is support system, states gets medications from [facility] in [place].  Pta self ambulatory.   There are no ICM (Inpatient Case Management)  needs identified  at this time.  Please place consult for ICM (Inpatient Case Management)  needs.     Transition of Care Asessment: Insurance and Status: Insurance coverage has been reviewed Patient has primary care physician: Yes Home environment has been reviewed: home alone Prior level of function:: indep Prior/Current Home Services: No current home services Social Drivers of Health Review: SDOH reviewed no interventions necessary Readmission risk has been reviewed: Yes Transition of care needs: no transition of care needs at this time

## 2023-12-08 NOTE — TOC Transition Note (Signed)
 Transition of Care [facility]) - Discharge Note   Patient Details  Name: Jonathon Cain MRN: [id number] Date of Birth: 1961-02-08  Transition of Care [facility]) CM/SW Contact:  Waddell Barnie Rama, RN Phone Number: 12/08/2023, 10:04 AM   Clinical Narrative:    For dc today, he has transportation, has no needs.         Patient Goals and CMS Choice            Discharge Placement                       Discharge Plan and Services Additional resources added to the After Visit Summary for                                       Social Drivers of Health (SDOH) Interventions SDOH Screenings   Food Insecurity: No Food Insecurity (12/08/2023)  Housing: Low Risk  (12/08/2023)  Transportation Needs: Unmet Transportation Needs (12/08/2023)  Utilities: Not At Risk (12/08/2023)  Alcohol Screen: Low Risk  (10/19/2023)  Depression (PHQ2-9): Medium Risk (06/01/2023)  Financial Resource Strain: Low Risk  (10/19/2023)  Physical Activity: Inactive (10/19/2023)  Social Connections: Socially Isolated (12/08/2023)  Stress: Stress Concern Present (10/19/2023)  Tobacco Use: Medium Risk (12/08/2023)  Health Literacy: Adequate Health Literacy (06/01/2023)     Readmission Risk Interventions    12/08/2023   10:02 AM  Readmission Risk Prevention Plan  Post Dischage Appt Complete  Medication Screening Complete  Transportation Screening Complete

## 2023-12-08 NOTE — Discharge Summary (Addendum)
 ELECTROPHYSIOLOGY PROCEDURE DISCHARGE SUMMARY    Patient ID: Jonathon Cain,  MRN: [id number], DOB/AGE: March 02, 1961 63 y.o.  Admit date: 12/07/2023 Discharge date: 12/08/2023  Primary Care Physician: de [place], Quintin PARAS, MD  Primary Cardiologist: Dr. Elmira Electrophysiologist: Dr. kennyth  Primary Discharge Diagnosis:  ICM 2.   Chronic CHF (systolic) 3.   Sinus bradycardia  Secondary Discharge Diagnosis:  CAD (STEMI, PCI 2021) HTN Legally blind  No Known Allergies   Procedures This Admission:  1.  Implantation of a dual chamber ICD on 12/07/23 by Dr kennyth.   DFT's were deferred at time of implant  There were no immediate post procedure complications. 2.  CXR on 12/08/23 demonstrated no pneumothorax status post device implantation.   Brief HPI: Jonathon Cain is a 63 y.o. male was referred to electrophysiology in the outpatient setting for consideration of ICD implantation.  Past medical history includes above.  The patient has persistent LV dysfunction despite guideline directed therapy.  Risks, benefits, and alternatives to ICD implantation were reviewed with the patient who wished to proceed.   Hospital Course:  The patient was admitted and underwent implantation of an ICD with details as outlined in the procedure report. He was monitored on telemetry overnight which demonstrated SR and APacing.  Left  chest was without hematoma or ecchymosis.  The device was interrogated and found to be functioning normally.  CXR was obtained and demonstrated no pneumothorax status post device implantation.  Wound care, arm mobility, and restrictions were reviewed with the patient.  The patient feels well, denies any CP or SOB, minimal site discomfort, he was examined by Dr. kennyth and considered stable for discharge to home.   The patient's discharge medications include an ACE/ARB (entresto ) and beta blocker (metoprolol ).   Physical Exam: Vitals:   12/07/23 2142 12/07/23  2300 12/08/23 0345 12/08/23 0730  BP: 120/68 (!) 112/90 109/66 125/69  Pulse: 66 60 98 (!) 59  Resp: 19 18 19 20   Temp: 98 F (36.7 C) 98.3 F (36.8 C) 98.2 F (36.8 C) 97.8 F (36.6 C)  TempSrc: Oral Oral Oral Oral  SpO2: 100% 97% 98% 96%  Weight: 133.6 kg     Height:        GEN- The patient is well appearing, alert and oriented x 3 today.   HEENT: normocephalic, atraumatic; sclera clear, conjunctiva pink; hearing intact; oropharynx clear Lungs-  CTA b/l, normal work of breathing.  No wheezes, rales, rhonchi Heart- RRR, no murmurs, rubs or gallops, PMI not laterally displaced GI- soft, non-tender, non-distended Extremities- no clubbing, cyanosis, or edema MS- no significant deformity or atrophy Skin- warm and dry, no rash or lesion, left chest without hematoma/ecchymosis Psych- euthymic mood, full affect Neuro- no gross defecits  Labs:   Lab Results  Component Value Date   WBC 15.1 (H) 11/30/2023   HGB 15.2 11/30/2023   HCT 46.7 11/30/2023   MCV 90 11/30/2023   PLT 259 11/30/2023   No results for input(s): NA, K, CL, CO2, BUN, CREATININE, CALCIUM , PROT, BILITOT, ALKPHOS, ALT, AST, GLUCOSE in the last 168 hours.  Invalid input(s): LABALBU  Discharge Medications:  Allergies as of 12/08/2023   No Known Allergies      Medication List     TAKE these medications    aspirin  EC 81 MG tablet TAKE 1 TABLET BY MOUTH DAILY   atorvastatin  80 MG tablet Commonly known as: LIPITOR  TAKE 1 TABLET EVERY NIGHT AT BEDTIME   cetirizine 10  MG tablet Commonly known as: ZYRTEC Take 10 mg by mouth daily as needed for allergies.   empagliflozin  10 MG Tabs tablet Commonly known as: Jardiance  Take 1 tablet (10 mg total) by mouth daily. PLEASE SCHEDULE APPOINTMENT FOR MORE REFILLS   Entresto  49-51 MG Generic drug: sacubitril -valsartan  Take 1 tablet by mouth 2 (two) times daily.   fluticasone 50 MCG/ACT nasal spray Commonly known as: FLONASE Place 2  sprays into both nostrils daily as needed for allergies or rhinitis.   furosemide  40 MG tablet Commonly known as: LASIX  TAKE 1 TABLET BY MOUTH DAILY AS NEEDED FOR LEG SWELLING What changed: See the new instructions.   isosorbide  mononitrate 30 MG 24 hr tablet Commonly known as: IMDUR  TAKE 1/2 TABLET BY MOUTH DAILY   metoprolol  succinate 25 MG 24 hr tablet Commonly known as: TOPROL -XL Take 0.5 tablets (12.5 mg total) by mouth at bedtime. Take with or immediately following a meal.   spironolactone  25 MG tablet Commonly known as: ALDACTONE  Take 1 tablet (25 mg total) by mouth daily.   tamsulosin  0.4 MG Caps capsule Commonly known as: FLOMAX  Take 1 capsule (0.4 mg total) by mouth daily as needed (urine flow).   Xarelto  2.5 MG Tabs tablet Generic drug: rivaroxaban  TAKE 1 TABLET BY MOUTH EVERY MORNING AND EVENING Notes to patient: Do not resume until 12/11/23        Disposition: home Discharge Instructions     Diet - low sodium heart healthy   Complete by: As directed    Increase activity slowly   Complete by: As directed        Signed, Charlies Arthur, PA-C 12/08/2023 10:07 AM  I have seen, examined the patient, and reviewed the above assessment and plan.    Interval:  No acute overnight events. Patient reports feeling relatively well. No new or acute complaints.   General: Well developed, in no acute distress.  Neck: No JVD.  Cardiac: Normal rate, regular rhythm.  Left chest ICD pocket without bleeding or hematoma. Resp: Normal work of breathing.  Ext: No edema.  Neuro: No gross focal deficits.  Psych: Normal affect.   Assessment:  Jonathon Cain is a 64 year old male with past medical history notable for chronic systolic heart failure secondary to ischemic cardiomyopathy who presented on 12/07/2023 for planned dual-chamber ICD implant.  He underwent uncomplicated dual-chamber ICD implant.  He was kept overnight for routine monitoring.  No acute overnight events.  Patient  reports feeling relatively well on day of discharge.  Plan:  -Chest x-ray with appropriately position and no pneumothorax. -Bedside device interrogation was performed with appropriate device function and stable lead parameters. -Usual post implant discharge instructions provided regarding activity restrictions and wound care. -Follow-up in device clinic in 10 to 14 days. -Resume Xarelto  in 4 days.  Fonda Kitty, MD 12/08/2023 10:35 PM

## 2023-12-12 ENCOUNTER — Other Ambulatory Visit: Payer: Self-pay | Admitting: Cardiology

## 2023-12-12 DIAGNOSIS — I739 Peripheral vascular disease, unspecified: Secondary | ICD-10-CM

## 2023-12-19 ENCOUNTER — Ambulatory Visit: Payer: Self-pay | Admitting: Cardiology

## 2023-12-20 ENCOUNTER — Other Ambulatory Visit (HOSPITAL_BASED_OUTPATIENT_CLINIC_OR_DEPARTMENT_OTHER): Payer: Self-pay | Admitting: Family Medicine

## 2023-12-20 DIAGNOSIS — E349 Endocrine disorder, unspecified: Secondary | ICD-10-CM

## 2023-12-21 ENCOUNTER — Ambulatory Visit: Payer: Medicare (Managed Care) | Attending: Cardiology

## 2023-12-21 DIAGNOSIS — I255 Ischemic cardiomyopathy: Secondary | ICD-10-CM

## 2023-12-21 LAB — CUP PACEART INCLINIC DEVICE CHECK
Date Time Interrogation Session: 20250930162239
HighPow Impedance: 68 Ohm
Implantable Lead Connection Status: 753985
Implantable Lead Connection Status: 753985
Implantable Lead Implant Date: 20250916
Implantable Lead Implant Date: 20250916
Implantable Lead Location: 753859
Implantable Lead Location: 753860
Implantable Lead Model: 673
Implantable Lead Model: 7841
Implantable Lead Serial Number: 1657605
Implantable Lead Serial Number: 280603
Implantable Pulse Generator Implant Date: 20250916
Lead Channel Impedance Value: 484 Ohm
Lead Channel Impedance Value: 648 Ohm
Lead Channel Pacing Threshold Amplitude: 0.7 V
Lead Channel Pacing Threshold Amplitude: 0.8 V
Lead Channel Pacing Threshold Pulse Width: 0.4 ms
Lead Channel Pacing Threshold Pulse Width: 0.4 ms
Lead Channel Sensing Intrinsic Amplitude: 1.7 mV
Lead Channel Sensing Intrinsic Amplitude: 21.7 mV
Lead Channel Setting Pacing Amplitude: 3.5 V
Lead Channel Setting Pacing Amplitude: 3.5 V
Lead Channel Setting Pacing Pulse Width: 0.4 ms
Lead Channel Setting Sensing Sensitivity: 0.5 mV
Pulse Gen Serial Number: 710861
Zone Setting Status: 755011

## 2023-12-21 NOTE — Progress Notes (Signed)
 Normal dual chamber ICD wound check. Wound well healed. Presenting rhythm: AP/VS 63 . Routine testing performed. Thresholds, sensing, and impedance consistent with implant measurements with 3.5V safety margin/auto capture until 3 month visit. No treated arrhythmias. Reviewed arm restrictions to continue for 6 weeks total post op. Reviewed shock plan.  Pt enrolled in remote follow-up.

## 2023-12-21 NOTE — Patient Instructions (Signed)

## 2023-12-23 ENCOUNTER — Ambulatory Visit: Payer: Self-pay | Admitting: Cardiology

## 2023-12-28 ENCOUNTER — Telehealth: Payer: Self-pay

## 2023-12-28 NOTE — Telephone Encounter (Signed)
 Please confirm current medications that patient is taking so I can offer further recommendations.  Thanks MJP

## 2023-12-28 NOTE — Telephone Encounter (Signed)
 MyChart message containing providers note and interpretation read by patient on: Last read by Arley Lemond Breen at 5:34PM on 12/28/2023.

## 2023-12-28 NOTE — Telephone Encounter (Signed)
 Pt seen last week in device clinic.  During this appointment Pt complained of ongoing fatigue.  Reports that at times his blood pressure is low.  Requested Pt take his BP daily x 7 days and send back these readings to give to his primary cardiologist Dr. Elmira.  Pt sent following readings:  Here are 10 most recent BP results 96 61 101 66 100 66 84 50 98 59  89 59 79 53 90 61  Will forward to general cardiologist for review and advisement.

## 2023-12-28 NOTE — Telephone Encounter (Signed)
 Hold lasix  for next couple days. Cut spironolactone  in half. Ensure 2 L fluid intake.  May be best to see in the office in the next few days to re evaluate.  Thanks MJP

## 2023-12-30 ENCOUNTER — Ambulatory Visit: Payer: Medicare (Managed Care) | Admitting: Cardiovascular Disease

## 2024-01-05 ENCOUNTER — Telehealth: Payer: Self-pay | Admitting: *Deleted

## 2024-01-05 NOTE — Telephone Encounter (Signed)
 Spoke with patient to see if he is able to come in at 8:25am tomorrow. He reports he has another appointment at that time and would prefer to keep his 2:45pm appointment. Confirmed no changes to his scheduled appointment.

## 2024-01-05 NOTE — Progress Notes (Unsigned)
 Cardiology Office Note:    Date:  01/05/2024   ID:  Jawad, Wiacek [birth year], MRN [id number]  PCP:  de [place], Raymond J, MD   [facility] Providers Cardiologist:  Newman JINNY Lawrence, MD Electrophysiologist:  Fonda Kitty, MD { Click to update primary MD,subspecialty MD or APP then REFRESH:1}    Referring MD: de [place], Quintin JINNY, MD   No chief complaint on file. ***  History of Present Illness:    Jonathon Cain is a 63 y.o. male with a hx of   Past Medical History:  Diagnosis Date   Allergy    Basal cell carcinoma 09/03/2020   sup & nod- Left upper arm-posterior ([id number])   Bladder outlet obstruction    Cancer (HCC)    Skin   CHF (congestive heart failure) (HCC)    Coronary artery disease    Hyperlipidemia    Hypertension    Melanoma (HCC) 09/03/2020   in situ- Left upper back (EXC)   Retinitis pigmentosa     Past Surgical History:  Procedure Laterality Date   ABDOMINAL AORTOGRAM W/LOWER EXTREMITY N/A 03/19/2020   Procedure: ABDOMINAL AORTOGRAM W/LOWER EXTREMITY;  Surgeon: Lawrence Newman JINNY, MD;  Location: [facility];  Service: Cardiovascular;  Laterality: N/A;   CARDIAC CATHETERIZATION     CATARACT EXTRACTION Bilateral 2004   CORONARY BALLOON ANGIOPLASTY N/A 11/14/2019   Procedure: CORONARY BALLOON ANGIOPLASTY;  Surgeon: Lawrence Newman JINNY, MD;  Location: [facility];  Service: Cardiovascular;  Laterality: N/A;   CORONARY PRESSURE/FFR STUDY N/A 11/14/2019   Procedure: INTRAVASCULAR PRESSURE WIRE/FFR STUDY;  Surgeon: Lawrence Newman JINNY, MD;  Location: [facility];  Service: Cardiovascular;  Laterality: N/A;   CORONARY PRESSURE/FFR STUDY N/A 09/16/2021   Procedure: INTRAVASCULAR PRESSURE WIRE/FFR STUDY;  Surgeon: Lawrence Newman JINNY, MD;  Location: [facility];  Service: Cardiovascular;  Laterality: N/A;   CORONARY STENT INTERVENTION N/A 07/26/2019   Procedure: CORONARY STENT INTERVENTION;  Surgeon: Ladona Heinz, MD;   Location: [facility];  Service: Cardiovascular;  Laterality: N/A;   CORONARY ULTRASOUND/IVUS N/A 11/14/2019   Procedure: Intravascular Ultrasound/IVUS;  Surgeon: Lawrence Newman JINNY, MD;  Location: [facility];  Service: Cardiovascular;  Laterality: N/A;   CORONARY/GRAFT ACUTE MI REVASCULARIZATION N/A 07/26/2019   Procedure: CORONARY/GRAFT ACUTE MI REVASCULARIZATION;  Surgeon: Ladona Heinz, MD;  Location: [facility];  Service: Cardiovascular;  Laterality: N/A;   EYE SURGERY     ICD IMPLANT N/A 12/07/2023   Procedure: ICD IMPLANT;  Surgeon: Kitty Fonda, MD;  Location: [facility];  Service: Cardiovascular;  Laterality: N/A;   LEFT HEART CATH AND CORONARY ANGIOGRAPHY N/A 07/26/2019   Procedure: LEFT HEART CATH AND CORONARY ANGIOGRAPHY;  Surgeon: Ladona Heinz, MD;  [facility]: [facility];  Service: Cardiovascular;  Laterality: N/A;   LOWER EXTREMITY ANGIOGRAPHY Left 05/21/2020   Procedure: LOWER EXTREMITY ANGIOGRAPHY;  Surgeon: Lawrence Newman JINNY, MD;  [facility]: [facility];  Service: Cardiovascular;  Laterality: Left;   PERIPHERAL VASCULAR BALLOON ANGIOPLASTY Right 03/19/2020   Procedure: PERIPHERAL VASCULAR BALLOON ANGIOPLASTY;  Surgeon: Lawrence Newman JINNY, MD;  Location: [facility];  Service: Cardiovascular;  Laterality: Right;  SFA   PERIPHERAL VASCULAR BALLOON ANGIOPLASTY  05/21/2020   Procedure: PERIPHERAL VASCULAR BALLOON ANGIOPLASTY;  Surgeon: Lawrence Newman JINNY, MD;  Location: [facility];  Service: Cardiovascular;;  left SFA   PERIPHERAL VASCULAR INTERVENTION Bilateral 03/19/2020   Procedure: PERIPHERAL VASCULAR INTERVENTION;  Surgeon: Elmira Newman PARAS, MD;  Location: [facility];  Service: Cardiovascular;  Laterality: Bilateral;  External Iliac   RIGHT/LEFT HEART CATH AND CORONARY ANGIOGRAPHY N/A 09/16/2021   Procedure: RIGHT/LEFT HEART CATH AND CORONARY ANGIOGRAPHY;  Surgeon: Elmira Newman PARAS, MD;  [facility]: [facility];  Service: Cardiovascular;  Laterality: N/A;   TUMOR REMOVAL  1961   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    Current Medications: No outpatient medications have been marked as taking for the 01/06/24 encounter (Appointment) with Jonathon Jon Garre, PA.     Allergies:   Patient has no known allergies.   Social History   Socioeconomic History   Marital status: Single    Spouse name: Not on file   Number of children: 0   Years of education: 16   Highest education level: Bachelor's degree (e.g., BA, AB, BS)  Occupational History   Not on file  Tobacco Use   Smoking status: Former    Current packs/day: 0.00    Average packs/day: 1.5 packs/day for 40.0 years (60.0 ttl pk-yrs)    Types: Cigarettes    Start date: 07/26/1979    Quit date: 07/26/2019    Years since quitting: 4.4    Passive exposure: Past   Smokeless tobacco: Never  Vaping Use   Vaping status: Never Used  Substance and Sexual Activity   Alcohol use: Not Currently    Comment: occasional   Drug use: No   Sexual activity: Not Currently  Other Topics Concern   Not on file  Social History Narrative   Not on file   Social Drivers of Health   Financial Resource Strain: Low Risk  (10/19/2023)   Overall Financial Resource Strain (CARDIA)    Difficulty of Paying Living Expenses: Not very hard  Food Insecurity: No Food Insecurity (12/08/2023)   Hunger Vital Sign    Worried About Running Out of Food in the Last Year: Never true    Ran Out of Food in the Last Year: Never true  Transportation Needs: Unmet Transportation Needs (12/08/2023)   PRAPARE - Transportation    Lack of Transportation (Medical): Yes    Lack of Transportation (Non-Medical): Patient unable to answer  Physical Activity: Inactive (10/19/2023)   Exercise Vital Sign    Days of Exercise per Week: 0 days    Minutes of Exercise per Session: Not on file  Stress: Stress Concern Present (10/19/2023)   [employer] of Occupational Health -  Occupational Stress Questionnaire    Feeling of Stress: Rather much  Social Connections: Socially Isolated (12/08/2023)   Social Connection and Isolation Panel    Frequency of Communication with Friends and Family: More than three times a week    Frequency of Social Gatherings with Friends and Family: Once a week    Attends Religious Services: Never    Database administrator or Organizations: No    Attends Engineer, structural: Never    Marital Status: Never married     Family History: The patient's ***family history includes Asthma in his mother; Dementia in his mother; Heart disease in his father, maternal grandfather, maternal grandmother, mother, paternal grandfather, and paternal grandmother; Hyperlipidemia in his mother; Hypertension in his father.  ROS:   Please see the history of present illness.    *** All other systems reviewed and are negative.  EKGs/Labs/Other Studies Reviewed:    The following studies were reviewed today: ***      Recent Labs: 04/08/2023: TSH 3.580 07/06/2023:  BNP 168.1 10/20/2023: ALT 31 11/08/2023: NT-Pro BNP 415 11/30/2023: BUN 18; Creatinine, Ser 1.24; Hemoglobin 15.2; Platelets 259; Potassium 4.1; Sodium 137  Recent Lipid Panel    Component Value Date/Time   CHOL 115 04/08/2023 1137   TRIG 121 04/08/2023 1137   HDL 33 (L) 04/08/2023 1137   CHOLHDL 3.5 04/08/2023 1137   CHOLHDL 6.4 07/26/2019 1854   VLDL 19 07/26/2019 1854   LDLCALC 60 04/08/2023 1137     Risk Assessment/Calculations:   {Does this patient have ATRIAL FIBRILLATION?[phone]}  No BP recorded.  {Refresh Note OR Click here to enter BP  :1}***         Physical Exam:    VS:  There were no vitals taken for this visit.    Wt Readings from Last 3 Encounters:  12/07/23 294 lb 8.6 oz (133.6 kg)  11/08/23 299 lb (135.6 kg)  10/26/23 299 lb 1.6 oz (135.7 kg)     GEN: *** Well nourished, well developed in no acute distress HEENT: Normal NECK: No JVD; No carotid  bruits LYMPHATICS: No lymphadenopathy CARDIAC: ***RRR, no murmurs, rubs, gallops RESPIRATORY:  Clear to auscultation without rales, wheezing or rhonchi  ABDOMEN: Soft, non-tender, non-distended MUSCULOSKELETAL:  No edema; No deformity  SKIN: Warm and dry NEUROLOGIC:  Alert and oriented x 3 PSYCHIATRIC:  Normal affect   ASSESSMENT:    No diagnosis found. PLAN:    In order of problems listed above:  ***      {Are you ordering a CV Procedure (e.g. stress test, cath, DCCV, TEE, etc)?   Press F2        :[id number]}    Medication Adjustments/Labs and Tests Ordered: Current medicines are reviewed at length with the patient today.  Concerns regarding medicines are outlined above.  No orders of the defined types were placed in this encounter.  No orders of the defined types were placed in this encounter.   There are no Patient Instructions on file for this visit.   Signed, Jon Cain Markiah Janeway, PA  01/05/2024 10:12 AM    [facility]

## 2024-01-06 ENCOUNTER — Encounter (HOSPITAL_BASED_OUTPATIENT_CLINIC_OR_DEPARTMENT_OTHER): Payer: Self-pay | Admitting: Family Medicine

## 2024-01-06 ENCOUNTER — Ambulatory Visit: Payer: Medicare (Managed Care) | Admitting: Nurse Practitioner

## 2024-01-06 ENCOUNTER — Encounter: Payer: Self-pay | Admitting: Physician Assistant

## 2024-01-06 ENCOUNTER — Ambulatory Visit: Payer: Medicare (Managed Care) | Attending: Physician Assistant | Admitting: Cardiology

## 2024-01-06 ENCOUNTER — Other Ambulatory Visit (HOSPITAL_BASED_OUTPATIENT_CLINIC_OR_DEPARTMENT_OTHER): Payer: Self-pay | Admitting: *Deleted

## 2024-01-06 ENCOUNTER — Ambulatory Visit (INDEPENDENT_AMBULATORY_CARE_PROVIDER_SITE_OTHER): Payer: Medicare (Managed Care) | Admitting: Family Medicine

## 2024-01-06 ENCOUNTER — Ambulatory Visit (HOSPITAL_BASED_OUTPATIENT_CLINIC_OR_DEPARTMENT_OTHER)
Admission: RE | Admit: 2024-01-06 | Discharge: 2024-01-06 | Disposition: A | Discharge status: Home / Self Care | Payer: Medicare (Managed Care) | Source: Ambulatory Visit | Attending: Acute Care | Admitting: Acute Care

## 2024-01-06 VITALS — BP 100/68 | HR 60 | Ht 70.5 in | Wt 299.6 lb

## 2024-01-06 VITALS — BP 100/58 | HR 68 | Ht 71.0 in | Wt 299.0 lb

## 2024-01-06 DIAGNOSIS — E349 Endocrine disorder, unspecified: Secondary | ICD-10-CM | POA: Insufficient documentation

## 2024-01-06 DIAGNOSIS — I255 Ischemic cardiomyopathy: Secondary | ICD-10-CM | POA: Diagnosis not present

## 2024-01-06 DIAGNOSIS — I5020 Unspecified systolic (congestive) heart failure: Secondary | ICD-10-CM

## 2024-01-06 DIAGNOSIS — Z87891 Personal history of nicotine dependence: Secondary | ICD-10-CM | POA: Insufficient documentation

## 2024-01-06 DIAGNOSIS — I25118 Atherosclerotic heart disease of native coronary artery with other forms of angina pectoris: Secondary | ICD-10-CM

## 2024-01-06 DIAGNOSIS — I272 Pulmonary hypertension, unspecified: Secondary | ICD-10-CM | POA: Diagnosis not present

## 2024-01-06 DIAGNOSIS — I739 Peripheral vascular disease, unspecified: Secondary | ICD-10-CM

## 2024-01-06 DIAGNOSIS — Z122 Encounter for screening for malignant neoplasm of respiratory organs: Secondary | ICD-10-CM | POA: Diagnosis present

## 2024-01-06 NOTE — Assessment & Plan Note (Signed)
 Patient had labs previously with cardiologist before planned ICD implantation.  He was found to have an elevated calcitonin which was subsequently forwarded to me.  On review, it appears that patient had some concerns related to infection and provider had indicated plan to check procalcitonin, however Calcitonin was checked.  Patient not Nestl having any concerning symptoms related to this elevation.  Difficult to fully assess significance of this seemingly mild elevation given lack of any other symptoms or concerns.  We can proceed with recheck of calcitonin at this time If still elevated, likely proceed with referral to endocrinology for further recommendations

## 2024-01-06 NOTE — Progress Notes (Signed)
    Procedures performed today:    None.  Independent interpretation of notes and tests performed by another provider:   None.  Brief History, Exam, Impression, and Recommendations:    BP 100/68 (BP Location: Left Arm, Patient Position: Sitting, Cuff Size: Large)   Pulse 60   Ht 5' 10.5 (1.791 m)   Wt 299 lb 9.6 oz (135.9 kg)   SpO2 96%   BMI 42.38 kg/m   Patient had recent hospitalization last month related to implantation of dual-chamber ICD.  He tolerated procedure well and was discharged after overnight observation.  He has had follow-up with routine healing noted.  Testing of ICD performed within cardiology office and was normal at that time.  Elevated calcitonin level Assessment & Plan: Patient had labs previously with cardiologist before planned ICD implantation.  He was found to have an elevated calcitonin which was subsequently forwarded to me.  On review, it appears that patient had some concerns related to infection and provider had indicated plan to check procalcitonin, however Calcitonin was checked.  Patient not Nestl having any concerning symptoms related to this elevation.  Difficult to fully assess significance of this seemingly mild elevation given lack of any other symptoms or concerns.  We can proceed with recheck of calcitonin at this time If still elevated, likely proceed with referral to endocrinology for further recommendations   HFrEF (heart failure with reduced ejection fraction) (HCC)  Return if symptoms worsen or fail to improve.   ___________________________________________ Agustine Rossitto de [place], MD, ABFM, CAQSM Primary Care and Sports Medicine [facility]

## 2024-01-06 NOTE — Progress Notes (Signed)
 Cardiology Office Note:  .   Date:  01/06/2024  ID:  Jonathon Cain, DOB 1960-05-06, MRN [id number] PCP: de Peru, Raymond J, MD  [facility] Providers Cardiologist:  Newman JINNY Lawrence, MD Electrophysiologist:  Fonda Kitty, MD {  History of Present Illness: Jonathon   Baltasar Cain is a 63 y.o. 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 10/2019 balloon angioplasty performed to prox LAD. 08/2020 lexiscan  reported worsening dyspnea chest discomfort.  Large size fixed perfusion defect with with severe myocardial thickening. 08/2021 reported continued anginal symptoms so had R and LHC. Patent LAD stent with 40% late lumen loss, RFR 0.94, nonsignificant.  OM1 50% stenosis which was improved from prior.  Mild to moderate disease in RCA.  No intervention.  ICM Noted since the time of his anterior STEMI 07/2019.  EF 20 to 25% 10/2019 EF 30% 12/2019 cardiac MRI with LAD infarct, greater than 50% transfer LGE suggesting nonviable walls.  EF 36%, RV normal.  Moderate MR. 08/2020 EF 35%, G3 DD 08/2021 right left heart catheterization. LHC overall unchanged.  No intervention.  Low normal cardiac output with severe pulmonary hypertension RV 78/4.  PA 72/23.  mPAP 40.  PCW 24, LVEDP 23. Not felt to be candidate for barostim given anatomy.  11/2021 EF 25 to 30%.  Normal RV function 12/2022 echo with poor visualization. Cardiac MRI EF 30%, RV 46% LAD infarct.  X-ray volume percentage 33% suggesting increased myocardial fibrotic content/scarring. 04/2023 EF 25%. 09/2023 EF 35 to 40% 11/2023 ICD implant   Social history Lives alone.  Brother takes him to his appointments. Legally blind, ambulates with a cane for assistance.     Patient with history of anterior STEMI with stent placed to his LAD and ICM with persistently reduced EF that is been depressed since 2021.  Patient was seen 10/2023 reporting to have low normal blood pressure but without any any symptoms of dizziness but did have increased exertional dyspnea.  proBNP 415.  No changes.  He had seen Dr. Kitty earlier that month and recommended dual-chamber ICD placement given persistent LV reduced EF and history of sinus bradycardia.  Had ICD implanted 11/2023. He then sent staff message to us  reporting continued hypotension.  Primary cardiologist directed patient to hold Lasix  for a couple days and to cut spironolactone  in half and to follow-up in the office.  12/28/2023 readings (prior to med changes) 96 61 101 66 100 66 84 50 98 59  89 59 79 53 90 61  Today patient returns for complaints of hypotension.  He has been reporting blood pressure between 90-100 persistently now for multiple years with some lower readings as above.  He denies any associated symptoms of syncope, falls.  He notes rare episodes of dizziness however nothing significant and generally short-lived.  Been compliant with all of his GDMT and overall has been tolerating this well.  Does not note any issues with orthopnea, PND, peripheral edema.  Weight has been persistently stable around 300.  He is making a stronger effort to lose weight as he does not want to be greater than 300 pounds.  He does note persistent fatigue chronically that has been unchanged for years now.  ROS: Denies:  Chest pain, shortness of breath, orthopnea, peripheral edema, palpitations, decreased exercise intolerance, lightheadedness.   Studies Reviewed: .         Risk Assessment/Calculations:        Physical Exam:   VS:  BP (!) 100/58   Pulse 68   Ht 5' 11 (1.803 m)   Wt 299 lb (135.6 kg)   SpO2 96%   BMI 41.70 kg/m    Wt Readings from Last 3 Encounters:  01/06/24 299 lb (135.6 kg)  01/06/24 299 lb 9.6 oz  (135.9 kg)  12/07/23 294 lb 8.6 oz (133.6 kg)    GEN: Well nourished, well developed in no acute distress NECK: No JVD; No carotid bruits CARDIAC: RRR, no murmurs, rubs, gallops RESPIRATORY:  Clear to auscultation without rales, wheezing or rhonchi  ABDOMEN: Soft, non-tender, non-distended EXTREMITIES:  No edema; No deformity   ASSESSMENT AND PLAN: .    ICM with reduced EF Hypotension -Reduced EF since 2021 at the time of anterior STEMI -09/2023 EF 35 to 40% with normal RV - 11/2023 [facility] dual-chamber ICD implanted EF has improved now 35 to 40%, previously 20 to 25%.  He is on maximally tolerated GDMT given limited blood pressure room.  Overall with NYHA class II symptoms, euvolemic. Continue with Entresto  49-51 mg twice daily, Jardiance  10 mg, spironolactone  12.5 mg (recently reduced), Toprol -XL 12.5 mg, Imdur  15 mg, Lasix  40 mg as needed. He has had complaints of hypotension however chronically has borderline low normal blood pressure generally 90s to 100 systolic.  In the absence of any significant symptoms would continue current regiment to maximize GDMT.  Feels warm and perfused on exam.  CAD -07/2019 anterior STEMI stent placed to LAD -08/2021 patent LAD stent with 40% late lumen loss, RFR 0.94, non significant. Overall stable disease without any anginal complaints. Continue with aspirin , atorvastatin  80 mg, Toprol -XL 12.5 mg, Imdur  15 mg.  LDL is 60 03/2023.  Goal should be less than 55 however since this is close to target goal we will maximize lifestyle modifications with weight loss and dietary improvements along with increase in exercise.  PAD S/p PCI to b/l CIA and right SFA ABI 08/2021 normal perfusion of left extremity, improved.  No change in the right.  Denies any claudication symptoms.  Has no diminished pulses. Continue with aspirin  and low-dose Xarelto  2.5 mg twice daily.  Pulmonary hypertension He has a right left heart catheterization 08/2021 that shows  signs of group 2 pulmonary hypertension with elevated LVEDP 23, wedge pressure of 24, RA 10, RV 78/4, PA 72/23, and mPAP 40.  Unfortunately serial echocardiograms have not been able to measure RVSP as TR has not been available.  He will see his cardiologist next month to discuss whether this needs further workup or if right heart catheterization is indicated. He has sleep study 02/2023 indicating mild OSA.  BMI 41.7 We discussed need for weight loss and increase in activity.  He is limited by his ambulates with cane but still has room for improvement.    Dispo: Follow-up with primary cardiologist next month as scheduled.  Signed, Thom LITTIE Sluder, PA-C

## 2024-01-06 NOTE — Patient Instructions (Signed)
 Medication Instructions:  Your physician recommends that you continue on your current medications as directed. Please refer to the Current Medication list given to you today.  *If you need a refill on your cardiac medications before your next appointment, please call your pharmacy*  Lab Work: None ordered If you have labs (blood work) drawn today and your tests are completely normal, you will receive your results only by: MyChart Message (if you have MyChart) OR A paper copy in the mail If you have any lab test that is abnormal or we need to change your treatment, we will call you to review the results.  Testing/Procedures: None ordered  Follow-Up: Keep upcoming appointments

## 2024-01-06 NOTE — Patient Instructions (Signed)

## 2024-01-08 LAB — CALCITONIN: Calcitonin: 8 pg/mL (ref 0.0–8.4)

## 2024-01-11 ENCOUNTER — Ambulatory Visit (HOSPITAL_BASED_OUTPATIENT_CLINIC_OR_DEPARTMENT_OTHER): Payer: Self-pay | Admitting: Family Medicine

## 2024-01-12 ENCOUNTER — Other Ambulatory Visit: Payer: Self-pay

## 2024-01-12 ENCOUNTER — Telehealth: Payer: Self-pay | Admitting: Acute Care

## 2024-01-12 DIAGNOSIS — Z122 Encounter for screening for malignant neoplasm of respiratory organs: Secondary | ICD-10-CM

## 2024-01-12 DIAGNOSIS — Z87891 Personal history of nicotine dependence: Secondary | ICD-10-CM

## 2024-01-12 DIAGNOSIS — F17210 Nicotine dependence, cigarettes, uncomplicated: Secondary | ICD-10-CM

## 2024-01-12 NOTE — Telephone Encounter (Signed)
 Result letter sent to patient. Annual CT order placed. Results to PCP.

## 2024-01-12 NOTE — Telephone Encounter (Signed)
 Results reviewed by Lauraine Lites, NP with the following recommendations:  LR2 due for annual 2026 follow up LDCT.  Cardiac findings and notes regarding hx melanoma are being managed by specialty providers, which are appropriate, and nothing further needed.  Relay results to patient and fax copy of results to PCP.

## 2024-01-13 ENCOUNTER — Inpatient Hospital Stay (HOSPITAL_BASED_OUTPATIENT_CLINIC_OR_DEPARTMENT_OTHER): Payer: Medicare (Managed Care) | Admitting: Family Medicine

## 2024-01-14 NOTE — Progress Notes (Signed)
 " Cardiology Office Note   Date:  01/18/2024  ID:  Jonathon, Cain Sep 16, 1960, MRN [id number] PCP: de Cain, Jonathon J, MD  [facility] Providers Cardiologist:  Jonathon JINNY Lawrence, MD Electrophysiologist:  Jonathon Kitty, MD     History of Present Illness Jonathon Cain is a 63 y.o. 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  12/21/2023 device check a paced V sensed 12/07/2023 ICD implantation  09/28/2023 echo EF 35 to 40%, moderately decreased LV function with global hypokinesis, grade 1 DD 04/28/2023 echo EF 20 to 25%, positive RWMA, mild concentric LVH, grade 3 DD 01/05/2023 cardiac MRI delayed enhancement pattern suggestive of prior LAD territory MI 11/03/2022 monitor for episodes of VT, 77 episodes of SVT/atrial tachycardia, no high-grade pauses 12/02/2021 carotid duplex Right ICA 1 to 39% stenosed, near normal left ICA 09/16/2021 right left heart cath patent proximal LAD stent, nonobstructive elsewhere 11/14/2019 cardiac cath balloon angioplasty to previously placed stents in his proximal LAD 07/27/2019 echo EF 20-25% 07/26/2019 cardiac cath PTCA DES to proximal LAD with overlapping stents  In 2021 he had an anterior STEMI with PCI to his LAD, associated ischemic cardiomyopathy at that time as low as 20% >> PTCA with DES with overlapping stents to proximal LAD >> a few months later cardiac cath with balloon angioplasty to previously placed stents in the proximal LAD.  Previously wore a LifeVest in the past.  Fluctuations with his EF over the years but it does not appear it has gotten above 40%, some difficulties with uptitration of GDMT secondary to hypotension.  He had been evaluated by the advanced heart failure team at some point, ultimately was referred to EP for ICD consideration and subsequently underwent ICD  implantation on 12/07/2023.  Most recently evaluated by Jonathon Sluder PA on 01/06/2024 with concerns of hypotension, his blood pressure had been running between 90 and 100 systolic for several years however he was overall asymptomatic outside of dizziness, tolerating GDMT well.  He presents today for follow-up, he is not entirely sure why he was advised to come back in a week, he does have an appointment with his primary cardiologist in about 3 weeks.  He does not have any formal complaints today outside of the normal for him.  He is bothered by fatigue, this is persistent and ongoing.  His blood pressure is low in the office today at 80/52, this is also persistent and ongoing and he states if he was given a cup of water his blood pressure would return.  He denies orthostasis, dizziness, near syncopal sensation.  He is well versed  in his heart failure and his symptoms. Not currently weighing daily as his scale is broken, but he typically will get abdominal tightness when he is having an exacerbation. He denies chest pain, palpitations, dyspnea, pnd, orthopnea, n, v, dizziness, syncope, edema, weight gain, or early satiety.    ROS: Review of Systems  Constitutional:  Positive for malaise/fatigue.  All other systems reviewed and are negative.    Studies Reviewed      Cardiac Studies & Procedures   ______________________________________________________________________________________________ CARDIAC CATHETERIZATION  CARDIAC CATHETERIZATION 09/16/2021  Conclusion Images from the original result were not included. LM: Normal LAD: Patent prox LAD stent with 40% late lumen loss RFR 0.94 (physiologically non-significant) Lcx: OM1 ostial 50% stenosis (Improved from previous  cath in 2021-80% with negative RFR then) RCA: Distal 20%, ostial PDA 50% stenoses  LV: 121/11 mmHg LVEDP: 23 mmHg  RA: 10 mmHg RV: 78/4 mmHg PA: 72/23 mmHg, mPAP 40 mmHg PCW: 24 mmHg  CO: 6.0 L/min CI: 2.4 L/min/m2  No  indication for revascularization. Continue optimization of GDMT for HFrEF   Jonathon JINNY Lawrence, MD Pager: [phone] Office: [phone]  Findings Coronary Findings Diagnostic  Dominance: Right  Left Anterior Descending Prox LAD lesion is 40% stenosed. The lesion was previously treated . Pressure wire/FFR was performed on the lesion. FFR: 0.  Left Circumflex  First Obtuse Marginal Branch Vessel is large in size. 1st Mrg lesion is 50% stenosed. 0.98  Right Coronary Artery Dist RCA lesion is 20% stenosed.  Right Posterior Descending Artery RPDA lesion is 50% stenosed.  Intervention  No interventions have been documented.   CARDIAC CATHETERIZATION  CARDIAC CATHETERIZATION 11/14/2019  Conclusion Images from the original result were not included.   Balloon angioplasty was performed.  LM: Diffuse 30% disease. IVUS MLA 7.3 mm2,. dFR 1.0 LAD: Patent prox LAD stent with udner expansion Optimization with 3.5X15 mm [place] balloon at 22 atm Excellent expansion as seen on IVUS TIMI flow II-->III Diag: Mod diffuse disease LCx: 80% ostial OM1 stenosis. dFR 0.98 RCA: Not engaged today   Jonathon JINNY Lawrence, MD Pager: [phone] Office: [phone]  Findings Coronary Findings Diagnostic  Dominance: Right  Left Main Ost LM to Mid LM lesion is 30% stenosed. Pressure wire/FFR was performed on the lesion. FFR: 1. Ultrasound (IVUS) was performed. IVS MLA 7.3 mm2  Left Anterior Descending Prox LAD lesion is 40% stenosed. The lesion was previously treated.  Left Circumflex  First Obtuse Marginal Branch Vessel is large in size. 1st Mrg lesion is 80% stenosed. Pressure wire/FFR was performed on the lesion. 0.98  Right Coronary Artery Dist RCA lesion is 20% stenosed.  Right Posterior Descending Artery RPDA lesion is 50% stenosed.  Intervention  Prox LAD lesion Angioplasty Balloon angioplasty was performed. Angiographically, there is 0% residual stenosis.  IVUS  showed under expansion of the stent with MSD 2.9 mm. I optimized the stent using 3.5X15 mm  balloon with excellent expansion, MSD 3.4 mm and TIMI flow II-->III. Post-Intervention Lesion Assessment There is a 0% residual stenosis post intervention.   STRESS TESTS  PCV MYOCARDIAL PERFUSION WO LEXISCAN  09/18/2020  Narrative Lexiscan  Tetrofosmin  stress test 09/18/2020: Lexiscan  nuclear stress test performed using 1-day protocol. Patient experienced dyspnea and chest discomfort SPECT images showed large sized, moderate intensity, fixed perfusion defect in mid to apical, anterior, inferior, and apical myocardium. There is severe decrease in myocardial thickening and wall motion. Stress LVEF 12%. High risk study.   ECHOCARDIOGRAM  ECHOCARDIOGRAM COMPLETE 09/28/2023  Narrative ECHOCARDIOGRAM REPORT    Patient Name:   Jonathon Cain Date of Exam: 09/28/2023 Medical Rec #:  [id number]        Height:       71.0 in Accession #:    [id number]       Weight:       296.8 lb Date of Birth:  17-Feb-1961        BSA:          2.494 m Patient Age:    63 years         BP:           100/66 mmHg Patient Gender: M                HR:  64 bpm. Exam Location:  [address]  Procedure: 2D Echo, Cardiac Doppler, Color Doppler and Intracardiac Opacification Agent (Both Spectral and Color Flow Doppler were utilized during procedure).  Indications:    I25.10 Coronary artery disease.  History:        Patient has prior history of Echocardiogram examinations, most recent 04/28/2023. CHF, CAD; Risk Factors:Hypertension and Dyslipidemia.  Sonographer:    Carl Coma RDCS Referring Phys: [id number] [facility] J PATWARDHAN   Sonographer Comments: Technically difficult study due to poor echo windows. IMPRESSIONS   1. Left ventricular ejection fraction, by estimation, is 35 to 40%. The left ventricle has moderately decreased function. The left ventricle demonstrates global hypokinesis. The left  ventricular internal cavity size was mildly dilated. Left ventricular diastolic parameters are consistent with Grade I diastolic dysfunction (impaired relaxation). 2. Right ventricular systolic function is normal. The right ventricular size is normal. Tricuspid regurgitation signal is inadequate for assessing PA pressure. 3. The mitral valve is grossly normal. Trivial mitral valve regurgitation. No evidence of mitral stenosis. 4. The aortic valve was not well visualized. Aortic valve regurgitation is not visualized. No aortic stenosis is present. 5. The inferior vena cava is normal in size with greater than 50% respiratory variability, suggesting right atrial pressure of 3 mmHg.  Comparison(s): A prior study was performed on 04/28/2023. LV cavity is less dilated, Grade III diastolic dysfunction is now Grade I. Recommend cMRI or different imaging modality to accurately evaluate the LVEF.  Conclusion(s)/Recommendation(s): No left ventricular mural or apical thrombus/thrombi.  FINDINGS Left Ventricle: Left ventricular ejection fraction, by estimation, is 35 to 40%. The left ventricle has moderately decreased function. The left ventricle demonstrates global hypokinesis. Definity  contrast agent was given IV to delineate the left ventricular endocardial borders. The left ventricular internal cavity size was mildly dilated. There is no left ventricular hypertrophy. Left ventricular diastolic parameters are consistent with Grade I diastolic dysfunction (impaired relaxation).  Right Ventricle: The right ventricular size is normal. No increase in right ventricular wall thickness. Right ventricular systolic function is normal. Tricuspid regurgitation signal is inadequate for assessing PA pressure.  Left Atrium: Left atrial size was normal in size.  Right Atrium: Right atrial size was normal in size.  Pericardium: There is no evidence of pericardial effusion.  Mitral Valve: The mitral valve is grossly  normal. Trivial mitral valve regurgitation. No evidence of mitral valve stenosis.  Tricuspid Valve: The tricuspid valve is grossly normal. Tricuspid valve regurgitation is trivial. No evidence of tricuspid stenosis.  Aortic Valve: The aortic valve was not well visualized. Aortic valve regurgitation is not visualized. No aortic stenosis is present.  Pulmonic Valve: The pulmonic valve was not well visualized. Pulmonic valve regurgitation is not visualized.  Aorta: The aortic root is normal in size and structure.  Venous: The inferior vena cava is normal in size with greater than 50% respiratory variability, suggesting right atrial pressure of 3 mmHg.  IAS/Shunts: The atrial septum is grossly normal.   LEFT VENTRICLE PLAX 2D LVIDd:         6.00 cm   Diastology LVIDs:         4.20 cm   LV e' medial:    7.29 cm/s LV PW:         0.80 cm   LV E/e' medial:  12.0 LV IVS:        0.80 cm   LV e' lateral:   11.20 cm/s LVOT diam:     2.20 cm   LV E/e' lateral: 7.8  LV SV:         82 LV SV Index:   33 LVOT Area:     3.80 cm   RIGHT VENTRICLE             IVC RV Basal diam:  4.10 cm     IVC diam: 1.90 cm RV S prime:     12.15 cm/s TAPSE (M-mode): 2.1 cm  LEFT ATRIUM             Index        RIGHT ATRIUM           Index LA diam:        4.70 cm 1.88 cm/m   RA Area:     16.70 cm LA Vol (A2C):   47.7 ml 19.13 ml/m  RA Volume:   48.50 ml  19.45 ml/m LA Vol (A4C):   55.8 ml 22.38 ml/m LA Biplane Vol: 52.8 ml 21.17 ml/m AORTIC VALVE LVOT Vmax:   103.30 cm/s LVOT Vmean:  66.100 cm/s LVOT VTI:    0.216 m  AORTA Ao Root diam: 3.50 cm  MITRAL VALVE MV Area (PHT): 3.98 cm    SHUNTS MV Decel Time: 191 msec    Systemic VTI:  0.22 m MV E velocity: 87.50 cm/s  Systemic Diam: 2.20 cm MV A velocity: 53.75 cm/s MV E/A ratio:  1.63  Sunit Tolia Electronically signed by Madonna Large Signature Date/Time: 09/28/2023/1:44:07 PM    Final    MONITORS  LONG TERM MONITOR (3-14 DAYS)  11/30/2022  Narrative Mobile cardiac telemetry 14 days 11/03/2022 - 11/17/2022: Dominant rhythm: Sinus. HR 38-134 bpm. Avg HR 59 bpm, in sinus rhythm. 77 episodes of SVT/atrial tachycardia, fastest at 133 bpm for 4 beats, longest for 11.5 secs at 114 bpm. Some beats were conducted aberrantly. <1% isolated SVE. 4 episodes of VT, fastest at 200 bpm for 4 beats, longest for 8 beats at 137 bpm. 1.9% isolated VE, <1% couplet/triplets. No atrial fibrillation/atrial flutter/high grade AV block, sinus pause >3sec noted. 0 patient triggered events.     CARDIAC MRI  MR CARDIAC MORPHOLOGY W WO CONTRAST 01/05/2023  Narrative CLINICAL DATA:  Ischemic cardiomyopathy, assess EF.  EXAM: CARDIAC MRI  TECHNIQUE: The patient was scanned on a 1.5 Tesla GE magnet. A dedicated cardiac coil was used. Functional imaging was done using Fiesta sequences. 2,3, and 4 chamber views were done to assess for RWMA's. Modified Simpson's rule using a short axis stack was used to calculate an ejection fraction on a dedicated work [employer]. The patient received 8 cc of Gadavist . After 10 minutes inversion recovery sequences were used to assess for infiltration and scar tissue.  FINDINGS: Limited images of the lung fields showed a small pleural effusion on right with associated right basilar airspace disease, likely atelectasis.  Mildly dilated left ventricle with normal wall thickness. The mid to apical anteroseptal, inferoseptal and anterior wall segments, the apical inferior wall, and the true apex are akinetic. Overall LV EF 30%. No LV thrombus was seen. Normal right ventricular size with mildly decreased systolic function, RV EF 46%. Normal left and right atrial sizes. Trileaflet aortic valve with no stenosis or regurgitation. No significant mitral regurgitation noted.  DELAYED ENHANCEMENT IMAGING: DELAYED ENHANCEMENT IMAGING < 50% wall thickness subendocardial late gadolinium  enhancement (LGE) in the mid to apical anterior wall.  >50% wall thickness subendocardial LGE in the mid to apical anteroseptal and inferoseptal walls  >50% wall thickness subendocardial LGE at the true apex.  <  50% wall thickness subendocardial LGE in the apical inferior wall.  MEASUREMENTS: MEASUREMENTS LVEDV 257 mL  LVEDVi 99 mL/m2  LVSV 76 mL  LVEF 30%  RVEDV 154 mL  RVEDVi 59 mL/m2 RVSV 71 mL RVEF 46%  Aortic forward volume 88 mL  Aortic regurgitant fraction 4%  T1 1112, ECV 33%  IMPRESSION: 1. Mildly dilated LV with EF 30%. Regional wall motion abnormalities as noted above.  2.  Normal RV size with mildly decreased systolic function, EF 46%.  3. Delayed enhancement pattern suggestive of prior LAD-territory MI.  4. Mildly elevated extracellular volume percentage at 33%, suggesting increased myocardial fibrotic content (consistent with scar from prior MI).  Jonathon Cain   Electronically Signed By: Ezra Shuck M.D. On: 01/06/2023 16:39   ______________________________________________________________________________________________      Risk Assessment/Calculations           Physical Exam VS:  BP (!) 80/52   Pulse 61   Ht 5' 11 (1.803 m)   Wt 299 lb 6.4 oz (135.8 kg)   SpO2 95%   BMI 41.76 kg/m        Wt Readings from Last 3 Encounters:  01/18/24 299 lb 6.4 oz (135.8 kg)  01/06/24 299 lb (135.6 kg)  01/06/24 299 lb 9.6 oz (135.9 kg)    GEN: Well nourished, well developed in no acute distress NECK: No JVD; No carotid bruits CARDIAC: RRR, no murmurs, rubs, gallops RESPIRATORY:  Clear to auscultation without rales, wheezing or rhonchi  ABDOMEN: Soft, non-tender, non-distended EXTREMITIES:  No edema; No deformity   ASSESSMENT AND PLAN CAD - Stable with no anginal symptoms. No indication for ischemic evaluation.  Continue aspirin  81 mg daily, Lipitor  80 mg daily, Imdur  15 mg daily, metoprolol  succinate 12.5 mg daily.  Ischemic  cardiomyopathy -NYHA class II for fatigue, he is euvolemic.  Currently on all GDMT continue Jardiance  10 mg daily, metoprolol  succinate 12.5 mg daily, Entresto  49-51 mg twice daily, spironolactone  25 mg daily.  Currently taking Lasix  40 mg every other day--we will discontinue this and have him take it only as needed for abdominal swelling.  We initially discussed decreasing his Entresto  as his blood pressure is low however he states the variations in his blood pressure are unchanged and he is completely asymptomatic outside for fatigue which has been his baseline.  He mentions if he goes and drinks a cup of water his blood pressure will return to normal.  His skin is warm and dry, he ambulates with a cane and overall looks well.  Hypotension - secondary to medications, asymptomatic, taking lasix  every other day and will change to only as needed.  PAD - on Xarelto  2.5 mg daily.        Dispo: Change lasix  to only as needed, keep follow up with Dr. Elmira.   Signed, Delon JAYSON Hoover, NP  "

## 2024-01-18 ENCOUNTER — Encounter: Payer: Self-pay | Admitting: Cardiology

## 2024-01-18 ENCOUNTER — Ambulatory Visit: Payer: Medicare (Managed Care) | Attending: Cardiology | Admitting: Cardiology

## 2024-01-18 VITALS — BP 80/52 | HR 61 | Ht 71.0 in | Wt 299.4 lb

## 2024-01-18 DIAGNOSIS — I2510 Atherosclerotic heart disease of native coronary artery without angina pectoris: Secondary | ICD-10-CM | POA: Diagnosis not present

## 2024-01-18 DIAGNOSIS — I739 Peripheral vascular disease, unspecified: Secondary | ICD-10-CM

## 2024-01-18 DIAGNOSIS — I5020 Unspecified systolic (congestive) heart failure: Secondary | ICD-10-CM | POA: Diagnosis not present

## 2024-01-18 DIAGNOSIS — I255 Ischemic cardiomyopathy: Secondary | ICD-10-CM

## 2024-01-18 DIAGNOSIS — I952 Hypotension due to drugs: Secondary | ICD-10-CM

## 2024-01-18 MED ORDER — FUROSEMIDE 40 MG PO TABS: ORAL_TABLET | ORAL | 9 refills | Status: AC

## 2024-01-18 NOTE — Patient Instructions (Signed)
  Medication Instructions:  Take Lasix  as needed for swelling.  *If you need a refill on your cardiac medications before your next appointment, please call your pharmacy*  Lab Work: None  If you have labs (blood work) drawn today and your tests are completely normal, you will receive your results only by: MyChart Message (if you have MyChart) OR A paper copy in the mail If you have any lab test that is abnormal or we need to change your treatment, we will call you to review the results.  Testing/Procedures: None   Follow-Up:  As scheduled.  At [facility] you and your health needs are our priority.  As part of our continuing mission to provide you with exceptional heart care, our providers are all part of one team.  This team includes your primary Cardiologist (physician) and Advanced Practice Providers or APPs (Physician Assistants and Nurse Practitioners) who all work together to provide you with the care you need, when you need it.  Your next appointment:     Provider:   Newman JINNY Lawrence, MD    We recommend signing up for the patient portal called MyChart.  Sign up information is provided on this After Visit Summary.  MyChart is used to connect with patients for Virtual Visits (Telemedicine).  Patients are able to view lab/test results, encounter notes, upcoming appointments, etc.  Non-urgent messages can be sent to your provider as well.   To learn more about what you can do with MyChart, go to [email or web address].   Other Instructions None

## 2024-01-19 ENCOUNTER — Ambulatory Visit (INDEPENDENT_AMBULATORY_CARE_PROVIDER_SITE_OTHER): Payer: Medicare (Managed Care)

## 2024-01-19 DIAGNOSIS — I255 Ischemic cardiomyopathy: Secondary | ICD-10-CM | POA: Diagnosis not present

## 2024-01-20 LAB — CUP PACEART REMOTE DEVICE CHECK
Battery Remaining Longevity: 144 mo
Battery Remaining Percentage: 100 %
Brady Statistic RA Percent Paced: 89 %
Brady Statistic RV Percent Paced: 15 %
Date Time Interrogation Session: 20251029000100
HighPow Impedance: 74 Ohm
Implantable Lead Connection Status: 753985
Implantable Lead Connection Status: 753985
Implantable Lead Implant Date: 20250916
Implantable Lead Implant Date: 20250916
Implantable Lead Location: 753859
Implantable Lead Location: 753860
Implantable Lead Model: 673
Implantable Lead Model: 7841
Implantable Lead Serial Number: 1657605
Implantable Lead Serial Number: 280603
Implantable Pulse Generator Implant Date: 20250916
Lead Channel Impedance Value: 479 Ohm
Lead Channel Impedance Value: 669 Ohm
Lead Channel Pacing Threshold Amplitude: 0.4 V
Lead Channel Pacing Threshold Amplitude: 0.4 V
Lead Channel Pacing Threshold Pulse Width: 0.4 ms
Lead Channel Pacing Threshold Pulse Width: 0.4 ms
Lead Channel Setting Pacing Amplitude: 3.5 V
Lead Channel Setting Pacing Amplitude: 3.5 V
Lead Channel Setting Pacing Pulse Width: 0.4 ms
Lead Channel Setting Sensing Sensitivity: 0.5 mV
Pulse Gen Serial Number: 710861
Zone Setting Status: 755011

## 2024-01-23 ENCOUNTER — Ambulatory Visit: Payer: Self-pay | Admitting: Cardiology

## 2024-01-26 NOTE — Progress Notes (Signed)
 Remote ICD Transmission

## 2024-02-10 ENCOUNTER — Encounter: Payer: Self-pay | Admitting: Cardiology

## 2024-02-10 ENCOUNTER — Ambulatory Visit: Payer: Medicare (Managed Care) | Attending: Cardiology | Admitting: Cardiology

## 2024-02-10 ENCOUNTER — Other Ambulatory Visit (HOSPITAL_COMMUNITY): Payer: Self-pay

## 2024-02-10 VITALS — BP 86/54 | HR 60 | Ht 71.0 in | Wt 301.0 lb

## 2024-02-10 DIAGNOSIS — I2510 Atherosclerotic heart disease of native coronary artery without angina pectoris: Secondary | ICD-10-CM | POA: Diagnosis not present

## 2024-02-10 DIAGNOSIS — I5020 Unspecified systolic (congestive) heart failure: Secondary | ICD-10-CM

## 2024-02-10 DIAGNOSIS — R072 Precordial pain: Secondary | ICD-10-CM

## 2024-02-10 DIAGNOSIS — I739 Peripheral vascular disease, unspecified: Secondary | ICD-10-CM

## 2024-02-10 DIAGNOSIS — I255 Ischemic cardiomyopathy: Secondary | ICD-10-CM

## 2024-02-10 MED ORDER — SPIRONOLACTONE 25 MG PO TABS: 12.5000 mg | ORAL_TABLET | Freq: Every day | ORAL | Status: DC

## 2024-02-10 MED ORDER — SPIRONOLACTONE 25 MG PO TABS: 25.0000 mg | ORAL_TABLET | ORAL | Status: DC

## 2024-02-10 NOTE — Patient Instructions (Addendum)
 Medication Instructions:  Take Spironolactone  25 mg every other day   *If you need a refill on your cardiac medications before your next appointment, please call your pharmacy*  Follow-Up: At [facility], you and your health needs are our priority.  As part of our continuing mission to provide you with exceptional heart care, our providers are all part of one team.  This team includes your primary Cardiologist (physician) and Advanced Practice Providers or APPs (Physician Assistants and Nurse Practitioners) who all work together to provide you with the care you need, when you need it.  Your next appointment:   3 month(s)  Provider:   Newman JINNY Lawrence, MD

## 2024-02-10 NOTE — Progress Notes (Signed)
 Cardiology Office Note:  .   Date:  02/10/2024  ID:  Arley Lemond Breen, DOB 06-08-1960, MRN [id number] PCP: de Cuba, Raymond J, MD  [facility] Providers Cardiologist:  Newman Lawrence, MD PCP: de Cuba, Quintin PARAS, MD  Chief Complaint  Patient presents with   Cardiomyopathy     Shjon Lizarraga is a 63 y.o. male with hyperlipidemia, morbid obesity, legal blindness (due to retinitis pigmentosa), CAD, ishcemic cardiomyopathy s/p ICD placement  Patient is doing fairly well.  Since his last visit with me, he is not having ICD placement.  He has stable, unchanged, mild chronic chest pain with no recent change. Exertional dyspnea is stable, he denies any leg swelling. Blood pressure slightly lowest than usual, but he has not had much water to drink today. Usually, he drinks about 60-80 Oz of water everyday.     Vitals:   02/10/24 1152 02/10/24 1153  BP: (!) 80/52 (!) 86/54  Pulse:    SpO2:           Review of Systems  Cardiovascular:  Positive for chest pain and dyspnea on exertion (Improving). Negative for leg swelling, palpitations and syncope.        Studies Reviewed: SABRA       EKG 02/10/2024: AV dual-paced rhythm with prolonged AV conduction When compared with ECG of 08-Dec-2023 06:37,  ventricular pacing is more prominent    ICD check 01/2024: Remote Defibrillator interrogation reviewed. Presenting Rhythm:A-paced V-sensed. Battery and lead parameters stable with stable capture and sensing. Device programming is appropriate. No arrhythmias noted. Continue remote monitoring.   Echocardiogram 09/2023: 1. Left ventricular ejection fraction, by estimation, is 35 to 40%. The  left ventricle has moderately decreased function. The left ventricle  demonstrates global hypokinesis. The left ventricular internal cavity size  was mildly dilated. Left ventricular  diastolic parameters are consistent with Grade I diastolic dysfunction  (impaired relaxation).   2. Right  ventricular systolic function is normal. The right ventricular  size is normal. Tricuspid regurgitation signal is inadequate for assessing  PA pressure.   3. The mitral valve is grossly normal. Trivial mitral valve  regurgitation. No evidence of mitral stenosis.   4. The aortic valve was not well visualized. Aortic valve regurgitation  is not visualized. No aortic stenosis is present.   5. The inferior vena cava is normal in size with greater than 50%  respiratory variability, suggesting right atrial pressure of 3 mmHg.   Comparison(s): A prior study was performed on 04/28/2023. LV cavity is  less dilated, Grade III diastolic dysfunction is now Grade I. Recommend  cMRI or different imaging modality to accurately evaluate the LVEF.   Conclusion(s)/Recommendation(s): No left ventricular mural or apical  thrombus/thrombi.   Labs 11/2023: Hb 15.2 Cr 1.24, eGFR 65 proBNP 415  Labs 09/2023: AlKP 177 Hb 15.1  Labs 06/2023: Cr 1.04 BNP 168  03/2023: Chol 115, TG 121, HDL 33, LDL 60 HbA1C 5.6% Hb 15.3 Cr 1.05 BNP 320 TSH 3.5  Physical Exam Vitals and nursing note reviewed.  Constitutional:      General: He is not in acute distress. Neck:     Vascular: No JVD.  Cardiovascular:     Rate and Rhythm: Normal rate and regular rhythm.     Heart sounds: Normal heart sounds. No murmur heard. Pulmonary:     Effort: Pulmonary effort is normal.     Breath sounds: Normal breath sounds. No wheezing or rales.  Musculoskeletal:     Right lower  leg: No edema.     Left lower leg: No edema.      VISIT DIAGNOSES:   ICD-10-CM   1. Coronary artery disease involving native coronary artery of native heart without angina pectoris  I25.10 EKG 12-Lead    2. Ischemic cardiomyopathy  I25.5 EKG 12-Lead    3. HFrEF (heart failure with reduced ejection fraction) (HCC)  I50.20 EKG 12-Lead    4. PAD (peripheral artery disease)  I73.9 EKG 12-Lead    5. Precordial pain  R07.2         Emmanuelle Coxe is a 63 y.o. male with hyperlipidemia, morbid obesity, legal blindness (due to retinitis pigmentosa), CAD, ishcemic cardiomyopathy s/p ICD placement  Assessment & Plan  CAD: Cath 08/2021 showed patent LAD stent, moderate circumflex/RCA disease with negative FFR Continue aspirin , statin, Imdur  15 mg daily, metoprolol  succiante 25 mg daily.   Ischemic cardiomyopathy: EF 35-50% (echocardiogram 11/2023) Nonobstructive CAD, patent LAD stent (08/2021) NYHA class II symptoms. Currently on spironolactone  25 mg daily, Entresto  to 49-51 mg bid, Jardiance  10 mg daily, metoprolol  succinate 25 mg daily, lasix  40 mg daily. Blood pressure lower than usual today. I intended to reduce spironolactone  from 25 mg daily to 12.5 mg daily.  Patient is legally blind.  He was to get pill packs from his pharmacy, but that services stopped.  Will try to check with her pharmacy if spironolactone  can be given to continue off.  However, there is no permissible.  Therefore, to avoid difficulty with administration, instead asked the patient to take spironolactone  25 mg every other day.  PAD: Successful intervention to b/l CIA and Rt SFA for severe caludication (02/2020) Successful intervention to Lt SFA ABI 0.8 b/l. Residual mild below the knee disease (08/2021) No current claudication symptoms.   Continue Aspirin  81 mg daily, Xarelto  2.5 mg bid  F/u in 3 months  Signed, Newman JINNY Lawrence, MD

## 2024-02-11 ENCOUNTER — Other Ambulatory Visit: Payer: Self-pay | Admitting: Cardiology

## 2024-02-11 ENCOUNTER — Encounter: Payer: Self-pay | Admitting: Cardiology

## 2024-02-11 DIAGNOSIS — I255 Ischemic cardiomyopathy: Secondary | ICD-10-CM | POA: Insufficient documentation

## 2024-02-11 DIAGNOSIS — R072 Precordial pain: Secondary | ICD-10-CM | POA: Insufficient documentation

## 2024-02-15 ENCOUNTER — Other Ambulatory Visit (HOSPITAL_COMMUNITY): Payer: Self-pay

## 2024-02-15 DIAGNOSIS — I5022 Chronic systolic (congestive) heart failure: Secondary | ICD-10-CM

## 2024-02-15 MED ORDER — METOPROLOL SUCCINATE ER 25 MG PO TB24: 12.5000 mg | ORAL_TABLET | Freq: Every evening | ORAL | 1 refills | Status: AC

## 2024-02-21 ENCOUNTER — Other Ambulatory Visit: Payer: Self-pay | Admitting: Cardiology

## 2024-02-25 MED ORDER — EMPAGLIFLOZIN 10 MG PO TABS: 10.0000 mg | ORAL_TABLET | Freq: Every day | ORAL | 3 refills | Status: AC

## 2024-03-07 ENCOUNTER — Other Ambulatory Visit: Payer: Self-pay | Admitting: Cardiology

## 2024-03-07 DIAGNOSIS — I25118 Atherosclerotic heart disease of native coronary artery with other forms of angina pectoris: Secondary | ICD-10-CM

## 2024-03-07 DIAGNOSIS — I739 Peripheral vascular disease, unspecified: Secondary | ICD-10-CM

## 2024-03-07 NOTE — Progress Notes (Unsigned)
°  Electrophysiology Office Note:   Date:  03/08/2024  ID:  Jahki, Witham 27-May-1960, MRN [id number]  Primary Cardiologist: Newman JINNY Lawrence, MD Primary Heart Failure: None Electrophysiologist: Fonda Kitty, MD       History of Present Illness:   Jonathon Cain is a 63 y.o. 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  Since last being seen in our clinic the patient reports doing ok - his mother just passed away which has been difficult. His device site healed up and he has not specific device related concerns.      He denies chest pain, palpitations, dyspnea, PND, orthopnea, nausea, vomiting, dizziness, syncope, edema, weight gain, or early satiety.    Review of systems complete and found to be negative unless listed in HPI.    EP Information / Studies Reviewed:    EKG is ordered today. Personal review as below.  EKG Interpretation Date/Time:  Wednesday March 08 2024 14:21:29 EST Ventricular Rate:  59 PR Interval:  272 QRS Duration:  118 QT Interval:  444 QTC Calculation: 439 R Axis:   -83  Text Interpretation: Atrial-paced rhythm with prolonged AV conduction Confirmed by Aniceto Jarvis ([id number]) on 03/08/2024 2:39:50 PM   ICD Interrogation-  reviewed in detail today,  See PACEART report.  Device History: [employer] ICD implanted 12/07/23 for ICM History of appropriate therapy: No History of AAD therapy: No   Risk Assessment/Calculations:              Physical Exam:   VS:  BP (!) 90/58   Pulse (!) 59   Ht 5' 11 (1.803 m)   Wt 298 lb (135.2 kg)   SpO2 99%   BMI 41.56 kg/m    Wt Readings from Last 3 Encounters:  03/08/24 298 lb (135.2 kg)  02/10/24 (!) 301 lb (136.5 kg)  01/18/24 299 lb 6.4 oz (135.8 kg)     GEN:  Well nourished, well developed in no acute distress NECK: No JVD; No carotid bruits CARDIAC: Regular rate and rhythm, no murmurs, rubs, gallops, device site well healed, no tethering  RESPIRATORY:  Clear to auscultation without rales, wheezing or rhonchi  ABDOMEN: Soft, non-tender, non-distended EXTREMITIES:  No edema; No deformity   ASSESSMENT AND PLAN:    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 Lawrence -Normal ICD function -See Pace Art report -No changes today -one AT/AF episode that appears to be 1:1 / AT   CAD s/p LAD PCI  -no anginal symptoms     Hypertension  -well controlled on current regimen   Disposition:   Follow up with Dr. Kitty in 12 months   Signed, Jarvis Aniceto, NP-C, AGACNP-BC  [facility] - Elect[facility]iology  03/08/2024, 5:25 PM

## 2024-03-08 ENCOUNTER — Encounter: Payer: Self-pay | Admitting: Pulmonary Disease

## 2024-03-08 ENCOUNTER — Ambulatory Visit: Payer: Medicare (Managed Care) | Attending: Pulmonary Disease | Admitting: Pulmonary Disease

## 2024-03-08 VITALS — BP 90/58 | HR 59 | Ht 71.0 in | Wt 298.0 lb

## 2024-03-08 DIAGNOSIS — I255 Ischemic cardiomyopathy: Secondary | ICD-10-CM

## 2024-03-08 DIAGNOSIS — I25118 Atherosclerotic heart disease of native coronary artery with other forms of angina pectoris: Secondary | ICD-10-CM

## 2024-03-08 DIAGNOSIS — I5022 Chronic systolic (congestive) heart failure: Secondary | ICD-10-CM

## 2024-03-08 LAB — CUP PACEART INCLINIC DEVICE CHECK
Date Time Interrogation Session: 20251217172211
Implantable Lead Connection Status: 753985
Implantable Lead Connection Status: 753985
Implantable Lead Implant Date: 20250916
Implantable Lead Implant Date: 20250916
Implantable Lead Location: 753859
Implantable Lead Location: 753860
Implantable Lead Model: 673
Implantable Lead Model: 7841
Implantable Lead Serial Number: 1657605
Implantable Lead Serial Number: 280603
Implantable Pulse Generator Implant Date: 20250916
Lead Channel Setting Pacing Amplitude: 2.5 V
Lead Channel Setting Pacing Amplitude: 2.5 V
Lead Channel Setting Pacing Pulse Width: 0.4 ms
Lead Channel Setting Sensing Sensitivity: 0.5 mV
Pulse Gen Serial Number: 710861
Zone Setting Status: 755011

## 2024-03-08 NOTE — Patient Instructions (Signed)
 Medication Instructions:  Your physician recommends that you continue on your current medications as directed. Please refer to the Current Medication list given to you today.  *If you need a refill on your cardiac medications before your next appointment, please call your pharmacy*  Lab Work: None ordered If you have labs (blood work) drawn today and your tests are completely normal, you will receive your results only by: MyChart Message (if you have MyChart) OR A paper copy in the mail If you have any lab test that is abnormal or we need to change your treatment, we will call you to review the results.  Follow-Up: At [facility], you and your health needs are our priority.  As part of our continuing mission to provide you with exceptional heart care, our providers are all part of one team.  This team includes your primary Cardiologist (physician) and Advanced Practice Providers or APPs (Physician Assistants and Nurse Practitioners) who all work together to provide you with the care you need, when you need it.  Your next appointment:   1 year(s)  Provider:   Daphne Barrack, NP

## 2024-03-09 ENCOUNTER — Ambulatory Visit: Payer: Self-pay | Admitting: Cardiology

## 2024-03-25 ENCOUNTER — Other Ambulatory Visit: Payer: Self-pay | Admitting: Cardiology

## 2024-03-25 DIAGNOSIS — N320 Bladder-neck obstruction: Secondary | ICD-10-CM

## 2024-03-27 ENCOUNTER — Other Ambulatory Visit: Payer: Self-pay | Admitting: Cardiology

## 2024-03-27 DIAGNOSIS — N320 Bladder-neck obstruction: Secondary | ICD-10-CM

## 2024-03-27 NOTE — Telephone Encounter (Signed)
 Pt of Dr. Elmira. Does Dr. Elmira want to refill this RX? Please advise.

## 2024-03-28 DIAGNOSIS — N320 Bladder-neck obstruction: Secondary | ICD-10-CM

## 2024-03-28 MED ORDER — TAMSULOSIN HCL 0.4 MG PO CAPS: 0.4000 mg | ORAL_CAPSULE | Freq: Every day | ORAL | 1 refills | Status: AC | PRN

## 2024-03-28 NOTE — Telephone Encounter (Signed)
 I think I refilled this once in the past and patient requested during office visit, but ideally would defer this to patient's PCP or urology.  Thanks MJP

## 2024-04-03 ENCOUNTER — Other Ambulatory Visit: Payer: Self-pay

## 2024-04-03 ENCOUNTER — Other Ambulatory Visit (HOSPITAL_COMMUNITY): Payer: Self-pay | Admitting: Cardiology

## 2024-04-03 MED ORDER — SPIRONOLACTONE 25 MG PO TABS: 25.0000 mg | ORAL_TABLET | ORAL | 3 refills | Status: AC

## 2024-04-03 MED ORDER — ISOSORBIDE MONONITRATE ER 30 MG PO TB24: 15.0000 mg | ORAL_TABLET | Freq: Every day | ORAL | 8 refills | Status: AC

## 2024-04-19 ENCOUNTER — Ambulatory Visit: Payer: Medicare (Managed Care)

## 2024-04-19 DIAGNOSIS — I255 Ischemic cardiomyopathy: Secondary | ICD-10-CM

## 2024-04-19 LAB — CUP PACEART REMOTE DEVICE CHECK
Battery Remaining Longevity: 156 mo
Battery Remaining Percentage: 100 %
Brady Statistic RA Percent Paced: 84 %
Brady Statistic RV Percent Paced: 8 %
Date Time Interrogation Session: 20260128000200
HighPow Impedance: 78 Ohm
Implantable Lead Connection Status: 753985
Implantable Lead Connection Status: 753985
Implantable Lead Implant Date: 20250916
Implantable Lead Implant Date: 20250916
Implantable Lead Location: 753859
Implantable Lead Location: 753860
Implantable Lead Model: 673
Implantable Lead Model: 7841
Implantable Lead Serial Number: 1657605
Implantable Lead Serial Number: 280603
Implantable Pulse Generator Implant Date: 20250916
Lead Channel Impedance Value: 427 Ohm
Lead Channel Impedance Value: 689 Ohm
Lead Channel Pacing Threshold Amplitude: 0.5 V
Lead Channel Pacing Threshold Amplitude: 0.6 V
Lead Channel Pacing Threshold Pulse Width: 0.4 ms
Lead Channel Pacing Threshold Pulse Width: 0.4 ms
Lead Channel Setting Pacing Amplitude: 2.5 V
Lead Channel Setting Pacing Amplitude: 2.5 V
Lead Channel Setting Pacing Pulse Width: 0.4 ms
Lead Channel Setting Sensing Sensitivity: 0.5 mV
Pulse Gen Serial Number: 710861
Zone Setting Status: 755011

## 2024-04-23 ENCOUNTER — Ambulatory Visit: Payer: Self-pay | Admitting: Cardiology

## 2024-04-24 ENCOUNTER — Ambulatory Visit (HOSPITAL_BASED_OUTPATIENT_CLINIC_OR_DEPARTMENT_OTHER): Payer: Medicare (Managed Care) | Admitting: Family Medicine

## 2024-04-27 NOTE — Progress Notes (Signed)
 Remote ICD Transmission

## 2024-05-17 ENCOUNTER — Ambulatory Visit: Payer: Medicare (Managed Care) | Admitting: Cardiology

## 2024-05-30 ENCOUNTER — Encounter (HOSPITAL_BASED_OUTPATIENT_CLINIC_OR_DEPARTMENT_OTHER): Payer: Medicare (Managed Care)
